# Patient Record
Sex: Female | Born: 1975 | Race: White | Hispanic: No | Marital: Single | Smoking: Current every day smoker
Health system: Southern US, Community
[De-identification: ages and names within clinical notes are randomized; demographics above are authoritative.]

## PROBLEM LIST (undated history)

## (undated) DIAGNOSIS — F32A Depression, unspecified: Secondary | ICD-10-CM

## (undated) DIAGNOSIS — F17210 Nicotine dependence, cigarettes, uncomplicated: Secondary | ICD-10-CM

## (undated) DIAGNOSIS — F329 Major depressive disorder, single episode, unspecified: Secondary | ICD-10-CM

## (undated) DIAGNOSIS — F419 Anxiety disorder, unspecified: Secondary | ICD-10-CM

## (undated) DIAGNOSIS — F319 Bipolar disorder, unspecified: Secondary | ICD-10-CM

## (undated) DIAGNOSIS — N390 Urinary tract infection, site not specified: Secondary | ICD-10-CM

## (undated) DIAGNOSIS — K529 Noninfective gastroenteritis and colitis, unspecified: Secondary | ICD-10-CM

## (undated) DIAGNOSIS — E785 Hyperlipidemia, unspecified: Secondary | ICD-10-CM

## (undated) DIAGNOSIS — B1920 Unspecified viral hepatitis C without hepatic coma: Secondary | ICD-10-CM

## (undated) DIAGNOSIS — J40 Bronchitis, not specified as acute or chronic: Secondary | ICD-10-CM

## (undated) DIAGNOSIS — J42 Unspecified chronic bronchitis: Secondary | ICD-10-CM

## (undated) DIAGNOSIS — T1491XA Suicide attempt, initial encounter: Secondary | ICD-10-CM

## (undated) DIAGNOSIS — R51 Headache: Secondary | ICD-10-CM

## (undated) DIAGNOSIS — N289 Disorder of kidney and ureter, unspecified: Secondary | ICD-10-CM

## (undated) DIAGNOSIS — N809 Endometriosis, unspecified: Secondary | ICD-10-CM

## (undated) DIAGNOSIS — K219 Gastro-esophageal reflux disease without esophagitis: Secondary | ICD-10-CM

---

## 1998-07-19 HISTORY — PX: CHOLECYSTECTOMY: SHX55

## 1998-10-29 ENCOUNTER — Inpatient Hospital Stay (HOSPITAL_COMMUNITY): Admission: AD | Admit: 1998-10-29 | Discharge: 1998-10-29 | Payer: Self-pay | Admitting: Obstetrics

## 1998-12-09 ENCOUNTER — Other Ambulatory Visit: Admission: RE | Admit: 1998-12-09 | Discharge: 1998-12-09 | Payer: Self-pay | Admitting: Obstetrics and Gynecology

## 1999-01-16 ENCOUNTER — Ambulatory Visit (HOSPITAL_COMMUNITY): Admission: RE | Admit: 1999-01-16 | Discharge: 1999-01-16 | Payer: Self-pay | Admitting: Obstetrics and Gynecology

## 1999-01-16 ENCOUNTER — Encounter: Payer: Self-pay | Admitting: Obstetrics and Gynecology

## 1999-05-31 ENCOUNTER — Inpatient Hospital Stay (HOSPITAL_COMMUNITY): Admission: AD | Admit: 1999-05-31 | Discharge: 1999-06-02 | Payer: Self-pay | Admitting: Obstetrics and Gynecology

## 1999-06-02 ENCOUNTER — Encounter: Admission: RE | Admit: 1999-06-02 | Discharge: 1999-08-31 | Payer: Self-pay | Admitting: Obstetrics and Gynecology

## 1999-06-29 ENCOUNTER — Emergency Department (HOSPITAL_COMMUNITY): Admission: EM | Admit: 1999-06-29 | Discharge: 1999-06-29 | Payer: Self-pay | Admitting: Emergency Medicine

## 1999-08-15 ENCOUNTER — Emergency Department (HOSPITAL_COMMUNITY): Admission: EM | Admit: 1999-08-15 | Discharge: 1999-08-15 | Payer: Self-pay | Admitting: Emergency Medicine

## 2002-01-14 ENCOUNTER — Emergency Department (HOSPITAL_COMMUNITY): Admission: EM | Admit: 2002-01-14 | Discharge: 2002-01-14 | Payer: Self-pay | Admitting: *Deleted

## 2004-04-06 ENCOUNTER — Other Ambulatory Visit: Admission: RE | Admit: 2004-04-06 | Discharge: 2004-04-06 | Payer: Self-pay | Admitting: Obstetrics and Gynecology

## 2004-09-24 ENCOUNTER — Inpatient Hospital Stay (HOSPITAL_COMMUNITY): Admission: AD | Admit: 2004-09-24 | Discharge: 2004-09-25 | Payer: Self-pay | Admitting: Obstetrics and Gynecology

## 2005-06-01 ENCOUNTER — Other Ambulatory Visit: Admission: RE | Admit: 2005-06-01 | Discharge: 2005-06-01 | Payer: Self-pay | Admitting: Obstetrics and Gynecology

## 2005-08-24 ENCOUNTER — Emergency Department (HOSPITAL_COMMUNITY): Admission: EM | Admit: 2005-08-24 | Discharge: 2005-08-25 | Payer: Self-pay | Admitting: Emergency Medicine

## 2005-08-30 ENCOUNTER — Ambulatory Visit (HOSPITAL_COMMUNITY): Admission: RE | Admit: 2005-08-30 | Discharge: 2005-08-31 | Payer: Self-pay | Admitting: Orthopaedic Surgery

## 2006-07-19 HISTORY — PX: WRIST SURGERY: SHX841

## 2006-08-09 ENCOUNTER — Inpatient Hospital Stay (HOSPITAL_COMMUNITY): Admission: EM | Admit: 2006-08-09 | Discharge: 2006-08-10 | Payer: Self-pay | Admitting: Emergency Medicine

## 2006-10-04 ENCOUNTER — Encounter: Payer: Self-pay | Admitting: Emergency Medicine

## 2006-10-05 ENCOUNTER — Inpatient Hospital Stay (HOSPITAL_COMMUNITY): Admission: EM | Admit: 2006-10-05 | Discharge: 2006-10-10 | Payer: Self-pay | Admitting: Psychiatry

## 2006-10-05 ENCOUNTER — Ambulatory Visit: Payer: Self-pay | Admitting: Psychiatry

## 2006-11-03 ENCOUNTER — Ambulatory Visit (HOSPITAL_COMMUNITY): Admission: RE | Admit: 2006-11-03 | Discharge: 2006-11-03 | Payer: Self-pay | Admitting: Internal Medicine

## 2006-11-23 ENCOUNTER — Inpatient Hospital Stay (HOSPITAL_COMMUNITY): Admission: RE | Admit: 2006-11-23 | Discharge: 2006-11-25 | Payer: Self-pay | Admitting: Surgery

## 2006-11-23 ENCOUNTER — Encounter (INDEPENDENT_AMBULATORY_CARE_PROVIDER_SITE_OTHER): Payer: Self-pay | Admitting: Specialist

## 2006-12-22 ENCOUNTER — Other Ambulatory Visit: Payer: Self-pay

## 2006-12-22 ENCOUNTER — Other Ambulatory Visit: Payer: Self-pay | Admitting: *Deleted

## 2006-12-23 ENCOUNTER — Inpatient Hospital Stay (HOSPITAL_COMMUNITY): Admission: AD | Admit: 2006-12-23 | Discharge: 2006-12-29 | Payer: Self-pay | Admitting: Psychiatry

## 2006-12-23 ENCOUNTER — Ambulatory Visit: Payer: Self-pay | Admitting: Psychiatry

## 2006-12-29 ENCOUNTER — Emergency Department (HOSPITAL_COMMUNITY): Admission: EM | Admit: 2006-12-29 | Discharge: 2006-12-30 | Payer: Self-pay | Admitting: Emergency Medicine

## 2007-03-24 ENCOUNTER — Emergency Department (HOSPITAL_COMMUNITY): Admission: EM | Admit: 2007-03-24 | Discharge: 2007-03-24 | Payer: Self-pay | Admitting: Family Medicine

## 2007-08-02 ENCOUNTER — Emergency Department (HOSPITAL_COMMUNITY): Admission: EM | Admit: 2007-08-02 | Discharge: 2007-08-02 | Payer: Self-pay | Admitting: Emergency Medicine

## 2007-08-29 ENCOUNTER — Encounter: Payer: Self-pay | Admitting: Emergency Medicine

## 2007-08-30 ENCOUNTER — Ambulatory Visit: Payer: Self-pay | Admitting: *Deleted

## 2007-08-30 ENCOUNTER — Inpatient Hospital Stay (HOSPITAL_COMMUNITY): Admission: AD | Admit: 2007-08-30 | Discharge: 2007-09-06 | Payer: Self-pay | Admitting: *Deleted

## 2008-01-21 ENCOUNTER — Emergency Department (HOSPITAL_COMMUNITY): Admission: EM | Admit: 2008-01-21 | Discharge: 2008-01-21 | Payer: Self-pay | Admitting: *Deleted

## 2008-02-23 ENCOUNTER — Emergency Department (HOSPITAL_COMMUNITY): Admission: EM | Admit: 2008-02-23 | Discharge: 2008-02-23 | Payer: Self-pay | Admitting: Emergency Medicine

## 2008-03-08 ENCOUNTER — Inpatient Hospital Stay (HOSPITAL_COMMUNITY): Admission: EM | Admit: 2008-03-08 | Discharge: 2008-03-10 | Payer: Self-pay | Admitting: Emergency Medicine

## 2008-03-09 ENCOUNTER — Ambulatory Visit: Payer: Self-pay | Admitting: Psychiatry

## 2008-03-10 ENCOUNTER — Inpatient Hospital Stay (HOSPITAL_COMMUNITY): Admission: EM | Admit: 2008-03-10 | Discharge: 2008-03-14 | Payer: Self-pay | Admitting: *Deleted

## 2008-03-10 ENCOUNTER — Ambulatory Visit: Payer: Self-pay | Admitting: *Deleted

## 2008-03-22 ENCOUNTER — Observation Stay (HOSPITAL_COMMUNITY): Admission: EM | Admit: 2008-03-22 | Discharge: 2008-03-24 | Payer: Self-pay | Admitting: Emergency Medicine

## 2008-08-01 ENCOUNTER — Inpatient Hospital Stay (HOSPITAL_COMMUNITY): Admission: AD | Admit: 2008-08-01 | Discharge: 2008-08-05 | Payer: Self-pay | Admitting: *Deleted

## 2008-08-01 ENCOUNTER — Ambulatory Visit: Payer: Self-pay | Admitting: *Deleted

## 2008-11-08 ENCOUNTER — Emergency Department (HOSPITAL_COMMUNITY): Admission: EM | Admit: 2008-11-08 | Discharge: 2008-11-08 | Payer: Self-pay | Admitting: Emergency Medicine

## 2008-11-09 ENCOUNTER — Emergency Department (HOSPITAL_COMMUNITY): Admission: EM | Admit: 2008-11-09 | Discharge: 2008-11-10 | Payer: Self-pay | Admitting: Emergency Medicine

## 2009-01-06 ENCOUNTER — Emergency Department (HOSPITAL_COMMUNITY): Admission: EM | Admit: 2009-01-06 | Discharge: 2009-01-06 | Payer: Self-pay | Admitting: Emergency Medicine

## 2009-05-15 ENCOUNTER — Emergency Department (HOSPITAL_COMMUNITY): Admission: EM | Admit: 2009-05-15 | Discharge: 2009-05-15 | Payer: Self-pay | Admitting: Emergency Medicine

## 2009-06-07 ENCOUNTER — Emergency Department (HOSPITAL_COMMUNITY): Admission: EM | Admit: 2009-06-07 | Discharge: 2009-06-07 | Payer: Self-pay | Admitting: Emergency Medicine

## 2009-07-10 ENCOUNTER — Emergency Department (HOSPITAL_COMMUNITY): Admission: EM | Admit: 2009-07-10 | Discharge: 2009-07-10 | Payer: Self-pay | Admitting: Emergency Medicine

## 2009-08-06 ENCOUNTER — Encounter: Payer: Self-pay | Admitting: Emergency Medicine

## 2009-08-07 ENCOUNTER — Ambulatory Visit: Payer: Self-pay | Admitting: Psychiatry

## 2009-08-07 ENCOUNTER — Inpatient Hospital Stay (HOSPITAL_COMMUNITY): Admission: RE | Admit: 2009-08-07 | Discharge: 2009-08-11 | Payer: Self-pay | Admitting: Psychiatry

## 2009-09-20 ENCOUNTER — Emergency Department (HOSPITAL_COMMUNITY): Admission: EM | Admit: 2009-09-20 | Discharge: 2009-09-20 | Payer: Self-pay | Admitting: Emergency Medicine

## 2009-09-27 ENCOUNTER — Emergency Department (HOSPITAL_COMMUNITY): Admission: EM | Admit: 2009-09-27 | Discharge: 2009-09-27 | Payer: Self-pay | Admitting: Family Medicine

## 2010-03-18 ENCOUNTER — Emergency Department (HOSPITAL_COMMUNITY): Admission: EM | Admit: 2010-03-18 | Discharge: 2010-03-18 | Payer: Self-pay | Admitting: Emergency Medicine

## 2010-05-22 ENCOUNTER — Emergency Department (HOSPITAL_COMMUNITY): Admission: EM | Admit: 2010-05-22 | Discharge: 2010-05-22 | Payer: Self-pay | Admitting: Emergency Medicine

## 2010-06-03 ENCOUNTER — Ambulatory Visit (HOSPITAL_COMMUNITY)
Admission: RE | Admit: 2010-06-03 | Discharge: 2010-06-03 | Payer: Self-pay | Source: Home / Self Care | Admitting: Psychiatry

## 2010-06-03 ENCOUNTER — Emergency Department (HOSPITAL_COMMUNITY): Admission: EM | Admit: 2010-06-03 | Discharge: 2010-06-03 | Payer: Self-pay | Admitting: Emergency Medicine

## 2010-10-02 LAB — DIFFERENTIAL
Lymphocytes Relative: 34 % (ref 12–46)
Lymphs Abs: 2.4 10*3/uL (ref 0.7–4.0)
Monocytes Relative: 7 % (ref 3–12)
Neutro Abs: 4.1 10*3/uL (ref 1.7–7.7)
Neutrophils Relative %: 56 % (ref 43–77)

## 2010-10-02 LAB — URINALYSIS, ROUTINE W REFLEX MICROSCOPIC
Bilirubin Urine: NEGATIVE
Nitrite: NEGATIVE
Specific Gravity, Urine: 1.006 (ref 1.005–1.030)
pH: 7 (ref 5.0–8.0)

## 2010-10-02 LAB — CBC
Hemoglobin: 13.4 g/dL (ref 12.0–15.0)
RBC: 4.43 MIL/uL (ref 3.87–5.11)
WBC: 7.2 10*3/uL (ref 4.0–10.5)

## 2010-10-04 LAB — PREGNANCY, URINE: Preg Test, Ur: NEGATIVE

## 2010-10-04 LAB — URINE DRUGS OF ABUSE SCREEN W ALC, ROUTINE (REF LAB)
Benzodiazepines.: POSITIVE — AB
Cocaine Metabolites: NEGATIVE
Ethyl Alcohol: <10 mg/dL (ref <10)
Phencyclidine (PCP): NEGATIVE
Propoxyphene: NEGATIVE

## 2010-10-04 LAB — URINALYSIS, ROUTINE W REFLEX MICROSCOPIC
Bilirubin Urine: NEGATIVE
Glucose, UA: NEGATIVE mg/dL
Ketones, ur: NEGATIVE mg/dL
Nitrite: NEGATIVE
Protein, ur: NEGATIVE mg/dL

## 2010-10-04 LAB — BENZODIAZEPINE, QUANTITATIVE, URINE
Alprazolam (GC/LC/MS), ur confirm: 3900 ng/mL
Flurazepam GC/MS Conf: NEGATIVE
Nordiazepam GC/MS Conf: NEGATIVE

## 2010-10-16 ENCOUNTER — Emergency Department (HOSPITAL_COMMUNITY): Payer: Self-pay

## 2010-10-16 ENCOUNTER — Emergency Department (HOSPITAL_COMMUNITY)
Admission: EM | Admit: 2010-10-16 | Discharge: 2010-10-17 | Disposition: A | Discharge status: Home / Self Care | Payer: Self-pay | Attending: Emergency Medicine | Admitting: Emergency Medicine

## 2010-10-16 DIAGNOSIS — R0602 Shortness of breath: Secondary | ICD-10-CM | POA: Insufficient documentation

## 2010-10-16 DIAGNOSIS — F341 Dysthymic disorder: Secondary | ICD-10-CM | POA: Insufficient documentation

## 2010-10-16 DIAGNOSIS — R45851 Suicidal ideations: Secondary | ICD-10-CM | POA: Insufficient documentation

## 2010-10-16 DIAGNOSIS — R079 Chest pain, unspecified: Secondary | ICD-10-CM | POA: Insufficient documentation

## 2010-10-16 DIAGNOSIS — I10 Essential (primary) hypertension: Secondary | ICD-10-CM | POA: Insufficient documentation

## 2010-10-16 DIAGNOSIS — F411 Generalized anxiety disorder: Secondary | ICD-10-CM | POA: Insufficient documentation

## 2010-10-16 DIAGNOSIS — R000 Tachycardia, unspecified: Secondary | ICD-10-CM | POA: Insufficient documentation

## 2010-10-16 DIAGNOSIS — R209 Unspecified disturbances of skin sensation: Secondary | ICD-10-CM | POA: Insufficient documentation

## 2010-10-16 DIAGNOSIS — R109 Unspecified abdominal pain: Secondary | ICD-10-CM | POA: Insufficient documentation

## 2010-10-16 LAB — RAPID URINE DRUG SCREEN, HOSP PERFORMED
Barbiturates: NOT DETECTED
Cocaine: NOT DETECTED
Opiates: NOT DETECTED
Tetrahydrocannabinol: NOT DETECTED

## 2010-10-16 LAB — URINALYSIS, ROUTINE W REFLEX MICROSCOPIC
Bilirubin Urine: NEGATIVE
Glucose, UA: NEGATIVE mg/dL
Ketones, ur: NEGATIVE mg/dL
Leukocytes, UA: NEGATIVE
Protein, ur: NEGATIVE mg/dL
pH: 6 (ref 5.0–8.0)

## 2010-10-16 LAB — COMPREHENSIVE METABOLIC PANEL
ALT: 32 U/L (ref 0–35)
AST: 28 U/L (ref 0–37)
Alkaline Phosphatase: 73 U/L (ref 39–117)
CO2: 25 mEq/L (ref 19–32)
Calcium: 9.5 mg/dL (ref 8.4–10.5)
GFR calc Af Amer: >60 mL/min (ref >60)
GFR calc non Af Amer: >60 mL/min (ref >60)
Glucose, Bld: 91 mg/dL (ref 70–99)
Potassium: 3.3 mEq/L — ABNORMAL LOW (ref 3.5–5.1)
Sodium: 139 mEq/L (ref 135–145)

## 2010-10-16 LAB — DIFFERENTIAL
Basophils Relative: 1 % (ref 0–1)
Lymphs Abs: 3.4 10*3/uL (ref 0.7–4.0)
Monocytes Absolute: 0.7 10*3/uL (ref 0.1–1.0)
Monocytes Relative: 7 % (ref 3–12)
Neutro Abs: 5.4 10*3/uL (ref 1.7–7.7)
Neutrophils Relative %: 55 % (ref 43–77)

## 2010-10-16 LAB — ETHANOL: Alcohol, Ethyl (B): <5 mg/dL (ref 0–10)

## 2010-10-16 LAB — CBC
Hemoglobin: 14.6 g/dL (ref 12.0–15.0)
MCH: 29.6 pg (ref 26.0–34.0)
MCHC: 35.4 g/dL (ref 30.0–36.0)
MCV: 83.4 fL (ref 78.0–100.0)
RBC: 4.94 MIL/uL (ref 3.87–5.11)

## 2010-10-16 LAB — URINE MICROSCOPIC-ADD ON

## 2010-10-17 ENCOUNTER — Emergency Department (HOSPITAL_COMMUNITY)
Admission: EM | Admit: 2010-10-17 | Discharge: 2010-10-18 | Disposition: A | Discharge status: Other Acute Inpatient Hospital | Payer: Self-pay | Attending: Emergency Medicine | Admitting: Emergency Medicine

## 2010-10-17 DIAGNOSIS — F329 Major depressive disorder, single episode, unspecified: Secondary | ICD-10-CM | POA: Insufficient documentation

## 2010-10-17 DIAGNOSIS — R000 Tachycardia, unspecified: Secondary | ICD-10-CM | POA: Insufficient documentation

## 2010-10-17 DIAGNOSIS — R45851 Suicidal ideations: Secondary | ICD-10-CM | POA: Insufficient documentation

## 2010-10-17 DIAGNOSIS — F3289 Other specified depressive episodes: Secondary | ICD-10-CM | POA: Insufficient documentation

## 2010-10-17 LAB — URINALYSIS, ROUTINE W REFLEX MICROSCOPIC
Bilirubin Urine: NEGATIVE
Ketones, ur: NEGATIVE mg/dL
Leukocytes, UA: NEGATIVE
Nitrite: NEGATIVE
Specific Gravity, Urine: 1.018 (ref 1.005–1.030)
Urobilinogen, UA: 0.2 mg/dL (ref 0.0–1.0)
pH: 5.5 (ref 5.0–8.0)

## 2010-10-17 LAB — CBC
HCT: 39.5 % (ref 36.0–46.0)
Hemoglobin: 13.3 g/dL (ref 12.0–15.0)
MCH: 29 pg (ref 26.0–34.0)
MCHC: 33.7 g/dL (ref 30.0–36.0)
MCV: 86.1 fL (ref 78.0–100.0)
RBC: 4.59 MIL/uL (ref 3.87–5.11)

## 2010-10-17 LAB — RAPID URINE DRUG SCREEN, HOSP PERFORMED
Amphetamines: NOT DETECTED
Cocaine: NOT DETECTED
Opiates: POSITIVE — AB
Tetrahydrocannabinol: NOT DETECTED

## 2010-10-17 LAB — COMPREHENSIVE METABOLIC PANEL
Albumin: 3.8 g/dL (ref 3.5–5.2)
Alkaline Phosphatase: 57 U/L (ref 39–117)
BUN: 5 mg/dL — ABNORMAL LOW (ref 6–23)
CO2: 26 mEq/L (ref 19–32)
Chloride: 104 mEq/L (ref 96–112)
Creatinine, Ser: 0.71 mg/dL (ref 0.4–1.2)
GFR calc non Af Amer: >60 mL/min (ref >60)
Glucose, Bld: 102 mg/dL — ABNORMAL HIGH (ref 70–99)
Potassium: 2.9 mEq/L — ABNORMAL LOW (ref 3.5–5.1)
Total Bilirubin: 0.5 mg/dL (ref 0.3–1.2)

## 2010-10-17 LAB — PHENYTOIN LEVEL, TOTAL: Phenytoin Lvl: <2.5 ug/mL — ABNORMAL LOW (ref 10.0–20.0)

## 2010-10-17 LAB — URINE MICROSCOPIC-ADD ON

## 2010-10-17 LAB — DIFFERENTIAL
Lymphs Abs: 2.9 10*3/uL (ref 0.7–4.0)
Monocytes Absolute: 0.6 10*3/uL (ref 0.1–1.0)
Monocytes Relative: 7 % (ref 3–12)
Neutro Abs: 5 10*3/uL (ref 1.7–7.7)
Neutrophils Relative %: 57 % (ref 43–77)

## 2010-10-17 LAB — ACETAMINOPHEN LEVEL: Acetaminophen (Tylenol), Serum: <10 ug/mL — ABNORMAL LOW (ref 10–30)

## 2010-10-18 ENCOUNTER — Inpatient Hospital Stay (HOSPITAL_COMMUNITY)
Admission: EM | Admit: 2010-10-18 | Discharge: 2010-10-21 | DRG: 885 | Disposition: A | Discharge status: Home / Self Care | Payer: PRIVATE HEALTH INSURANCE | Source: Intra-hospital | Attending: Psychiatry | Admitting: Psychiatry

## 2010-10-18 DIAGNOSIS — Z560 Unemployment, unspecified: Secondary | ICD-10-CM

## 2010-10-18 DIAGNOSIS — F1910 Other psychoactive substance abuse, uncomplicated: Secondary | ICD-10-CM

## 2010-10-18 DIAGNOSIS — Z91199 Patient's noncompliance with other medical treatment and regimen due to unspecified reason: Secondary | ICD-10-CM

## 2010-10-18 DIAGNOSIS — F39 Unspecified mood [affective] disorder: Principal | ICD-10-CM

## 2010-10-18 DIAGNOSIS — T50992A Poisoning by other drugs, medicaments and biological substances, intentional self-harm, initial encounter: Secondary | ICD-10-CM

## 2010-10-18 DIAGNOSIS — T420X1A Poisoning by hydantoin derivatives, accidental (unintentional), initial encounter: Secondary | ICD-10-CM

## 2010-10-18 DIAGNOSIS — Z9119 Patient's noncompliance with other medical treatment and regimen: Secondary | ICD-10-CM

## 2010-10-19 DIAGNOSIS — F39 Unspecified mood [affective] disorder: Secondary | ICD-10-CM

## 2010-10-19 LAB — BASIC METABOLIC PANEL
BUN: 5 mg/dL — ABNORMAL LOW (ref 6–23)
Calcium: 8.9 mg/dL (ref 8.4–10.5)
Chloride: 108 mEq/L (ref 96–112)
Creatinine, Ser: 0.64 mg/dL (ref 0.4–1.2)

## 2010-10-21 LAB — COMPREHENSIVE METABOLIC PANEL
ALT: 38 U/L — ABNORMAL HIGH (ref 0–35)
AST: 28 U/L (ref 0–37)
Alkaline Phosphatase: 62 U/L (ref 39–117)
CO2: 26 mEq/L (ref 19–32)
Chloride: 106 mEq/L (ref 96–112)
GFR calc Af Amer: >60 mL/min (ref >60)
GFR calc non Af Amer: >60 mL/min (ref >60)
Sodium: 138 mEq/L (ref 135–145)
Total Bilirubin: 0.4 mg/dL (ref 0.3–1.2)

## 2010-10-21 LAB — CBC
RBC: 4.59 MIL/uL (ref 3.87–5.11)
WBC: 6 10*3/uL (ref 4.0–10.5)

## 2010-10-21 LAB — DIFFERENTIAL
Basophils Absolute: 0.1 10*3/uL (ref 0.0–0.1)
Eosinophils Absolute: 0.3 10*3/uL (ref 0.0–0.7)
Eosinophils Relative: 5 % (ref 0–5)

## 2010-10-21 LAB — URINALYSIS, ROUTINE W REFLEX MICROSCOPIC
Glucose, UA: NEGATIVE mg/dL
Hgb urine dipstick: NEGATIVE
Ketones, ur: NEGATIVE mg/dL
Protein, ur: NEGATIVE mg/dL
Urobilinogen, UA: 0.2 mg/dL (ref 0.0–1.0)

## 2010-10-21 LAB — PREGNANCY, URINE: Preg Test, Ur: NEGATIVE

## 2010-10-21 LAB — RAPID URINE DRUG SCREEN, HOSP PERFORMED
Amphetamines: NOT DETECTED
Benzodiazepines: POSITIVE — AB

## 2010-10-22 LAB — DIFFERENTIAL
Basophils Absolute: 0 10*3/uL (ref 0.0–0.1)
Basophils Relative: 1 % (ref 0–1)
Eosinophils Absolute: 0.2 10*3/uL (ref 0.0–0.7)
Lymphs Abs: 2.7 10*3/uL (ref 0.7–4.0)
Neutrophils Relative %: 44 % (ref 43–77)

## 2010-10-22 LAB — BASIC METABOLIC PANEL
BUN: 6 mg/dL (ref 6–23)
Chloride: 111 mEq/L (ref 96–112)
Creatinine, Ser: 0.69 mg/dL (ref 0.4–1.2)

## 2010-10-22 LAB — URINALYSIS, ROUTINE W REFLEX MICROSCOPIC: Glucose, UA: NEGATIVE mg/dL

## 2010-10-22 LAB — URINE MICROSCOPIC-ADD ON

## 2010-10-22 LAB — CBC
MCV: 90.3 fL (ref 78.0–100.0)
Platelets: 304 10*3/uL (ref 150–400)
WBC: 6.3 10*3/uL (ref 4.0–10.5)

## 2010-10-22 LAB — POCT PREGNANCY, URINE: Preg Test, Ur: NEGATIVE

## 2010-10-26 NOTE — H&P (Signed)
  [name], [name] NO.:  [id number]  MEDICAL RECORD NO.:  [id number]           PATIENT TYPE:  I  LOCATION:  0303                          [facility]ACILITY[facility]  [facility]  PHYSICIAN:  Anselm Jungling, MD  DATE OF BIRTH:  08/27/1975  DATE OF ADMISSION:  10/18/2010 DATE OF DISCHARGE:                      PSYCHIATRIC ADMISSION ASSESSMENT   PATIENT IDENTIFICATION:  This is a 35 year old female voluntary admitted on October 17, 2000.  HISTORY OF PRESENT ILLNESS:  The patient is here after an overdose on Dilantin.  Did it with the intent to kill herself.  She has been feeling very depressed, has been off her medications for approximately 4 months. Was not unable to get the help that she felt she needed.  The patient had a fight with her boyfriend that triggered the overdose.  PAST PSYCHIATRIC HISTORY:  This is the patient's third admission.  She was here back in January 2011 for depression and anxiety.  The patient also had an admission back in January 2010.  The patient has been noncompliant with her medications for the past several months and her listed psychiatrist is Dr. Betti Cruz.  SOCIAL HISTORY:  The patient has been in a relationship for the past 2 years.  She has 2 children.  Completed twelfth grade.  She is currently unemployed.  FAMILY HISTORY:  None.  ALCOHOL AND DRUG HISTORY:  Urine drug screen is positive for benzodiazepines, positive for opiates.  PRIMARY CARE PROVIDER:  None.  MEDICATIONS:  The patient has been noncompliant for approximately 4 months.  PHYSICAL EXAMINATION:  Physical exam was reviewed.  No significant findings.  She was well-developed, well-nourished, well-hydrated in no distress.  Her initial potassium is 2.9.  The patient received 40 mEq of potassium chloride.  Urine drug screen positive for benzodiazepines, positive for THC.  Dilantin level was less than 2.5.  MENTAL STATUS EXAM:  The patient is fully alert, cooperative, up and active in  the milieu with good participation.  She was opened to having boyfriend involved and expressed interest in substance use program. Thought processes are coherent, goal directed.  DISCHARGE DIAGNOSES:  AXIS I:  Mood disorder NOS, polysubstance abuse. AXIS II:  Deferred. AXIS III:  No known medical conditions. AXIS IV:  Problems with her boyfriend. AXIS V:  Current is 40.  PLAN:  Our plan is to admit the patient to the adult milieu.  Will have Ambien available for sleep.  Address her substance use.  Have family involvement assess her motivation for rehab.  Tentative length of stay is 3-4 days.     Landry Corporal, N.P.   ______________________________ Anselm Jungling, MD    JO/MEDQ  D:  10/20/2010  T:  10/20/2010  Job:  [id number]  Electronically Signed by Limmie PatriciaP. on 10/22/2010 10:44:01 AM Electronically Signed by Geralyn Flash MD on 10/26/2010 11:29:50 AM

## 2010-10-28 LAB — BASIC METABOLIC PANEL
CO2: 28 mEq/L (ref 19–32)
Chloride: 102 mEq/L (ref 96–112)
GFR calc Af Amer: >60 mL/min (ref >60)
Glucose, Bld: 75 mg/dL (ref 70–99)
Potassium: 3 mEq/L — ABNORMAL LOW (ref 3.5–5.1)
Sodium: 137 mEq/L (ref 135–145)

## 2010-10-28 LAB — DIFFERENTIAL
Basophils Absolute: 0.1 10*3/uL (ref 0.0–0.1)
Basophils Relative: 2 % — ABNORMAL HIGH (ref 0–1)
Eosinophils Relative: 2 % (ref 0–5)
Monocytes Absolute: 0.5 10*3/uL (ref 0.1–1.0)
Monocytes Relative: 8 % (ref 3–12)
Neutro Abs: 2.3 10*3/uL (ref 1.7–7.7)

## 2010-10-28 LAB — RAPID URINE DRUG SCREEN, HOSP PERFORMED
Barbiturates: NOT DETECTED
Benzodiazepines: POSITIVE — AB
Cocaine: NOT DETECTED
Opiates: NOT DETECTED

## 2010-10-28 LAB — CBC
HCT: 37.2 % (ref 36.0–46.0)
Hemoglobin: 12.9 g/dL (ref 12.0–15.0)
MCHC: 34.6 g/dL (ref 30.0–36.0)
RBC: 4.2 MIL/uL (ref 3.87–5.11)
RDW: 14.5 % (ref 11.5–15.5)

## 2010-11-02 LAB — URINE DRUGS OF ABUSE SCREEN W ALC, ROUTINE (REF LAB)
Amphetamine Screen, Ur: NEGATIVE
Barbiturate Quant, Ur: NEGATIVE
Benzodiazepines.: POSITIVE — AB
Cocaine Metabolites: NEGATIVE
Creatinine,U: 117.8 mg/dL
Methadone: NEGATIVE
Opiate Screen, Urine: NEGATIVE

## 2010-11-02 LAB — COMPREHENSIVE METABOLIC PANEL
Albumin: 3.4 g/dL — ABNORMAL LOW (ref 3.5–5.2)
Alkaline Phosphatase: 51 U/L (ref 39–117)
BUN: 8 mg/dL (ref 6–23)
CO2: 24 mEq/L (ref 19–32)
Chloride: 107 mEq/L (ref 96–112)
Creatinine, Ser: 0.65 mg/dL (ref 0.4–1.2)
GFR calc non Af Amer: >60 mL/min (ref >60)
Glucose, Bld: 101 mg/dL — ABNORMAL HIGH (ref 70–99)
Potassium: 3.7 mEq/L (ref 3.5–5.1)
Total Bilirubin: 0.5 mg/dL (ref 0.3–1.2)

## 2010-11-02 LAB — URINE MICROSCOPIC-ADD ON

## 2010-11-02 LAB — URINALYSIS, ROUTINE W REFLEX MICROSCOPIC
Protein, ur: NEGATIVE mg/dL
Specific Gravity, Urine: 1.019 (ref 1.005–1.030)
Urobilinogen, UA: 0.2 mg/dL (ref 0.0–1.0)

## 2010-11-02 LAB — BENZODIAZEPINE, QUANTITATIVE, URINE
Nordiazepam GC/MS Conf: NEGATIVE
Oxazepam GC/MS Conf: NEGATIVE

## 2010-12-01 NOTE — Discharge Summary (Signed)
[name], [name]                ACCOUNT NO.:  [id number]   MEDICAL RECORD NO.:  [id number]          PATIENT TYPE:  INP   LOCATION:  1517                         FACILITY:  [facility]   PHYSICIAN:  Lonia Blood, M.D.       DATE OF BIRTH:  1976-05-28   DATE OF ADMISSION:  03/08/2008  DATE OF DISCHARGE:  03/10/2008                               DISCHARGE SUMMARY   DISCHARGE DIAGNOSES:  1. Seroquel overdose - resolved.  2. Suicide attempt.  3. Depression.  4. Gastroesophageal reflux disease.  5. Chronic headaches.   DISCHARGE MEDICATIONS:  1. Klonopin 1 mg by mouth every 8 hours.  2. Prozac 40 mg by mouth each morning.  3. Nicotine patch 21 mg daily.  4. Protonix 40 mg daily.  5. Ativan as needed for anxiety   CONDITION ON DISCHARGE:  [name] is transferred to [facility] on a voluntary basis for inpatient psychiatric admission.   CONSULTATIONS THIS ADMISSION:  The patient was seen in consultation by  Dr. Geralyn Flash.   PROCEDURES THIS ADMISSION:  No procedures done.   HISTORY AND PHYSICAL:  Refer to the dictated H&P done by Dr. Toniann Fail  on March 08, 2008.   HOSPITAL COURSE.:  1. Seroquel overdose.  [name] was admitted to step-down unit at      [facility] for altered mental status and sedation      following a Seroquel overdose.  The patient was kept on telemetry      and she had monitoring of her EKG without any arrhythmias or QT      prolongation seen.  The patient was taken off of Seroquel and she      was seen by the consultant psychiatrist on hospital day #1.  He      felt that the patient should undergo voluntary admission for      further counseling and exploring other options for mood      stabilization and to help her sleep.  [name] is getting      transferred today March 10, 2008 to [facility] for that      purpose.  2. Mild hypokalemia that got repleted in the hospital in the emergency      room and subsequent  magnesium and potassium levels are within      normal limits.     Lonia Blood, M.D.  Electronically Signed    SL/MEDQ  D:  03/10/2008  T:  03/10/2008  Job:  [id number]

## 2010-12-01 NOTE — H&P (Signed)
NAMEALIVIAH, SPAIN NO.:  [id number]   MEDICAL RECORD NO.:  [id number]          PATIENT TYPE:  IPS   LOCATION:  0505                          FACILITY:  [facility]   PHYSICIAN:  Geoffery Lyons, M.D.      DATE OF BIRTH:  1976/03/15   DATE OF ADMISSION:  12/23/2006  DATE OF DISCHARGE:                       PSYCHIATRIC ADMISSION ASSESSMENT   IDENTIFYING INFORMATION:  This is a 35 year old single white female  voluntarily admitted on December 23, 2006.   HISTORY OF PRESENT ILLNESS:  The patient presents here with complaints  of depression, anxiety and suicidal thoughts.  No specific plan.  Reports she has been doing some recent drinking.  States she only drinks  once a week.  Her last drink was yesterday, had two beers.  Denies any  drug use.  She also reports auditory hallucinations at times.  The  patient's stressors are she has court dates upcoming for shoplifting  charges and a DUI from January where she had her 24-year-old in the car  with her.   PAST PSYCHIATRIC HISTORY:  The patient is here in March of 2008 with  self-inflicted injuries where she cut her wrist with broken coffee cup.  She does have a Veterinary surgeon in [place].  Her psychiatric medications are  prescribed by her family medical doctor.   SOCIAL HISTORY:  This is a 35 year old separated white female, has two  children, ages 2 and 90, and children are with their father of those  children.  The patient lives with her mother and father for  approximately a year.  She is unemployed.   FAMILY HISTORY:  None.   ALCOHOL/DRUG HISTORY:  She smokes.  Alcohol and drug habits are  described above.  Again, denies any substance abuse.   PRIMARY CARE PHYSICIAN:  Primary care Bryceson Grape is Foye Deer at  [facility].   MEDICAL PROBLEMS:  The patient had her gallbladder removed in May 7.  Denies any other acute or chronic health problems.   MEDICATIONS:  Has been on Prozac 40 mg daily, Klonopin 1 mg  b.i.d.,  trazodone 200 mg at bedtime, Risperdal 2 mg at bedtime.  Reports  compliance with her medications.   ALLERGIES:  CODEINE (problems with itching).   PHYSICAL EXAMINATION:  The patient was fully assessed at [facility]  Emergency Department.  This is a young female complaining of anxiety.  Does not appear in any acute distress.  Temperature is 97.6, heart rate  85, respirations 16, blood pressure is 98/62.  Height is 62 inches with  a weight of 163 pounds.   LABORATORY DATA:  Her hemoglobin 11.8, hematocrit 36.1.  Alcohol level  was 87.  Liver enzymes are within normal limits.  Creatinine of 0.7, RDW  19.3.   REVIEW OF SYSTEMS:  No fever, no chills, no changes in appetite, no  insomnia.  No chest pain, no shortness of breath.  No nausea, no  vomiting.  No dysuria.  No falls.  No headaches.  No seizures.  Positive  for depression and positive for alcohol use.   MENTAL STATUS EXAM:  She is fully  alert.  She has good eye contact.  She  is casually dressed.  Her speech is clear, normal pace and tone.  The  patient's mood is anxious.  The patient is restricted.  Thought  processes endorsing auditory hallucinations.  No current suicidal or  homicidal thoughts.  No delusional thinking.  Cognitive function intact.  Memory is good.  Judgment is good.  Her insight is minimal.   DIAGNOSES:  AXIS I:  Depressive disorder not otherwise specified.  Alcohol abuse; rule out dependence.  AXIS II:  Deferred.  AXIS III:  No acute or chronic health issues.  AXIS IV:  Problems related to legal system and other psychosocial  problems.  AXIS V:  Current 35-40.   PLAN:  Contract for safety.  Stabilize mood and thinking.  Will resume  her Prozac and trazodone.  We will discontinue her Klonopin.  The  patient will be put on the Librium protocol which was discussed with the  patient.  The patient is concerned about her anxiety and the patient is  requesting Seroquel which has worked well for her in  the past.  Consider  family session with her parents for any concerns and return to prior  living arrangements.  The patient is to increase her coping skills.   TENTATIVE LENGTH OF STAY:  Four to six days.      Landry Corporal, N.P.      Geoffery Lyons, M.D.  Electronically Signed    JO/MEDQ  D:  12/23/2006  T:  12/23/2006  Job:  [id number]

## 2010-12-01 NOTE — H&P (Signed)
[name], [name]                ACCOUNT NO.:  [id number]   MEDICAL RECORD NO.:  [id number]        PATIENT TYPE:  BIPS   LOCATION:  301                           FACILITY:  [facility]   PHYSICIAN:  Jasmine Pang, M.D. DATE OF BIRTH:  11/27/1975   DATE OF ADMISSION:  03/10/2008  DATE OF DISCHARGE:                       PSYCHIATRIC ADMISSION ASSESSMENT   IDENTIFYING INFORMATION:  This is a 35 year old female who was  voluntarily admitted on 03/10/2008.   HISTORY OF PRESENT ILLNESS:  The patient presents with a history of  intentional overdose taking a bunch of pills.  On Friday, took 20  Seroquel 300 mg tablets, reporting having panic attacks.  She thought  her children were better off without her.  Her sister had found out and  taken the patient to the emergency department.  The patient denies any  specific stressors other than reporting a history of anxiety.  Her sleep  has been decreased.  Appetite has been satisfactory.  Denies any alcohol  or drug use.   PAST PSYCHIATRIC HISTORY:  The patient was here in March.  No history of  any prior suicide attempt and reports no current mental health  treatment.  Did have a follow-up with mental health but went to her  primary care Wilbur [place] for her psychotropic medications.   SOCIAL HISTORY:  A 35 year old separated female who has two children  ages 34 and three.  Children are currently with her parents and her  sister.  The patient lives in [place] and lives with her sister.   FAMILY HISTORY:  Problems with alcohol, anxiety, depression.  The  patient again denies any alcohol or drug use.  Primary care Kaydn Kumpf is  unclear.  Medical problems.  Denies any chronic medical issues.   MEDICATIONS:  1. Prozac 40 mg daily.  2. Seroquel 300 mg at bedtime.  3. Xanax 1 mg q.i.d.  4. Birth control pills daily.   DRUG ALLERGIES:  No known allergies.   PHYSICAL EXAMINATION:  GENERAL:  This is an overweight female who was  medically stable  at [facility] after her overdose.  Spent  approximately 3 days.  VITAL SIGNS:  Temperature 97.9, 75 heart rate, 60 respirations, blood  pressure is 119/82.  She is 5 feet 3 inches tall, 198 pounds.   LABORATORY DATA:  Shows a magnesium of 2.1, B-met within normal limits.  Urinalysis is negative.  Pregnancy test is negative.  Alcohol level less  than 5.  TSH is 2.732.  CBC within normal limits.  Acetaminophen level  less than 10 and urine drug screen is positive for benzodiazepines.   MENTAL STATUS EXAM:  This is a fully alert, cooperative female,  appropriately dressed, fair eye contact, somewhat vague in her answers.  Somewhat guarded.  Speech is clear, normal pace and tone.  The patient's  mood is nervous.  The patient affect is some mild anxiety.  Thought  process are coherent.  No delusional statements.  No evidence of any  psychotic symptoms.  Cognitive function intact.  Memory appears to be  good.  Judgment insight is good.   DIAGNOSES:  AXIS  I:  Bipolar disorder NOS.  AXIS II:  Deferred.  No known medical conditions.  AXIS IV:  Problems with primary support group with recent separation.  Other psychosocial problems.  AXIS V:  Current is 35.   PLAN:  Contract for safety.  Stabilize mood.  I think we will resume her  Prozac.  Have Ativan available on a p.r.n. basis.  We will do an EKG at  this time as the patient is complaining of feeling like her heart is  racing.  We will also add Abilify at bedtime to augment her Prozac.  Will also continue to identify her support.  The patient may benefit  from some individual therapy and case manager will obtain her follow-up.  Her tentative length of stay at this time is 3-5 days.      Landry Corporal, N.P.      Jasmine Pang, M.D.  Electronically Signed    JO/MEDQ  D:  03/13/2008  T:  03/13/2008  Job:  [id number]

## 2010-12-01 NOTE — H&P (Signed)
NAMELEANI, MYRON NO.:  [id number]   MEDICAL RECORD NO.:  [id number]          PATIENT TYPE:  IPS   LOCATION:  0305                          [facility]ACILITY:  [facility]   PHYSICIAN:  Jasmine Pang, M.D. DATE OF BIRTH:  [birth year]   DATE OF ADMISSION:  08/30/2007  DATE OF DISCHARGE:                       PSYCHIATRIC ADMISSION ASSESSMENT   REASON FOR ADMISSION:  This is a 35 year old female voluntarily admitted  August 30, 2007.   HISTORY OF PRESENT ILLNESS:  The patient presents with a history of  depression and suicidal thoughts.  States that she has had the worst  panic attack ever.  She was with her child.  She was also experiencing  some passive suicidal thoughts.  Feeling that not wanting to be here,  but had no active plan to harm herself.  She reports a history of panic  attacks, anxiety, crying at times.  Her sleep has been decreased,  sleeping only 2 hours nightly with report of a decreased appetite.  Stressors are her living situation.  She lives in a double-wide with her  parents and other family members.  The patient finds herself more or  less confined to one bedroom in the trailer.  She also states although  her husband has custody of the children,  the children live with her,  but he will come and take them whenever he feels like.   PAST PSYCHIATRIC HISTORY:  The patient was here in the past.  She  believes this may be her third admission here.  She was here in May  2008.  She was at [facility] in the past.  She has no current outpatient  treatment.  She was in therapy in the past.   SOCIAL HISTORY:  The patient is separated from her husband.  She has two  children ages 54 and 42.  She lives with her parents, her own sister with  her sister's boyfriend and states her sister is currently pregnant.  The  patient has a twelfth grade education.  She is unemployed.  She has  legal charges pending for assaulting an Technical sales engineer and larceny.   FAMILY HISTORY:  Aunts  and uncle with alcohol problems.   ALCOHOL/DRUG HISTORY:  Smokes 2 packs a day.  She has no alcohol or drug  use.   PRIMARY CARE Markis Langland:  Dr. Tomasa Blase at [facility].   PAST MEDICAL HISTORY:  1. Recently reports a history of a fractured elbow and has an      appointment with [facility].  2. Also headaches.  3. GERD symptoms.   MEDICATIONS:  1. Klonopin 1 mg t.i.d.  2. Prozac 40.  3. Ambien 10 for sleep.   DRUG ALLERGIES:  NO KNOWN DRUG ALLERGIES.   PHYSICAL EXAMINATION:  GENERAL:  This is a young female in no acute  distress.  She was sent to the emergency room for complaints of  headache.  She received Benadryl 25 mg, Compazine 10 and Toradol 30.  VITAL SIGNS:  Temperature is 96.9, 93 heart rate, 20 respirations, blood  pressure is 112/79, 5 feet 3 inches tall, 188 pounds.  GENERAL:  She has tattoos noted per nurse and skin assessment on her  ankles and lower back.   LABORATORY DATA:  Her urine drug screen is positive for benzodiazepines,  glucose of 117.  Alcohol level less than 5.  WBC count is 11.9.   DIAGNOSTICS:  CAT scan was normal.   MENTAL STATUS EXAM:  She is fully alert, cooperative, good eye contact.  Her speech is clear, normal pace and tone.  The patient's affect is  flat.  Thought processes are coherent.  No evidence of any thought  disorder.  Cognitive function intact.  Her memory is good.  Judgment and  insight fair.   AXIS I:  Depressive disorder with general anxiety disorder.  AXIS II:  Deferred.  AXIS III:  She has headaches, gastroesophageal reflux disease and  fractured right elbow.  AXIS IV:  Psychosocial problems, medical problems, also having problems  with transportation and problems with housing.  AXIS V:  Current is 40.   PLAN:  Contracts for safety.  Stabilize mood and thinking.  We will  continue with her Prozac at 40 as the patient states with an increased  dose, she had problems with tremor.  We will add  Midrin for her  complaint of headache.  The patient states that works well for her.  We  will consider Abilify.  The patient was requesting to have her anxiety  medicine changed to Ativan and that will be considered at this time.  We  will continue with the Klonopin.  The patient will need some individual  therapy.  The patient may have a repeat dose of her Ambien to help her  sleep.  Tentative length of stay is 3-5 days.      Landry Corporal, N.P.      Jasmine Pang, M.D.  Electronically Signed    JO/MEDQ  D:  08/30/2007  T:  08/31/2007  Job:  [id number]

## 2010-12-01 NOTE — Discharge Summary (Signed)
[name], HEATHCOCK NO.:  [id number]   MEDICAL RECORD NO.:  [id number]          PATIENT TYPE:  OBV   LOCATION:  1528                         [facility]AC[facility]  [facility]   PHYSICIAN:  Isidor Holts, M.D.  DATE OF BIRTH:  26-Jul-1975   DATE OF ADMISSION:  03/22/2008  DATE OF DISCHARGE:                               DISCHARGE SUMMARY   PRIMARY MEDICAL DOCTOR:  Dr. Foye Deer at [facility] in [place], [place].   PRIMARY PSYCHIATRIST:  Dr. Milford Cage.   DISCHARGE DIAGNOSES:  1. Allergic reaction/angioedema.  2. Mild dystonic reaction.  3. History of anxiety/depression.  4. Obesity.   DISCHARGE MEDICATIONS:  1. Ativan 1 mg p.o. q.i.d.  2. Oral contraceptive medication.  Continue in pre-admission regimen.  3. Ambien 10 mg p.o. p.r.n. nightly.  4. Pepcid 20 mg p.o. b.i.d. for 5 days only.  5. Claritin 10 mg p.o. daily for 5 days only.  6. Prednisone 20 mg p.o. daily for 5 days only.   PROCEDURES:  Chest x-ray dated 03/22/2008.  This showed no acute  cardiopulmonary process.   CONSULTATIONS:  None.  However, telephone discussion was held with Dr.  Milford Cage psychiatrist regarding the patient's psychotropic  medications on 03/23/2008.   ADMISSION HISTORY:  As in H&P notes of 03/22/2008, dictated by Dr.  Della Goo.  However in brief, this is a 35 year old female, with  known history of mood disorder, depression and panic attacks, previous  cholecystectomy, previous right knee surgery, status post  hospitalization in the [facility] 02/2008 for Seroquel  overdose, presenting with progressive tongue swelling as well as  involuntary arching of her back in the a.m. of 03/22/2008.  Reportedly,  the patient had been commenced on Celexa and Abilify at the time of  discharge from [facility] unit in late August 2009, and had  tolerated these medicines quite well.  The patient was admitted for  further evaluation,  investigation and management.   CLINICAL COURSE:  1. Allergic reaction/angioedema.  The patient presents with tongue      swelling, clinically consistent with angioedema, but however, no      respiratory distress, although she had mild dysarthria.  She was      commenced on intravenous antihistaminics.  She had no shortness of      breath.  Chest x-ray was unremarkable and she had no dysphagia.  By      03/22/2008 in a.m. angioedema had resolved, and the patient had no      further dysarthria.  She was therefore transitioned to oral      antihistaminics without any deleterious effects.  She was noted      however in a.m. of 03/24/2008 to have a nonpruritic erythematous      rash involving her neck and upper torso anteriorly and posteriorly.      The patient has been placed on a combination of antihistaminics,      Prednisone and H2RA for a further 5-day course.  She has been      recommended to discontinue Celexa, until reevaluated by her primary  M.D. and/or psychiatrist.   1. Dystonic reaction.  The patient appears to have experienced mild      dystonic reaction, as evidenced by involuntary arching of her back.      This responded to intravenous Benadryl and the patient had no      recurrences, thereafter.  Following review of her medications, this      was felt to be likely secondary to Abilify.  I had a detailed      discussion with Dr. Milford Cage, the patient's psychiatrist, via      telephone on 03/23/2008, and she has recommended holding Abilify      until the patient sees her, following discharge.  The patient is      recommended to call for an appointment as soon as possible.   1. Mood disorder depression.  The patient's mood remained stable,      during the course of this hospitalization.   DISPOSITION:  The patient was on 03/24/2008, considered clinically  stable for discharge.  She was therefore, discharged accordingly.   DIET:  No restrictions.   ACTIVITY:  No  restrictions.   FOLLOWUP INSTRUCTIONS:  The patient is recommended to follow up with her  primary M.D., Dr. Foye Deer, [facility], in  [place], [place] in the coming week.  She is to call for an  appointment.  In addition, she is been recommended to follow up with Dr.  Milford Cage her psychiatrist, as soon as possible, following  discharge.  She is also to call for an appointment.  All this has been  communicated to the patient, and she has verbalized understanding.   SPECIAL INSTRUCTIONS:  Celexa and Abilify are on hold, until reevaluated  by the patient's primary M.D. and/or psychiatrist.      Isidor Holts, M.D.  Electronically Signed     CO/MEDQ  D:  03/24/2008  T:  03/24/2008  Job:  [id number]   cc:   Dr. Foye Deer  [facility]  [place], [place]   Jasmine Pang, M.D.

## 2010-12-01 NOTE — Discharge Summary (Signed)
[name], [name] NO.:  [id number]   MEDICAL RECORD NO.:  [id number]          PATIENT TYPE:  IPS   LOCATION:  0301                          [facility]ACILITY[facility]   PHYSICIAN:  Jasmine Pang, M.D. DATE OF BIRTH:  11-07-1975   DATE OF ADMISSION:  03/10/2008  DATE OF DISCHARGE:  03/14/2008                               DISCHARGE SUMMARY   IDENTIFICATION:  This is a 35 year old separated white female who was  admitted on a voluntary basis on March 10, 2008.   HISTORY OF PRESENT ILLNESS:  The patient presents with a history of an  intentional overdose.  She states she took a bunch of pills.  She took  20 Seroquel 300 mg tablets on the day before admission.  She states she  had been having panic attacks.  She thought her children were better off  without her.  Her sister had found out about the overdose and taken the  patient to the emergency department.  The patient's denies any specific  stressors other than reporting a history of anxiety.  Her sleep has been  decreased.  Appetite has been satisfactory.  She denies any alcohol or  drug use.   PAST PSYCHIATRIC HISTORY:  The patient was here in March.  There is no  history of any prior suicide attempt and she reports no current mental  health treatment.  She did have followup scheduled with her local mental  health, but decided to go to her primary care [name] for her  psychotropic medications.   FAMILY HISTORY:  There is history of alcohol abuse, anxiety, and  depression.   ALCOHOL AND DRUG USE:  The patient denies any alcohol or drug use  herself.   MEDICAL PROBLEMS:  She denies any chronic medical illnesses.   MEDICATIONS:  1. Prozac 40 mg daily.  2. Seroquel 300 mg at bedtime.  3. Xanax 1 mg q.i.d.  4. Birth control pills daily.   DRUG ALLERGIES:  No known drug allergies.   PHYSICAL FINDINGS:  There were no acute physical problems noted on exam.  She was medically cleared at the [facility]  ED.   LABORATORY DATA:  Shows a magnesium of 2.1.  BMET was within normal  limits.  Urinalysis was negative.  Pregnancy test was negative.  Alcohol  level less than 5.  TSH is 2.732.  CBC was within normal limits.  Acetaminophen level less than 10.  Urine drug screen is positive for  benzodiazepines.   HOSPITAL COURSE.:  Upon admission, the patient was started on Ambien 5  mg p.o. q.h.s. p.r.n. insomnia.  She was also started on Zofran ODT  p.r.n. due to some nausea.  She was given Percocet 5/325 mg p.o. q.6  hours p.r.n. headache since she was experiencing headache upon  admission.  She was also started on Ativan 1 mg p.o. q.6 h. p.r.n.  anxiety.  She was also started on Prozac 40 mg p.o. daily and Abilify 10  mg p.o. q.h.s.  On March 12, 2008, she was given a one-time dose of  Ativan due to a  panic attack.  She was also placed on Phenergan instead  of Zofran for 25 mg p.o. q.6 h. p.r.n. nausea and vomiting.  On March 12, 2008, the Percocet was discontinued.  She was placed on Midrin 2  capsules p.o. if headache then p.o. every 2 hours up to 5 in a 24-hour  period p.r.n. Phenergan was discontinued.  She was placed back on Zofran  4 mg ODT q.8 hours p.r.n. nausea and vomiting.  On March 13, 2008,  Prozac was discontinued.  She started on Celexa 20 mg p.o. q.a.m. and  Ativan 0.5 mg p.o. now.  In individual sessions with me, the patient was  reserved, but cooperative.  She was initially resistant participating in  unit therapeutic groups and activities.  She stated she felt her  children would be better off without her because of her severe anxiety  and panic attacks.  She denied any use of drugs or alcohol, but did take  the overdose of Seroquel.  She continued to feel anxious with panic  attacks that were treated with Ativan as indicated above.  Sleep was  poor.  Her appetite was poor.  On Dec 12, 2007, she was upset because of  family session did not go well.  She felt discouraged.   Her mother had  indicated that she felt [name] was being selfish, thinking only of  herself instead of her children.  The mother, however, was supported as  well and is main support system.  Mother confronted the patient about  coming in to the hospital, but never following up with which she was  supposed to do.  Mother did all for support.  The patient discussed  healthy coping skills.  She stated she pretended to get out of her room  and house more often.  On March 14, 2008, mental status had improved  markedly from admission status.  Mood was less depressed, less anxious.  Affect was consistent with mood.  There was no suicidal or homicidal  ideation.  No thoughts of self-injurious behavior.  No auditory or  visual hallucinations.  No paranoia or delusions.  Thoughts were logical  and goal-directed.  Thought content, no predominant theme.  Cognitive  was grossly intact.  Insight was fair.  Judgment was fair.  Impulsivity  was low.  She was having no side effects to her medications.   DISCHARGE DIAGNOSES:  Axis I:  Bipolar disorder, not otherwise  specified.  Axis II:  Features of borderline personality disorder.  Axis III: No known medical conditions.  Axis IV:  Problems with primary support group with recent separation and  other psychosocial problems.  Axis V:  GAF was 50 upon discharge.  GAF was 35 upon admission.  GAF  highest past year was 60-65.   DISCHARGE PLANS:  There was no specific activity level or dietary  restrictions.   POSTHOSPITAL CARE PLANS:  The patient will go to the [facility] on  March 22, 2008, at 2:30 p.m.  She will also go to [facility] for  counseling on March 18, 2008, at 9 o'clock a.m.   DISCHARGE MEDICATIONS:  1. Abilify 5 mg p.o. q.h.s.  2. Celexa 20 mg one daily.  3. Ativan 0.5 mg every 8 hours as needed for anxiety.  4. She is to resume her birth control pills as directed by her primary      care physician.      Jasmine Pang, M.D.  Electronically Signed  BHS/MEDQ  D:  03/17/2008  T:  03/18/2008  Job:  [id number]
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
## 2010-12-01 NOTE — H&P (Signed)
NAMETYMIA, STREB NO.:  [id number]   MEDICAL RECORD NO.:  [id number]          PATIENT TYPE:  INP   LOCATION:  0105                         FACILITY:  [facility]   PHYSICIAN:  Eduard Clos, MDDATE OF BIRTH:  03-07-1976   DATE OF ADMISSION:  03/08/2008  DATE OF DISCHARGE:                              HISTORY & PHYSICAL   PRIMARY CARE PHYSICIAN:  Unassigned.  History is obtained from ER  physician, the patient and medical reports.   CHIEF COMPLAINT:  The patient overdosed with Seroquel with the intention  of suicide.   HISTORY OF PRESENT ILLNESS:  A 35 year old female with history of mood  disorder, panic attacks, depression.  Called per his Center today,  saying that he was having too much panic and intention of suicide  apparently with plan to take Seroquel.  The Center immediately sent 911,  but by then the patient had already taken Seroquel 300 mg tablets; the  patient states around 20 of them.  He denies taking any other tablets  other than the regular dosage.   The patient presently is drowsy, but able to be aroused and gives  answers to certain questions.  Denies any chest pain, shortness of  breath, weakness of limbs.  Denies any abdominal pain, nausea or  vomiting, fevers or chills, diarrhea or discharge.   PAST MEDICAL HISTORY:  1. Mood disorder.  2. Depression.  3. Panic attacks.   PAST SURGICAL HISTORY:  1. Cholecystectomy.  2. Surgery for right wrist.   MEDICATIONS PER ADMISSION:  1. Prozac 40 mg daily.  2. Seroquel 300 mg p.o. daily.  3. Proventil.  4. Xanax 1 mg p.o. four times a day as needed.   ALLERGIES:  CODEINE.   FAMILY HISTORY:  Noncontributory.   SOCIAL HISTORY:  The patient smokes cigarettes; advised to quit smoking.  Denies any alcohol or drug abuse.   REVIEW OF SYSTEMS:  As per history of present illness, nothing else  significant.   PHYSICAL EXAMINATION:  The patient was examined at bedside.  Appears  drowsy,  but arousable.  VITAL SIGNS:  Blood pressure 92/33, pulse 100 per minute, temperature  97.4, respirations 18 per minute, O2 saturation 92% on room air.  HEENT:  Anicteric.  PERRLA.  CHEST:  Bilateral air entry; no rhonchi. Clear to auscultation.  HEART:  S1 and S2 heard.  ABDOMEN:  Soft and nontender; bowel sounds heard.  No guarding or  rigidity.  CNS:  The patient is drowsy, arousable.  Answers to questions  appropriately.  Moves upper and lower extremities.  EXTREMITIES:  Peripheral pulses felt; no edema.   LABS:  EKG normal sinus rhythm, nonspecific changes.  Complete Metabolic  Panel:  Sodium 140, potassium 3.4, chloride 108, carbon dioxide 23,  glucose 105, BUN 4, creatinine 0.5, alkaline phosphatase 62, AST 15, ALT  11.  Total protein 6.8, albumin 3.4, calcium 9.4.  ANC screen is  negative.  Acetaminophen level less than 10.  Salicylate level 4.  Drug  screen is positive for benzodiazepine.  Urine is negative for nitrites,  leukocytes, blood, protein or ketones.  Alcohol level less than 5.   ASSESSMENT:  1. Intentional drug overdose with Seroquel, with suicidal ideation.  2. History of mood disorder.  3. Depression.  4. History of panic attacks.   PLAN:  Admit patient to step-down.  Will observe the patient.  Will  place the patient on aspiration precautions, seizure precautions and  suicide precautions with sitter 1S21.  Get psychiatric consult in the  a.m.  Hold off on patient's Seroquel, Xanax and Prozac for now, until we  get recommendation from the psychiatrist.  Will place the patient on  p.r.n. Ativan 1 mg IV q. hourly for any signs for agitation.  Will start  diet only once the patient is more alert and awake.  Will place the  patient on IV fluids.  Further recommendations as the patient  hospitalization evolves.      Eduard Clos, MD  Electronically Signed     ANK/MEDQ  D:  03/08/2008  T:  03/08/2008  Job:  [id number]

## 2010-12-01 NOTE — H&P (Signed)
[name], [name] NO.:  [id number]   MEDICAL RECORD NO.:  [id number]          PATIENT TYPE:  OBV   LOCATION:  1528                         FACILITY:  [facility]   PHYSICIAN:  Della Goo, M.D. DATE OF BIRTH:  1975-08-06   DATE OF ADMISSION:  03/22/2008  DATE OF DISCHARGE:                              HISTORY & PHYSICAL   PRIMARY CARE PHYSICIAN:  Unassigned.   CHIEF COMPLAINT:  Swollen tongue.   HISTORY OF PRESENT ILLNESS:  This is a 35 year old female who presents  to the emergency department emergently secondary to complaints of  worsening tongue swelling and increased arching of her back, which she  reports started in the a.m.  The patient denies taking any new  medications, and she reports taking her regular medications the previous  p.m. before bedtime.  The patient reports having associated symptoms of  diaphoresis and shortness of breath.  She denies having chest pain.  This discomfort was relieved in the emergency department after  administration of IV Ativan x1.  The patient denies having any fevers or  chills.   The patient was last admitted on March 08, 2008, and discharged on  March 10, 2008, secondary to an overdose on Seroquel therapy.  The  Seroquel was discontinued and the patient was placed on Celexa therapy.  Her other medications included Klonopin 1 mg p.o. b.i.d., Celexa 40 mg 1  p.o. daily and oral contraceptives.   ALLERGIES:  CODEINE WHICH CAUSES NAUSEA AND VOMITING.   SOCIAL HISTORY:  The patient is a smoker.  She is a nondrinker.   FAMILY HISTORY:  Noncontributory.   REVIEW OF SYSTEMS:  Pertinents are mentioned above.   PHYSICAL EXAMINATION:  GENERAL:  This is a 35 year old morbidly obese  female in no discomfort or acute distress.  VITAL SIGNS:  Temperature 98.8, blood pressure 149/96, heart rate 97-  115, respirations 18-24.  O2 saturations 98% on room air.  HEENT:  Normocephalic, atraumatic.  There is no scleral icterus.   Pupils  are equally round and reactive to light.  Extraocular movements are  intact.  Funduscopic benign.  Oropharynx is clear.  NECK:  Supple, full range of motion.  No thyromegaly, adenopathy or  jugulovenous distention.  CARDIOVASCULAR:  Regular rate and rhythm.  No  murmurs, gallops or rubs.  LUNGS:  Clear to auscultation bilaterally.  ABDOMEN:  Positive bowel sounds.  Soft, nontender, nondistended.  EXTREMITIES:  Without cyanosis, clubbing or edema.  NEUROLOGIC:  Nonfocal.   LABORATORY STUDIES:  White blood cell count 12.6, hemoglobin 15.6,  hematocrit 46.0, platelets 495.  MCV 88.8, sodium 142, potassium 4.1,  chloride 108, bicarb 20, BUN 3, creatinine 0.70, glucose 121.  Urine HCG  negative.  Albumin 4.3, AST 21, ALT 10, alkaline phosphatase 67, total  bilirubin 0.5.  Cardiac enzymes with a myoglobin of 203, a CK-MB of 1.8  and troponin less than 0.05.  Urine drug screen positive for opiates.  Acetaminophen level less than 10.0.  Salicylates less than 4.0.   ASSESSMENT:  A 35 year old female being admitted with;  1. Allergic reaction.  2. Mild dystonic  reaction.  3. Anxiety/depression.  4. Morbid obesity.   PLAN:  The patient will be admitted for 23-hour observation.  Supplemental oxygen has been ordered as needed.  The patient will also  be monitored continuously for her oxygen levels.  IV Benadryl and Ativan  have also been ordered along with DVT and GI prophylaxis.      Della Goo, M.D.  Electronically Signed     HJ/MEDQ  D:  03/23/2008  T:  03/23/2008  Job:  [id number]

## 2010-12-04 NOTE — Op Note (Signed)
[name], [name]                ACCOUNT NO.:  [id number]   MEDICAL RECORD NO.:  [id number]          PATIENT TYPE:  OIB   LOCATION:  2550                         FACILITY:  [facility]   PHYSICIAN:  Wilmon Arms. Corliss Skains, M.D. DATE OF BIRTH:  [birth year]   DATE OF PROCEDURE:  11/23/2006  DATE OF DISCHARGE:                               OPERATIVE REPORT   PREOPERATIVE DIAGNOSIS:  Chronic calculous cholecystitis.   POSTOPERATIVE DIAGNOSES:  1. Chronic calculous cholecystitis.  2. Choledocholithiasis.   PROCEDURE PERFORMED:  Laparoscopic cholecystectomy with intraoperative  cholangiogram.   SURGEON:  Wilmon Arms. Corliss Skains, M.D.   ASSISTANT:  Ollen Gross. Vernell Morgans, M.D.   ANESTHESIA:  General endotracheal.   INDICATIONS:  The patient is a 35 year old female who presents with  several months of intermittent upper abdominal pain.  Workup including  ultrasound showing a gallbladder with thickened wall and some small  stones.  She was referred for elective cholecystectomy.  Preoperative  liver function tests were normal.   DESCRIPTION OF PROCEDURE:  The patient brought to the operating room and  placed in the supine position on the operating table.  After an adequate  level of general anesthesia was obtained, the patient's abdomen was  prepped with Betadine and draped in a sterile fashion.  A time-out was  taken to assure the proper patient and proper procedure.  Her umbilicus  infiltrated with 0.25% Marcaine.  A transverse incision was made just  below her umbilicus.  The fascia was opened vertically and the  peritoneum was bluntly entered.  A stay suture of 0 Vicryl as placed  around the fascial opening.  The Hasson cannula was inserted and secured  with the stay suture.  Pneumoperitoneum was obtained by insufflating  CO2, maintaining a maximal pressure of 15 mmHg.  The laparoscope was  inserted.  The liver and stomach appeared normal.  A 10 mm port was  placed in the subxiphoid position.  Two 5 mm  ports were placed in the  right upper quadrant.  The fundus the gallbladder was grasped with a  clamp and elevated over the edge of the liver.  There were some omental  adhesions to the hilum of the gallbladder.  These were taken down with  scissors.  The peritoneum was opened and the cystic duct was  circumferentially dissected, ligated and clipped distally.  A Cook  cholangiogram catheter was inserted through a stab incision and threaded  in a small hole in the side of the cystic duct.  A cholangiogram was  then obtained.  This showed good flow proximally and distally with easy  flow into the duodenum.  However, there were 2 persistent filling  defects above the cystic duct-common duct junction.  This persisted on  several fluoroscopy runs.  The catheter was then removed and the cyst  duct was ligated with clips and divided.  The cystic artery was also  ligated with clips and divided.  Cautery was then used to remove the  gallbladder from the liver bed.  The gallbladder was placed in an  EndoCatch sac.  We inspected the right upper  quadrant for bleeding.  The  irrigation was suctioned out.  The gallbladder was then removed through  the umbilical port site.  The pursestring suture was tied down to close  the fascial opening.  Pneumoperitoneum was then released as the ports  were removed.  Monocryl 4-0 was used to close all of the skin  incisions.  Steri-Strips and clean dressings were applied.  The patient  was then extubated and brought to the recovery room in stable condition.  All sponge, instrument, and needle counts were correct.   We will admit the patient overnight and consult GI for a possible ERCP.      Wilmon Arms. Tsuei, M.D.  Electronically Signed     MKT/MEDQ  D:  11/23/2006  T:  11/23/2006  Job:  [id number]   cc:   Foye Deer, M.D.
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
## 2010-12-04 NOTE — Op Note (Signed)
NAMEMAECI, KALBFLEISCH                ACCOUNT NO.:  [id number]   MEDICAL RECORD NO.:  [id number]          PATIENT TYPE:  OIB   LOCATION:  5018                         FACILITY:  [facility]   PHYSICIAN:  Mark C. Ophelia Charter, M.D.    DATE OF BIRTH:  [birth year]   DATE OF PROCEDURE:  08/30/2005  DATE OF DISCHARGE:                                 OPERATIVE REPORT   PREOPERATIVE DIAGNOSIS:  1.  Right displaced angulated distal radius fracture.   POSTOPERATIVE DIAGNOSIS:   OPERATION PERFORMED:  Open reduction internal fixation distal radius.   SURGEON:  Mark C. Ophelia Charter, M.D.   ANESTHESIA:  General orotracheal anesthesia plus 6 mL Marcaine local.   TOURNIQUET TIME:  31 minutes.   DESCRIPTION OF PROCEDURE:  After induction of general anesthesia,  preoperative Ancef prophylaxis, standard prepping with DuraPrep.  Usual  proximal tourniquet, stockinette, rolled towel, towel clip and extremity  sheets and drapes using a hand table, hand was wrapped with an Esmarch,  tourniquet inflated.  Incision was made over the flexor carpi radialis and  extended obliquely at the wrist crease over the radial aspect.  FCR was  exposed and then cutting through into posterior sheath, pronator was peeled  from radial aspect for exposure of the fracture, subperiosteal dissection.  The DVR plate [employer] right standard plate was selected, placed.  Slotted screw was originally selected.  Appropriate screw was filled which  was 12 a little bit short on lateral x-ray and the plate was a little bit  distal.  The plate was adjusted.  Screw was tightened down and the  additional two purple cortical screws were filled proximally.  The wrist was  reduced, held in good position with restoration of the volar angulation  distally.  Distally a combination of some threaded screws and slotted pegs  were placed.  All but one hole was filled since there was not severe  comminution.  All  peg screws did not need to be filled.   Articular surface  was anatomic.  After irrigation with saline solution, tourniquet was  deflated, hemostasis was obtained.  Radial artery was normal.  Pronator was  pulled back in its normal position.  It had some damage to the pronator  muscle belly and it could not be completely sewn along the radial aspect.  Subcutaneous tissue was reapproximated with 3-0 Vicryl and a nylon 4-0  running subcuticular suture was used for skin closure.  Tincture of benzoin  and Steri-Strips, Xeroform and Marcaine infiltration 6 mL total and then  dorsal splint was applied with tweeners between the fingers.  The patient  tolerated the procedure well.      Mark C. Ophelia Charter, M.D.  Electronically Signed     MCY/MEDQ  D:  08/30/2005  T:  08/30/2005  Job:  [id number]

## 2010-12-04 NOTE — Discharge Summary (Signed)
NAMEZUMA, HUST NO.:  [id number]   MEDICAL RECORD NO.:  [id number]          PATIENT TYPE:  IPS   LOCATION:  0300                          [facility]   PHYSICIAN:  Jasmine Pang, M.D. DATE OF BIRTH:  10-17-1975   DATE OF ADMISSION:  08/30/2007  DATE OF DISCHARGE:  09/06/2007                               DISCHARGE SUMMARY   IDENTIFICATION:  This is a 35 year old separated female who was admitted  on a voluntary basis on August 30, 2007.   HISTORY OF PRESENT ILLNESS:  The patient presents with a history of  depression and suicidal thoughts.  She states that she had the worst  panic attack she has ever had.  She was with her child.  She was also  experiencing some passive suicidal thoughts.  She was feeling like not  wanting to be here, but had no active plan to harm herself.  She  reports history of panic attacks, anxiety, and crying at times.  Her  sleep has been decreased, sleeping only 2 hours nightly with the report  of a decreased appetite.  Stressors are her living situation.  She lives  in a double-wide trailer with her parents and other family members.  The  patient finds herself more or less confined to one bedroom in the  trailer.  She also states that although her husband has custody of the  children, the children live with her.  He comes to take them whenever he  wants.   PAST PSYCHIATRIC HISTORY:  The patient was here in the past.  She  believes this may be her third admission here.  She was here in May  2008.  She was at [facility] in the past.  She has no current outpatient  treatment.  She was in therapy in the past.   FAMILY HISTORY:  Aunts and uncles with alcohol problems.   ALCOHOL AND DRUG HISTORY:  She has no alcohol or drug abuse issues.  She  does smoke 2 packs of cigarettes per day.   PAST MEDICAL HISTORY:  Recently reports a history of a fractured elbow  and has an appointment with [facility].  She also has  headaches and GERD symptoms.   MEDICATIONS:  1. Klonopin 1 mg p.o. t.i.d.  2. Prozac 40 mg daily.  3. Ambien 10 mg p.o. q.h.s.   DRUG ALLERGIES:  No known drug allergies.   PHYSICAL FINDINGS:  There were no acute physical or medical problems  noted on physical exam.   LABORATORY DATA:  Her urine drug screen was positive for  benzodiazepines.  Glucose was 117.  The alcohol level was less than 5.  WBC count was 11.9.  CT scan was within normal limits.   HOSPITAL COURSE:  Upon admission, the patient was continued on Klonopin  1 mg p.o. t.i.d., Prozac 40 mg daily, and Ambien 10 mg p.o. q.h.s.  She  was also placed on Zantac 150 mg p.o. b.i.d. and Midrin for headaches.  On August 31, 2007, the patient was started on Abilify 5 mg q.a.m.  In  individual sessions  with me, the patient was somewhat reserved, but  cooperative with exam.  She also participated appropriately in unit  therapeutic groups and activities.  She states she had felt out of  control and did not want to be around anymore.  She stated she has  been in our hospital 3 times and to [facility] once (July  2008).  Stressors include conflict with her ex-husband who has custody  of her children.  She also lives in a double-wide mobile home with  multiple family members and essentially has only one bedroom to stay in  with her children.  She has a best friend who she says deserted her for  an unknown reason.  She has no outpatient treatment now, but has been in  therapy in the past.  She felt she was not being supported by her mother  who was upset that she had come to the hospital.  She discussed her  history of no-shows at the [facility] where she was supposed to have  received outpatient treatment.  On September 01, 2007, she was given  Phenergan for some nausea.  She was anxious about a visit from her  mother and sister.  The following day, she felt they did not understand  her psychiatric illness.  She  states they think she is selfish.  She is  enjoying visits from her daughter.  The patient was started on Seroquel  200 mg p.o. q.h.s. to help with sleep, which had been a problem.  As  hospitalization progressed, she remained anxious, but became less  depressed.  Her family visit went well.  She enjoyed seeing her kids.  She stated the interaction with her mother was good.  She had no  suicidal or homicidal ideation.  She was feeling panicky with anxiety,  shortness of breath, and shakiness at one point.  On September 04, 2007,  the patient began to talk about wanting to go to the intensive  outpatient program.  She was still having difficulty sleeping.  She did  not like the Ambien and wanted this discontinued, instead, her Seroquel  was increased to 300 mg p.o. q.h.s.  Abilify was increased to 10 mg p.o.  daily.  On September 05, 2007, she states that she wanted the Ambien  restarted because she felt it helped her more than she realized, it was  Ambien 10 mg p.o. q.h.s., may repeat x1 was ordered.  On September 06, 2007, mental status had improved markedly from admission status.  The  patient felt ready to go home.  Her sleep was good.  Appetite was good.  Mood was less depressed and anxious.  Affect consistent with mood.  There was no suicidal or homicidal ideation.  No thoughts of self-  injurious behavior.  No auditory or visual hallucinations.  No paranoia  or delusions.  Thoughts were logical and goal directed.  Thought  content, no predominant theme.  Cognitive was grossly back to baseline.  The patient planned for her father to transport her home.  She is going  to go to the [facility] for followup treatment after leaving the  hospital.   DISCHARGE DIAGNOSES:  Axis I:  Mood disorder, not otherwise specified,  generalized anxiety disorder.  Axis II:  None.  Axis III:  Headaches, gastroesophageal reflux disease, fractured right  elbow.  Axis IV:  Severe (psychosocial  problems, medical problems, problems with  transportation, problems with housing, burden of psychiatric illness,  burden of medical problems).  Axis V:  Global assessment of functioning upon discharge was 50.  GAF  upon admission was 40.  GAF highest past year was 65.   DISCHARGE PLANS:  There were no specific activity level or dietary  restrictions.  The patient was given the number for community support to  help to assist her with being able to live on her own.  She is also  going to go to Dr. Lang Snow for a  reentry appointment at the [facility] on September 14, 2007 at 2 o'clock p.m. and the [facility] on  September 07, 2007 at 11 o'clock a.m.   DISCHARGE MEDICATIONS:  1. Klonopin 1 mg p.o. t.i.d.  2. Fluoxetine 40 mg daily.  3. Zantac as directed.  4. Seroquel 300 mg at bedtime.  5. Abilify 10 mg daily.  6. Ambien 10 mg at bedtime.      Jasmine Pang, M.D.  Electronically Signed     BHS/MEDQ  D:  09/23/2007  T:  09/23/2007  Job:  [id number]

## 2010-12-04 NOTE — Consult Note (Signed)
[name], KEEPERS                ACCOUNT NO.:  [id number]   MEDICAL RECORD NO.:  [id number]          PATIENT TYPE:  OIB   LOCATION:  5743                         FACILITY:  [facility]   PHYSICIAN:  James L. Malon Kindle., M.D.DATE OF BIRTH:  [birth year]   DATE OF CONSULTATION:  11/23/2006  DATE OF DISCHARGE:                                 CONSULTATION   GASTROENTEROLOGY CONSULTATION:   REASON FOR CONSULTATION:  Common duct stone.   HISTORY:  A nice 35 year old female who has been found recently to have  severe abdominal pain, came to the emergency room.  Dr. Tomasa Blase, her  family doctor, ordered an ultrasound that showed common duct stones with  a contracted gallbladder.  Today, she underwent a laparoscopic  cholecystectomy and was found to have chronic calculus cholecystitis and  was found to have choledocholithiasis.  Review of the film shows a  probable stone floating in the proximal duct.  The patient feels much  better postoperative and is tolerating clear liquids.   CURRENT MEDICATIONS:  Prozac and trazodone.   ALLERGIES:  She has no drug allergies.   PAST MEDICAL HISTORY:  1. Had surgery on her wrist.  2. Had a history of depression, hospitalization for that and at      childbirth.  3. Has a history of migraine headaches.   FAMILY HISTORY:  Noncontributory.   SOCIAL HISTORY:  The patient is a smoker.  She is living at home with  her mother and caring for her mother at this time and is currently  separated.   PHYSICAL EXAMINATION:  VITAL SIGNS:  Unremarkable.  The patient is  afebrile.  GENERAL:  A pleasant, alert white female who is completely awake and  appears to completely recover from anesthesia.  EYES:  Sclerae nonicteric.  LUNGS:  Clear.  HEART:  Regular rate and rhythm without murmurs or gallops.  ABDOMEN:  Reveals a few bowel sounds, is nondistended with a normal  amount of postoperative tenderness.   ASSESSMENT:  Common bile duct stone.   PLAN:  Could  recommend an ERCP at this time.  I have discussed  this in  detail with the patient including the reasons for doing this.  The risks  and benefits of the procedure and the possibility of pancreatitis,  bleeding, etc.  An ERCP is scheduled for noon tomorrow.           ______________________________  Llana Aliment Malon Kindle., M.D.     Waldron Session  D:  11/23/2006  T:  11/23/2006  Job:  [id number]   cc:   Wilmon Arms. Tsuei, M.D.  Foye Deer, M.D.

## 2010-12-04 NOTE — Discharge Summary (Signed)
[name], [name]                ACCOUNT NO.:  [id number]   MEDICAL RECORD NO.:  [id number]          PATIENT TYPE:  INP   LOCATION:  5743                         FACILITY:  [facility]   PHYSICIAN:  Wilmon Arms. Corliss Skains, M.D. DATE OF BIRTH:  01-Nov-1975   DATE OF ADMISSION:  11/23/2006  DATE OF DISCHARGE:  11/25/2006                               DISCHARGE SUMMARY   ADMISSION DIAGNOSIS:  Symptomatic cholelithiasis.   DISCHARGE DIAGNOSES:  1. Symptomatic cholelithiasis.  2. Choledocholithiasis.   PROCEDURES:  Laparoscopic cholecystectomy with intraoperative  cholangiogram and endoscopic retrograde cholangiopancreatogram with  sphincterotomy.   HISTORY:  The patient is a 35 year old female who has had several months  of upper abdominal pain.  Workup showed gallstones.   HOSPITAL COURSE:  She underwent a laparoscopic cholecystectomy on Nov 23, 2006.  The cholangiogram showed some filling defects in the proximal  common bile duct.  Her preoperative liver functions were normal.  The  patient was admitted to hospital, and GI was consulted.  Dr. Evette Cristal  performed an ERCP on Nov 24, 2006.  He was able to retrieve several tiny  stones.  The patient tolerated both procedures well.  She is tolerating  regular diet.  She will be discharged home today with Percocet p.r.n.  for pain.  Follow up in 2-3 weeks.      Wilmon Arms. Tsuei, M.D.  Electronically Signed     MKT/MEDQ  D:  11/25/2006  T:  11/25/2006  Job:  [id number]

## 2010-12-04 NOTE — Discharge Summary (Signed)
[name], [name]                ACCOUNT NO.:  [id number]   MEDICAL RECORD NO.:  [id number]          PATIENT TYPE:  INP   LOCATION:  9131                          FACILITY:  [facility]   PHYSICIAN:  Malachi Pro. Ambrose Mantle, M.D. DATE OF BIRTH:  08-Nov-1975   DATE OF ADMISSION:  09/24/2004  DATE OF DISCHARGE:                                 DISCHARGE SUMMARY   HISTORY OF PRESENT ILLNESS:  This is a 35 year old white married female para  1-0-0-1 gravida 2; [facility] October 04, 2004 by ultrasound; admitted at 8 cm  dilatation with a positive group B strep. The patient's blood group and type  A positive, negative antibody, nonreactive serology, rubella equivocal,  hepatitis B surface antigen negative, HIV negative, GC and chlamydia  negative, triple screen negative, 1-hour Glucola 93, group B strep positive.  Vaginal ultrasound March 09, 2004:  Crown-rump length 3.25 cm, 10 weeks 1  days; [facility] October 04, 2004. The patient requested tubal ligation. Repeat  ultrasound on May 10, 2004:  Average gestational age [redacted] weeks 2 days;  [facility] September 25, 2004. Poor sonographic image was obtained. Final ultrasound  May 26, 2004 showed normal anatomy. The patient took Valtrex in late  pregnancy. She came to maternity admission unit and was 8 cm dilated with  contractions every 3 minutes. Past medical history:  Allergies: CODEINE  caused vomiting. Illnesses:  Herpes, migraines, GERD. Operations:  None.  Family history:  Parents alcohol and tobacco abuse, maternal grandmother  lung cancer, maternal grandfather prostate cancer, first cousin with a cleft  lip and palate, father-of-the-baby's first cousin mentally slow. Alcohol,  tobacco, and drugs:  One-half pack of cigarettes a day. Obstetric history:  In November 2000, delivered a 7-pound 9-ounce female vaginally. Physical  exam on admission revealed normal vital signs, heart and lungs were normal.  Abdomen soft, not tender. Fetal heart tones normal. After admission,  the  cervix was 10 cm, bulging bag of waters. I chose not to rupture the  membranes until the patient had had an hour of penicillin. She requested and  received an epidural. She received her penicillin. Then, artificial rupture  of membranes produced slightly meconium-stained fluid. She pushed well at  full diltation and delivered spontaneously OA over a second degree midline  laceration by Dr. Ambrose Mantle a living female infant, 8 pounds 2 ounces, Apgars of  9 at one and 9 at five minutes. Placenta was intact, uterus normal, rectal  was negative. The patient complained of dyspareunia since her first birth.  No abnormalities of the perineal structures was seen. Second degree midline  laceration repaired with 3-0 Vicryl with blood loss about 400 mL. Postpartum  tubal was discussed with the patient. I advised her to give me an answer in  a few hours so I could schedule it. The nose and pharynx of the baby were  suctioned with the DeLee and the bulb with the head on the perineum and no  meconium was obtained. The patient decided against the tubal. On postpartum  day #1 she was ready for discharge, ambulating well, had no complaints.  Hemoglobin on  admission 11.1; hematocrit 33.5; white count 20,900; platelet  count 513,000. Follow-up hemoglobin 9.8, hematocrit 30. RPR was nonreactive.   FINAL DIAGNOSES:  1.  Intrauterine pregnancy 38+ weeks, delivered occiput anterior.  2.  Positive group B streptococcus.   OPERATION:  Spontaneous delivery OA, repair of second degree midline  laceration.   FINAL CONDITION:  Improved.   Instructions include our regular discharge instruction booklet. The patient  is advised to return in 6 weeks for follow-up examination.      TFH/MEDQ  D:  09/25/2004  T:  09/25/2004  Job:  [id number]
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
## 2010-12-04 NOTE — Op Note (Signed)
[name], [name] NO.:  [id number]   MEDICAL RECORD NO.:  [id number]          PATIENT TYPE:  INP   LOCATION:  5743                         FACILITY:  [facility]   PHYSICIAN:  Graylin Shiver, M.D.   DATE OF BIRTH:  05-13-1976   DATE OF PROCEDURE:  11/24/2006  DATE OF DISCHARGE:                               OPERATIVE REPORT   PROCEDURE:  Endoscopic retrograde cholangiogram with sphincterotomy.   INDICATIONS FOR PROCEDURE:  The patient is a 35 year old female status  post laparoscopic cholecystectomy.  Her intraoperative cholangiogram  raised the suspicion of a stone in the biliary tree.  Dr. Corliss Skains  requested a ERCP.   Informed consent was obtained after explanation of the risks of  bleeding, infection and perforation.   PREMEDICATION:  Fentanyl 125 mcg IV, Versed 11 mg IV, Benadryl 25 mg IV,  glucagon 1 mg IV given during the procedure.   PROCEDURE:  With the patient lying on her abdomen on the fluoroscopy  table, the Pentax duodenoscope was inserted into the oropharynx and  passed into the esophagus.  It was advanced down the esophagus, then  into the stomach and into the duodenum.  The papilla of Vater was  located, selective cannulation was achieved of the common bile duct  using a guidewire and papillotome, contrast was injected into the  biliary tree.  No obvious stones were seen, but because of the finding  on cholangiogram, a sphincterotomy was done.  This went smoothly without  problems.  The sphincterotome was then removed and the guidewire was  left in place into the biliary tree.  The balloon was then advanced over  the guidewire and up into the biliary tree.  The balloon was inflated to  9 mm and the duct was swept a couple of times.  A couple pieces of  blackish colored gravel were seen but no obvious larger stones.  Occlusion cholangiogram looked unremarkable.  She tolerated the  procedure well without obvious complications.   IMPRESSION:   Choledocholithiasis status post sphincterotomy           ______________________________  Graylin Shiver, M.D.     SFG/MEDQ  D:  11/24/2006  T:  11/24/2006  Job:  [id number]
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
## 2010-12-04 NOTE — Op Note (Signed)
NAMEGRAVIELA, NODAL                ACCOUNT NO.:  [id number]   MEDICAL RECORD NO.:  [id number]          PATIENT TYPE:  OIB   LOCATION:  2550                         FACILITY:  [facility]   PHYSICIAN:  Wilmon Arms. Corliss Skains, M.D. DATE OF BIRTH:  September 17, 1975   DATE OF PROCEDURE:  11/23/2006  DATE OF DISCHARGE:                               OPERATIVE REPORT   PREOPERATIVE DIAGNOSIS:  Chronic calculous cholecystitis.   POSTOPERATIVE DIAGNOSES:  1. Chronic calculous cholecystitis.  2. Choledocholithiasis.   SURGEON:  Wilmon Arms. Corliss Skains, M.D.   ASSESSMENT:  Ollen Gross. Vernell Morgans, M.D.   ANESTHESIA:  General endotracheal.   INDICATIONS:  The patient is a 35 year old female who presents with 4  months of episodic abdominal pain.  Workup showed a gallbladder with  thickened, wall some intraluminal debris and small stones.  She was  referred for elective cholecystectomy.   DESCRIPTION OF PROCEDURE:  The patient was  brought to the operating  room and placed in supine position on the operating table.  After an  adequate level of general anesthesia was obtained, the patient's abdomen  was prepped with Betadine and draped in sterile fashion.  A time-out was  taken to assure the proper patient and proper procedure.  The umbilicus  was infiltrated 0.25% Marcaine.  A transverse incision was made below  the umbilicus.  Dissection was carried down to the fascia, which was  opened vertically.  The peritoneum was bluntly entered.  A stay suture  of 0 Vicryl as placed around the fascial opening.  The Hasson cannula  was inserted and secured with a stay suture.  Pneumoperitoneum is  obtained by insufflating CO2, maintaining a maximal pressure of 15 mmHg.  The laparoscope was inserted as the patient was rotated in reverse  Trendelenburg position and tilted to the left.  The liver, stomach and  gallbladder appeared normal.  A 10 mm port was placed in the subxiphoid  position.  Two 5 mm ports were placed in the right  upper quadrant.  The  fundus of the gallbladder was grasped with a clamp and elevated over the  edge of the liver.  There were some adhesions to the hilum of the  gallbladder.  These were taken down with cautery scissors.  The  peritoneum around the hilum of the gallbladder was opened.  The cystic  duct was circumferentially dissected and ligated with a clip proximally.  A small opening was created on the cystic duct.  A Cook cholangiogram  catheter was inserted through a stab incision and threaded into the  cystic duct.  This was  secured with a clip.  A cholangiogram was obtained, which showed good  flow proximally and distally in the biliary tree.   Dictation ended at this point.      Wilmon Arms. Tsuei, M.D.  Electronically Signed     MKT/MEDQ  D:  11/23/2006  T:  11/23/2006  Job:  [id number]

## 2011-01-13 ENCOUNTER — Emergency Department (HOSPITAL_COMMUNITY): Payer: Medicaid Other

## 2011-01-13 ENCOUNTER — Emergency Department (HOSPITAL_COMMUNITY)
Admission: EM | Admit: 2011-01-13 | Discharge: 2011-01-13 | Disposition: A | Discharge status: Home / Self Care | Payer: Medicaid Other | Attending: Emergency Medicine | Admitting: Emergency Medicine

## 2011-01-13 DIAGNOSIS — S92919A Unspecified fracture of unspecified toe(s), initial encounter for closed fracture: Secondary | ICD-10-CM | POA: Insufficient documentation

## 2011-01-13 DIAGNOSIS — W010XXA Fall on same level from slipping, tripping and stumbling without subsequent striking against object, initial encounter: Secondary | ICD-10-CM | POA: Insufficient documentation

## 2011-01-13 DIAGNOSIS — S139XXA Sprain of joints and ligaments of unspecified parts of neck, initial encounter: Secondary | ICD-10-CM | POA: Insufficient documentation

## 2011-01-13 DIAGNOSIS — S335XXA Sprain of ligaments of lumbar spine, initial encounter: Secondary | ICD-10-CM | POA: Insufficient documentation

## 2011-01-21 ENCOUNTER — Emergency Department (HOSPITAL_COMMUNITY)
Admission: EM | Admit: 2011-01-21 | Discharge: 2011-01-22 | Disposition: A | Discharge status: Home / Self Care | Payer: Medicaid Other | Attending: Emergency Medicine | Admitting: Emergency Medicine

## 2011-01-21 DIAGNOSIS — F17200 Nicotine dependence, unspecified, uncomplicated: Secondary | ICD-10-CM | POA: Insufficient documentation

## 2011-01-21 DIAGNOSIS — R109 Unspecified abdominal pain: Secondary | ICD-10-CM | POA: Insufficient documentation

## 2011-01-21 DIAGNOSIS — M545 Low back pain, unspecified: Secondary | ICD-10-CM | POA: Insufficient documentation

## 2011-01-21 DIAGNOSIS — R197 Diarrhea, unspecified: Secondary | ICD-10-CM | POA: Insufficient documentation

## 2011-01-22 LAB — DIFFERENTIAL
Basophils Absolute: 0 10*3/uL (ref 0.0–0.1)
Basophils Relative: 0 % (ref 0–1)
Monocytes Absolute: 1 10*3/uL (ref 0.1–1.0)
Neutro Abs: 5.8 10*3/uL (ref 1.7–7.7)
Neutrophils Relative %: 53 % (ref 43–77)

## 2011-01-22 LAB — URINALYSIS, ROUTINE W REFLEX MICROSCOPIC
Glucose, UA: NEGATIVE mg/dL
Leukocytes, UA: NEGATIVE
Protein, ur: NEGATIVE mg/dL
Specific Gravity, Urine: 1.027 (ref 1.005–1.030)
Urobilinogen, UA: 0.2 mg/dL (ref 0.0–1.0)

## 2011-01-22 LAB — COMPREHENSIVE METABOLIC PANEL
AST: 15 U/L (ref 0–37)
Albumin: 3.9 g/dL (ref 3.5–5.2)
Calcium: 8.8 mg/dL (ref 8.4–10.5)
Chloride: 107 mEq/L (ref 96–112)
Creatinine, Ser: 0.63 mg/dL (ref 0.50–1.10)

## 2011-01-22 LAB — POCT PREGNANCY, URINE: Preg Test, Ur: NEGATIVE

## 2011-01-22 LAB — CBC
Hemoglobin: 13.5 g/dL (ref 12.0–15.0)
MCHC: 33.3 g/dL (ref 30.0–36.0)
WBC: 10.9 10*3/uL — ABNORMAL HIGH (ref 4.0–10.5)

## 2011-03-05 NOTE — Patient Instructions (Addendum)
[name]  Your procedure is scheduled on: Monday 03/15/11 Enter through the Main Entrance of [facility] at:6AM Pick up the phone a the desk and dial [phone] Please call this number if you have any problems the morning of surgery: [phone]  Remember: Do not eat food after midnight  Do not drink clear liquids after:midnight Take these medicines the morning of surgery with a SIP OF WATER:use acid reducer prior to coming in Do not wear jewelry, make-up, or nail polish Do not wear lotions, powders, or perfumes. Do not shave 48 hours prior to surgery. Do not bring valuables to the hospital. Leave suitcase in the car. After Surgery it may be brought to your room. For patients being admitted to the hospital, checkout time is 11:00am the day of discharge.  Patients discharged on the day of surgery will not be allowed to drive home.   Name and phone number of your driver:mother [phone]  Please read over the Hibiclens Guide to General Skin Cleansing at Home. Remember that hibiclens is not to be used on the head, face, or vaginal area. Please shower with 1/2 bottle the evening before surgery and other 1/2 bottle morning of surgery prior to arrival at hospital.  Do not use any deodorants, lotions, or powders after hibiclens shower.

## 2011-03-08 ENCOUNTER — Encounter (HOSPITAL_COMMUNITY): Payer: Self-pay

## 2011-03-08 ENCOUNTER — Encounter (HOSPITAL_COMMUNITY)
Admission: RE | Admit: 2011-03-08 | Discharge: 2011-03-08 | Disposition: A | Discharge status: Home / Self Care | Payer: Medicaid Other | Source: Ambulatory Visit | Attending: Obstetrics and Gynecology | Admitting: Obstetrics and Gynecology

## 2011-03-08 HISTORY — DX: Anxiety disorder, unspecified: F41.9

## 2011-03-08 HISTORY — DX: Bronchitis, not specified as acute or chronic: J40

## 2011-03-08 HISTORY — DX: Major depressive disorder, single episode, unspecified: F32.9

## 2011-03-08 HISTORY — DX: Headache: R51

## 2011-03-08 HISTORY — DX: Gastro-esophageal reflux disease without esophagitis: K21.9

## 2011-03-08 HISTORY — DX: Depression, unspecified: F32.A

## 2011-03-08 LAB — CBC
HCT: 38.6 % (ref 36.0–46.0)
Hemoglobin: 13.7 g/dL (ref 12.0–15.0)
MCH: 30.7 pg (ref 26.0–34.0)
MCHC: 35.5 g/dL (ref 30.0–36.0)
MCV: 86.5 fL (ref 78.0–100.0)

## 2011-03-08 LAB — SURGICAL PCR SCREEN: Staphylococcus aureus: POSITIVE — AB

## 2011-03-08 NOTE — Anesthesia Preprocedure Evaluation (Addendum)
Anesthesia Evaluation  Name, MR# and DOB Patient awake  General Assessment Comment  Reviewed: Allergy & Precautions, H&P , NPO status , Patient's Chart, lab work & pertinent test results  Airway Mallampati: II TM Distance: >3 FB Neck ROM: Full    Dental  (+) Edentulous Upper and Edentulous Lower   Pulmonary    pulmonary exam normalPulmonary Exam Normal     Cardiovascular     Neuro/Psych Negative Neurological ROS    GI/Hepatic/Renal negative GI ROS  negative Liver ROS  negative Renal ROS   GERD Medicated     Endo/Other  Negative Endocrine ROS (+)      Abdominal Normal abdominal exam  (+)   Musculoskeletal negative musculoskeletal ROS (+)   Hematology negative hematology ROS (+)   Peds  Reproductive/Obstetrics negative OB ROS    Anesthesia Other Findings             Anesthesia Physical Anesthesia Plan  ASA: II  Anesthesia Plan: General   Post-op Pain Management:    Induction:   Airway Management Planned: Oral ETT  Additional Equipment:   Intra-op Plan:   Post-operative Plan: Extubation in OR  Informed Consent:   Plan Discussed with: CRNA  Anesthesia Plan Comments:        Anesthesia Quick Evaluation

## 2011-03-15 ENCOUNTER — Ambulatory Visit (HOSPITAL_COMMUNITY): Payer: Medicaid Other | Admitting: Anesthesiology

## 2011-03-15 ENCOUNTER — Encounter (HOSPITAL_COMMUNITY): Payer: Self-pay | Admitting: Anesthesiology

## 2011-03-15 ENCOUNTER — Ambulatory Visit (HOSPITAL_COMMUNITY)
Admission: RE | Admit: 2011-03-15 | Discharge: 2011-03-15 | Disposition: A | Discharge status: Home / Self Care | Payer: Medicaid Other | Source: Ambulatory Visit | Attending: Obstetrics and Gynecology | Admitting: Obstetrics and Gynecology

## 2011-03-15 ENCOUNTER — Encounter (HOSPITAL_COMMUNITY)
Admission: RE | Disposition: A | Discharge status: Home / Self Care | Payer: Self-pay | Source: Ambulatory Visit | Attending: Obstetrics and Gynecology

## 2011-03-15 DIAGNOSIS — N949 Unspecified condition associated with female genital organs and menstrual cycle: Secondary | ICD-10-CM | POA: Insufficient documentation

## 2011-03-15 DIAGNOSIS — N803 Endometriosis of pelvic peritoneum, unspecified: Secondary | ICD-10-CM | POA: Insufficient documentation

## 2011-03-15 DIAGNOSIS — Z01812 Encounter for preprocedural laboratory examination: Secondary | ICD-10-CM | POA: Insufficient documentation

## 2011-03-15 DIAGNOSIS — N80109 Endometriosis of ovary, unspecified side, unspecified depth: Secondary | ICD-10-CM | POA: Insufficient documentation

## 2011-03-15 DIAGNOSIS — N801 Endometriosis of ovary: Secondary | ICD-10-CM | POA: Insufficient documentation

## 2011-03-15 DIAGNOSIS — Z01818 Encounter for other preprocedural examination: Secondary | ICD-10-CM | POA: Insufficient documentation

## 2011-03-15 DIAGNOSIS — R102 Pelvic and perineal pain: Secondary | ICD-10-CM

## 2011-03-15 DIAGNOSIS — G8929 Other chronic pain: Secondary | ICD-10-CM

## 2011-03-15 HISTORY — PX: LAPAROSCOPY: SHX197

## 2011-03-15 SURGERY — LAPAROSCOPY, DIAGNOSTIC
Anesthesia: General | Site: Abdomen | Wound class: Clean

## 2011-03-15 MED ORDER — SODIUM CHLORIDE 0.9 % IJ SOLN
INTRAMUSCULAR | Status: DC | PRN
  Administered 2011-03-15: 7 mL

## 2011-03-15 MED ORDER — FENTANYL CITRATE 0.05 MG/ML IJ SOLN
INTRAMUSCULAR | Status: AC
  Filled 2011-03-15: qty 5

## 2011-03-15 MED ORDER — ONDANSETRON HCL 4 MG/2ML IJ SOLN
INTRAMUSCULAR | Status: AC
  Filled 2011-03-15: qty 2

## 2011-03-15 MED ORDER — KETOROLAC TROMETHAMINE 30 MG/ML IJ SOLN: 15.0000 mg | Freq: Once | INTRAMUSCULAR | Status: DC | PRN

## 2011-03-15 MED ORDER — NEOSTIGMINE METHYLSULFATE 1 MG/ML IJ SOLN
INTRAMUSCULAR | Status: AC
  Filled 2011-03-15: qty 10

## 2011-03-15 MED ORDER — FENTANYL CITRATE 0.05 MG/ML IJ SOLN
INTRAMUSCULAR | Status: DC | PRN
  Administered 2011-03-15: 100 ug via INTRAVENOUS
  Administered 2011-03-15: 50 ug via INTRAVENOUS
  Administered 2011-03-15 (×2): 100 ug via INTRAVENOUS

## 2011-03-15 MED ORDER — ROCURONIUM BROMIDE 100 MG/10ML IV SOLN
INTRAVENOUS | Status: DC | PRN
  Administered 2011-03-15: 30 mg via INTRAVENOUS

## 2011-03-15 MED ORDER — MIDAZOLAM HCL 5 MG/5ML IJ SOLN
INTRAMUSCULAR | Status: DC | PRN
  Administered 2011-03-15: 2 mg via INTRAVENOUS

## 2011-03-15 MED ORDER — BUPIVACAINE HCL (PF) 0.25 % IJ SOLN
INTRAMUSCULAR | Status: DC | PRN
  Administered 2011-03-15: 10 mL

## 2011-03-15 MED ORDER — LIDOCAINE HCL (CARDIAC) 20 MG/ML IV SOLN
INTRAVENOUS | Status: DC | PRN
  Administered 2011-03-15: 100 mg via INTRAVENOUS

## 2011-03-15 MED ORDER — DEXAMETHASONE SODIUM PHOSPHATE 4 MG/ML IJ SOLN
INTRAMUSCULAR | Status: DC | PRN
  Administered 2011-03-15: 10 mg via INTRAVENOUS

## 2011-03-15 MED ORDER — LACTATED RINGERS IV SOLN: INTRAVENOUS | Status: DC

## 2011-03-15 MED ORDER — OXYCODONE-ACETAMINOPHEN 5-325 MG PO TABS: 2.0000 | ORAL_TABLET | Freq: Four times a day (QID) | ORAL | Status: AC | PRN

## 2011-03-15 MED ORDER — ROCURONIUM BROMIDE 50 MG/5ML IV SOLN
INTRAVENOUS | Status: AC
  Filled 2011-03-15: qty 1

## 2011-03-15 MED ORDER — LACTATED RINGERS IV SOLN
INTRAVENOUS | Status: DC
  Administered 2011-03-15 (×2): via INTRAVENOUS

## 2011-03-15 MED ORDER — DEXAMETHASONE SODIUM PHOSPHATE 10 MG/ML IJ SOLN
INTRAMUSCULAR | Status: AC
  Filled 2011-03-15: qty 1

## 2011-03-15 MED ORDER — LIDOCAINE HCL (CARDIAC) 20 MG/ML IV SOLN
INTRAVENOUS | Status: AC
  Filled 2011-03-15: qty 5

## 2011-03-15 MED ORDER — NEOSTIGMINE METHYLSULFATE 1 MG/ML IJ SOLN
INTRAMUSCULAR | Status: DC | PRN
  Administered 2011-03-15: 1 mg via INTRAMUSCULAR

## 2011-03-15 MED ORDER — FENTANYL CITRATE 0.05 MG/ML IJ SOLN: 25.0000 ug | INTRAMUSCULAR | Status: DC | PRN

## 2011-03-15 MED ORDER — MEPERIDINE HCL 25 MG/ML IJ SOLN: 6.2500 mg | INTRAMUSCULAR | Status: DC | PRN

## 2011-03-15 MED ORDER — ONDANSETRON HCL 4 MG/2ML IJ SOLN
INTRAMUSCULAR | Status: DC | PRN
  Administered 2011-03-15: 4 mg via INTRAVENOUS

## 2011-03-15 MED ORDER — OXYCODONE-ACETAMINOPHEN 5-325 MG PO TABS
ORAL_TABLET | ORAL | Status: AC
  Filled 2011-03-15: qty 1

## 2011-03-15 MED ORDER — PROPOFOL 10 MG/ML IV EMUL
INTRAVENOUS | Status: DC | PRN
  Administered 2011-03-15: 200 mg via INTRAVENOUS

## 2011-03-15 MED ORDER — FENTANYL CITRATE 0.05 MG/ML IJ SOLN
INTRAMUSCULAR | Status: AC
  Filled 2011-03-15: qty 2

## 2011-03-15 MED ORDER — GLYCOPYRROLATE 0.2 MG/ML IJ SOLN
INTRAMUSCULAR | Status: DC | PRN
  Administered 2011-03-15: .2 mg via INTRAVENOUS
  Administered 2011-03-15: 0.2 mg via INTRAVENOUS

## 2011-03-15 MED ORDER — PROPOFOL 10 MG/ML IV EMUL
INTRAVENOUS | Status: AC
  Filled 2011-03-15: qty 20

## 2011-03-15 MED ORDER — GLYCOPYRROLATE 0.2 MG/ML IJ SOLN
INTRAMUSCULAR | Status: AC
  Filled 2011-03-15: qty 1

## 2011-03-15 MED ORDER — ONDANSETRON HCL 4 MG/2ML IJ SOLN: 4.0000 mg | Freq: Once | INTRAMUSCULAR | Status: DC | PRN

## 2011-03-15 MED ORDER — MIDAZOLAM HCL 2 MG/2ML IJ SOLN
INTRAMUSCULAR | Status: AC
  Filled 2011-03-15: qty 2

## 2011-03-15 SURGICAL SUPPLY — 28 items
CABLE HIGH FREQUENCY MONO STRZ (ELECTRODE) IMPLANT
CATH ROBINSON RED A/P 16FR (CATHETERS) IMPLANT
CATH SILICONE 16FRX5CC (CATHETERS) ×1 IMPLANT
CHLORAPREP W/TINT 26ML (MISCELLANEOUS) ×2 IMPLANT
CLOTH BEACON ORANGE TIMEOUT ST (SAFETY) ×2 IMPLANT
DECANTER SPIKE VIAL GLASS SM (MISCELLANEOUS) ×2 IMPLANT
DERMABOND ADVANCED (GAUZE/BANDAGES/DRESSINGS) ×3 IMPLANT
GLOVE BIO SURGEON STRL SZ8 (GLOVE) ×2 IMPLANT
GLOVE ORTHO TXT STRL SZ7.5 (GLOVE) ×2 IMPLANT
GOWN PREVENTION PLUS LG XLONG (DISPOSABLE) ×6 IMPLANT
GOWN STRL REIN XL XLG (GOWN DISPOSABLE) ×2 IMPLANT
NDL EPID 17G 5 ECHO TUOHY (NEEDLE) IMPLANT
NDL INSUFFLATION 14GA 120MM (NEEDLE) ×1 IMPLANT
NEEDLE EPID 17G 5 ECHO TUOHY (NEEDLE) IMPLANT
NEEDLE INSUFFLATION 14GA 120MM (NEEDLE) ×2 IMPLANT
NS IRRIG 1000ML POUR BTL (IV SOLUTION) ×2 IMPLANT
PACK LAPAROSCOPY BASIN (CUSTOM PROCEDURE TRAY) ×2 IMPLANT
SCALPEL HARMONIC ACE (MISCELLANEOUS) IMPLANT
SET IRRIG TUBING LAPAROSCOPIC (IRRIGATION / IRRIGATOR) IMPLANT
SLEEVE Z-THREAD 5X100MM (TROCAR) ×1 IMPLANT
SOLUTION ELECTROLUBE (MISCELLANEOUS) IMPLANT
SUT VICRYL 0 UR6 27IN ABS (SUTURE) IMPLANT
SUT VICRYL 4-0 PS2 18IN ABS (SUTURE) ×2 IMPLANT
TOWEL OR 17X24 6PK STRL BLUE (TOWEL DISPOSABLE) ×4 IMPLANT
TROCAR Z-THREAD FIOS 11X100 BL (TROCAR) IMPLANT
TROCAR Z-THREAD FIOS 5X100MM (TROCAR) ×2 IMPLANT
WARMER LAPAROSCOPE (MISCELLANEOUS) ×2 IMPLANT
WATER STERILE IRR 1000ML POUR (IV SOLUTION) ×2 IMPLANT

## 2011-03-15 NOTE — Progress Notes (Signed)
Patient reports no change in status since H&P.  Will proceed with laparoscopy.

## 2011-03-15 NOTE — Preoperative (Addendum)
Beta Blockers   Reason not to administer Beta Blockers:Not Applicable 
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
## 2011-03-15 NOTE — Anesthesia Postprocedure Evaluation (Signed)
Anesthesia Post Note  Patient: [name]  Procedure(s) Performed:  LAPAROSCOPY DIAGNOSTIC - Operative Laparoscopy With Fulgueration Of Endometriosis  Patient is awake and responsive. Pain and nausea are reasonably well controlled. Vital signs are stable and clinically acceptable. Oxygen saturation is clinically acceptable. There are no apparent anesthetic complications at this time. Patient is ready for discharge.

## 2011-03-15 NOTE — Op Note (Signed)
Preoperative diagnosis: Chronic pelvic pain Postoperative diagnosis: Chronic pelvic pain, possible endometriosis Procedure: Diagnostic laparoscopy and fulguration of possible endometriosis Surgeon: Lavina Hamman M.D. Anesthesia: Gen. endotracheal tube Findings: She had a normal appearing uterus, tubes and ovaries, normal-appearing upper abdomen and appendix. She had 2 possible implants of endometriosis, one on the left uterosacral ligament and one in the left ovarian fossa Specimens: None Estimated blood loss: Minimal Complications: None  Procedure in detail: The patient was taken to the operating room and placed in the dorsosupine position. General anesthesia with was induced and she was placed in mobile stirrups. Abdomen was prepped and draped in usual sterile fashion, vagina prepped with Betadine and a Hulka tenaculum applied to the cervix for uterine manipulation, bladder drained of approximately 100 cc of urine. Infraumbilical skin was then infiltrated with quarter percent Marcaine and a 1 cm vertical incision was made. A Veress needle was then inserted into the peritoneal cavity and placement confirmed by the water drop test an opening pressure of 4 mm of mercury. CO2 was insufflated to a pressure of 12 mm mercury and the Veress needle was removed. A 5 mm trocar was then introduced with direct visualization with the laparoscope. A 5 mm port was then also placed on the left side under direct visualization to aid in manipulating organs. Inspection revealed the above-mentioned findings. No significant source for her pain was identified other than 2 possible small implants of endometriosis. These implants in the left ovarian fossa and left uterosacral ligament were fulgurated with bipolar cautery. Then no further abnormalities were identified. The 5 mm trocar in the left was removed under direct visualization. All gas was allowed to deflate from the abdomen and the umbilical trocar was removed. Skin  incisions were closed with interrupted subcuticular sutures of 4-0 Vicryl followed by Dermabond. The Hulka tenaculum was removed. The patient was awakened in the operating room and taken to the recovery room in stable condition. Counts were correct and she had PAS hose on throughout the procedure.

## 2011-03-15 NOTE — Transfer of Care (Signed)
  Anesthesia Post-op Note  Patient: [name]  Procedure(s) Performed:  LAPAROSCOPY DIAGNOSTIC - Operative Laparoscopy With Fulgueration Of Endometriosis  Patient Location: PACU  Anesthesia Type: General  Level of Consciousness: awake, alert , oriented and patient cooperative  Airway and Oxygen Therapy: Patient Spontanous Breathing and Patient connected to nasal cannula oxygen  Post-op Pain: mild  Post-op Assessment: Post-op Vital signs reviewed, Patient's Cardiovascular Status Stable, Respiratory Function Stable, Patent Airway and No signs of Nausea or vomiting  Post-op Vital Signs: Reviewed and stable  Complications: No apparent anesthesia complications

## 2011-03-16 NOTE — Addendum Note (Signed)
Addendum  created 03/16/11 1842 by Amy L. Cassidy   Modules edited:Charting, Inpatient Notes

## 2011-03-25 SURGERY — Surgical Case
Anesthesia: *Unknown

## 2011-03-30 ENCOUNTER — Encounter (HOSPITAL_COMMUNITY): Payer: Self-pay | Admitting: Obstetrics and Gynecology

## 2011-04-09 LAB — DIFFERENTIAL
Basophils Absolute: 0.1
Basophils Relative: 0
Eosinophils Absolute: 0.3
Eosinophils Relative: 2
Neutrophils Relative %: 66

## 2011-04-09 LAB — ETHANOL: Alcohol, Ethyl (B): <5

## 2011-04-09 LAB — CBC
MCHC: 34.4
MCV: 82.4
Platelets: 504 — ABNORMAL HIGH
RDW: 16.2 — ABNORMAL HIGH
WBC: 11.9 — ABNORMAL HIGH

## 2011-04-09 LAB — BASIC METABOLIC PANEL
BUN: 7
CO2: 26
Chloride: 103
Creatinine, Ser: 0.66
Glucose, Bld: 117 — ABNORMAL HIGH

## 2011-04-09 LAB — RAPID URINE DRUG SCREEN, HOSP PERFORMED
Benzodiazepines: POSITIVE — AB
Cocaine: NOT DETECTED
Opiates: NOT DETECTED

## 2011-04-16 LAB — POCT I-STAT, CHEM 8
Chloride: 109
Creatinine, Ser: 0.6
Glucose, Bld: 99
HCT: 42
Hemoglobin: 14.3
Potassium: 3.8
Sodium: 140

## 2011-04-16 LAB — CBC
Platelets: 369
RBC: 4.57
WBC: 9.5

## 2011-04-16 LAB — URINE MICROSCOPIC-ADD ON

## 2011-04-16 LAB — URINALYSIS, ROUTINE W REFLEX MICROSCOPIC
Bilirubin Urine: NEGATIVE
Nitrite: NEGATIVE
Specific Gravity, Urine: 1.028
Urobilinogen, UA: 0.2
pH: 5.5

## 2011-04-16 LAB — DIFFERENTIAL
Lymphs Abs: 1.1
Monocytes Relative: 5
Neutro Abs: 7.8 — ABNORMAL HIGH
Neutrophils Relative %: 83 — ABNORMAL HIGH

## 2011-04-21 LAB — DIFFERENTIAL
Basophils Absolute: 0.1
Basophils Relative: 1
Eosinophils Absolute: 0
Eosinophils Relative: 0
Lymphocytes Relative: 17
Lymphs Abs: 2.2
Monocytes Absolute: 0.5
Monocytes Relative: 4
Neutro Abs: 9.7 — ABNORMAL HIGH
Neutrophils Relative %: 77

## 2011-04-21 LAB — BASIC METABOLIC PANEL
BUN: 3 — ABNORMAL LOW
CO2: 20
Calcium: 9.8
Chloride: 108
Creatinine, Ser: 0.7
GFR calc Af Amer: >60
GFR calc non Af Amer: >60
Glucose, Bld: 121 — ABNORMAL HIGH
Potassium: 4.1
Sodium: 142

## 2011-04-21 LAB — HEPATIC FUNCTION PANEL
Alkaline Phosphatase: 67
Bilirubin, Direct: 0.1
Indirect Bilirubin: 0.4
Total Bilirubin: 0.5

## 2011-04-21 LAB — CBC
MCV: 88.8
Platelets: 495 — ABNORMAL HIGH
RBC: 5.18 — ABNORMAL HIGH
WBC: 12.6 — ABNORMAL HIGH

## 2011-04-21 LAB — RAPID URINE DRUG SCREEN, HOSP PERFORMED
Amphetamines: NOT DETECTED
Barbiturates: NOT DETECTED
Benzodiazepines: NOT DETECTED
Cocaine: NOT DETECTED
Opiates: POSITIVE — AB
Tetrahydrocannabinol: NOT DETECTED

## 2011-04-21 LAB — ACETAMINOPHEN LEVEL: Acetaminophen (Tylenol), Serum: <10 — ABNORMAL LOW

## 2011-04-21 LAB — POCT PREGNANCY, URINE: Preg Test, Ur: NEGATIVE

## 2011-04-21 LAB — POCT CARDIAC MARKERS: Myoglobin, poc: 203

## 2011-04-21 LAB — SALICYLATE LEVEL: Salicylate Lvl: <4

## 2011-04-29 ENCOUNTER — Emergency Department (HOSPITAL_COMMUNITY)
Admission: EM | Admit: 2011-04-29 | Discharge: 2011-04-29 | Disposition: A | Discharge status: Home / Self Care | Payer: Medicaid Other | Attending: Emergency Medicine | Admitting: Emergency Medicine

## 2011-04-29 ENCOUNTER — Emergency Department (HOSPITAL_COMMUNITY): Payer: Medicaid Other

## 2011-04-29 DIAGNOSIS — S0003XA Contusion of scalp, initial encounter: Secondary | ICD-10-CM | POA: Insufficient documentation

## 2011-04-29 DIAGNOSIS — S1093XA Contusion of unspecified part of neck, initial encounter: Secondary | ICD-10-CM | POA: Insufficient documentation

## 2011-04-29 DIAGNOSIS — M542 Cervicalgia: Secondary | ICD-10-CM | POA: Insufficient documentation

## 2011-04-29 DIAGNOSIS — M25539 Pain in unspecified wrist: Secondary | ICD-10-CM | POA: Insufficient documentation

## 2011-04-29 DIAGNOSIS — R51 Headache: Secondary | ICD-10-CM | POA: Insufficient documentation

## 2011-04-29 DIAGNOSIS — H9209 Otalgia, unspecified ear: Secondary | ICD-10-CM | POA: Insufficient documentation

## 2011-04-29 DIAGNOSIS — M545 Low back pain, unspecified: Secondary | ICD-10-CM | POA: Insufficient documentation

## 2011-04-29 DIAGNOSIS — F341 Dysthymic disorder: Secondary | ICD-10-CM | POA: Insufficient documentation

## 2011-04-29 DIAGNOSIS — H5789 Other specified disorders of eye and adnexa: Secondary | ICD-10-CM | POA: Insufficient documentation

## 2011-04-29 DIAGNOSIS — Z79899 Other long term (current) drug therapy: Secondary | ICD-10-CM | POA: Insufficient documentation

## 2011-04-29 DIAGNOSIS — F17200 Nicotine dependence, unspecified, uncomplicated: Secondary | ICD-10-CM | POA: Insufficient documentation

## 2011-05-02 ENCOUNTER — Emergency Department (HOSPITAL_COMMUNITY): Payer: Medicaid Other

## 2011-05-02 ENCOUNTER — Emergency Department (HOSPITAL_COMMUNITY)
Admission: EM | Admit: 2011-05-02 | Discharge: 2011-05-02 | Disposition: A | Discharge status: Home / Self Care | Payer: Medicaid Other | Attending: Emergency Medicine | Admitting: Emergency Medicine

## 2011-05-02 DIAGNOSIS — S0510XA Contusion of eyeball and orbital tissues, unspecified eye, initial encounter: Secondary | ICD-10-CM | POA: Insufficient documentation

## 2011-05-02 DIAGNOSIS — M25539 Pain in unspecified wrist: Secondary | ICD-10-CM | POA: Insufficient documentation

## 2011-05-02 DIAGNOSIS — F341 Dysthymic disorder: Secondary | ICD-10-CM | POA: Insufficient documentation

## 2011-05-02 DIAGNOSIS — M79609 Pain in unspecified limb: Secondary | ICD-10-CM | POA: Insufficient documentation

## 2011-05-02 DIAGNOSIS — Z79899 Other long term (current) drug therapy: Secondary | ICD-10-CM | POA: Insufficient documentation

## 2011-05-02 DIAGNOSIS — Y92009 Unspecified place in unspecified non-institutional (private) residence as the place of occurrence of the external cause: Secondary | ICD-10-CM | POA: Insufficient documentation

## 2011-05-02 DIAGNOSIS — F17200 Nicotine dependence, unspecified, uncomplicated: Secondary | ICD-10-CM | POA: Insufficient documentation

## 2011-05-02 DIAGNOSIS — R51 Headache: Secondary | ICD-10-CM | POA: Insufficient documentation

## 2011-05-06 LAB — I-STAT 8, (EC8 V) (CONVERTED LAB)
BUN: 3 — ABNORMAL LOW
BUN: 7
Bicarbonate: 23.5
Chloride: 106
Glucose, Bld: 82
HCT: 40
Hemoglobin: 13.3
Hemoglobin: 13.6
Operator id: 234111
Sodium: 139
Sodium: 142
pH, Ven: 7.478 — ABNORMAL HIGH

## 2011-05-06 LAB — BENZODIAZEPINE, QUANTITATIVE, URINE
Alprazolam (GC/LC/MS), ur confirm: NEGATIVE
Flurazepam GC/MS Conf: NEGATIVE

## 2011-05-06 LAB — HEPATIC FUNCTION PANEL
Bilirubin, Direct: 0.1
Indirect Bilirubin: 0.5
Total Bilirubin: 0.6

## 2011-05-06 LAB — URINALYSIS, ROUTINE W REFLEX MICROSCOPIC
Glucose, UA: NEGATIVE
Hgb urine dipstick: NEGATIVE
Ketones, ur: NEGATIVE
Protein, ur: NEGATIVE

## 2011-05-06 LAB — DIFFERENTIAL
Eosinophils Absolute: 0.2
Lymphs Abs: 2.6
Monocytes Absolute: 0.5
Monocytes Relative: 10
Neutrophils Relative %: 31 — ABNORMAL LOW

## 2011-05-06 LAB — DRUGS OF ABUSE SCREEN W/O ALC, ROUTINE URINE
Benzodiazepines.: POSITIVE — AB
Cocaine Metabolites: NEGATIVE
Methadone: NEGATIVE
Opiate Screen, Urine: NEGATIVE

## 2011-05-06 LAB — POCT I-STAT CREATININE
Creatinine, Ser: 0.6
Creatinine, Ser: 0.7
Operator id: 234111

## 2011-05-06 LAB — RAPID URINE DRUG SCREEN, HOSP PERFORMED: Cocaine: NOT DETECTED

## 2011-05-06 LAB — CBC
HCT: 36.1
Hemoglobin: 11.8 — ABNORMAL LOW
MCV: 79.8
RBC: 4.53
WBC: 4.9

## 2011-05-06 LAB — ETHANOL: Alcohol, Ethyl (B): <5

## 2011-06-19 ENCOUNTER — Emergency Department (INDEPENDENT_AMBULATORY_CARE_PROVIDER_SITE_OTHER): Payer: Medicaid Other

## 2011-06-19 ENCOUNTER — Encounter (HOSPITAL_COMMUNITY): Payer: Self-pay | Admitting: Emergency Medicine

## 2011-06-19 ENCOUNTER — Emergency Department (HOSPITAL_COMMUNITY)
Admission: EM | Admit: 2011-06-19 | Discharge: 2011-06-19 | Disposition: A | Discharge status: Home / Self Care | Payer: Medicaid Other | Source: Home / Self Care | Attending: Emergency Medicine | Admitting: Emergency Medicine

## 2011-06-19 DIAGNOSIS — S62009A Unspecified fracture of navicular [scaphoid] bone of unspecified wrist, initial encounter for closed fracture: Secondary | ICD-10-CM

## 2011-06-19 MED ORDER — HYDROCODONE-ACETAMINOPHEN 5-325 MG PO TABS: 2.0000 | ORAL_TABLET | ORAL | Status: AC | PRN

## 2011-06-19 MED ORDER — HYDROCODONE-ACETAMINOPHEN 5-325 MG PO TABS
ORAL_TABLET | ORAL | Status: AC
  Filled 2011-06-19: qty 2

## 2011-06-19 MED ORDER — HYDROCODONE-ACETAMINOPHEN 5-325 MG PO TABS
2.0000 | ORAL_TABLET | Freq: Once | ORAL | Status: AC
  Administered 2011-06-19: 2 via ORAL

## 2011-06-19 MED ORDER — IBUPROFEN 600 MG PO TABS: 600.0000 mg | ORAL_TABLET | Freq: Four times a day (QID) | ORAL | Status: AC | PRN
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
## 2011-06-19 NOTE — ED Notes (Signed)
Back hurst, migraine. Right Wrist hurts, had surgery 2006 and had metal rod put in. Re broke about 2 months ago. Fell yesterday and caught herself on it.

## 2011-06-22 ENCOUNTER — Telehealth (HOSPITAL_COMMUNITY): Payer: Self-pay | Admitting: *Deleted

## 2011-06-22 NOTE — ED Notes (Signed)
Pt. called on voicemail 3 times yesterday and twice today requesting a refill of her pain medication. Discussed with Esperanza Sheets PA and she said to inform pt. That we don't do refills of pain medications and she needs to f/u with [facility]. Vassie Moselle 06/22/2011

## 2012-08-23 ENCOUNTER — Emergency Department (HOSPITAL_COMMUNITY): Payer: Medicaid Other

## 2012-08-23 ENCOUNTER — Emergency Department (HOSPITAL_COMMUNITY)
Admission: EM | Admit: 2012-08-23 | Discharge: 2012-08-23 | Disposition: A | Discharge status: Home / Self Care | Payer: Medicaid Other | Attending: Emergency Medicine | Admitting: Emergency Medicine

## 2012-08-23 ENCOUNTER — Encounter (HOSPITAL_COMMUNITY): Payer: Self-pay | Admitting: *Deleted

## 2012-08-23 DIAGNOSIS — Z8709 Personal history of other diseases of the respiratory system: Secondary | ICD-10-CM | POA: Insufficient documentation

## 2012-08-23 DIAGNOSIS — R509 Fever, unspecified: Secondary | ICD-10-CM

## 2012-08-23 DIAGNOSIS — R6510 Systemic inflammatory response syndrome (SIRS) of non-infectious origin without acute organ dysfunction: Secondary | ICD-10-CM

## 2012-08-23 DIAGNOSIS — Z3202 Encounter for pregnancy test, result negative: Secondary | ICD-10-CM | POA: Insufficient documentation

## 2012-08-23 DIAGNOSIS — K219 Gastro-esophageal reflux disease without esophagitis: Secondary | ICD-10-CM | POA: Insufficient documentation

## 2012-08-23 DIAGNOSIS — F17200 Nicotine dependence, unspecified, uncomplicated: Secondary | ICD-10-CM | POA: Insufficient documentation

## 2012-08-23 DIAGNOSIS — Z8742 Personal history of other diseases of the female genital tract: Secondary | ICD-10-CM | POA: Insufficient documentation

## 2012-08-23 DIAGNOSIS — E876 Hypokalemia: Secondary | ICD-10-CM

## 2012-08-23 DIAGNOSIS — F411 Generalized anxiety disorder: Secondary | ICD-10-CM | POA: Insufficient documentation

## 2012-08-23 DIAGNOSIS — F329 Major depressive disorder, single episode, unspecified: Secondary | ICD-10-CM | POA: Insufficient documentation

## 2012-08-23 DIAGNOSIS — Z79899 Other long term (current) drug therapy: Secondary | ICD-10-CM | POA: Insufficient documentation

## 2012-08-23 DIAGNOSIS — F3289 Other specified depressive episodes: Secondary | ICD-10-CM | POA: Insufficient documentation

## 2012-08-23 DIAGNOSIS — R109 Unspecified abdominal pain: Secondary | ICD-10-CM

## 2012-08-23 DIAGNOSIS — N12 Tubulo-interstitial nephritis, not specified as acute or chronic: Secondary | ICD-10-CM

## 2012-08-23 HISTORY — DX: Endometriosis, unspecified: N80.9

## 2012-08-23 LAB — CBC WITH DIFFERENTIAL/PLATELET
Basophils Relative: 0 % (ref 0–1)
Eosinophils Absolute: 0.2 10*3/uL (ref 0.0–0.7)
Eosinophils Relative: 1 % (ref 0–5)
Lymphs Abs: 1.7 10*3/uL (ref 0.7–4.0)
MCH: 29.1 pg (ref 26.0–34.0)
MCHC: 35.3 g/dL (ref 30.0–36.0)
MCV: 82.4 fL (ref 78.0–100.0)
Monocytes Absolute: 1.8 10*3/uL — ABNORMAL HIGH (ref 0.1–1.0)
Neutrophils Relative %: 76 % (ref 43–77)
Platelets: 382 10*3/uL (ref 150–400)

## 2012-08-23 LAB — URINE MICROSCOPIC-ADD ON

## 2012-08-23 LAB — COMPREHENSIVE METABOLIC PANEL
ALT: 10 U/L (ref 0–35)
CO2: 19 mEq/L (ref 19–32)
Calcium: 8.7 mg/dL (ref 8.4–10.5)
Creatinine, Ser: 0.85 mg/dL (ref 0.50–1.10)
GFR calc Af Amer: >90 mL/min (ref >90)
GFR calc non Af Amer: 88 mL/min — ABNORMAL LOW (ref >90)
Glucose, Bld: 119 mg/dL — ABNORMAL HIGH (ref 70–99)
Sodium: 133 mEq/L — ABNORMAL LOW (ref 135–145)
Total Protein: 7.5 g/dL (ref 6.0–8.3)

## 2012-08-23 LAB — LIPASE, BLOOD: Lipase: 13 U/L (ref 11–59)

## 2012-08-23 LAB — URINALYSIS, ROUTINE W REFLEX MICROSCOPIC
Nitrite: POSITIVE — AB
Protein, ur: 30 mg/dL — AB
Specific Gravity, Urine: 1.008 (ref 1.005–1.030)
Urobilinogen, UA: 0.2 mg/dL (ref 0.0–1.0)

## 2012-08-23 LAB — WET PREP, GENITAL
Trich, Wet Prep: NONE SEEN
Yeast Wet Prep HPF POC: NONE SEEN

## 2012-08-23 MED ORDER — POTASSIUM CHLORIDE 10 MEQ/100ML IV SOLN
10.0000 meq | INTRAVENOUS | Status: DC
  Administered 2012-08-23 (×2): 10 meq via INTRAVENOUS
  Filled 2012-08-23 (×2): qty 100

## 2012-08-23 MED ORDER — ONDANSETRON HCL 4 MG PO TABS: 4.0000 mg | ORAL_TABLET | Freq: Three times a day (TID) | ORAL | Status: DC | PRN

## 2012-08-23 MED ORDER — DEXTROSE 5 % IV SOLN
1.0000 g | Freq: Once | INTRAVENOUS | Status: AC
  Administered 2012-08-23: 1 g via INTRAVENOUS
  Filled 2012-08-23: qty 10

## 2012-08-23 MED ORDER — POTASSIUM CHLORIDE CRYS ER 20 MEQ PO TBCR
40.0000 meq | EXTENDED_RELEASE_TABLET | Freq: Once | ORAL | Status: AC
  Administered 2012-08-23: 40 meq via ORAL
  Filled 2012-08-23: qty 2

## 2012-08-23 MED ORDER — ASPIRIN-ACETAMINOPHEN-CAFFEINE 250-250-65 MG PO TABS
1.0000 | ORAL_TABLET | Freq: Once | ORAL | Status: AC
  Administered 2012-08-23: 1 via ORAL
  Filled 2012-08-23: qty 1

## 2012-08-23 MED ORDER — LEVOFLOXACIN 750 MG PO TABS: 750.0000 mg | ORAL_TABLET | Freq: Every day | ORAL | Status: DC

## 2012-08-23 MED ORDER — ACETAMINOPHEN 325 MG PO TABS
325.0000 mg | ORAL_TABLET | Freq: Once | ORAL | Status: AC
  Administered 2012-08-23: 325 mg via ORAL
  Filled 2012-08-23: qty 1

## 2012-08-23 MED ORDER — MORPHINE SULFATE 4 MG/ML IJ SOLN
1.0000 mg | Freq: Once | INTRAMUSCULAR | Status: AC
  Administered 2012-08-23: 1 mg via INTRAVENOUS
  Filled 2012-08-23: qty 1

## 2012-08-23 MED ORDER — ONDANSETRON HCL 4 MG/2ML IJ SOLN
4.0000 mg | Freq: Once | INTRAMUSCULAR | Status: AC
  Administered 2012-08-23: 4 mg via INTRAVENOUS
  Filled 2012-08-23: qty 2

## 2012-08-23 MED ORDER — METRONIDAZOLE 500 MG PO TABS
2000.0000 mg | ORAL_TABLET | Freq: Once | ORAL | Status: AC
  Administered 2012-08-23: 2000 mg via ORAL
  Filled 2012-08-23: qty 4

## 2012-08-23 MED ORDER — HYDROCODONE-ACETAMINOPHEN 5-325 MG PO TABS: 1.0000 | ORAL_TABLET | Freq: Four times a day (QID) | ORAL | Status: DC | PRN

## 2012-08-23 NOTE — ED Notes (Signed)
Pt is here with headache, nausea, chills, right flank pain, right abdominal pain and hurts to take a deep breath and symptoms for one week
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
## 2012-08-23 NOTE — ED Provider Notes (Signed)
I saw and evaluated the patient, reviewed the resident's note and I agree with the findings and plan.  Flank pain x 1 week with urinary symptoms. Typical migraine headache. Febrile and tachycardic on arrival but not distressed.  Bilateral CVAT.  Abdomen soft. SIRS with +UA.  No vomiting in ED.  Improvement in HR and temp. Small nonobstructive stones on CT, d/w urology who will see in follow up.  No hydronephrosis or ureter stones.  Glynn Octave, MD 08/23/12 [phone]

## 2012-08-23 NOTE — ED Notes (Addendum)
Pt states it hurts her stomach to take a deep breath. NAD noted at this time. Pt states pain is sharp and hurts into back. Pt states she had some dysuria, but denies any at this time. Pt states she was "spotting" one day, not presently. Pt states she also has a right sided headache, "but that's where my migraines usually are." Pt states "I can't drink enough, I'm thirsty all the time." Tender to palpation on Bilateral flanks, and lower abdomen

## 2012-08-24 LAB — GC/CHLAMYDIA PROBE AMP: CT Probe RNA: NEGATIVE

## 2012-08-24 NOTE — ED Provider Notes (Signed)
I saw and evaluated the patient, reviewed the resident's note and I agree with the findings and plan.  See my additional note  Glynn Octave, MD 08/24/12 [phone]

## 2012-08-25 LAB — URINE CULTURE

## 2012-08-26 NOTE — ED Notes (Signed)
+  Urine. Patient treated with Levaquin. Sensitive to same. Per protocol MD. °

## 2013-01-19 ENCOUNTER — Emergency Department (HOSPITAL_COMMUNITY): Payer: Medicaid Other

## 2013-01-19 ENCOUNTER — Encounter (HOSPITAL_COMMUNITY): Payer: Self-pay | Admitting: *Deleted

## 2013-01-19 ENCOUNTER — Emergency Department (HOSPITAL_COMMUNITY)
Admission: EM | Admit: 2013-01-19 | Discharge: 2013-01-19 | Disposition: A | Discharge status: Home / Self Care | Payer: Medicaid Other | Attending: Emergency Medicine | Admitting: Emergency Medicine

## 2013-01-19 DIAGNOSIS — Y9289 Other specified places as the place of occurrence of the external cause: Secondary | ICD-10-CM | POA: Insufficient documentation

## 2013-01-19 DIAGNOSIS — IMO0002 Reserved for concepts with insufficient information to code with codable children: Secondary | ICD-10-CM | POA: Insufficient documentation

## 2013-01-19 DIAGNOSIS — S0993XA Unspecified injury of face, initial encounter: Secondary | ICD-10-CM | POA: Insufficient documentation

## 2013-01-19 DIAGNOSIS — Y93E5 Activity, floor mopping and cleaning: Secondary | ICD-10-CM | POA: Insufficient documentation

## 2013-01-19 DIAGNOSIS — W19XXXA Unspecified fall, initial encounter: Secondary | ICD-10-CM

## 2013-01-19 DIAGNOSIS — H538 Other visual disturbances: Secondary | ICD-10-CM | POA: Insufficient documentation

## 2013-01-19 DIAGNOSIS — S060X9A Concussion with loss of consciousness of unspecified duration, initial encounter: Secondary | ICD-10-CM | POA: Insufficient documentation

## 2013-01-19 DIAGNOSIS — Z8742 Personal history of other diseases of the female genital tract: Secondary | ICD-10-CM | POA: Insufficient documentation

## 2013-01-19 DIAGNOSIS — F3289 Other specified depressive episodes: Secondary | ICD-10-CM | POA: Insufficient documentation

## 2013-01-19 DIAGNOSIS — W010XXA Fall on same level from slipping, tripping and stumbling without subsequent striking against object, initial encounter: Secondary | ICD-10-CM | POA: Insufficient documentation

## 2013-01-19 DIAGNOSIS — Z8709 Personal history of other diseases of the respiratory system: Secondary | ICD-10-CM | POA: Insufficient documentation

## 2013-01-19 DIAGNOSIS — F329 Major depressive disorder, single episode, unspecified: Secondary | ICD-10-CM | POA: Insufficient documentation

## 2013-01-19 DIAGNOSIS — F411 Generalized anxiety disorder: Secondary | ICD-10-CM | POA: Insufficient documentation

## 2013-01-19 DIAGNOSIS — R000 Tachycardia, unspecified: Secondary | ICD-10-CM | POA: Insufficient documentation

## 2013-01-19 DIAGNOSIS — K219 Gastro-esophageal reflux disease without esophagitis: Secondary | ICD-10-CM | POA: Insufficient documentation

## 2013-01-19 DIAGNOSIS — F17200 Nicotine dependence, unspecified, uncomplicated: Secondary | ICD-10-CM | POA: Insufficient documentation

## 2013-01-19 DIAGNOSIS — Z79899 Other long term (current) drug therapy: Secondary | ICD-10-CM | POA: Insufficient documentation

## 2013-01-19 MED ORDER — IBUPROFEN 800 MG PO TABS: 800.0000 mg | ORAL_TABLET | Freq: Three times a day (TID) | ORAL | Status: DC

## 2013-01-19 NOTE — ED Notes (Signed)
Pt states she was cleaning and tripped over something, she is unsure of how she fell but she states she hit her head on the floor and may have passed out. Pt also states she was having some blurry vision, back and neck pain. Pt ambulated to room with no problem.

## 2013-01-19 NOTE — ED Notes (Signed)
Pt returned from xray
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
## 2013-01-19 NOTE — ED Notes (Signed)
Discharge instructions reviewed. Pt verbalized understanding.  
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
## 2013-03-07 ENCOUNTER — Emergency Department (HOSPITAL_COMMUNITY)
Admission: EM | Admit: 2013-03-07 | Discharge: 2013-03-08 | Disposition: A | Discharge status: Home / Self Care | Payer: Medicaid Other | Attending: Emergency Medicine | Admitting: Emergency Medicine

## 2013-03-07 ENCOUNTER — Emergency Department (HOSPITAL_COMMUNITY): Payer: Medicaid Other

## 2013-03-07 ENCOUNTER — Encounter (HOSPITAL_COMMUNITY): Payer: Self-pay | Admitting: Emergency Medicine

## 2013-03-07 DIAGNOSIS — Y92009 Unspecified place in unspecified non-institutional (private) residence as the place of occurrence of the external cause: Secondary | ICD-10-CM | POA: Insufficient documentation

## 2013-03-07 DIAGNOSIS — S0990XA Unspecified injury of head, initial encounter: Secondary | ICD-10-CM | POA: Insufficient documentation

## 2013-03-07 DIAGNOSIS — R42 Dizziness and giddiness: Secondary | ICD-10-CM | POA: Insufficient documentation

## 2013-03-07 DIAGNOSIS — Z8742 Personal history of other diseases of the female genital tract: Secondary | ICD-10-CM | POA: Insufficient documentation

## 2013-03-07 DIAGNOSIS — N390 Urinary tract infection, site not specified: Secondary | ICD-10-CM

## 2013-03-07 DIAGNOSIS — Y9389 Activity, other specified: Secondary | ICD-10-CM | POA: Insufficient documentation

## 2013-03-07 DIAGNOSIS — Z79899 Other long term (current) drug therapy: Secondary | ICD-10-CM | POA: Insufficient documentation

## 2013-03-07 DIAGNOSIS — F17200 Nicotine dependence, unspecified, uncomplicated: Secondary | ICD-10-CM | POA: Insufficient documentation

## 2013-03-07 DIAGNOSIS — E860 Dehydration: Secondary | ICD-10-CM

## 2013-03-07 DIAGNOSIS — Z8709 Personal history of other diseases of the respiratory system: Secondary | ICD-10-CM | POA: Insufficient documentation

## 2013-03-07 DIAGNOSIS — G8929 Other chronic pain: Secondary | ICD-10-CM | POA: Insufficient documentation

## 2013-03-07 DIAGNOSIS — K219 Gastro-esophageal reflux disease without esophagitis: Secondary | ICD-10-CM | POA: Insufficient documentation

## 2013-03-07 DIAGNOSIS — Z3202 Encounter for pregnancy test, result negative: Secondary | ICD-10-CM | POA: Insufficient documentation

## 2013-03-07 DIAGNOSIS — W1809XA Striking against other object with subsequent fall, initial encounter: Secondary | ICD-10-CM | POA: Insufficient documentation

## 2013-03-07 DIAGNOSIS — W108XXA Fall (on) (from) other stairs and steps, initial encounter: Secondary | ICD-10-CM | POA: Insufficient documentation

## 2013-03-07 DIAGNOSIS — Z8659 Personal history of other mental and behavioral disorders: Secondary | ICD-10-CM | POA: Insufficient documentation

## 2013-03-07 LAB — CBC WITH DIFFERENTIAL/PLATELET
Basophils Absolute: 0.1 10*3/uL (ref 0.0–0.1)
HCT: 41.7 % (ref 36.0–46.0)
Lymphocytes Relative: 37 % (ref 12–46)
Neutro Abs: 3.6 10*3/uL (ref 1.7–7.7)
Platelets: 371 10*3/uL (ref 150–400)
RBC: 4.84 MIL/uL (ref 3.87–5.11)
RDW: 13.2 % (ref 11.5–15.5)
WBC: 7.6 10*3/uL (ref 4.0–10.5)

## 2013-03-07 LAB — BASIC METABOLIC PANEL
CO2: 23 mEq/L (ref 19–32)
Chloride: 99 mEq/L (ref 96–112)
Sodium: 134 mEq/L — ABNORMAL LOW (ref 135–145)

## 2013-03-07 LAB — URINE MICROSCOPIC-ADD ON

## 2013-03-07 LAB — URINALYSIS, ROUTINE W REFLEX MICROSCOPIC
Glucose, UA: NEGATIVE mg/dL
Specific Gravity, Urine: 1.014 (ref 1.005–1.030)
pH: 5.5 (ref 5.0–8.0)

## 2013-03-07 LAB — POCT PREGNANCY, URINE: Preg Test, Ur: NEGATIVE

## 2013-03-07 MED ORDER — SODIUM CHLORIDE 0.9 % IV BOLUS (SEPSIS)
1000.0000 mL | Freq: Once | INTRAVENOUS | Status: AC
  Administered 2013-03-07: 1000 mL via INTRAVENOUS

## 2013-03-07 MED ORDER — NAPROXEN 500 MG PO TABS: 500.0000 mg | ORAL_TABLET | Freq: Two times a day (BID) | ORAL | Status: DC

## 2013-03-07 MED ORDER — NAPROXEN 250 MG PO TABS
500.0000 mg | ORAL_TABLET | Freq: Once | ORAL | Status: AC
  Administered 2013-03-08: 500 mg via ORAL
  Filled 2013-03-07: qty 2

## 2013-03-07 NOTE — ED Notes (Signed)
No changes, pt alert, NAD, calm, interactive, family at [facility], VSS, IV site U, pending CT, CT contacted, CT aware, "will be shortly", c-collar remains in place, pt tolerating supine with HOB 20 degrees.
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
## 2013-03-07 NOTE — ED Notes (Signed)
Pt to CT

## 2013-03-07 NOTE — ED Notes (Signed)
PT. TRIPPED AND FELL TODAY AT HOME , NO LOC/ AMBULATORY , REPORTS NECK PAIN , BILATERAL SHOULDER PAIN AND LOW BACK PAIN , PT. ALSO REPORTS INSOMNIA FOR 3 DAYS WITH POOR APPETITE , DENIES SUICIDAL IDEATION / NO HALLUCINATIONS .

## 2013-03-07 NOTE — ED Notes (Signed)
Returned from CT, no changes, moved to room D32, family at [facility], c-collar remains in place, rates continued pain 9/10. Alert, NAD, calm, interactive, resps e/u, speaking in clear complete sentences.

## 2013-03-08 MED ORDER — NITROFURANTOIN MONOHYD MACRO 100 MG PO CAPS
100.0000 mg | ORAL_CAPSULE | Freq: Once | ORAL | Status: AC
  Administered 2013-03-08: 100 mg via ORAL
  Filled 2013-03-08: qty 1

## 2013-03-08 MED ORDER — NITROFURANTOIN MONOHYD MACRO 100 MG PO CAPS: 100.0000 mg | ORAL_CAPSULE | Freq: Two times a day (BID) | ORAL | Status: DC

## 2013-03-09 LAB — URINE CULTURE: Culture: NO GROWTH

## 2013-05-11 ENCOUNTER — Emergency Department (HOSPITAL_COMMUNITY)
Admission: EM | Admit: 2013-05-11 | Discharge: 2013-05-11 | Disposition: A | Discharge status: Home / Self Care | Payer: Medicaid Other | Attending: Emergency Medicine | Admitting: Emergency Medicine

## 2013-05-11 ENCOUNTER — Emergency Department (HOSPITAL_COMMUNITY): Payer: Medicaid Other

## 2013-05-11 ENCOUNTER — Encounter (HOSPITAL_COMMUNITY): Payer: Self-pay | Admitting: Emergency Medicine

## 2013-05-11 DIAGNOSIS — N12 Tubulo-interstitial nephritis, not specified as acute or chronic: Secondary | ICD-10-CM

## 2013-05-11 DIAGNOSIS — K219 Gastro-esophageal reflux disease without esophagitis: Secondary | ICD-10-CM | POA: Insufficient documentation

## 2013-05-11 DIAGNOSIS — R1013 Epigastric pain: Secondary | ICD-10-CM | POA: Insufficient documentation

## 2013-05-11 DIAGNOSIS — Z8659 Personal history of other mental and behavioral disorders: Secondary | ICD-10-CM | POA: Insufficient documentation

## 2013-05-11 DIAGNOSIS — F17200 Nicotine dependence, unspecified, uncomplicated: Secondary | ICD-10-CM | POA: Insufficient documentation

## 2013-05-11 DIAGNOSIS — Z3202 Encounter for pregnancy test, result negative: Secondary | ICD-10-CM | POA: Insufficient documentation

## 2013-05-11 DIAGNOSIS — Z8709 Personal history of other diseases of the respiratory system: Secondary | ICD-10-CM | POA: Insufficient documentation

## 2013-05-11 DIAGNOSIS — Z87442 Personal history of urinary calculi: Secondary | ICD-10-CM | POA: Insufficient documentation

## 2013-05-11 DIAGNOSIS — Z8742 Personal history of other diseases of the female genital tract: Secondary | ICD-10-CM | POA: Insufficient documentation

## 2013-05-11 DIAGNOSIS — Z79899 Other long term (current) drug therapy: Secondary | ICD-10-CM | POA: Insufficient documentation

## 2013-05-11 DIAGNOSIS — Z87440 Personal history of urinary (tract) infections: Secondary | ICD-10-CM | POA: Insufficient documentation

## 2013-05-11 HISTORY — DX: Disorder of kidney and ureter, unspecified: N28.9

## 2013-05-11 LAB — COMPREHENSIVE METABOLIC PANEL
ALT: 100 U/L — ABNORMAL HIGH (ref 0–35)
AST: 76 U/L — ABNORMAL HIGH (ref 0–37)
Alkaline Phosphatase: 63 U/L (ref 39–117)
CO2: 23 mEq/L (ref 19–32)
Chloride: 106 mEq/L (ref 96–112)
Creatinine, Ser: 0.62 mg/dL (ref 0.50–1.10)
GFR calc non Af Amer: >90 mL/min (ref >90)
Sodium: 139 mEq/L (ref 135–145)
Total Bilirubin: 0.3 mg/dL (ref 0.3–1.2)

## 2013-05-11 LAB — CBC WITH DIFFERENTIAL/PLATELET
Basophils Absolute: 0 10*3/uL (ref 0.0–0.1)
HCT: 41 % (ref 36.0–46.0)
Hemoglobin: 13.9 g/dL (ref 12.0–15.0)
Lymphocytes Relative: 34 % (ref 12–46)
Monocytes Absolute: 0.3 10*3/uL (ref 0.1–1.0)
Neutro Abs: 2.7 10*3/uL (ref 1.7–7.7)
RDW: 13 % (ref 11.5–15.5)
WBC: 5 10*3/uL (ref 4.0–10.5)

## 2013-05-11 LAB — URINE MICROSCOPIC-ADD ON

## 2013-05-11 LAB — URINALYSIS, ROUTINE W REFLEX MICROSCOPIC
Bilirubin Urine: NEGATIVE
Glucose, UA: NEGATIVE mg/dL
Nitrite: POSITIVE — AB
Protein, ur: NEGATIVE mg/dL
Specific Gravity, Urine: 1.01 (ref 1.005–1.030)
pH: 6 (ref 5.0–8.0)

## 2013-05-11 LAB — LIPASE, BLOOD: Lipase: 27 U/L (ref 11–59)

## 2013-05-11 LAB — POCT PREGNANCY, URINE: Preg Test, Ur: NEGATIVE

## 2013-05-11 MED ORDER — DEXTROSE 5 % IV SOLN
1.0000 g | Freq: Once | INTRAVENOUS | Status: AC
  Administered 2013-05-11: 1 g via INTRAVENOUS
  Filled 2013-05-11: qty 10

## 2013-05-11 MED ORDER — ONDANSETRON HCL 4 MG/2ML IJ SOLN
4.0000 mg | Freq: Once | INTRAMUSCULAR | Status: AC
  Administered 2013-05-11: 4 mg via INTRAVENOUS
  Filled 2013-05-11: qty 2

## 2013-05-11 MED ORDER — CEPHALEXIN 500 MG PO CAPS: 500.0000 mg | ORAL_CAPSULE | Freq: Four times a day (QID) | ORAL | Status: DC

## 2013-05-11 MED ORDER — HYDROMORPHONE HCL PF 1 MG/ML IJ SOLN
1.0000 mg | Freq: Once | INTRAMUSCULAR | Status: AC
  Administered 2013-05-11: 1 mg via INTRAVENOUS
  Filled 2013-05-11: qty 1

## 2013-05-11 MED ORDER — LORAZEPAM 2 MG/ML IJ SOLN: 2.0000 mg | Freq: Once | INTRAMUSCULAR | Status: DC

## 2013-05-11 MED ORDER — HYDROCODONE-ACETAMINOPHEN 5-325 MG PO TABS: 1.0000 | ORAL_TABLET | Freq: Four times a day (QID) | ORAL | Status: DC | PRN
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
## 2013-05-11 NOTE — ED Provider Notes (Signed)
Medical screening examination/treatment/procedure(s) were performed by non-physician practitioner and as supervising physician I was immediately available for consultation/collaboration.  EKG Interpretation   None        Shon Baton, MD 05/11/13 [phone]

## 2013-05-11 NOTE — ED Notes (Signed)
Pt with R flank pain and emesis x 1 week.  States hx of renal calculi and s/s feel the same.  Also c/o burning sensation when urinating and states urine is dark.

## 2013-05-11 NOTE — ED Notes (Signed)
Pt provided a coke  

## 2013-05-11 NOTE — ED Notes (Signed)
Patient transported to CT 

## 2013-05-13 LAB — URINE CULTURE: Colony Count: >=100000

## 2013-07-07 ENCOUNTER — Encounter (HOSPITAL_COMMUNITY): Payer: Self-pay | Admitting: Emergency Medicine

## 2013-07-07 ENCOUNTER — Emergency Department (HOSPITAL_COMMUNITY)
Admission: EM | Admit: 2013-07-07 | Discharge: 2013-07-07 | Disposition: A | Discharge status: Home / Self Care | Payer: Medicaid Other | Attending: Emergency Medicine | Admitting: Emergency Medicine

## 2013-07-07 DIAGNOSIS — F17200 Nicotine dependence, unspecified, uncomplicated: Secondary | ICD-10-CM | POA: Insufficient documentation

## 2013-07-07 DIAGNOSIS — F3289 Other specified depressive episodes: Secondary | ICD-10-CM | POA: Insufficient documentation

## 2013-07-07 DIAGNOSIS — F329 Major depressive disorder, single episode, unspecified: Secondary | ICD-10-CM | POA: Insufficient documentation

## 2013-07-07 DIAGNOSIS — Z79899 Other long term (current) drug therapy: Secondary | ICD-10-CM | POA: Insufficient documentation

## 2013-07-07 DIAGNOSIS — Z87442 Personal history of urinary calculi: Secondary | ICD-10-CM | POA: Insufficient documentation

## 2013-07-07 DIAGNOSIS — R300 Dysuria: Secondary | ICD-10-CM | POA: Insufficient documentation

## 2013-07-07 DIAGNOSIS — R000 Tachycardia, unspecified: Secondary | ICD-10-CM | POA: Insufficient documentation

## 2013-07-07 DIAGNOSIS — Z3202 Encounter for pregnancy test, result negative: Secondary | ICD-10-CM | POA: Insufficient documentation

## 2013-07-07 DIAGNOSIS — N390 Urinary tract infection, site not specified: Secondary | ICD-10-CM | POA: Insufficient documentation

## 2013-07-07 DIAGNOSIS — K219 Gastro-esophageal reflux disease without esophagitis: Secondary | ICD-10-CM | POA: Insufficient documentation

## 2013-07-07 DIAGNOSIS — F411 Generalized anxiety disorder: Secondary | ICD-10-CM | POA: Insufficient documentation

## 2013-07-07 LAB — URINE MICROSCOPIC-ADD ON

## 2013-07-07 LAB — URINALYSIS, ROUTINE W REFLEX MICROSCOPIC
Glucose, UA: NEGATIVE mg/dL
Ketones, ur: NEGATIVE mg/dL
Protein, ur: NEGATIVE mg/dL
Specific Gravity, Urine: 1.009 (ref 1.005–1.030)
Urobilinogen, UA: 0.2 mg/dL (ref 0.0–1.0)

## 2013-07-07 LAB — RAPID URINE DRUG SCREEN, HOSP PERFORMED
Amphetamines: NOT DETECTED
Barbiturates: NOT DETECTED
Benzodiazepines: NOT DETECTED

## 2013-07-07 LAB — POCT PREGNANCY, URINE: Preg Test, Ur: NEGATIVE

## 2013-07-07 MED ORDER — DEXTROSE 5 % IV SOLN
1.0000 g | INTRAVENOUS | Status: DC
  Administered 2013-07-07: 18:00:00 via INTRAVENOUS
  Filled 2013-07-07: qty 10

## 2013-07-07 MED ORDER — SODIUM CHLORIDE 0.9 % IV SOLN
INTRAVENOUS | Status: DC
  Administered 2013-07-07: 20:00:00 via INTRAVENOUS

## 2013-07-07 MED ORDER — MORPHINE SULFATE 4 MG/ML IJ SOLN
6.0000 mg | Freq: Once | INTRAMUSCULAR | Status: AC
  Administered 2013-07-07: 6 mg via INTRAVENOUS
  Filled 2013-07-07: qty 2

## 2013-07-07 MED ORDER — CEPHALEXIN 500 MG PO CAPS: 500.0000 mg | ORAL_CAPSULE | Freq: Four times a day (QID) | ORAL | Status: DC

## 2013-07-07 MED ORDER — OXYCODONE-ACETAMINOPHEN 5-325 MG PO TABS
2.0000 | ORAL_TABLET | Freq: Once | ORAL | Status: AC
  Administered 2013-07-07: 2 via ORAL
  Filled 2013-07-07: qty 2

## 2013-07-07 MED ORDER — OXYCODONE-ACETAMINOPHEN 5-325 MG PO TABS: 2.0000 | ORAL_TABLET | ORAL | Status: DC | PRN

## 2013-07-07 MED ORDER — FLUCONAZOLE 150 MG PO TABS
150.0000 mg | ORAL_TABLET | Freq: Once | ORAL | Status: AC
  Administered 2013-07-07: 150 mg via ORAL
  Filled 2013-07-07: qty 1

## 2013-07-07 MED ORDER — SODIUM CHLORIDE 0.9 % IV BOLUS (SEPSIS)
1000.0000 mL | Freq: Once | INTRAVENOUS | Status: AC
  Administered 2013-07-07: 1000 mL via INTRAVENOUS
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
## 2013-07-07 NOTE — ED Notes (Signed)
Bed: WA06 Expected date:  Expected time:  Means of arrival:  Comments: 

## 2013-07-07 NOTE — ED Notes (Signed)
Pt reports green-colored vaginal discharge that started 3 weeks ago. Also reports  a foul-smelling odor associated with discharge.small amount of Light green discharge is noted on sanitary pad that pt currently has on.

## 2013-07-07 NOTE — ED Notes (Signed)
Pt presents with c/o right sided flank pain and painful urination. Pt said her symptoms started 3-4 days ago. Pt unsure whether she has blood in her urine.

## 2013-07-10 LAB — URINE CULTURE

## 2013-07-11 NOTE — Progress Notes (Signed)
ED Antimicrobial Stewardship Positive Culture Follow Up   [name] is an 37 y.o. female who presented to [facility] on 07/07/2013 with a chief complaint of  Chief Complaint  Patient presents with  . Flank Pain  . Dysuria    Recent Results (from the past 720 hour(s))  URINE CULTURE     Status: None   Collection Time    07/07/13  5:05 PM      Result Value Range Status   Specimen Description URINE, CLEAN CATCH   Final   Special Requests NONE   Final   Culture  Setup Time     Final   Value: 07/07/2013 21:15     Performed at [facility] Count     Final   Value: >=100,000 COLONIES/ML     Performed at [facility]   Culture     Final   Value: STAPHYLOCOCCUS SPECIES (COAGULASE NEGATIVE)     Note: RIFAMPIN AND GENTAMICIN SHOULD NOT BE USED AS SINGLE DRUGS FOR TREATMENT OF STAPH INFECTIONS.     Performed at [facility]   Report Status 07/10/2013 FINAL   Final   Organism ID, Bacteria STAPHYLOCOCCUS SPECIES (COAGULASE NEGATIVE)   Final    [x]  Treated with Keflex, organism resistant to prescribed antimicrobial []  Patient discharged originally without antimicrobial agent and treatment is now indicated  New antibiotic prescription: Macrobid 100mg  PO BID x 7 days  ED Provider: Francee Piccolo, PA-C   Cleon Dew 07/11/2013, 10:24 AM Infectious Diseases Pharmacist Phone# [phone]

## 2013-07-11 NOTE — ED Notes (Signed)
Post ED Visit - Positive Culture Follow-up: Successful Patient Follow-Up  Culture assessed and recommendations reviewed by: [x]  Wes Dulaney, Pharm.D., BCPS []  Celedonio Miyamoto, Pharm.D., BCPS []  Georgina Pillion, Pharm.D., BCPS []  [place], [place].D., BCPS, AAHIVP []  Estella Husk, Pharm.D., BCPS, AAHIVP  Positive urine culture  [X]  Treated with Keflex, organism resistant to prescribed antimicrobial  [ ]  Patient discharged originally without antimicrobial agent and treatment is now indicated  New antibiotic prescription: Macrobid 100mg  PO BID x 7 days  ED Provider: Francee Piccolo, PA-C     Larena Sox 07/11/2013, 1:07 PM

## 2013-07-13 ENCOUNTER — Telehealth (HOSPITAL_COMMUNITY): Payer: Self-pay | Admitting: *Deleted

## 2013-07-13 NOTE — ED Notes (Signed)
Patient wants rx called to  [facility] [phone] by Carollee Herter PFM

## 2013-09-16 ENCOUNTER — Encounter (HOSPITAL_COMMUNITY): Payer: Self-pay | Admitting: Emergency Medicine

## 2013-09-16 ENCOUNTER — Emergency Department (HOSPITAL_COMMUNITY)
Admission: EM | Admit: 2013-09-16 | Discharge: 2013-09-17 | Disposition: A | Discharge status: Home / Self Care | Payer: Medicaid Other | Attending: Emergency Medicine | Admitting: Emergency Medicine

## 2013-09-16 DIAGNOSIS — Z87440 Personal history of urinary (tract) infections: Secondary | ICD-10-CM | POA: Insufficient documentation

## 2013-09-16 DIAGNOSIS — Z8742 Personal history of other diseases of the female genital tract: Secondary | ICD-10-CM | POA: Insufficient documentation

## 2013-09-16 DIAGNOSIS — Z79899 Other long term (current) drug therapy: Secondary | ICD-10-CM | POA: Insufficient documentation

## 2013-09-16 DIAGNOSIS — K219 Gastro-esophageal reflux disease without esophagitis: Secondary | ICD-10-CM | POA: Insufficient documentation

## 2013-09-16 DIAGNOSIS — Z3202 Encounter for pregnancy test, result negative: Secondary | ICD-10-CM | POA: Insufficient documentation

## 2013-09-16 DIAGNOSIS — F17200 Nicotine dependence, unspecified, uncomplicated: Secondary | ICD-10-CM | POA: Insufficient documentation

## 2013-09-16 DIAGNOSIS — N12 Tubulo-interstitial nephritis, not specified as acute or chronic: Secondary | ICD-10-CM | POA: Insufficient documentation

## 2013-09-16 DIAGNOSIS — Z8659 Personal history of other mental and behavioral disorders: Secondary | ICD-10-CM | POA: Insufficient documentation

## 2013-09-16 DIAGNOSIS — Z87442 Personal history of urinary calculi: Secondary | ICD-10-CM | POA: Insufficient documentation

## 2013-09-16 DIAGNOSIS — Z8709 Personal history of other diseases of the respiratory system: Secondary | ICD-10-CM | POA: Insufficient documentation

## 2013-09-16 LAB — URINALYSIS, ROUTINE W REFLEX MICROSCOPIC
Glucose, UA: NEGATIVE mg/dL
KETONES UR: 15 mg/dL — AB
NITRITE: NEGATIVE
PH: 5.5 (ref 5.0–8.0)
PROTEIN: NEGATIVE mg/dL
Specific Gravity, Urine: 1.024 (ref 1.005–1.030)
UROBILINOGEN UA: 1 mg/dL (ref 0.0–1.0)

## 2013-09-16 LAB — I-STAT CHEM 8, ED
BUN: 10 mg/dL (ref 6–23)
CALCIUM ION: 1.15 mmol/L (ref 1.12–1.23)
CHLORIDE: 106 meq/L (ref 96–112)
Creatinine, Ser: 0.7 mg/dL (ref 0.50–1.10)
Glucose, Bld: 110 mg/dL — ABNORMAL HIGH (ref 70–99)
HEMATOCRIT: 47 % — AB (ref 36.0–46.0)
Hemoglobin: 16 g/dL — ABNORMAL HIGH (ref 12.0–15.0)
POTASSIUM: 3.7 meq/L (ref 3.7–5.3)
SODIUM: 142 meq/L (ref 137–147)
TCO2: 26 mmol/L (ref 0–100)

## 2013-09-16 LAB — CBC WITH DIFFERENTIAL/PLATELET
BASOS ABS: 0 10*3/uL (ref 0.0–0.1)
BASOS PCT: 0 % (ref 0–1)
Eosinophils Absolute: 0.2 10*3/uL (ref 0.0–0.7)
Eosinophils Relative: 1 % (ref 0–5)
HCT: 44.8 % (ref 36.0–46.0)
Hemoglobin: 15.6 g/dL — ABNORMAL HIGH (ref 12.0–15.0)
LYMPHS PCT: 27 % (ref 12–46)
Lymphs Abs: 3.1 10*3/uL (ref 0.7–4.0)
MCH: 30 pg (ref 26.0–34.0)
MCHC: 34.8 g/dL (ref 30.0–36.0)
MCV: 86.2 fL (ref 78.0–100.0)
Monocytes Absolute: 0.6 10*3/uL (ref 0.1–1.0)
Monocytes Relative: 5 % (ref 3–12)
NEUTROS ABS: 7.4 10*3/uL (ref 1.7–7.7)
NEUTROS PCT: 66 % (ref 43–77)
Platelets: 381 10*3/uL (ref 150–400)
RBC: 5.2 MIL/uL — ABNORMAL HIGH (ref 3.87–5.11)
RDW: 12.6 % (ref 11.5–15.5)
WBC: 11.3 10*3/uL — AB (ref 4.0–10.5)

## 2013-09-16 LAB — URINE MICROSCOPIC-ADD ON

## 2013-09-16 LAB — POC URINE PREG, ED: Preg Test, Ur: NEGATIVE

## 2013-09-16 NOTE — ED Notes (Signed)
Patient with abdominal pain and nausea and vomiting.  Patient states that the pain is sharp in nature, radiating into her back.  This started yesterday.

## 2013-09-17 ENCOUNTER — Emergency Department (HOSPITAL_COMMUNITY): Payer: Medicaid Other

## 2013-09-17 LAB — COMPREHENSIVE METABOLIC PANEL
ALK PHOS: 71 U/L (ref 39–117)
ALT: 43 U/L — ABNORMAL HIGH (ref 0–35)
AST: 37 U/L (ref 0–37)
Albumin: 4.1 g/dL (ref 3.5–5.2)
BILIRUBIN TOTAL: 0.6 mg/dL (ref 0.3–1.2)
BUN: 10 mg/dL (ref 6–23)
CHLORIDE: 98 meq/L (ref 96–112)
CO2: 22 meq/L (ref 19–32)
Calcium: 9.6 mg/dL (ref 8.4–10.5)
Creatinine, Ser: 0.64 mg/dL (ref 0.50–1.10)
GLUCOSE: 91 mg/dL (ref 70–99)
POTASSIUM: 3.4 meq/L — AB (ref 3.7–5.3)
Sodium: 137 mEq/L (ref 137–147)
Total Protein: 8.6 g/dL — ABNORMAL HIGH (ref 6.0–8.3)

## 2013-09-17 MED ORDER — ONDANSETRON 4 MG PO TBDP: ORAL_TABLET | ORAL | Status: DC

## 2013-09-17 MED ORDER — IOHEXOL 300 MG/ML  SOLN: 20.0000 mL | Freq: Once | INTRAMUSCULAR | Status: DC | PRN

## 2013-09-17 MED ORDER — IOHEXOL 300 MG/ML  SOLN
20.0000 mL | INTRAMUSCULAR | Status: DC
  Administered 2013-09-17: 20 mL via ORAL

## 2013-09-17 MED ORDER — IOHEXOL 300 MG/ML  SOLN
100.0000 mL | Freq: Once | INTRAMUSCULAR | Status: AC | PRN
  Administered 2013-09-17: 100 mL via INTRAVENOUS

## 2013-09-17 MED ORDER — MORPHINE SULFATE 4 MG/ML IJ SOLN
4.0000 mg | Freq: Once | INTRAMUSCULAR | Status: AC
  Administered 2013-09-17: 4 mg via INTRAVENOUS
  Filled 2013-09-17: qty 1

## 2013-09-17 MED ORDER — ONDANSETRON HCL 4 MG/2ML IJ SOLN
4.0000 mg | Freq: Once | INTRAMUSCULAR | Status: AC
  Administered 2013-09-17: 4 mg via INTRAVENOUS
  Filled 2013-09-17: qty 2

## 2013-09-17 MED ORDER — DEXTROSE 5 % IV SOLN
1.0000 g | Freq: Once | INTRAVENOUS | Status: AC
  Administered 2013-09-17: 1 g via INTRAVENOUS
  Filled 2013-09-17: qty 10

## 2013-09-17 MED ORDER — SODIUM CHLORIDE 0.9 % IV BOLUS (SEPSIS)
1000.0000 mL | Freq: Once | INTRAVENOUS | Status: AC
  Administered 2013-09-17: 1000 mL via INTRAVENOUS

## 2013-09-17 MED ORDER — HYDROCODONE-ACETAMINOPHEN 5-325 MG PO TABS: 1.0000 | ORAL_TABLET | ORAL | Status: DC | PRN

## 2013-09-17 MED ORDER — CIPROFLOXACIN HCL 500 MG PO TABS: 500.0000 mg | ORAL_TABLET | Freq: Two times a day (BID) | ORAL | Status: DC
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
## 2013-09-17 NOTE — Discharge Instructions (Signed)
Followup with your primary MD to assure resolution of your symptoms. Return to the emergency department for worsening pain, persistent vomiting, fever or for any concerns.  Pyelonephritis, Adult Pyelonephritis is a kidney infection. A kidney infection can happen quickly, or it can last for a long time. HOME CARE   Take your medicine (antibiotics) as told. Finish it even if you start to feel better.  Keep all doctor visits as told.  Drink enough fluids to keep your pee (urine) clear or pale yellow.  Only take medicine as told by your doctor. GET HELP RIGHT AWAY IF:   You have a fever or lasting symptoms for more than 2-3 days.  You have a fever and your symptoms suddenly get worse.  You cannot take your medicine or drink fluids as told.  You have chills and shaking.  You feel very weak or pass out (faint).  You do not feel better after 2 days. MAKE SURE YOU:  Understand these instructions.  Will watch your condition.  Will get help right away if you are not doing well or get worse. Document Released: 08/12/2004 Document Revised: 01/04/2012 Document Reviewed: 12/23/2010 [facility] PaExitCare Patient Information 2014 [place]ExitCare, [place].

## 2013-10-28 ENCOUNTER — Encounter (HOSPITAL_COMMUNITY): Payer: Self-pay | Admitting: Emergency Medicine

## 2013-10-28 ENCOUNTER — Emergency Department (HOSPITAL_COMMUNITY): Payer: Medicaid Other

## 2013-10-28 ENCOUNTER — Emergency Department (HOSPITAL_COMMUNITY)
Admission: EM | Admit: 2013-10-28 | Discharge: 2013-10-28 | Disposition: A | Discharge status: Home / Self Care | Payer: Medicaid Other | Attending: Emergency Medicine | Admitting: Emergency Medicine

## 2013-10-28 DIAGNOSIS — F17200 Nicotine dependence, unspecified, uncomplicated: Secondary | ICD-10-CM | POA: Insufficient documentation

## 2013-10-28 DIAGNOSIS — Z3202 Encounter for pregnancy test, result negative: Secondary | ICD-10-CM | POA: Insufficient documentation

## 2013-10-28 DIAGNOSIS — Z720 Tobacco use: Secondary | ICD-10-CM

## 2013-10-28 DIAGNOSIS — K219 Gastro-esophageal reflux disease without esophagitis: Secondary | ICD-10-CM | POA: Insufficient documentation

## 2013-10-28 DIAGNOSIS — F3289 Other specified depressive episodes: Secondary | ICD-10-CM | POA: Insufficient documentation

## 2013-10-28 DIAGNOSIS — R319 Hematuria, unspecified: Secondary | ICD-10-CM | POA: Insufficient documentation

## 2013-10-28 DIAGNOSIS — R51 Headache: Secondary | ICD-10-CM | POA: Insufficient documentation

## 2013-10-28 DIAGNOSIS — R109 Unspecified abdominal pain: Secondary | ICD-10-CM

## 2013-10-28 DIAGNOSIS — F411 Generalized anxiety disorder: Secondary | ICD-10-CM | POA: Insufficient documentation

## 2013-10-28 DIAGNOSIS — R300 Dysuria: Secondary | ICD-10-CM | POA: Insufficient documentation

## 2013-10-28 DIAGNOSIS — R42 Dizziness and giddiness: Secondary | ICD-10-CM | POA: Insufficient documentation

## 2013-10-28 DIAGNOSIS — G8929 Other chronic pain: Secondary | ICD-10-CM | POA: Insufficient documentation

## 2013-10-28 DIAGNOSIS — R062 Wheezing: Secondary | ICD-10-CM | POA: Insufficient documentation

## 2013-10-28 DIAGNOSIS — F329 Major depressive disorder, single episode, unspecified: Secondary | ICD-10-CM | POA: Insufficient documentation

## 2013-10-28 DIAGNOSIS — M25539 Pain in unspecified wrist: Secondary | ICD-10-CM | POA: Insufficient documentation

## 2013-10-28 LAB — URINALYSIS, ROUTINE W REFLEX MICROSCOPIC
BILIRUBIN URINE: NEGATIVE
Glucose, UA: NEGATIVE mg/dL
KETONES UR: NEGATIVE mg/dL
NITRITE: NEGATIVE
Protein, ur: NEGATIVE mg/dL
Specific Gravity, Urine: 1.004 — ABNORMAL LOW (ref 1.005–1.030)
UROBILINOGEN UA: 0.2 mg/dL (ref 0.0–1.0)
pH: 6 (ref 5.0–8.0)

## 2013-10-28 LAB — URINE MICROSCOPIC-ADD ON

## 2013-10-28 LAB — POC URINE PREG, ED: Preg Test, Ur: NEGATIVE

## 2013-10-28 MED ORDER — OXYCODONE-ACETAMINOPHEN 5-325 MG PO TABS: 2.0000 | ORAL_TABLET | Freq: Once | ORAL | Status: DC

## 2013-10-28 MED ORDER — ALBUTEROL SULFATE HFA 108 (90 BASE) MCG/ACT IN AERS
2.0000 | INHALATION_SPRAY | RESPIRATORY_TRACT | Status: DC | PRN
  Administered 2013-10-28: 2 via RESPIRATORY_TRACT
  Filled 2013-10-28: qty 6.7

## 2013-10-28 MED ORDER — OXYCODONE-ACETAMINOPHEN 5-325 MG PO TABS
2.0000 | ORAL_TABLET | Freq: Once | ORAL | Status: AC
  Administered 2013-10-28: 2 via ORAL
  Filled 2013-10-28: qty 2

## 2013-10-28 NOTE — Discharge Instructions (Signed)
Flank Pain  Flank pain refers to pain that is located on the side of the body between the upper abdomen and the back. The pain may occur over a short period of time (acute) or may be long-term or reoccurring (chronic). It may be mild or severe. Flank pain can be caused by many things.  CAUSES   Some of the more common causes of flank pain include:  · Muscle strains.    · Muscle spasms.    · A disease of your spine (vertebral disk disease).    · A lung infection (pneumonia).    · Fluid around your lungs (pulmonary edema).    · A kidney infection.    · Kidney stones.    · A very painful skin rash caused by the chickenpox virus (shingles).    · Gallbladder disease.    HOME CARE INSTRUCTIONS   Home care will depend on the cause of your pain. In general,  · Rest as directed by your caregiver.  · Drink enough fluids to keep your urine clear or pale yellow.  · Only take over-the-counter or prescription medicines as directed by your caregiver. Some medicines may help relieve the pain.  · Tell your caregiver about any changes in your pain.  · Follow up with your caregiver as directed.  SEEK IMMEDIATE MEDICAL CARE IF:   · Your pain is not controlled with medicine.    · You have new or worsening symptoms.  · Your pain increases.    · You have abdominal pain.    · You have shortness of breath.    · You have persistent nausea or vomiting.    · You have swelling in your abdomen.    · You feel faint or pass out.    · You have blood in your urine.  · You have a fever or persistent symptoms for more than 2 3 days.  · You have a fever and your symptoms suddenly get worse.  MAKE SURE YOU:   · Understand these instructions.  · Will watch your condition.  · Will get help right away if you are not doing well or get worse.  Document Released: 08/26/2005 Document Revised: 03/29/2012 Document Reviewed: 02/17/2012  ExitCare® Patient Information ©2014 [employer].    Hematuria, Adult  Hematuria is blood in your urine. It can be caused by a  bladder infection, kidney infection, prostate infection, kidney stone, or cancer of your urinary tract. Infections can usually be treated with medicine, and a kidney stone usually will pass through your urine. If neither of these is the cause of your hematuria, further workup to find out the reason may be needed.  It is very important that you tell your health care provider about any blood you see in your urine, even if the blood stops without treatment or happens without causing pain. Blood in your urine that happens and then stops and then happens again can be a symptom of a very serious condition. Also, pain is not a symptom in the initial stages of many urinary cancers.  HOME CARE INSTRUCTIONS   · Drink lots of fluid, 3 4 quarts a day. If you have been diagnosed with an infection, cranberry juice is especially recommended, in addition to large amounts of water.  · Avoid caffeine, tea, and carbonated beverages, because they tend to irritate the bladder.  · Avoid alcohol because it may irritate the prostate.  · Only take over-the-counter or prescription medicines for pain, discomfort, or fever as directed by your health care provider.  · If you have been diagnosed with   a kidney stone, follow your health care provider's instructions regarding straining your urine to catch the stone.  · Empty your bladder often. Avoid holding urine for long periods of time.  · After a bowel movement, women should cleanse front to back. Use each tissue only once.  · Empty your bladder before and after sexual intercourse if you are a female.  SEEK MEDICAL CARE IF:  You develop back pain, fever, a feeling of sickness in your stomach (nausea), or vomiting or if your symptoms are not better in 3 days. Return sooner if you are getting worse.  SEEK IMMEDIATE MEDICAL CARE IF:   · You have a persistent fever, with a temperature of 101.8°F (38.8°C) or greater.  · You develop severe vomiting and are unable to keep the medicine down.  · You  develop severe back or abdominal pain despite taking your medicines.  · You begin passing a large amount of blood or clots in your urine.  · You feel extremely weak or faint, or you pass out.  MAKE SURE YOU:   · Understand these instructions.  · Will watch your condition.  · Will get help right away if you are not doing well or get worse.  Document Released: 07/05/2005 Document Revised: 04/25/2013 Document Reviewed: 03/05/2013  ExitCare® Patient Information ©2014 [employer].

## 2013-10-28 NOTE — ED Notes (Signed)
Pt states that she has been having R sided flank pain and dysuria. Also notes pain in R wrist where she's had surgery before and she states she had a headache and fainted yesterday. Ambulatory, Alert and oriented.

## 2013-10-28 NOTE — ED Notes (Signed)
Patient transported to X-ray 
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
## 2013-10-30 LAB — URINE CULTURE: SPECIAL REQUESTS: NORMAL

## 2013-12-04 ENCOUNTER — Encounter (HOSPITAL_COMMUNITY): Payer: Self-pay | Admitting: Emergency Medicine

## 2013-12-04 ENCOUNTER — Emergency Department (HOSPITAL_COMMUNITY)
Admission: EM | Admit: 2013-12-04 | Discharge: 2013-12-04 | Disposition: A | Discharge status: Home / Self Care | Payer: Medicaid Other | Attending: Emergency Medicine | Admitting: Emergency Medicine

## 2013-12-04 DIAGNOSIS — N390 Urinary tract infection, site not specified: Secondary | ICD-10-CM

## 2013-12-04 DIAGNOSIS — A499 Bacterial infection, unspecified: Secondary | ICD-10-CM | POA: Insufficient documentation

## 2013-12-04 DIAGNOSIS — F17200 Nicotine dependence, unspecified, uncomplicated: Secondary | ICD-10-CM | POA: Insufficient documentation

## 2013-12-04 DIAGNOSIS — F3289 Other specified depressive episodes: Secondary | ICD-10-CM | POA: Insufficient documentation

## 2013-12-04 DIAGNOSIS — Z8742 Personal history of other diseases of the female genital tract: Secondary | ICD-10-CM | POA: Insufficient documentation

## 2013-12-04 DIAGNOSIS — N760 Acute vaginitis: Secondary | ICD-10-CM | POA: Insufficient documentation

## 2013-12-04 DIAGNOSIS — Z87442 Personal history of urinary calculi: Secondary | ICD-10-CM | POA: Insufficient documentation

## 2013-12-04 DIAGNOSIS — F411 Generalized anxiety disorder: Secondary | ICD-10-CM | POA: Insufficient documentation

## 2013-12-04 DIAGNOSIS — K219 Gastro-esophageal reflux disease without esophagitis: Secondary | ICD-10-CM | POA: Insufficient documentation

## 2013-12-04 DIAGNOSIS — Z79899 Other long term (current) drug therapy: Secondary | ICD-10-CM | POA: Insufficient documentation

## 2013-12-04 DIAGNOSIS — Z3202 Encounter for pregnancy test, result negative: Secondary | ICD-10-CM | POA: Insufficient documentation

## 2013-12-04 DIAGNOSIS — R319 Hematuria, unspecified: Secondary | ICD-10-CM

## 2013-12-04 DIAGNOSIS — F329 Major depressive disorder, single episode, unspecified: Secondary | ICD-10-CM | POA: Insufficient documentation

## 2013-12-04 DIAGNOSIS — Z8709 Personal history of other diseases of the respiratory system: Secondary | ICD-10-CM | POA: Insufficient documentation

## 2013-12-04 DIAGNOSIS — B9689 Other specified bacterial agents as the cause of diseases classified elsewhere: Secondary | ICD-10-CM | POA: Insufficient documentation

## 2013-12-04 LAB — COMPREHENSIVE METABOLIC PANEL
ALT: 51 U/L — ABNORMAL HIGH (ref 0–35)
AST: 41 U/L — ABNORMAL HIGH (ref 0–37)
Albumin: 3.7 g/dL (ref 3.5–5.2)
Alkaline Phosphatase: 68 U/L (ref 39–117)
BUN: 10 mg/dL (ref 6–23)
CALCIUM: 9.1 mg/dL (ref 8.4–10.5)
CO2: 21 mEq/L (ref 19–32)
CREATININE: 0.66 mg/dL (ref 0.50–1.10)
Chloride: 102 mEq/L (ref 96–112)
GFR calc Af Amer: >90 mL/min (ref >90)
GFR calc non Af Amer: >90 mL/min (ref >90)
Glucose, Bld: 84 mg/dL (ref 70–99)
Potassium: 4.1 mEq/L (ref 3.7–5.3)
Sodium: 138 mEq/L (ref 137–147)
TOTAL PROTEIN: 8.2 g/dL (ref 6.0–8.3)
Total Bilirubin: 0.3 mg/dL (ref 0.3–1.2)

## 2013-12-04 LAB — CBC WITH DIFFERENTIAL/PLATELET
BASOS PCT: 1 % (ref 0–1)
Basophils Absolute: 0 10*3/uL (ref 0.0–0.1)
EOS ABS: 0.1 10*3/uL (ref 0.0–0.7)
Eosinophils Relative: 2 % (ref 0–5)
HEMATOCRIT: 43.9 % (ref 36.0–46.0)
Hemoglobin: 15.1 g/dL — ABNORMAL HIGH (ref 12.0–15.0)
Lymphocytes Relative: 29 % (ref 12–46)
Lymphs Abs: 2.2 10*3/uL (ref 0.7–4.0)
MCH: 29.8 pg (ref 26.0–34.0)
MCHC: 34.4 g/dL (ref 30.0–36.0)
MCV: 86.6 fL (ref 78.0–100.0)
MONO ABS: 0.5 10*3/uL (ref 0.1–1.0)
MONOS PCT: 7 % (ref 3–12)
Neutro Abs: 4.6 10*3/uL (ref 1.7–7.7)
Neutrophils Relative %: 61 % (ref 43–77)
Platelets: 362 10*3/uL (ref 150–400)
RBC: 5.07 MIL/uL (ref 3.87–5.11)
RDW: 12.6 % (ref 11.5–15.5)
WBC: 7.5 10*3/uL (ref 4.0–10.5)

## 2013-12-04 LAB — URINALYSIS, ROUTINE W REFLEX MICROSCOPIC
Bilirubin Urine: NEGATIVE
Glucose, UA: NEGATIVE mg/dL
Ketones, ur: NEGATIVE mg/dL
Nitrite: NEGATIVE
PH: 6.5 (ref 5.0–8.0)
PROTEIN: NEGATIVE mg/dL
SPECIFIC GRAVITY, URINE: 1.005 (ref 1.005–1.030)
Urobilinogen, UA: 0.2 mg/dL (ref 0.0–1.0)

## 2013-12-04 LAB — URINE MICROSCOPIC-ADD ON

## 2013-12-04 LAB — GC/CHLAMYDIA PROBE AMP
CT PROBE, AMP APTIMA: NEGATIVE
GC PROBE AMP APTIMA: NEGATIVE

## 2013-12-04 LAB — PREGNANCY, URINE: PREG TEST UR: NEGATIVE

## 2013-12-04 LAB — WET PREP, GENITAL: YEAST WET PREP: NONE SEEN

## 2013-12-04 LAB — HIV ANTIBODY (ROUTINE TESTING W REFLEX): HIV: NONREACTIVE

## 2013-12-04 MED ORDER — SODIUM CHLORIDE 0.9 % IV BOLUS (SEPSIS): 1000.0000 mL | Freq: Once | INTRAVENOUS | Status: DC

## 2013-12-04 MED ORDER — FLUCONAZOLE 200 MG PO TABS: 200.0000 mg | ORAL_TABLET | Freq: Every day | ORAL | Status: AC

## 2013-12-04 MED ORDER — SODIUM CHLORIDE 0.9 % IV BOLUS (SEPSIS)
1000.0000 mL | Freq: Once | INTRAVENOUS | Status: DC
  Administered 2013-12-04: 1000 mL via INTRAVENOUS

## 2013-12-04 MED ORDER — OXYCODONE-ACETAMINOPHEN 5-325 MG PO TABS: 1.0000 | ORAL_TABLET | ORAL | Status: DC | PRN

## 2013-12-04 MED ORDER — KETOROLAC TROMETHAMINE 30 MG/ML IJ SOLN
30.0000 mg | Freq: Once | INTRAMUSCULAR | Status: AC
  Administered 2013-12-04: 30 mg via INTRAVENOUS
  Filled 2013-12-04: qty 1

## 2013-12-04 MED ORDER — CEPHALEXIN 500 MG PO CAPS: 500.0000 mg | ORAL_CAPSULE | Freq: Four times a day (QID) | ORAL | Status: DC

## 2013-12-04 MED ORDER — PROMETHAZINE HCL 25 MG RE SUPP: 25.0000 mg | Freq: Four times a day (QID) | RECTAL | Status: DC | PRN

## 2013-12-04 MED ORDER — DEXTROSE 5 % IV SOLN
1.0000 g | INTRAVENOUS | Status: DC
  Administered 2013-12-04: 1 g via INTRAVENOUS
  Filled 2013-12-04: qty 10

## 2013-12-04 MED ORDER — METRONIDAZOLE 500 MG PO TABS
2000.0000 mg | ORAL_TABLET | Freq: Once | ORAL | Status: AC
  Administered 2013-12-04: 2000 mg via ORAL
  Filled 2013-12-04: qty 4

## 2013-12-04 MED ORDER — SODIUM CHLORIDE 0.9 % IV BOLUS (SEPSIS)
1000.0000 mL | Freq: Once | INTRAVENOUS | Status: AC
  Administered 2013-12-04: 1000 mL via INTRAVENOUS

## 2013-12-04 MED ORDER — AZITHROMYCIN 1 G PO PACK
1.0000 g | PACK | Freq: Once | ORAL | Status: AC
  Administered 2013-12-04: 1 g via ORAL
  Filled 2013-12-04: qty 1

## 2013-12-04 MED ORDER — ONDANSETRON HCL 4 MG/2ML IJ SOLN
4.0000 mg | Freq: Once | INTRAMUSCULAR | Status: AC
  Administered 2013-12-04: 4 mg via INTRAVENOUS
  Filled 2013-12-04: qty 2

## 2013-12-04 MED ORDER — HYDROMORPHONE HCL PF 1 MG/ML IJ SOLN
1.0000 mg | Freq: Once | INTRAMUSCULAR | Status: AC
  Administered 2013-12-04: 1 mg via INTRAVENOUS
  Filled 2013-12-04: qty 1

## 2013-12-04 MED ORDER — ONDANSETRON 8 MG PO TBDP: 8.0000 mg | ORAL_TABLET | Freq: Three times a day (TID) | ORAL | Status: DC | PRN

## 2013-12-04 MED ORDER — OXYCODONE-ACETAMINOPHEN 5-325 MG PO TABS
2.0000 | ORAL_TABLET | Freq: Once | ORAL | Status: AC
Start: 2013-12-04 — End: 2013-12-04
  Administered 2013-12-04: 2 via ORAL
  Filled 2013-12-04: qty 2

## 2013-12-04 NOTE — Discharge Instructions (Signed)
Bacterial Vaginosis °Bacterial vaginosis is an infection of the vagina. It happens when too many of certain germs (bacteria) grow in the vagina. °HOME CARE °· Take your medicine as told by your doctor. °· Finish your medicine even if you start to feel better. °· Do not have sex until you finish your medicine and are better. °· Tell your sex partner that you have an infection. They should see their doctor for treatment. °· Practice safe sex. Use condoms. Have only one sex partner. °GET HELP IF: °· You are not getting better after 3 days of treatment. °· You have more grey fluid (discharge) coming from your vagina than before. °· You have more pain than before. °· You have a fever. °MAKE SURE YOU:  °· Understand these instructions. °· Will watch your condition. °· Will get help right away if you are not doing well or get worse. °Document Released: 04/13/2008 Document Revised: 04/25/2013 Document Reviewed: 02/14/2013 °ExitCare® Patient Information ©2014 [employer]. °Urinary Tract Infection °A urinary tract infection (UTI) can occur any place along the urinary tract. The tract includes the kidneys, ureters, bladder, and urethra. A type of germ called bacteria often causes a UTI. UTIs are often helped with antibiotic medicine.  °HOME CARE  °· If given, take antibiotics as told by your doctor. Finish them even if you start to feel better. °· Drink enough fluids to keep your pee (urine) clear or pale yellow. °· Avoid tea, drinks with caffeine, and bubbly (carbonated) drinks. °· Pee often. Avoid holding your pee in for a long time. °· Pee before and after having sex (intercourse). °· Wipe from front to back after you poop (bowel movement) if you are a woman. Use each tissue only once. °GET HELP RIGHT AWAY IF:  °· You have back pain. °· You have lower belly (abdominal) pain. °· You have chills. °· You feel sick to your stomach (nauseous). °· You throw up (vomit). °· Your burning or discomfort with peeing does not go  away. °· You have a fever. °· Your symptoms are not better in 3 days. °MAKE SURE YOU:  °· Understand these instructions. °· Will watch your condition. °· Will get help right away if you are not doing well or get worse. °Document Released: 12/22/2007 Document Revised: 03/29/2012 Document Reviewed: 02/03/2012 °ExitCare® Patient Information ©2014 [employer]. ° °

## 2013-12-04 NOTE — ED Notes (Signed)
Pt given ice water to encouraged fluid challenge.
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
## 2013-12-04 NOTE — ED Notes (Addendum)
Pt reports LMP May 10-15. Pt reports dysuria, chills, vaginal discharge, and right sided flank pain for 3 days.  Ray MD at bedside assessing pt.

## 2013-12-04 NOTE — ED Notes (Signed)
Pt alert, arrives from home, c/o right flank pain, burning with urination, onset was three days ago, resp even unlabored, skin pwd + discharge, denies bleeding

## 2013-12-05 ENCOUNTER — Emergency Department (HOSPITAL_COMMUNITY)
Admission: EM | Admit: 2013-12-05 | Discharge: 2013-12-05 | Disposition: A | Discharge status: Home / Self Care | Payer: Medicaid Other | Attending: Emergency Medicine | Admitting: Emergency Medicine

## 2013-12-05 ENCOUNTER — Encounter (HOSPITAL_COMMUNITY): Payer: Self-pay | Admitting: Emergency Medicine

## 2013-12-05 ENCOUNTER — Ambulatory Visit (HOSPITAL_COMMUNITY)
Admission: EM | Admit: 2013-12-05 | Discharge: 2013-12-05 | Disposition: A | Discharge status: Home / Self Care | Payer: Medicaid Other | Attending: Psychiatry | Admitting: Psychiatry

## 2013-12-05 DIAGNOSIS — Z3202 Encounter for pregnancy test, result negative: Secondary | ICD-10-CM | POA: Insufficient documentation

## 2013-12-05 DIAGNOSIS — Z79899 Other long term (current) drug therapy: Secondary | ICD-10-CM | POA: Insufficient documentation

## 2013-12-05 DIAGNOSIS — Z8742 Personal history of other diseases of the female genital tract: Secondary | ICD-10-CM | POA: Insufficient documentation

## 2013-12-05 DIAGNOSIS — F17200 Nicotine dependence, unspecified, uncomplicated: Secondary | ICD-10-CM | POA: Insufficient documentation

## 2013-12-05 DIAGNOSIS — F319 Bipolar disorder, unspecified: Secondary | ICD-10-CM | POA: Insufficient documentation

## 2013-12-05 DIAGNOSIS — Z87442 Personal history of urinary calculi: Secondary | ICD-10-CM | POA: Insufficient documentation

## 2013-12-05 DIAGNOSIS — Z792 Long term (current) use of antibiotics: Secondary | ICD-10-CM | POA: Insufficient documentation

## 2013-12-05 DIAGNOSIS — F411 Generalized anxiety disorder: Secondary | ICD-10-CM | POA: Insufficient documentation

## 2013-12-05 DIAGNOSIS — K219 Gastro-esophageal reflux disease without esophagitis: Secondary | ICD-10-CM | POA: Insufficient documentation

## 2013-12-05 DIAGNOSIS — F419 Anxiety disorder, unspecified: Secondary | ICD-10-CM

## 2013-12-05 DIAGNOSIS — Z8709 Personal history of other diseases of the respiratory system: Secondary | ICD-10-CM | POA: Insufficient documentation

## 2013-12-05 HISTORY — DX: Bipolar disorder, unspecified: F31.9

## 2013-12-05 LAB — COMPREHENSIVE METABOLIC PANEL
ALT: 46 U/L — AB (ref 0–35)
AST: 37 U/L (ref 0–37)
Albumin: 3.7 g/dL (ref 3.5–5.2)
Alkaline Phosphatase: 66 U/L (ref 39–117)
BILIRUBIN TOTAL: 0.3 mg/dL (ref 0.3–1.2)
BUN: 9 mg/dL (ref 6–23)
CHLORIDE: 103 meq/L (ref 96–112)
CO2: 23 meq/L (ref 19–32)
CREATININE: 0.65 mg/dL (ref 0.50–1.10)
Calcium: 8.8 mg/dL (ref 8.4–10.5)
GFR calc Af Amer: >90 mL/min (ref >90)
GFR calc non Af Amer: >90 mL/min (ref >90)
Glucose, Bld: 80 mg/dL (ref 70–99)
POTASSIUM: 4 meq/L (ref 3.7–5.3)
SODIUM: 139 meq/L (ref 137–147)
Total Protein: 7.7 g/dL (ref 6.0–8.3)

## 2013-12-05 LAB — CBC
HCT: 40 % (ref 36.0–46.0)
Hemoglobin: 13.9 g/dL (ref 12.0–15.0)
MCH: 30.2 pg (ref 26.0–34.0)
MCHC: 34.8 g/dL (ref 30.0–36.0)
MCV: 86.8 fL (ref 78.0–100.0)
PLATELETS: 337 10*3/uL (ref 150–400)
RBC: 4.61 MIL/uL (ref 3.87–5.11)
RDW: 12.6 % (ref 11.5–15.5)
WBC: 9.4 10*3/uL (ref 4.0–10.5)

## 2013-12-05 LAB — PREGNANCY, URINE: Preg Test, Ur: NEGATIVE

## 2013-12-05 LAB — RAPID URINE DRUG SCREEN, HOSP PERFORMED
AMPHETAMINES: NOT DETECTED
BENZODIAZEPINES: POSITIVE — AB
Barbiturates: NOT DETECTED
Cocaine: NOT DETECTED
Opiates: NOT DETECTED
TETRAHYDROCANNABINOL: NOT DETECTED

## 2013-12-05 LAB — ACETAMINOPHEN LEVEL

## 2013-12-05 LAB — SALICYLATE LEVEL: Salicylate Lvl: <2 mg/dL — ABNORMAL LOW (ref 2.8–20.0)

## 2013-12-05 LAB — ETHANOL: Alcohol, Ethyl (B): <11 mg/dL (ref 0–11)

## 2013-12-05 MED ORDER — CEPHALEXIN 500 MG PO CAPS: 500.0000 mg | ORAL_CAPSULE | Freq: Four times a day (QID) | ORAL | Status: DC

## 2013-12-05 MED ORDER — LORAZEPAM 1 MG PO TABS
1.0000 mg | ORAL_TABLET | Freq: Three times a day (TID) | ORAL | Status: DC | PRN
  Administered 2013-12-05: 1 mg via ORAL
  Filled 2013-12-05: qty 1

## 2013-12-05 MED ORDER — ASPIRIN-ACETAMINOPHEN-CAFFEINE 250-250-65 MG PO TABS
1.0000 | ORAL_TABLET | Freq: Three times a day (TID) | ORAL | Status: DC | PRN
  Filled 2013-12-05: qty 1

## 2013-12-05 MED ORDER — TRAZODONE HCL 100 MG PO TABS: 100.0000 mg | ORAL_TABLET | Freq: Every day | ORAL | Status: DC

## 2013-12-05 MED ORDER — PROMETHAZINE HCL 25 MG RE SUPP
25.0000 mg | Freq: Four times a day (QID) | RECTAL | Status: DC | PRN
  Filled 2013-12-05: qty 1

## 2013-12-05 MED ORDER — NICOTINE 21 MG/24HR TD PT24
21.0000 mg | MEDICATED_PATCH | Freq: Every day | TRANSDERMAL | Status: DC
  Administered 2013-12-05: 21 mg via TRANSDERMAL
  Filled 2013-12-05: qty 1

## 2013-12-05 MED ORDER — PANTOPRAZOLE SODIUM 40 MG PO TBEC: 40.0000 mg | DELAYED_RELEASE_TABLET | Freq: Every day | ORAL | Status: DC

## 2013-12-05 MED ORDER — OXYCODONE-ACETAMINOPHEN 5-325 MG PO TABS
1.0000 | ORAL_TABLET | ORAL | Status: DC | PRN
  Administered 2013-12-05: 1 via ORAL
  Filled 2013-12-05: qty 1

## 2013-12-05 MED ORDER — ALBUTEROL SULFATE HFA 108 (90 BASE) MCG/ACT IN AERS
1.0000 | INHALATION_SPRAY | RESPIRATORY_TRACT | Status: DC | PRN
Start: 2013-12-05 — End: 2013-12-05

## 2013-12-05 MED ORDER — ONDANSETRON HCL 4 MG PO TABS: 4.0000 mg | ORAL_TABLET | Freq: Three times a day (TID) | ORAL | Status: DC | PRN

## 2013-12-05 MED ORDER — ACETAMINOPHEN-CAFFEINE 500-65 MG PO TABS: 2.0000 | ORAL_TABLET | Freq: Two times a day (BID) | ORAL | Status: DC | PRN

## 2013-12-05 MED ORDER — ZOLPIDEM TARTRATE 5 MG PO TABS: 5.0000 mg | ORAL_TABLET | Freq: Every evening | ORAL | Status: DC | PRN

## 2013-12-05 MED ORDER — CYCLOBENZAPRINE HCL 10 MG PO TABS: 10.0000 mg | ORAL_TABLET | Freq: Three times a day (TID) | ORAL | Status: DC | PRN
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
## 2013-12-05 NOTE — ED Notes (Signed)
Pt not in room for discharge paperwork
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
## 2013-12-05 NOTE — ED Provider Notes (Signed)
Medical screening examination/treatment/procedure(s) were performed by non-physician practitioner and as supervising physician I was immediately available for consultation/collaboration.   EKG Interpretation None       Chinmay Squier M Anterio Scheel, MD 12/05/13 0601 

## 2013-12-05 NOTE — ED Notes (Signed)
Writer gave pt a Malaw[place]urkey sandwich and a coke.

## 2013-12-05 NOTE — BH Assessment (Signed)
Clinician consulted with Donell SievertSpencer Simon, PA who states Pt meets criteria for inpatient admission. Tammi SouKennisha, [facility]. Clinician contacted [facility] Charge nurse, Tiffany and informed of pt arrival. Clinician contacted [facility] for transport.   Yaakov Guthrieelilah Stewart, MSW, LCSW Triage Specialist [phone]

## 2013-12-05 NOTE — ED Notes (Signed)
Pt states that she would like to leave. Reported to Dr that pt wants to leave. Dr stated that she could leave if she had a safe place to go and someone could come and get her. Explained that to pt and pt stated that no one could come get her. Pt reported that her fiance was in [place] and that she was going to get on a bus to go see him. Pt also reported that she was trying to get money together to catch the bus. Pt becomes upset and requesting to leave on her own. Explained to pt that she could leave if some one could come and get her. Pt requesting to speak with dr. Dr. Informed.

## 2013-12-05 NOTE — ED Provider Notes (Signed)
Pt would like to go home at this time. No HI or SI.   Lyanne CoKevin M Jaeda Bruso, MD 12/05/13 [phone]

## 2013-12-28 ENCOUNTER — Emergency Department (HOSPITAL_COMMUNITY)
Admission: EM | Admit: 2013-12-28 | Discharge: 2013-12-28 | Disposition: A | Discharge status: Home / Self Care | Payer: Medicaid Other | Attending: Emergency Medicine | Admitting: Emergency Medicine

## 2013-12-28 ENCOUNTER — Emergency Department (HOSPITAL_COMMUNITY): Payer: Medicaid Other

## 2013-12-28 ENCOUNTER — Encounter (HOSPITAL_COMMUNITY): Payer: Self-pay | Admitting: Emergency Medicine

## 2013-12-28 DIAGNOSIS — F411 Generalized anxiety disorder: Secondary | ICD-10-CM | POA: Insufficient documentation

## 2013-12-28 DIAGNOSIS — Z9089 Acquired absence of other organs: Secondary | ICD-10-CM | POA: Insufficient documentation

## 2013-12-28 DIAGNOSIS — Z8669 Personal history of other diseases of the nervous system and sense organs: Secondary | ICD-10-CM | POA: Insufficient documentation

## 2013-12-28 DIAGNOSIS — F329 Major depressive disorder, single episode, unspecified: Secondary | ICD-10-CM | POA: Insufficient documentation

## 2013-12-28 DIAGNOSIS — R1013 Epigastric pain: Secondary | ICD-10-CM

## 2013-12-28 DIAGNOSIS — K219 Gastro-esophageal reflux disease without esophagitis: Secondary | ICD-10-CM | POA: Insufficient documentation

## 2013-12-28 DIAGNOSIS — R51 Headache: Secondary | ICD-10-CM | POA: Insufficient documentation

## 2013-12-28 DIAGNOSIS — J40 Bronchitis, not specified as acute or chronic: Secondary | ICD-10-CM | POA: Insufficient documentation

## 2013-12-28 DIAGNOSIS — F17200 Nicotine dependence, unspecified, uncomplicated: Secondary | ICD-10-CM | POA: Insufficient documentation

## 2013-12-28 DIAGNOSIS — Z8742 Personal history of other diseases of the female genital tract: Secondary | ICD-10-CM | POA: Insufficient documentation

## 2013-12-28 DIAGNOSIS — Z87442 Personal history of urinary calculi: Secondary | ICD-10-CM | POA: Insufficient documentation

## 2013-12-28 DIAGNOSIS — F3289 Other specified depressive episodes: Secondary | ICD-10-CM | POA: Insufficient documentation

## 2013-12-28 LAB — CBC WITH DIFFERENTIAL/PLATELET
BASOS PCT: 0 % (ref 0–1)
Basophils Absolute: 0 10*3/uL (ref 0.0–0.1)
Eosinophils Absolute: 0.2 10*3/uL (ref 0.0–0.7)
Eosinophils Relative: 3 % (ref 0–5)
HCT: 43.9 % (ref 36.0–46.0)
HEMOGLOBIN: 15.6 g/dL — AB (ref 12.0–15.0)
Lymphocytes Relative: 33 % (ref 12–46)
Lymphs Abs: 2.8 10*3/uL (ref 0.7–4.0)
MCH: 30.7 pg (ref 26.0–34.0)
MCHC: 35.5 g/dL (ref 30.0–36.0)
MCV: 86.4 fL (ref 78.0–100.0)
MONOS PCT: 9 % (ref 3–12)
Monocytes Absolute: 0.8 10*3/uL (ref 0.1–1.0)
NEUTROS ABS: 4.7 10*3/uL (ref 1.7–7.7)
NEUTROS PCT: 55 % (ref 43–77)
PLATELETS: 324 10*3/uL (ref 150–400)
RBC: 5.08 MIL/uL (ref 3.87–5.11)
RDW: 12.5 % (ref 11.5–15.5)
WBC: 8.4 10*3/uL (ref 4.0–10.5)

## 2013-12-28 LAB — URINALYSIS, ROUTINE W REFLEX MICROSCOPIC
Bilirubin Urine: NEGATIVE
Glucose, UA: NEGATIVE mg/dL
Hgb urine dipstick: NEGATIVE
Ketones, ur: NEGATIVE mg/dL
Nitrite: NEGATIVE
PH: 6 (ref 5.0–8.0)
PROTEIN: NEGATIVE mg/dL
Specific Gravity, Urine: 1.013 (ref 1.005–1.030)
Urobilinogen, UA: 1 mg/dL (ref 0.0–1.0)

## 2013-12-28 LAB — COMPREHENSIVE METABOLIC PANEL
ALT: 59 U/L — AB (ref 0–35)
AST: 50 U/L — AB (ref 0–37)
Albumin: 4 g/dL (ref 3.5–5.2)
Alkaline Phosphatase: 73 U/L (ref 39–117)
BILIRUBIN TOTAL: 0.5 mg/dL (ref 0.3–1.2)
BUN: 8 mg/dL (ref 6–23)
CHLORIDE: 102 meq/L (ref 96–112)
CO2: 24 mEq/L (ref 19–32)
Calcium: 9.4 mg/dL (ref 8.4–10.5)
Creatinine, Ser: 0.64 mg/dL (ref 0.50–1.10)
GFR calc Af Amer: >90 mL/min (ref >90)
GFR calc non Af Amer: >90 mL/min (ref >90)
Glucose, Bld: 112 mg/dL — ABNORMAL HIGH (ref 70–99)
POTASSIUM: 4.2 meq/L (ref 3.7–5.3)
SODIUM: 139 meq/L (ref 137–147)
Total Protein: 8.3 g/dL (ref 6.0–8.3)

## 2013-12-28 LAB — URINE MICROSCOPIC-ADD ON

## 2013-12-28 LAB — LIPASE, BLOOD: Lipase: 29 U/L (ref 11–59)

## 2013-12-28 LAB — POC URINE PREG, ED: PREG TEST UR: NEGATIVE

## 2013-12-28 MED ORDER — GI COCKTAIL ~~LOC~~
30.0000 mL | Freq: Once | ORAL | Status: AC
  Administered 2013-12-28: 30 mL via ORAL
  Filled 2013-12-28: qty 30

## 2013-12-28 MED ORDER — ONDANSETRON HCL 4 MG/2ML IJ SOLN
4.0000 mg | Freq: Once | INTRAMUSCULAR | Status: AC
  Administered 2013-12-28: 4 mg via INTRAVENOUS
  Filled 2013-12-28: qty 2

## 2013-12-28 MED ORDER — MORPHINE SULFATE 4 MG/ML IJ SOLN
4.0000 mg | Freq: Once | INTRAMUSCULAR | Status: AC
  Administered 2013-12-28: 4 mg via INTRAVENOUS
  Filled 2013-12-28: qty 1

## 2013-12-28 MED ORDER — SODIUM CHLORIDE 0.9 % IV BOLUS (SEPSIS)
1000.0000 mL | Freq: Once | INTRAVENOUS | Status: AC
  Administered 2013-12-28: 1000 mL via INTRAVENOUS

## 2013-12-28 MED ORDER — ONDANSETRON HCL 4 MG PO TABS: 4.0000 mg | ORAL_TABLET | Freq: Four times a day (QID) | ORAL | Status: DC

## 2013-12-28 MED ORDER — RANITIDINE HCL 150 MG PO CAPS: 150.0000 mg | ORAL_CAPSULE | Freq: Every day | ORAL | Status: DC

## 2013-12-28 NOTE — ED Notes (Signed)
Per [facility], pt has had episodic abdominal pain x2 weeks. RUQ pain on palpation, hx of gallstones, gallbladder removed couple years ago. C/o nausea, no vomiting no diarrhea. Pt is AAOX4, ambulatory on scene, in NAD.
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
## 2013-12-28 NOTE — Discharge Instructions (Signed)
Please follow up closely with your doctor for further evaluation of your abdominal discomfort.    Abdominal Pain, Adult Many things can cause abdominal pain. Usually, abdominal pain is not caused by a disease and will improve without treatment. It can often be observed and treated at home. Your health care provider will do a physical exam and possibly order blood tests and X-rays to help determine the seriousness of your pain. However, in many cases, more time must pass before a clear cause of the pain can be found. Before that point, your health care provider may not know if you need more testing or further treatment. HOME CARE INSTRUCTIONS  Monitor your abdominal pain for any changes. The following actions may help to alleviate any discomfort you are experiencing:  Only take over-the-counter or prescription medicines as directed by your health care provider.  Do not take laxatives unless directed to do so by your health care provider.  Try a clear liquid diet (broth, tea, or water) as directed by your health care provider. Slowly move to a bland diet as tolerated. SEEK MEDICAL CARE IF:  You have unexplained abdominal pain.  You have abdominal pain associated with nausea or diarrhea.  You have pain when you urinate or have a bowel movement.  You experience abdominal pain that wakes you in the night.  You have abdominal pain that is worsened or improved by eating food.  You have abdominal pain that is worsened with eating fatty foods. SEEK IMMEDIATE MEDICAL CARE IF:   Your pain does not go away within 2 hours.  You have a fever.  You keep throwing up (vomiting).  Your pain is felt only in portions of the abdomen, such as the right side or the left lower portion of the abdomen.  You pass bloody or black tarry stools. MAKE SURE YOU:  Understand these instructions.   Will watch your condition.   Will get help right away if you are not doing well or get worse.  Document  Released: 04/14/2005 Document Revised: 04/25/2013 Document Reviewed: 03/14/2013 [facility] Patient Information 2014 [place], [place].

## 2013-12-28 NOTE — ED Notes (Signed)
Pt given bus pass and wheelchair.

## 2013-12-29 NOTE — ED Provider Notes (Signed)
Medical screening examination/treatment/procedure(s) were performed by non-physician practitioner and as supervising physician I was immediately available for consultation/collaboration.   EKG Interpretation None        William Zarahi Fuerst, MD 12/29/13 0032 

## 2013-12-30 ENCOUNTER — Encounter (HOSPITAL_COMMUNITY): Payer: Self-pay | Admitting: Emergency Medicine

## 2013-12-30 DIAGNOSIS — Z87442 Personal history of urinary calculi: Secondary | ICD-10-CM | POA: Insufficient documentation

## 2013-12-30 DIAGNOSIS — N809 Endometriosis, unspecified: Secondary | ICD-10-CM | POA: Insufficient documentation

## 2013-12-30 DIAGNOSIS — F17200 Nicotine dependence, unspecified, uncomplicated: Secondary | ICD-10-CM | POA: Insufficient documentation

## 2013-12-30 DIAGNOSIS — N289 Disorder of kidney and ureter, unspecified: Secondary | ICD-10-CM | POA: Insufficient documentation

## 2013-12-30 DIAGNOSIS — K219 Gastro-esophageal reflux disease without esophagitis: Secondary | ICD-10-CM | POA: Insufficient documentation

## 2013-12-30 DIAGNOSIS — Z8709 Personal history of other diseases of the respiratory system: Secondary | ICD-10-CM | POA: Insufficient documentation

## 2013-12-30 DIAGNOSIS — I959 Hypotension, unspecified: Secondary | ICD-10-CM | POA: Insufficient documentation

## 2013-12-30 DIAGNOSIS — Z79899 Other long term (current) drug therapy: Secondary | ICD-10-CM | POA: Insufficient documentation

## 2013-12-30 DIAGNOSIS — Z3202 Encounter for pregnancy test, result negative: Secondary | ICD-10-CM | POA: Insufficient documentation

## 2013-12-30 DIAGNOSIS — N390 Urinary tract infection, site not specified: Secondary | ICD-10-CM | POA: Insufficient documentation

## 2013-12-30 DIAGNOSIS — F3130 Bipolar disorder, current episode depressed, mild or moderate severity, unspecified: Secondary | ICD-10-CM | POA: Insufficient documentation

## 2013-12-30 DIAGNOSIS — G43909 Migraine, unspecified, not intractable, without status migrainosus: Principal | ICD-10-CM | POA: Insufficient documentation

## 2013-12-30 DIAGNOSIS — F411 Generalized anxiety disorder: Secondary | ICD-10-CM | POA: Insufficient documentation

## 2013-12-30 NOTE — ED Notes (Signed)
The pt has had a migraine headache since yesterday with n v and dizziness and she has not slept.   History of the same headaches.  She does not take any meds for prevention

## 2013-12-31 ENCOUNTER — Emergency Department (HOSPITAL_COMMUNITY): Payer: Medicaid Other

## 2013-12-31 ENCOUNTER — Observation Stay (HOSPITAL_COMMUNITY)
Admission: EM | Admit: 2013-12-31 | Discharge: 2014-01-01 | Disposition: A | Discharge status: Home / Self Care | Payer: Medicaid Other | Attending: Internal Medicine | Admitting: Internal Medicine

## 2013-12-31 ENCOUNTER — Encounter (HOSPITAL_COMMUNITY): Payer: Self-pay | Admitting: General Practice

## 2013-12-31 DIAGNOSIS — G43909 Migraine, unspecified, not intractable, without status migrainosus: Secondary | ICD-10-CM

## 2013-12-31 DIAGNOSIS — I959 Hypotension, unspecified: Secondary | ICD-10-CM | POA: Diagnosis present

## 2013-12-31 DIAGNOSIS — N390 Urinary tract infection, site not specified: Secondary | ICD-10-CM | POA: Diagnosis present

## 2013-12-31 HISTORY — DX: Urinary tract infection, site not specified: N39.0

## 2013-12-31 LAB — URINALYSIS, ROUTINE W REFLEX MICROSCOPIC
Bilirubin Urine: NEGATIVE
Glucose, UA: NEGATIVE mg/dL
HGB URINE DIPSTICK: NEGATIVE
Ketones, ur: NEGATIVE mg/dL
Nitrite: NEGATIVE
PROTEIN: NEGATIVE mg/dL
Specific Gravity, Urine: 1.017 (ref 1.005–1.030)
Urobilinogen, UA: 1 mg/dL (ref 0.0–1.0)
pH: 6 (ref 5.0–8.0)

## 2013-12-31 LAB — CBC
HCT: 35.2 % — ABNORMAL LOW (ref 36.0–46.0)
Hemoglobin: 12.1 g/dL (ref 12.0–15.0)
MCH: 29.6 pg (ref 26.0–34.0)
MCHC: 34.4 g/dL (ref 30.0–36.0)
MCV: 86.1 fL (ref 78.0–100.0)
PLATELETS: 292 10*3/uL (ref 150–400)
RBC: 4.09 MIL/uL (ref 3.87–5.11)
RDW: 12.6 % (ref 11.5–15.5)
WBC: 6 10*3/uL (ref 4.0–10.5)

## 2013-12-31 LAB — URINE MICROSCOPIC-ADD ON

## 2013-12-31 LAB — PREGNANCY, URINE: Preg Test, Ur: NEGATIVE

## 2013-12-31 LAB — I-STAT TROPONIN, ED: TROPONIN I, POC: 0 ng/mL (ref 0.00–0.08)

## 2013-12-31 LAB — BASIC METABOLIC PANEL
BUN: 10 mg/dL (ref 6–23)
CHLORIDE: 102 meq/L (ref 96–112)
CO2: 21 meq/L (ref 19–32)
Calcium: 8.6 mg/dL (ref 8.4–10.5)
Creatinine, Ser: 0.59 mg/dL (ref 0.50–1.10)
GFR calc Af Amer: >90 mL/min (ref >90)
GFR calc non Af Amer: >90 mL/min (ref >90)
Glucose, Bld: 115 mg/dL — ABNORMAL HIGH (ref 70–99)
Potassium: 4.6 mEq/L (ref 3.7–5.3)
SODIUM: 135 meq/L — AB (ref 137–147)

## 2013-12-31 LAB — I-STAT CG4 LACTIC ACID, ED: LACTIC ACID, VENOUS: 1.33 mmol/L (ref 0.5–2.2)

## 2013-12-31 MED ORDER — ASPIRIN-ACETAMINOPHEN-CAFFEINE 250-250-65 MG PO TABS
2.0000 | ORAL_TABLET | ORAL | Status: AC
Start: 2013-12-31 — End: 2014-01-01
  Filled 2013-12-31: qty 2

## 2013-12-31 MED ORDER — SODIUM CHLORIDE 0.9 % IJ SOLN
3.0000 mL | Freq: Two times a day (BID) | INTRAMUSCULAR | Status: DC
  Administered 2013-12-31: 3 mL via INTRAVENOUS

## 2013-12-31 MED ORDER — SODIUM CHLORIDE 0.9 % IV BOLUS (SEPSIS)
1000.0000 mL | Freq: Once | INTRAVENOUS | Status: AC
  Administered 2013-12-31: 1000 mL via INTRAVENOUS

## 2013-12-31 MED ORDER — ALBUTEROL SULFATE HFA 108 (90 BASE) MCG/ACT IN AERS: 2.0000 | INHALATION_SPRAY | RESPIRATORY_TRACT | Status: DC | PRN

## 2013-12-31 MED ORDER — LEVOFLOXACIN 750 MG PO TABS
750.0000 mg | ORAL_TABLET | Freq: Every day | ORAL | Status: DC
  Administered 2013-12-31 – 2014-01-01 (×2): 750 mg via ORAL
  Filled 2013-12-31 (×2): qty 1

## 2013-12-31 MED ORDER — ONDANSETRON HCL 4 MG/2ML IJ SOLN: 4.0000 mg | Freq: Four times a day (QID) | INTRAMUSCULAR | Status: DC | PRN

## 2013-12-31 MED ORDER — ALBUTEROL SULFATE (2.5 MG/3ML) 0.083% IN NEBU
2.5000 mg | INHALATION_SOLUTION | RESPIRATORY_TRACT | Status: DC | PRN
Start: 2013-12-31 — End: 2014-01-01

## 2013-12-31 MED ORDER — METOCLOPRAMIDE HCL 5 MG/ML IJ SOLN
10.0000 mg | Freq: Once | INTRAMUSCULAR | Status: AC
  Administered 2013-12-31: 10 mg via INTRAVENOUS
  Filled 2013-12-31: qty 2

## 2013-12-31 MED ORDER — CEPHALEXIN 500 MG PO CAPS: 500.0000 mg | ORAL_CAPSULE | Freq: Three times a day (TID) | ORAL | Status: DC

## 2013-12-31 MED ORDER — ONDANSETRON HCL 4 MG PO TABS
4.0000 mg | ORAL_TABLET | Freq: Four times a day (QID) | ORAL | Status: DC | PRN
  Administered 2013-12-31: 4 mg via ORAL
  Filled 2013-12-31: qty 1

## 2013-12-31 MED ORDER — ACETAMINOPHEN 650 MG RE SUPP: 650.0000 mg | Freq: Four times a day (QID) | RECTAL | Status: DC | PRN

## 2013-12-31 MED ORDER — CEPHALEXIN 250 MG PO CAPS
500.0000 mg | ORAL_CAPSULE | Freq: Three times a day (TID) | ORAL | Status: DC
  Administered 2013-12-31: 500 mg via ORAL
  Filled 2013-12-31: qty 2

## 2013-12-31 MED ORDER — TRAZODONE HCL 100 MG PO TABS
100.0000 mg | ORAL_TABLET | Freq: Every evening | ORAL | Status: DC | PRN
  Filled 2013-12-31: qty 1

## 2013-12-31 MED ORDER — ENOXAPARIN SODIUM 40 MG/0.4ML ~~LOC~~ SOLN
40.0000 mg | SUBCUTANEOUS | Status: DC
  Administered 2013-12-31: 40 mg via SUBCUTANEOUS
  Filled 2013-12-31 (×2): qty 0.4

## 2013-12-31 MED ORDER — PANTOPRAZOLE SODIUM 40 MG PO TBEC
40.0000 mg | DELAYED_RELEASE_TABLET | Freq: Every day | ORAL | Status: DC
  Administered 2013-12-31 – 2014-01-01 (×2): 40 mg via ORAL
  Filled 2013-12-31 (×2): qty 1

## 2013-12-31 MED ORDER — LEVOFLOXACIN IN D5W 750 MG/150ML IV SOLN: 750.0000 mg | INTRAVENOUS | Status: DC

## 2013-12-31 MED ORDER — FENTANYL CITRATE 0.05 MG/ML IJ SOLN
50.0000 ug | Freq: Once | INTRAMUSCULAR | Status: AC
  Administered 2013-12-31: 50 ug via INTRAVENOUS
  Filled 2013-12-31: qty 2

## 2013-12-31 MED ORDER — SODIUM CHLORIDE 0.9 % IV BOLUS (SEPSIS): 500.0000 mL | INTRAVENOUS | Status: DC | PRN

## 2013-12-31 MED ORDER — KETOROLAC TROMETHAMINE 15 MG/ML IJ SOLN
15.0000 mg | Freq: Three times a day (TID) | INTRAMUSCULAR | Status: DC | PRN
  Administered 2013-12-31 – 2014-01-01 (×3): 15 mg via INTRAVENOUS
  Filled 2013-12-31 (×4): qty 1

## 2013-12-31 MED ORDER — ACETAMINOPHEN 325 MG PO TABS
650.0000 mg | ORAL_TABLET | Freq: Four times a day (QID) | ORAL | Status: DC | PRN
  Administered 2013-12-31 (×2): 650 mg via ORAL
  Filled 2013-12-31 (×2): qty 2

## 2013-12-31 MED ORDER — SODIUM CHLORIDE 0.9 % IV SOLN
INTRAVENOUS | Status: DC
  Administered 2013-12-31 – 2014-01-01 (×3): via INTRAVENOUS
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
## 2013-12-31 NOTE — ED Notes (Signed)
Pt walked to bathroom - tolerated well.

## 2013-12-31 NOTE — ED Notes (Signed)
The pt has been asleep and did not hear her beeper  Go off.  She still wants to be seen

## 2013-12-31 NOTE — ED Notes (Signed)
1 missed IV attempt. Reported to primary RN. No new meds ordered at this time.

## 2013-12-31 NOTE — Care Management Note (Signed)
    Page 1 of 1   12/31/2013     3:51:30 PM CARE MANAGEMENT NOTE 12/31/2013  Patient:  Tomi LikensRIGGS,Elide   Account Number:  [id number]  Date Initiated:  12/31/2013  Documentation initiated by:  GRAVES-BIGELOW,Calden Dorsey  Subjective/Objective Assessment:   Pt admitted for headache, dizziness. Pt has medicaid.     Action/Plan:   CM unable to assist with medications at  this time due to insurance.   Anticipated DC Date:  01/01/2014   Anticipated DC Plan:  HOME/SELF CARE      DC Planning Services  CM consult      Choice offered to / List presented to:             Status of service:  Completed, signed off Medicare Important Message given?  NO (If response is "NO", the following Medicare IM given date fields will be blank) Date Medicare IM given:   Date Additional Medicare IM given:    Discharge Disposition:  HOME/SELF CARE  Per UR Regulation:  Reviewed for med. necessity/level of care/duration of stay  If discussed at Long Length of Stay Meetings, dates discussed:    Comments:

## 2014-01-01 MED ORDER — LEVOFLOXACIN 750 MG PO TABS: 750.0000 mg | ORAL_TABLET | Freq: Every day | ORAL | Status: DC

## 2014-01-01 MED ORDER — LEVOFLOXACIN 250 MG PO TABS: 250.0000 mg | ORAL_TABLET | Freq: Every day | ORAL | Status: DC

## 2014-01-01 NOTE — Progress Notes (Signed)
UR Completed.  Brown, Sarah Jane [phone] 01/01/2014  

## 2014-01-01 NOTE — Discharge Summary (Signed)
Physician Discharge Summary  Audrey LikensBrandi Stein ZOX:[id number] DOB: 1976/06/01 DOA: 12/31/2013  PCP: Aida PufferLITTLE,JAMES, MD  Admit date: 12/31/2013 Discharge date: 01/01/2014  Time spent: 35 minutes  Recommendations for Outpatient Follow-up:  1. Follow up with PCP in 1-2 weeks  Discharge Diagnoses:  Principal Problem:   Hypotension Active Problems:   UTI (urinary tract infection)   Discharge Condition: Improved  Diet recommendation: Regular  Filed Weights   01/01/14 0433  Weight: 196 lb 8 oz (89.132 kg)    History of present illness:  Audrey LikensBrandi Stein is a 38 y.o. female with PMH of migraines, anxiety, depression, bipolar disorder presented with episode of migraine, associated with dizziness, found to have hypotension, and UTI; due to persistent low BP hospitalist called for admission/observation;  -Pt denies chest [pain, no SOB, has chronin nonproductive cough related to tobacco use, no fever, chills, no nausea, vomiting or diarrhea, no abdominal pain, no back pain; no focal neurological symptoms, has chronic migraines which responds to [facility]   Hospital Course:  The patient was admitted to the floor and continued on IVF hydration. Blood pressures improved overnight and the patient remained medically stable. Of note, the patient was found to have a UA strongly suggestive of a UTI. She was continued on levaquin and will complete this regimen on discharge.  Consultations:  none  Discharge Exam: Filed Vitals:   12/31/13 1715 12/31/13 1805 12/31/13 2032 01/01/14 0433  BP: 78/51 95/49 90/43  109/49  Pulse: 64  65 91  Temp: 98.2 F (36.8 C)  98.7 F (37.1 C) 98.4 F (36.9 C)  TempSrc: Oral  Oral Oral  Resp: 20  18 18   Height:    5\' 4"  (1.626 m)  Weight:    196 lb 8 oz (89.132 kg)  SpO2: 95%  100% 98%   General: Awake, in nad Cardiovascular: regular, s1, s2 Respiratory: normal resp effort, no wheezing  Discharge Instructions     Medication List         albuterol 108 (90 BASE)  MCG/ACT inhaler  Commonly known as:  PROVENTIL HFA;VENTOLIN HFA  Inhale 2 puffs into the lungs every 4 (four) hours as needed for wheezing or shortness of breath. Ventolin 2 puff every 12 hours and proair 2 puffs every 4 hours     ALLERGY/SINUS HEADACHE PO  Take 2 tablets by mouth 3 (three) times daily as needed (for headache relief).     aspirin-acetaminophen-caffeine 250-250-65 MG per tablet  Commonly known as:  EXCEDRIN MIGRAINE  Take 2 tablets by mouth daily as needed for headache.     cyclobenzaprine 5 MG tablet  Commonly known as:  FLEXERIL  Take 10 mg by mouth 3 (three) times daily as needed for muscle spasms. For muscle spasms     levofloxacin 250 MG tablet  Commonly known as:  LEVAQUIN  Take 1 tablet (250 mg total) by mouth daily.     omeprazole 40 MG capsule  Commonly known as:  PRILOSEC  Take 40 mg by mouth 2 (two) times daily.     TENSION HEADACHE 500-65 MG Tabs  Generic drug:  Acetaminophen-Caffeine  Take 2 tablets by mouth 2 (two) times daily as needed (FOR HEADACHE).     traZODone 100 MG tablet  Commonly known as:  DESYREL  Take 100 mg by mouth at bedtime as needed for sleep.       Allergies  Allergen Reactions  . Risperidone And Related Anaphylaxis  . Tramadol Anaphylaxis  . Aripiprazole Other (See Comments)    Causes seizures  Follow-up Information   Follow up with LITTLE,JAMES, MD. Schedule an appointment as soon as possible for a visit in 1 day.   Specialty:  Family Medicine   Contact information:   [address] [place] [id number] [phone]        The results of significant diagnostics from this hospitalization (including imaging, microbiology, ancillary and laboratory) are listed below for reference.    Significant Diagnostic Studies: Dg Chest 2 View  12/28/2013   CLINICAL DATA:  Right upper quadrant pain. Cough. Nausea and vomiting.  EXAM: CHEST  2 VIEW  COMPARISON:  Two-view chest 10/16/2010  FINDINGS: The heart size and mediastinal  contours are within normal limits. Both lungs are clear. The visualized skeletal structures are unremarkable. Surgical clips are present at the gallbladder fossa.  IMPRESSION: No active cardiopulmonary disease.   Electronically Signed   By: Gennette Pachris  Mattern M.D.   On: 12/28/2013 16:16   Dg Chest Portable 1 View  12/31/2013   CLINICAL DATA:  Migraine, headache, nausea, history of bronchitis.  EXAM: PORTABLE CHEST - 1 VIEW  COMPARISON:  Chest radiograph December 28, 2013  FINDINGS: Cardiomediastinal silhouette is unremarkable. The lungs are clear without pleural effusions or focal consolidations. Trachea projects midline and there is no pneumothorax. Soft tissue planes and included osseous structures are non-suspicious. Surgical clips in the right abdomen most consistent with cholecystectomy. Multiple EKG lines overlie the patient and may obscure subtle underlying pathology.  IMPRESSION: No acute cardiopulmonary process.   Electronically Signed   By: Awilda Metroourtnay  Bloomer   On: 12/31/2013 03:16    Microbiology: No results found for this or any previous visit (from the past 240 hour(s)).   Labs: Basic Metabolic Panel:  Recent Labs Lab 12/28/13 1443 12/31/13 0355  NA 139 135*  K 4.2 4.6  CL 102 102  CO2 24 21  GLUCOSE 112* 115*  BUN 8 10  CREATININE 0.64 0.59  CALCIUM 9.4 8.6   Liver Function Tests:  Recent Labs Lab 12/28/13 1443  AST 50*  ALT 59*  ALKPHOS 73  BILITOT 0.5  PROT 8.3  ALBUMIN 4.0    Recent Labs Lab 12/28/13 1443  LIPASE 29   No results found for this basename: AMMONIA,  in the last 168 hours CBC:  Recent Labs Lab 12/28/13 1443 12/31/13 0355  WBC 8.4 6.0  NEUTROABS 4.7  --   HGB 15.6* 12.1  HCT 43.9 35.2*  MCV 86.4 86.1  PLT 324 292   Cardiac Enzymes: No results found for this basename: CKTOTAL, CKMB, CKMBINDEX, TROPONINI,  in the last 168 hours BNP: BNP (last 3 results) No results found for this basename: PROBNP,  in the last 8760 hours CBG: No results  found for this basename: GLUCAP,  in the last 168 hours  Signed:  CHIU, STEPHEN K  [facility] 01/01/2014, 11:19 AM

## 2014-01-01 NOTE — Discharge Instructions (Addendum)
YOUR BLOOD PRESSURE WAS LOW WITH A SYSTOLIC BP IN THE 80S WHEN YOU WERE FIRST EVALUATED. HOWEVER, YOUR BLOOD PRESSURE CAME UP TO A NORMAL LEVEL AFTER YOU RECEIVED 1 LITER OF IV FLUIDS.   PLEASE SEE YOUR DOCTOR TOMORROW TO HAVE YOUR BLOOD PRESSURE RECHECKED. RETURN TO THE ED FOR FEVER, WORSENING SYMPTOMS OR ANY OTHER URGENT HEALTH CONCERNS.   Cardiac Diet This diet can help prevent heart disease and stroke. Many factors influence your heart health, including eating and exercise habits. Coronary risk rises a lot with abnormal blood fat (lipid) levels. Cardiac meal planning includes limiting unhealthy fats, increasing healthy fats, and making other small dietary changes. General guidelines are as follows:  Adjust calorie intake to reach and maintain desirable body weight.  Limit total fat intake to less than 30% of total calories. Saturated fat should be less than 7% of calories.  Saturated fats are found in animal products and in some vegetable products. Saturated vegetable fats are found in coconut oil, cocoa butter, palm oil, and palm kernel oil. Read labels carefully to avoid these products as much as possible. Use butter in moderation. Choose tub margarines and oils that have 2 grams of fat or less. Good cooking oils are canola and olive oils.  Practice low-fat cooking techniques. Do not fry food. Instead, broil, bake, boil, steam, grill, roast on a rack, stir-fry, or microwave it. Other fat reducing suggestions include:  Remove the skin from poultry.  Remove all visible fat from meats.  Skim the fat off stews, soups, and gravies before serving them.  Steam vegetables in water or broth instead of sauting them in fat.  Avoid foods with trans fat (or hydrogenated oils), such as commercially fried foods and commercially baked goods. Commercial shortening and deep-frying fats will contain trans fat.  Increase intake of fruits, vegetables, whole grains, and legumes to replace foods high in  fat.  Increase consumption of nuts, legumes, and seeds to at least 4 servings weekly. One serving of a legume equals  cup, and 1 serving of nuts or seeds equals  cup.  Choose whole grains more often. Have 3 servings per day (a serving is 1 ounce [oz]).  Eat 4 to 5 servings of vegetables per day. A serving of vegetables is 1 cup of raw leafy vegetables;  cup of raw or cooked cut-up vegetables;  cup of vegetable juice.  Eat 4 to 5 servings of fruit per day. A serving of fruit is 1 medium whole fruit;  cup of dried fruit;  cup of fresh, frozen, or canned fruit;  cup of 100% fruit juice.  Increase your intake of dietary fiber to 20 to 30 grams per day. Insoluble fiber may help lower your risk of heart disease and may help curb your appetite.  Soluble fiber binds cholesterol to be removed from the blood. Foods high in soluble fiber are dried beans, citrus fruits, oats, apples, bananas, broccoli, Brussels sprouts, and eggplant.  Try to include foods fortified with plant sterols or stanols, such as yogurt, breads, juices, or margarines. Choose several fortified foods to achieve a daily intake of 2 to 3 grams of plant sterols or stanols.  Foods with omega-3 fats can help reduce your risk of heart disease. Aim to have a 3.5 oz portion of fatty fish twice per week, such as salmon, mackerel, albacore tuna, sardines, lake trout, or herring. If you wish to take a fish oil supplement, choose one that contains 1 gram of both DHA and EPA.  Limit processed meats to 2 servings (3 oz portion) weekly.  Limit the sodium in your diet to 1500 milligrams (mg) per day. If you have high blood pressure, talk to a registered dietitian about a DASH (Dietary Approaches to Stop Hypertension) eating plan.  Limit sweets and beverages with added sugar, such as soda, to no more than 5 servings per week. One serving is:   1 tablespoon sugar.  1 tablespoon jelly or jam.   cup sorbet.  1 cup lemonade.   cup  regular soda. CHOOSING FOODS Starches  Allowed: Breads: All kinds (wheat, rye, raisin, white, oatmeal, [place], [place], and English muffin bread). Low-fat rolls: English muffins, frankfurter and hamburger buns, bagels, pita bread, tortillas (not fried). Pancakes, waffles, biscuits, and muffins made with recommended oil.  Avoid: Products made with saturated or trans fats, oils, or whole milk products. Butter rolls, cheese breads, croissants. Commercial doughnuts, muffins, sweet rolls, biscuits, waffles, pancakes, store-bought mixes. Crackers  Allowed: Low-fat crackers and snacks: Animal, graham, rye, saltine (with recommended oil, no lard), oyster, and matzo crackers. Bread sticks, melba toast, rusks, flatbread, pretzels, and light popcorn.  Avoid: High-fat crackers: cheese crackers, butter crackers, and those made with coconut, palm oil, or trans fat (hydrogenated oils). Buttered popcorn. Cereals  Allowed: Hot or cold whole-grain cereals.  Avoid: Cereals containing coconut, hydrogenated vegetable fat, or animal fat. Potatoes / Pasta / Rice  Allowed: All kinds of potatoes, rice, and pasta (such as macaroni, spaghetti, and noodles).  Avoid: Pasta or rice prepared with cream sauce or high-fat cheese. Chow mein noodles, [place] fries. Vegetables  Allowed: All vegetables and vegetable juices.  Avoid: Fried vegetables. Vegetables in cream, butter, or high-fat cheese sauces. Limit coconut. Fruit in cream or custard. Protein  Allowed: Limit your intake of meat, seafood, and poultry to no more than 6 oz (cooked weight) per day. All lean, well-trimmed beef, veal, pork, and lamb. All chicken and [place] without skin. All fish and shellfish. Wild game: wild duck, rabbit, pheasant, and venison. Egg whites or low-cholesterol egg substitutes may be used as desired. Meatless dishes: recipes with dried beans, peas, lentils, and tofu (soybean curd). Seeds and nuts: all seeds and most nuts.  Avoid: Prime  grade and other heavily marbled and fatty meats, such as short ribs, spare ribs, rib eye roast or steak, frankfurters, sausage, bacon, and high-fat luncheon meats, mutton. Caviar. Commercially fried fish. Domestic duck, goose, venison sausage. Organ meats: liver, gizzard, heart, chitterlings, brains, kidney, sweetbreads. Dairy  Allowed: Low-fat cheeses: nonfat or low-fat cottage cheese (1% or 2% fat), cheeses made with part skim milk, such as mozzarella, farmers, string, or ricotta. (Cheeses should be labeled no more than 2 to 6 grams fat per oz.). Skim (or 1%) milk: liquid, powdered, or evaporated. Buttermilk made with low-fat milk. Drinks made with skim or low-fat milk or cocoa. Chocolate milk or cocoa made with skim or low-fat (1%) milk. Nonfat or low-fat yogurt.  Avoid: Whole milk cheeses, including colby, cheddar, [place], [address] Jack, [place], [place]rie, [place]ember[place], [address], Swiss, and blue. Creamed cottage cheese, cream cheese. Whole milk and whole milk products, including buttermilk or yogurt made from whole milk, drinks made from whole milk. Condensed milk, evaporated whole milk, and 2% milk. Soups and Combination Foods  Allowed: Low-fat low-sodium soups: broth, dehydrated soups, homemade broth, soups with the fat removed, homemade cream soups made with skim or low-fat milk. Low-fat spaghetti, lasagna, chili, and Spanish rice if low-fat ingredients and low-fat cooking techniques are used.  Avoid: Cream soups  made with whole milk, cream, or high-fat cheese. All other soups. Desserts and Sweets  Allowed: Sherbet, fruit ices, gelatins, meringues, and angel food cake. Homemade desserts with recommended fats, oils, and milk products. Jam, jelly, honey, marmalade, sugars, and syrups. Pure sugar candy, such as gum drops, hard candy, jelly beans, marshmallows, mints, and small amounts of dark chocolate.  Avoid: Commercially prepared cakes, pies, cookies, frosting, pudding, or mixes for these products.  Desserts containing whole milk products, chocolate, coconut, lard, palm oil, or palm kernel oil. Ice cream or ice cream drinks. Candy that contains chocolate, coconut, butter, hydrogenated fat, or unknown ingredients. Buttered syrups. Fats and Oils  Allowed: Vegetable oils: safflower, sunflower, corn, soybean, cottonseed, sesame, canola, olive, or peanut. Non-hydrogenated margarines. Salad dressing or mayonnaise: homemade or commercial, made with a recommended oil. Low or nonfat salad dressing or mayonnaise.  Limit added fats and oils to 6 to 8 tsp per day (includes fats used in cooking, baking, salads, and spreads on bread). Remember to count the "hidden fats" in foods.  Avoid: Solid fats and shortenings: butter, lard, salt pork, bacon drippings. Gravy containing meat fat, shortening, or suet. Cocoa butter, coconut. Coconut oil, palm oil, palm kernel oil, or hydrogenated oils: these ingredients are often used in bakery products, nondairy creamers, whipped toppings, candy, and commercially fried foods. Read labels carefully. Salad dressings made of unknown oils, sour cream, or cheese, such as blue cheese and Roquefort. Cream, all kinds: half-and-half, light, heavy, or whipping. Sour cream or cream cheese (even if "light" or low-fat). Nondairy cream substitutes: coffee creamers and sour cream substitutes made with palm, palm kernel, hydrogenated oils, or coconut oil. Beverages  Allowed: Coffee (regular or decaffeinated), tea. Diet carbonated beverages, mineral water. Alcohol: Check with your caregiver. Moderation is recommended.  Avoid: Whole milk, regular sodas, and juice drinks with added sugar. Condiments  Allowed: All seasonings and condiments. Cocoa powder. "Cream" sauces made with recommended ingredients.  Avoid: Carob powder made with hydrogenated fats. SAMPLE MENU Breakfast   cup orange juice   cup oatmeal  1 slice toast  1 tsp margarine  1 cup skim milk Lunch  [place]  sandwich with 2 oz [place], 2 slices bread  Lettuce and tomato slices  Fresh fruit  Carrot sticks  Coffee or tea Snack  Fresh fruit or low-fat crackers Dinner  3 oz lean ground beef  1 baked potato  1 tsp margarine   cup asparagus  Lettuce salad  1 tbs non-creamy dressing   cup peach slices  1 cup skim milk Document Released: 04/13/2008 Document Revised: 01/04/2012 Document Reviewed: 09/28/2011 Exit[employer] Patient Information 2014 [place], [place].

## 2014-01-03 ENCOUNTER — Emergency Department (HOSPITAL_COMMUNITY): Payer: Medicaid Other

## 2014-01-03 ENCOUNTER — Encounter (HOSPITAL_COMMUNITY): Payer: Self-pay | Admitting: Emergency Medicine

## 2014-01-03 ENCOUNTER — Observation Stay (HOSPITAL_COMMUNITY)
Admission: EM | Admit: 2014-01-03 | Discharge: 2014-01-04 | Disposition: A | Discharge status: Home / Self Care | Payer: Medicaid Other | Attending: Internal Medicine | Admitting: Internal Medicine

## 2014-01-03 DIAGNOSIS — N390 Urinary tract infection, site not specified: Principal | ICD-10-CM | POA: Insufficient documentation

## 2014-01-03 DIAGNOSIS — F411 Generalized anxiety disorder: Secondary | ICD-10-CM | POA: Insufficient documentation

## 2014-01-03 DIAGNOSIS — B182 Chronic viral hepatitis C: Secondary | ICD-10-CM

## 2014-01-03 DIAGNOSIS — M545 Low back pain, unspecified: Secondary | ICD-10-CM | POA: Insufficient documentation

## 2014-01-03 DIAGNOSIS — G43909 Migraine, unspecified, not intractable, without status migrainosus: Secondary | ICD-10-CM | POA: Insufficient documentation

## 2014-01-03 DIAGNOSIS — F319 Bipolar disorder, unspecified: Secondary | ICD-10-CM | POA: Insufficient documentation

## 2014-01-03 DIAGNOSIS — N12 Tubulo-interstitial nephritis, not specified as acute or chronic: Secondary | ICD-10-CM | POA: Diagnosis present

## 2014-01-03 DIAGNOSIS — F17200 Nicotine dependence, unspecified, uncomplicated: Secondary | ICD-10-CM | POA: Insufficient documentation

## 2014-01-03 DIAGNOSIS — R911 Solitary pulmonary nodule: Secondary | ICD-10-CM | POA: Insufficient documentation

## 2014-01-03 DIAGNOSIS — G8929 Other chronic pain: Secondary | ICD-10-CM

## 2014-01-03 DIAGNOSIS — R609 Edema, unspecified: Secondary | ICD-10-CM | POA: Insufficient documentation

## 2014-01-03 DIAGNOSIS — K219 Gastro-esophageal reflux disease without esophagitis: Secondary | ICD-10-CM | POA: Insufficient documentation

## 2014-01-03 DIAGNOSIS — N200 Calculus of kidney: Secondary | ICD-10-CM | POA: Insufficient documentation

## 2014-01-03 DIAGNOSIS — E860 Dehydration: Secondary | ICD-10-CM | POA: Insufficient documentation

## 2014-01-03 DIAGNOSIS — I959 Hypotension, unspecified: Secondary | ICD-10-CM | POA: Insufficient documentation

## 2014-01-03 DIAGNOSIS — R102 Pelvic and perineal pain: Secondary | ICD-10-CM

## 2014-01-03 DIAGNOSIS — N949 Unspecified condition associated with female genital organs and menstrual cycle: Secondary | ICD-10-CM

## 2014-01-03 DIAGNOSIS — B1920 Unspecified viral hepatitis C without hepatic coma: Secondary | ICD-10-CM | POA: Insufficient documentation

## 2014-01-03 DIAGNOSIS — R42 Dizziness and giddiness: Secondary | ICD-10-CM | POA: Insufficient documentation

## 2014-01-03 HISTORY — DX: Nicotine dependence, cigarettes, uncomplicated: F17.210

## 2014-01-03 HISTORY — DX: Unspecified chronic bronchitis: J42

## 2014-01-03 LAB — URINE MICROSCOPIC-ADD ON

## 2014-01-03 LAB — URINALYSIS, ROUTINE W REFLEX MICROSCOPIC
Bilirubin Urine: NEGATIVE
Glucose, UA: NEGATIVE mg/dL
Ketones, ur: NEGATIVE mg/dL
LEUKOCYTES UA: NEGATIVE
Nitrite: NEGATIVE
PROTEIN: NEGATIVE mg/dL
Specific Gravity, Urine: 1.031 — ABNORMAL HIGH (ref 1.005–1.030)
UROBILINOGEN UA: 0.2 mg/dL (ref 0.0–1.0)
pH: 5.5 (ref 5.0–8.0)

## 2014-01-03 LAB — BASIC METABOLIC PANEL
BUN: 5 mg/dL — ABNORMAL LOW (ref 6–23)
CALCIUM: 8.4 mg/dL (ref 8.4–10.5)
CO2: 21 meq/L (ref 19–32)
Chloride: 102 mEq/L (ref 96–112)
Creatinine, Ser: 0.69 mg/dL (ref 0.50–1.10)
GFR calc Af Amer: >90 mL/min (ref >90)
Glucose, Bld: 129 mg/dL — ABNORMAL HIGH (ref 70–99)
Potassium: 3.5 mEq/L — ABNORMAL LOW (ref 3.7–5.3)
SODIUM: 136 meq/L — AB (ref 137–147)

## 2014-01-03 LAB — CBC WITH DIFFERENTIAL/PLATELET
BASOS ABS: 0 10*3/uL (ref 0.0–0.1)
BASOS PCT: 1 % (ref 0–1)
EOS PCT: 5 % (ref 0–5)
Eosinophils Absolute: 0.3 10*3/uL (ref 0.0–0.7)
HCT: 33.5 % — ABNORMAL LOW (ref 36.0–46.0)
Hemoglobin: 11.3 g/dL — ABNORMAL LOW (ref 12.0–15.0)
LYMPHS PCT: 46 % (ref 12–46)
Lymphs Abs: 3.1 10*3/uL (ref 0.7–4.0)
MCH: 28.9 pg (ref 26.0–34.0)
MCHC: 33.7 g/dL (ref 30.0–36.0)
MCV: 85.7 fL (ref 78.0–100.0)
Monocytes Absolute: 0.5 10*3/uL (ref 0.1–1.0)
Monocytes Relative: 8 % (ref 3–12)
Neutro Abs: 2.6 10*3/uL (ref 1.7–7.7)
Neutrophils Relative %: 40 % — ABNORMAL LOW (ref 43–77)
Platelets: 281 10*3/uL (ref 150–400)
RBC: 3.91 MIL/uL (ref 3.87–5.11)
RDW: 12.5 % (ref 11.5–15.5)
WBC: 6.5 10*3/uL (ref 4.0–10.5)

## 2014-01-03 LAB — HEMOGLOBIN A1C
Hgb A1c MFr Bld: 5.3 % (ref <5.7)
Mean Plasma Glucose: 105 mg/dL (ref <117)

## 2014-01-03 LAB — RAPID URINE DRUG SCREEN, HOSP PERFORMED
Amphetamines: NOT DETECTED
Barbiturates: NOT DETECTED
Benzodiazepines: NOT DETECTED
COCAINE: NOT DETECTED
Opiates: POSITIVE — AB
Tetrahydrocannabinol: NOT DETECTED

## 2014-01-03 LAB — HIV ANTIBODY (ROUTINE TESTING W REFLEX): HIV 1&2 Ab, 4th Generation: NONREACTIVE

## 2014-01-03 LAB — HEPATITIS C ANTIBODY: HCV Ab: REACTIVE — AB

## 2014-01-03 LAB — I-STAT CG4 LACTIC ACID, ED: LACTIC ACID, VENOUS: 1.15 mmol/L (ref 0.5–2.2)

## 2014-01-03 LAB — TSH: TSH: 1.31 u[IU]/mL (ref 0.350–4.500)

## 2014-01-03 MED ORDER — SODIUM CHLORIDE 0.9 % IV SOLN
Freq: Once | INTRAVENOUS | Status: AC
  Administered 2014-01-03: 07:00:00 via INTRAVENOUS

## 2014-01-03 MED ORDER — SODIUM CHLORIDE 0.9 % IV BOLUS (SEPSIS)
1000.0000 mL | Freq: Once | INTRAVENOUS | Status: AC
  Administered 2014-01-03: 1000 mL via INTRAVENOUS

## 2014-01-03 MED ORDER — DEXTROSE 5 % IV SOLN
1.0000 g | INTRAVENOUS | Status: DC
  Administered 2014-01-04: 1 g via INTRAVENOUS
  Filled 2014-01-03 (×2): qty 10

## 2014-01-03 MED ORDER — ENOXAPARIN SODIUM 40 MG/0.4ML ~~LOC~~ SOLN
40.0000 mg | Freq: Every day | SUBCUTANEOUS | Status: DC
  Administered 2014-01-03 – 2014-01-04 (×2): 40 mg via SUBCUTANEOUS
  Filled 2014-01-03 (×2): qty 0.4

## 2014-01-03 MED ORDER — DEXTROSE 5 % IV SOLN
1.0000 g | Freq: Once | INTRAVENOUS | Status: AC
  Administered 2014-01-03: 1 g via INTRAVENOUS
  Filled 2014-01-03: qty 10

## 2014-01-03 MED ORDER — CYCLOBENZAPRINE HCL 10 MG PO TABS: 10.0000 mg | ORAL_TABLET | Freq: Three times a day (TID) | ORAL | Status: DC | PRN

## 2014-01-03 MED ORDER — KETOROLAC TROMETHAMINE 30 MG/ML IJ SOLN: 30.0000 mg | Freq: Four times a day (QID) | INTRAMUSCULAR | Status: DC | PRN

## 2014-01-03 MED ORDER — NICOTINE 14 MG/24HR TD PT24
14.0000 mg | MEDICATED_PATCH | Freq: Every day | TRANSDERMAL | Status: DC
  Administered 2014-01-03 – 2014-01-04 (×2): 14 mg via TRANSDERMAL
  Filled 2014-01-03 (×2): qty 1

## 2014-01-03 MED ORDER — ALBUTEROL SULFATE (2.5 MG/3ML) 0.083% IN NEBU: 2.5000 mg | INHALATION_SOLUTION | RESPIRATORY_TRACT | Status: DC | PRN

## 2014-01-03 MED ORDER — HYDROCODONE-ACETAMINOPHEN 5-325 MG PO TABS
1.0000 | ORAL_TABLET | ORAL | Status: DC | PRN
  Administered 2014-01-03 – 2014-01-04 (×4): 1 via ORAL
  Filled 2014-01-03 (×4): qty 1

## 2014-01-03 MED ORDER — POTASSIUM CHLORIDE CRYS ER 20 MEQ PO TBCR
40.0000 meq | EXTENDED_RELEASE_TABLET | Freq: Once | ORAL | Status: AC
  Administered 2014-01-03: 40 meq via ORAL
  Filled 2014-01-03: qty 2

## 2014-01-03 MED ORDER — ASPIRIN-ACETAMINOPHEN-CAFFEINE 250-250-65 MG PO TABS
2.0000 | ORAL_TABLET | Freq: Every day | ORAL | Status: DC | PRN
  Filled 2014-01-03: qty 2

## 2014-01-03 MED ORDER — PANTOPRAZOLE SODIUM 40 MG PO TBEC
80.0000 mg | DELAYED_RELEASE_TABLET | Freq: Every day | ORAL | Status: DC
  Administered 2014-01-03 – 2014-01-04 (×2): 80 mg via ORAL
  Filled 2014-01-03 (×2): qty 2

## 2014-01-03 MED ORDER — POLYETHYLENE GLYCOL 3350 17 G PO PACK
17.0000 g | PACK | Freq: Every day | ORAL | Status: DC | PRN
  Filled 2014-01-03: qty 1

## 2014-01-03 MED ORDER — ONDANSETRON HCL 4 MG/2ML IJ SOLN: 4.0000 mg | Freq: Four times a day (QID) | INTRAMUSCULAR | Status: DC | PRN

## 2014-01-03 MED ORDER — ONDANSETRON HCL 4 MG PO TABS: 4.0000 mg | ORAL_TABLET | Freq: Four times a day (QID) | ORAL | Status: DC | PRN

## 2014-01-03 MED ORDER — TRAZODONE HCL 100 MG PO TABS
100.0000 mg | ORAL_TABLET | Freq: Every evening | ORAL | Status: DC | PRN
  Filled 2014-01-03: qty 1

## 2014-01-03 MED ORDER — SODIUM CHLORIDE 0.9 % IJ SOLN
3.0000 mL | Freq: Two times a day (BID) | INTRAMUSCULAR | Status: DC
  Administered 2014-01-03 – 2014-01-04 (×3): 3 mL via INTRAVENOUS

## 2014-01-03 NOTE — Evaluation (Addendum)
Physical Therapy Evaluation and Discharge Patient Details Name: Audrey OchsBrandi Marie Stein MRN: [id number] DOB: 01-10-1976 Today's Date: 01/03/2014   History of Present Illness  Pt is a 38 y/o female admitted for possible refractory UTI, recurrent hypotension and dehydration. Pt recently discharged for migraine and UTI.   Clinical Impression  Patient evaluated by Physical Therapy with no further acute PT needs identified. All education has been completed and the patient has no further questions. See below for any follow-up Physial Therapy or equipment needs. PT is signing off. If needs change, please reconsult.      Follow Up Recommendations No PT follow up    Equipment Recommendations  None recommended by PT    Recommendations for Other Services       Precautions / Restrictions Precautions Precautions: Fall Restrictions Weight Bearing Restrictions: No      Mobility  Bed Mobility Overal bed mobility: Independent                Transfers Overall transfer level: Independent Equipment used: None             General transfer comment: No unsteadiness noted. Pt able to perform x5 sit<>stands without use of UE's in 10 seconds.   Ambulation/Gait Ambulation/Gait assistance: Modified independent (Device/Increase time) Ambulation Distance (Feet): 200 Feet Assistive device: None Gait Pattern/deviations: WFL(Within Functional Limits) Gait velocity: Decreased Gait velocity interpretation: Below normal speed for age/gender General Gait Details: Appears somewhat unsteady occasionally, however does not ever lose balance or require assist.   Stairs Stairs: Yes Stairs assistance: Modified independent (Device/Increase time) Stair Management: One rail Left Number of Stairs: 3 General stair comments: Pt negotiated stairs without difficulty.   Wheelchair Mobility    Modified Rankin (Stroke Patients Only)       Balance Overall balance assessment: No apparent balance deficits  (not formally assessed)                                           Pertinent Vitals/Pain Vitals stable throughout session on RA. O2 sats at 92%.    Home Living Family/patient expects to be discharged to:: Private residence Living Arrangements: Spouse/significant other Available Help at Discharge: Family;Available PRN/intermittently Type of Home: House Home Access: Stairs to enter Entrance Stairs-Rails: Left Entrance Stairs-Number of Steps: 3 Home Layout: One level Home Equipment: None      Prior Function Level of Independence: Independent               Hand Dominance   Dominant Hand: Right    Extremity/Trunk Assessment   Upper Extremity Assessment: Overall WFL for tasks assessed. Pt's MMT score did not match functional ability demonstrated throughout session.           Lower Extremity Assessment: Overall WFL for tasks assessed. Pt's MMT score did not match functional ability demonstrated. General score of 3-/5 when tested, however was able to perform x5 sit<>stand with no UE support in 10 seconds.       Cervical / Trunk Assessment: Normal  Communication   Communication: No difficulties  Cognition Arousal/Alertness: Awake/alert Behavior During Therapy: WFL for tasks assessed/performed Overall Cognitive Status: Within Functional Limits for tasks assessed                      General Comments      Exercises        Assessment/Plan    PT  Assessment Patent does not need any further PT services  PT Diagnosis     PT Problem List    PT Treatment Interventions     PT Goals (Current goals can be found in the Care Plan section) Acute Rehab PT Goals PT Goal Formulation: No goals set, d/c therapy    Frequency     Barriers to discharge        Co-evaluation               End of Session Equipment Utilized During Treatment: Gait belt Activity Tolerance: Patient tolerated treatment well Patient left: in bed;with call  bell/phone within reach;with family/visitor present Nurse Communication: Mobility status    Functional Assessment Tool Used: Clinical judgement Functional Limitation: Mobility: Walking and moving around Mobility: Walking and Moving Around Current Status ([id number]): At least 1 percent but less than 20 percent impaired, limited or restricted Mobility: Walking and Moving Around Goal Status [phone]): At least 1 percent but less than 20 percent impaired, limited or restricted Mobility: Walking and Moving Around Discharge Status [phone]([id number]): At least 1 percent but less than 20 percent impaired, limited or restricted    Time: [id number] PT Time Calculation (min): 11 min   Charges:   PT Evaluation $Initial PT Evaluation Tier I: 1 Procedure     PT G Codes:   Functional Assessment Tool Used: Clinical judgement Functional Limitation: Mobility: Walking and moving around    [place]Hamilton, Laura 01/03/2014, 4:46 PM  Ruthann CancerLaura Hamilton, PT, DPT Acute Rehabilitation Services Pager: [phone]

## 2014-01-03 NOTE — Progress Notes (Signed)
  Echocardiogram 2D Echocardiogram has been performed.  Cathie BeamsGREGORY, ANGELA 01/03/2014, 2:53 PM

## 2014-01-03 NOTE — ED Notes (Signed)
Admitting MD at bedside.

## 2014-01-03 NOTE — ED Notes (Signed)
Patient states she has been having chest pain since this afternoon.  Patient does have cough and is having pain when she coughs.
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
## 2014-01-03 NOTE — ED Notes (Signed)
Pt. reports persistent low back pain and dysuria for several days with lightheadedness , seen here 3 days ago diagnosed with UTI prescribed with antibiotic. No fever or chills.

## 2014-01-03 NOTE — ED Notes (Signed)
Patient continues with dizziness and urinary problems after being on antibiotics for two days.

## 2014-01-03 NOTE — ED Notes (Signed)
Attempted to obtain IV, unsuccessful x2

## 2014-01-03 NOTE — ED Notes (Signed)
Patient to CT.

## 2014-01-03 NOTE — ED Notes (Signed)
Patient offered ibuprofen or tylenol for pain.  Patient states that they don't work for her.  Patient explained that narcotic pain meds are not an option due to low BP.  Patient understood.

## 2014-01-03 NOTE — ED Notes (Signed)
Patient returned from CT

## 2014-01-04 DIAGNOSIS — K219 Gastro-esophageal reflux disease without esophagitis: Secondary | ICD-10-CM

## 2014-01-04 DIAGNOSIS — B182 Chronic viral hepatitis C: Secondary | ICD-10-CM

## 2014-01-04 LAB — BASIC METABOLIC PANEL
BUN: 3 mg/dL — ABNORMAL LOW (ref 6–23)
CO2: 22 mEq/L (ref 19–32)
Calcium: 8.6 mg/dL (ref 8.4–10.5)
Chloride: 104 mEq/L (ref 96–112)
Creatinine, Ser: 0.57 mg/dL (ref 0.50–1.10)
GFR calc non Af Amer: >90 mL/min (ref >90)
GLUCOSE: 146 mg/dL — AB (ref 70–99)
POTASSIUM: 3.7 meq/L (ref 3.7–5.3)
SODIUM: 138 meq/L (ref 137–147)

## 2014-01-04 LAB — CBC
HCT: 36 % (ref 36.0–46.0)
HEMOGLOBIN: 12.5 g/dL (ref 12.0–15.0)
MCH: 29.3 pg (ref 26.0–34.0)
MCHC: 34.7 g/dL (ref 30.0–36.0)
MCV: 84.3 fL (ref 78.0–100.0)
Platelets: 262 10*3/uL (ref 150–400)
RBC: 4.27 MIL/uL (ref 3.87–5.11)
RDW: 12.2 % (ref 11.5–15.5)
WBC: 6.1 10*3/uL (ref 4.0–10.5)

## 2014-01-04 LAB — URINE CULTURE
Colony Count: NO GROWTH
Culture: NO GROWTH

## 2014-01-04 MED ORDER — HYDROCODONE-ACETAMINOPHEN 5-325 MG PO TABS: 1.0000 | ORAL_TABLET | Freq: Four times a day (QID) | ORAL | Status: DC | PRN

## 2014-01-04 MED ORDER — CYCLOBENZAPRINE HCL 5 MG PO TABS: 10.0000 mg | ORAL_TABLET | Freq: Every evening | ORAL | Status: DC | PRN

## 2014-01-04 NOTE — Progress Notes (Signed)
UR Completed Taylour Lietzke Graves-Bigelow, RN,BSN [phone]  

## 2014-01-04 NOTE — Discharge Summary (Signed)
Physician Discharge Summary  [name] ZOX:[id number] DOB: [birth year] DOA: 01/03/2014  PCP: Aida PufferLITTLE,JAMES, MD  Admit date: 01/03/2014 Discharge date: 01/04/2014  Time spent: >30 minutes  Recommendations for Outpatient Follow-up:  Follow pending results at discharge (HCV viral load; GC/Chlamydia; cortisol) Please repeat CT chest in 6-12 months Assist patient in quitting smoking Patient will benefit of outpatient referral to urology if lower back pain continues; as seen on CT there is some small kidney stones. If that failed will recommend pain clinic referral for chronic pain.   Discharge Diagnoses:  Hypotension Partially treated UTI Hepatitis C Migraines GERD Bipolar disorder  Lung nodule Tobacco abuse   Discharge Condition: stable and improved.  Diet recommendation: regular diet. Advised to keep herself well hydrated   Filed Weights   01/03/14 0135 01/03/14 0845 01/04/14 0603  Weight: 88.905 kg (196 lb) 90.7 kg (199 lb 15.3 oz) 89.948 kg (198 lb 4.8 oz)    History of present illness:  38 y.o. year-old female with history of migraines, anxiety, depression, bipolar disorder who presents with flank pain. The patient was last at their baseline health about a week ago. She was admitted to [facility] from 6/15 through 6/16 with migraine, UTI, and hypotension. Her urinalysis at that time had smal LE, 7-10 WBC, many bacteria and she was started on levofloxacin. Her blood pressure improved with IVF and she was discharged with levofloxacin. She returns after she continued to have dysuria and worsening flank pain. She describes a sharp lower right flank pain 8/10 that lasts for approximately 2 minutes and then resolved completely. It comes and goes. She also states she has had some lower extremity edema and some lightheadedness when she is sitting or standing up. She states that she has been eating and drinking normally but eating more often than usual.    Hospital Course:   1-unspecified hypotension: appears to be secondary to dehydration and use of pain medications/muscle relaxant -patient mildly orthostatic on admission -resolved after IVF's given -no signs of systemic infection -normal 2-D echo -patient ask to minimize narcotics and flexeril -follow up with PCP in 5 days  2-Partially treated UTI: continue therapy with levaquin as previously indicated -no growth on urine culture -chlamydia and GC ordered and pending at discharge  3-Tobacco abuse: cessation counseling provided  4-migraine: stable. Continue PRN outpatient therapy  5-bipolar disorder: continue trazodone   6-hepatitis C -viral quantification pending at discharge -patient asymptomatic and just positive antibody  -could be just chronic infection in the past and no active disease; PCP to follow results and dictate further treatment as indicated  7-GERD: continue PPI  8-lung nodule:patient with hx of tobacco abuse 3. 5 mm subpleural left lower lobe nodule. If the patient is at high  risk for bronchogenic carcinoma, follow-up chest CT at 6-12 months  is recommended.   Procedures:  See below for x-ray reports   2-D echo  - Left ventricle: The cavity size was normal. Systolic function was normal. The estimated ejection fraction was in the range of 55% to 60%. Wall motion was normal; there were no regional wall motion abnormalities. - Mitral valve: There was mild regurgitation.   Consultations:  None   Discharge Exam: Filed Vitals:   01/04/14 0932  BP: 108/57  Pulse: 72  Temp:   Resp:     General: afebrile, NAD, denies N/V or abd pain. Patient reports still having some intermittent lower back pain, but improved in comparison to degree of pain on admission. Cardiovascular: S1 and S2,  no rubs, gallops or murmurs  Respiratory: CTA bilaterally Abd: soft, NT, ND, positive BS Extremities: no edema, no cyanosis   Discharge Instructions You were cared for by a  hospitalist during your hospital stay. If you have any questions about your discharge medications or the care you received while you were in the hospital after you are discharged, you can call the unit and asked to speak with the hospitalist on call if the hospitalist that took care of you is not available. Once you are discharged, your primary care physician will handle any further medical issues. Please note that NO REFILLS for any discharge medications will be authorized once you are discharged, as it is imperative that you return to your primary care physician (or establish a relationship with a primary care physician if you do not have one) for your aftercare needs so that they can reassess your need for medications and monitor your lab values.  Discharge Instructions   Discharge instructions    Complete by:  As directed   Arrange follow up with PCP in 5 days Take medications as prescribed Maintain yourself well hydrated Limit the amount of pain medications and flexeril that you have been taking (can affect/contribute to hypotension)            Medication List    STOP taking these medications       ALLERGY/SINUS HEADACHE PO     TENSION HEADACHE 500-65 MG Tabs  Generic drug:  Acetaminophen-Caffeine      TAKE these medications       albuterol 108 (90 BASE) MCG/ACT inhaler  Commonly known as:  PROVENTIL HFA;VENTOLIN HFA  Inhale 2 puffs into the lungs every 4 (four) hours as needed for wheezing or shortness of breath. Ventolin 2 puff every 12 hours and proair 2 puffs every 4 hours     aspirin-acetaminophen-caffeine 250-250-65 MG per tablet  Commonly known as:  EXCEDRIN MIGRAINE  Take 2 tablets by mouth daily as needed for headache.     cyclobenzaprine 5 MG tablet  Commonly known as:  FLEXERIL  Take 2 tablets (10 mg total) by mouth at bedtime as needed for muscle spasms. For muscle spasms     HYDROcodone-acetaminophen 5-325 MG per tablet  Commonly known as:  NORCO/VICODIN  Take  1 tablet by mouth every 6 (six) hours as needed (breakthrough pain).     levofloxacin 250 MG tablet  Commonly known as:  LEVAQUIN  Take 1 tablet (250 mg total) by mouth daily.     omeprazole 40 MG capsule  Commonly known as:  PRILOSEC  Take 40 mg by mouth 2 (two) times daily.     traZODone 100 MG tablet  Commonly known as:  DESYREL  Take 100 mg by mouth at bedtime as needed for sleep.       Allergies  Allergen Reactions  . Risperidone And Related Anaphylaxis  . Tramadol Anaphylaxis  . Aripiprazole Other (See Comments)    Causes seizures       Follow-up Information   Follow up with LITTLE,JAMES, MD In 3 days.   Specialty:  Family Medicine   Contact information:   [address] [place] [address] [phone]       The results of significant diagnostics from this hospitalization (including imaging, microbiology, ancillary and laboratory) are listed below for reference.    Significant Diagnostic Studies: Ct Abdomen Pelvis Wo Contrast  01/03/2014   CLINICAL DATA:  Left flank pain.  Evaluate for renal stones.  EXAM:  CT ABDOMEN AND PELVIS WITHOUT CONTRAST  TECHNIQUE: Multidetector CT imaging of the abdomen and pelvis was performed following the standard protocol without IV contrast.  COMPARISON:  Prior CT from 09/17/2013  FINDINGS: Subsegmental atelectasis seen dependently within the visualized lung bases. Subcentimeter nodule measuring 5 mm present within the peripheral left lower lobe (series 3, image 9).  Previously described hypodensity within the inferior right hepatic lobe is grossly stable. The liver is otherwise unremarkable and demonstrates a normal unenhanced appearance. Gallbladder is surgically absent. No biliary ductal dilatation. The spleen, adrenal glands, and pancreas demonstrate a normal unenhanced appearance.  Tiny punctate stones measuring up to 3 mm are seen within the kidneys bilaterally. No obstructive stone seen along the course of either renal collecting  system or within the bladder. No hydronephrosis or hydroureter.  Stomach is within normal limits. No evidence of bowel obstruction. No abnormal wall thickening or inflammatory changes seen about the bowels. Appendix well visualized in the right lower quadrant and is of normal caliber and appearance without associated inflammatory changes to suggest acute appendicitis.  Bladder is within normal limits. Uterus and ovaries are unremarkable.  No free air or fluid. No enlarged intra-abdominal pelvic lymph nodes.  No acute osseous abnormality. No worrisome lytic or blastic osseous lesions identified.  IMPRESSION: 1. Bilateral nonobstructive nephrolithiasis measuring up to 3 mm. No CT evidence of obstructive uropathy. 2. No other acute intra-abdominal pelvic process. 3. 5 mm subpleural left lower lobe nodule. If the patient is at high risk for bronchogenic carcinoma, follow-up chest CT at 6-12 months is recommended. If the patient is at low risk for bronchogenic carcinoma, follow-up chest CT at 12 months is recommended. This recommendation follows the consensus statement: Guidelines for Management of Small Pulmonary Nodules Detected on CT Scans: A Statement from the Fleischner Society as published in Radiology 2005;237:395-400.   Electronically Signed   By: Rise MuBenjamin  McClintock M.D.   On: 01/03/2014 07:03   Dg Chest 2 View  12/28/2013   CLINICAL DATA:  Right upper quadrant pain. Cough. Nausea and vomiting.  EXAM: CHEST  2 VIEW  COMPARISON:  Two-view chest 10/16/2010  FINDINGS: The heart size and mediastinal contours are within normal limits. Both lungs are clear. The visualized skeletal structures are unremarkable. Surgical clips are present at the gallbladder fossa.  IMPRESSION: No active cardiopulmonary disease.   Electronically Signed   By: Gennette Pachris  Mattern M.D.   On: 12/28/2013 16:16   Dg Chest Port 1 View  01/03/2014   CLINICAL DATA:  DYSURIA BACK PAIN  EXAM: PORTABLE CHEST - 1 VIEW  COMPARISON:  Portable chest  radiograph 12/31/2013  FINDINGS: Cardiac silhouette mildly prominent. There is prominence of interstitial markings and peribronchial cuffing. These findings are mild. No focal regions of consolidation or focal infiltrates. No acute osseous abnormalities.  IMPRESSION: Mild pulmonary edema. No focal infiltrates or focal regions of consolidation.   Electronically Signed   By: Salome HolmesHector  Cooper M.D.   On: 01/03/2014 07:42   Dg Chest Portable 1 View  12/31/2013   CLINICAL DATA:  Migraine, headache, nausea, history of bronchitis.  EXAM: PORTABLE CHEST - 1 VIEW  COMPARISON:  Chest radiograph December 28, 2013  FINDINGS: Cardiomediastinal silhouette is unremarkable. The lungs are clear without pleural effusions or focal consolidations. Trachea projects midline and there is no pneumothorax. Soft tissue planes and included osseous structures are non-suspicious. Surgical clips in the right abdomen most consistent with cholecystectomy. Multiple EKG lines overlie the patient and may obscure subtle underlying pathology.  IMPRESSION:  No acute cardiopulmonary process.   Electronically Signed   By: Awilda Metro   On: 12/31/2013 03:16    Microbiology: Recent Results (from the past 240 hour(s))  URINE CULTURE     Status: None   Collection Time    01/03/14  3:50 AM      Result Value Ref Range Status   Specimen Description URINE, CLEAN CATCH   Final   Special Requests NONE   Final   Culture  Setup Time     Final   Value: 01/03/2014 04:19     Performed at [facility] Count     Final   Value: NO GROWTH     Performed at [facility]   Culture     Final   Value: NO GROWTH     Performed at [facility]   Report Status 01/04/2014 FINAL   Final     Labs: Basic Metabolic Panel:  Recent Labs Lab 12/28/13 1443 12/31/13 0355 01/03/14 0452 01/04/14 0850  NA 139 135* 136* 138  K 4.2 4.6 3.5* 3.7  CL 102 102 102 104  CO2 24 21 21 22   GLUCOSE 112* 115* 129* 146*  BUN 8 10 5* 3*   CREATININE 0.64 0.59 0.69 0.57  CALCIUM 9.4 8.6 8.4 8.6   Liver Function Tests:  Recent Labs Lab 12/28/13 1443  AST 50*  ALT 59*  ALKPHOS 73  BILITOT 0.5  PROT 8.3  ALBUMIN 4.0    Recent Labs Lab 12/28/13 1443  LIPASE 29   CBC:  Recent Labs Lab 12/28/13 1443 12/31/13 0355 01/03/14 0452 01/04/14 0850  WBC 8.4 6.0 6.5 6.1  NEUTROABS 4.7  --  2.6  --   HGB 15.6* 12.1 11.3* 12.5  HCT 43.9 35.2* 33.5* 36.0  MCV 86.4 86.1 85.7 84.3  PLT 324 292 281 262    Signed:  Vassie Loll  [facility] 01/04/2014, 12:19 PM

## 2014-01-04 NOTE — Progress Notes (Signed)
The patient did not have any acute changes overnight.  She received Norco x 1 for right flank pain.  Her VS remained stable and she slept for most of the night.  Her husband is at the bedside.

## 2014-01-05 LAB — CORTISOL-AM, BLOOD: Cortisol - AM: 13.8 ug/dL (ref 4.3–22.4)

## 2014-01-05 LAB — GC/CHLAMYDIA PROBE AMP
CT Probe RNA: NEGATIVE
GC Probe RNA: NEGATIVE

## 2014-01-06 ENCOUNTER — Emergency Department (HOSPITAL_COMMUNITY): Payer: Medicaid Other

## 2014-01-06 ENCOUNTER — Emergency Department (HOSPITAL_COMMUNITY)
Admission: EM | Admit: 2014-01-06 | Discharge: 2014-01-07 | Disposition: A | Discharge status: Home / Self Care | Payer: Medicaid Other | Attending: Emergency Medicine | Admitting: Emergency Medicine

## 2014-01-06 ENCOUNTER — Encounter (HOSPITAL_COMMUNITY): Payer: Self-pay | Admitting: Emergency Medicine

## 2014-01-06 DIAGNOSIS — Z862 Personal history of diseases of the blood and blood-forming organs and certain disorders involving the immune mechanism: Secondary | ICD-10-CM | POA: Insufficient documentation

## 2014-01-06 DIAGNOSIS — R059 Cough, unspecified: Secondary | ICD-10-CM

## 2014-01-06 DIAGNOSIS — R0600 Dyspnea, unspecified: Secondary | ICD-10-CM

## 2014-01-06 DIAGNOSIS — M7989 Other specified soft tissue disorders: Secondary | ICD-10-CM | POA: Insufficient documentation

## 2014-01-06 DIAGNOSIS — R0989 Other specified symptoms and signs involving the circulatory and respiratory systems: Secondary | ICD-10-CM | POA: Insufficient documentation

## 2014-01-06 DIAGNOSIS — R05 Cough: Secondary | ICD-10-CM

## 2014-01-06 DIAGNOSIS — Z87442 Personal history of urinary calculi: Secondary | ICD-10-CM | POA: Insufficient documentation

## 2014-01-06 DIAGNOSIS — F17200 Nicotine dependence, unspecified, uncomplicated: Secondary | ICD-10-CM | POA: Insufficient documentation

## 2014-01-06 DIAGNOSIS — Z8659 Personal history of other mental and behavioral disorders: Secondary | ICD-10-CM | POA: Insufficient documentation

## 2014-01-06 DIAGNOSIS — Z8639 Personal history of other endocrine, nutritional and metabolic disease: Secondary | ICD-10-CM | POA: Insufficient documentation

## 2014-01-06 DIAGNOSIS — R0602 Shortness of breath: Secondary | ICD-10-CM | POA: Insufficient documentation

## 2014-01-06 DIAGNOSIS — R079 Chest pain, unspecified: Secondary | ICD-10-CM

## 2014-01-06 DIAGNOSIS — Z87440 Personal history of urinary (tract) infections: Secondary | ICD-10-CM | POA: Insufficient documentation

## 2014-01-06 DIAGNOSIS — Z8742 Personal history of other diseases of the female genital tract: Secondary | ICD-10-CM | POA: Insufficient documentation

## 2014-01-06 DIAGNOSIS — Z792 Long term (current) use of antibiotics: Secondary | ICD-10-CM | POA: Insufficient documentation

## 2014-01-06 DIAGNOSIS — R5381 Other malaise: Secondary | ICD-10-CM | POA: Insufficient documentation

## 2014-01-06 DIAGNOSIS — R0789 Other chest pain: Secondary | ICD-10-CM | POA: Insufficient documentation

## 2014-01-06 DIAGNOSIS — Z79899 Other long term (current) drug therapy: Secondary | ICD-10-CM | POA: Insufficient documentation

## 2014-01-06 DIAGNOSIS — R0609 Other forms of dyspnea: Secondary | ICD-10-CM | POA: Insufficient documentation

## 2014-01-06 DIAGNOSIS — K219 Gastro-esophageal reflux disease without esophagitis: Secondary | ICD-10-CM | POA: Insufficient documentation

## 2014-01-06 DIAGNOSIS — Z8709 Personal history of other diseases of the respiratory system: Secondary | ICD-10-CM | POA: Insufficient documentation

## 2014-01-06 DIAGNOSIS — R5383 Other fatigue: Secondary | ICD-10-CM

## 2014-01-06 HISTORY — DX: Hyperlipidemia, unspecified: E78.5

## 2014-01-06 LAB — CBC
HEMATOCRIT: 40.1 % (ref 36.0–46.0)
HEMOGLOBIN: 14.3 g/dL (ref 12.0–15.0)
MCH: 30.4 pg (ref 26.0–34.0)
MCHC: 35.7 g/dL (ref 30.0–36.0)
MCV: 85.3 fL (ref 78.0–100.0)
Platelets: 254 10*3/uL (ref 150–400)
RBC: 4.7 MIL/uL (ref 3.87–5.11)
RDW: 12.5 % (ref 11.5–15.5)
WBC: 6.8 10*3/uL (ref 4.0–10.5)

## 2014-01-06 LAB — BASIC METABOLIC PANEL
BUN: 5 mg/dL — AB (ref 6–23)
CHLORIDE: 103 meq/L (ref 96–112)
CO2: 20 meq/L (ref 19–32)
Calcium: 9.2 mg/dL (ref 8.4–10.5)
Creatinine, Ser: 0.53 mg/dL (ref 0.50–1.10)
GFR calc Af Amer: >90 mL/min (ref >90)
GFR calc non Af Amer: >90 mL/min (ref >90)
Glucose, Bld: 85 mg/dL (ref 70–99)
Potassium: 3.9 mEq/L (ref 3.7–5.3)
Sodium: 139 mEq/L (ref 137–147)

## 2014-01-06 LAB — PRO B NATRIURETIC PEPTIDE: Pro B Natriuretic peptide (BNP): 141.3 pg/mL — ABNORMAL HIGH (ref 0–125)

## 2014-01-06 LAB — I-STAT TROPONIN, ED: Troponin i, poc: 0.02 ng/mL (ref 0.00–0.08)

## 2014-01-06 NOTE — ED Notes (Signed)
Patient here from home with complaint of chest pain starting this morning after waking up. States that she also noticed both of her legs are very swollen. Explains that she has also been feeling short of breath, nauseated, and fatigued.

## 2014-01-06 NOTE — ED Notes (Signed)
Patient transported to X-ray 

## 2014-01-07 LAB — TROPONIN I

## 2014-01-07 LAB — D-DIMER, QUANTITATIVE (NOT AT ARMC): D-Dimer, Quant: 0.37 ug/mL-FEU (ref 0.00–0.48)

## 2014-01-07 MED ORDER — KETOROLAC TROMETHAMINE 30 MG/ML IJ SOLN
30.0000 mg | Freq: Once | INTRAMUSCULAR | Status: AC
  Administered 2014-01-07: 30 mg via INTRAVENOUS
  Filled 2014-01-07: qty 1
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
## 2014-01-07 NOTE — Discharge Instructions (Signed)
If you were given medicines take as directed.  If you are on coumadin or contraceptives realize their levels and effectiveness is altered by many different medicines.  If you have any reaction (rash, tongues swelling, other) to the medicines stop taking and see a physician.   Please follow up as directed and return to the ER or see a physician for new or worsening symptoms.  Thank you. Filed Vitals:   01/07/14 0045 01/07/14 0100 01/07/14 0115 01/07/14 0130  BP: 126/79 126/70 128/76 127/74  Pulse: 91 92 90 95  Temp:      TempSrc:      Resp: 22 19 20 19   Height:      Weight:      SpO2: 94% 96% 98% 96%    Chest Pain (Nonspecific) It is often hard to give a specific diagnosis for the cause of chest pain. There is always a chance that your pain could be related to something serious, such as a heart attack or a blood clot in the lungs. You need to follow up with your health care provider for further evaluation. CAUSES   Heartburn.  Pneumonia or bronchitis.  Anxiety or stress.  Inflammation around your heart (pericarditis) or lung (pleuritis or pleurisy).  A blood clot in the lung.  A collapsed lung (pneumothorax). It can develop suddenly on its own (spontaneous pneumothorax) or from trauma to the chest.  Shingles infection (herpes zoster virus). The chest wall is composed of bones, muscles, and cartilage. Any of these can be the source of the pain.  The bones can be bruised by injury.  The muscles or cartilage can be strained by coughing or overwork.  The cartilage can be affected by inflammation and become sore (costochondritis). DIAGNOSIS  Lab tests or other studies may be needed to find the cause of your pain. Your health care provider may have you take a test called an ambulatory electrocardiogram (ECG). An ECG records your heartbeat patterns over a 24-hour period. You may also have other tests, such as:  Transthoracic echocardiogram (TTE). During echocardiography, sound waves  are used to evaluate how blood flows through your heart.  Transesophageal echocardiogram (TEE).  Cardiac monitoring. This allows your health care provider to monitor your heart rate and rhythm in real time.  Holter monitor. This is a portable device that records your heartbeat and can help diagnose heart arrhythmias. It allows your health care provider to track your heart activity for several days, if needed.  Stress tests by exercise or by giving medicine that makes the heart beat faster. TREATMENT   Treatment depends on what may be causing your chest pain. Treatment may include:  Acid blockers for heartburn.  Anti-inflammatory medicine.  Pain medicine for inflammatory conditions.  Antibiotics if an infection is present.  You may be advised to change lifestyle habits. This includes stopping smoking and avoiding alcohol, caffeine, and chocolate.  You may be advised to keep your head raised (elevated) when sleeping. This reduces the chance of acid going backward from your stomach into your esophagus. Most of the time, nonspecific chest pain will improve within 2-3 days with rest and mild pain medicine.  HOME CARE INSTRUCTIONS   If antibiotics were prescribed, take them as directed. Finish them even if you start to feel better.  For the next few days, avoid physical activities that bring on chest pain. Continue physical activities as directed.  Do not use any tobacco products, including cigarettes, chewing tobacco, or electronic cigarettes.  Avoid drinking alcohol.  Only take medicine as directed by your health care provider.  Follow your health care provider's suggestions for further testing if your chest pain does not go away.  Keep any follow-up appointments you made. If you do not go to an appointment, you could develop lasting (chronic) problems with pain. If there is any problem keeping an appointment, call to reschedule. SEEK MEDICAL CARE IF:   Your chest pain does not  go away, even after treatment.  You have a rash with blisters on your chest.  You have a fever. SEEK IMMEDIATE MEDICAL CARE IF:   You have increased chest pain or pain that spreads to your arm, neck, jaw, back, or abdomen.  You have shortness of breath.  You have an increasing cough, or you cough up blood.  You have severe back or abdominal pain.  You feel nauseous or vomit.  You have severe weakness.  You faint.  You have chills. This is an emergency. Do not wait to see if the pain will go away. Get medical help at once. Call your local emergency services (911 in [place] Do not drive yourself to the hospital. MAKE SURE YOU:   Understand these instructions.  Will watch your condition.  Will get help right away if you are not doing well or get worse. Document Released: 04/14/2005 Document Revised: 07/10/2013 Document Reviewed: 02/08/2008 [facility] Patient Information 2015 [place], [place]. This information is not intended to replace advice given to you by your health care provider. Make sure you discuss any questions you have with your health care provider.

## 2014-01-07 NOTE — ED Notes (Addendum)
Patient now states her head is still hurting, denies chest pain

## 2014-01-08 LAB — HEPATITIS C VRS RNA DETECT BY PCR-QUAL: Hepatitis C Vrs RNA by PCR-Qual: POSITIVE — AB

## 2014-01-09 ENCOUNTER — Encounter (HOSPITAL_COMMUNITY): Payer: Self-pay | Admitting: Emergency Medicine

## 2014-01-09 ENCOUNTER — Emergency Department (HOSPITAL_COMMUNITY)
Admission: EM | Admit: 2014-01-09 | Discharge: 2014-01-10 | Disposition: A | Discharge status: Other Acute Inpatient Hospital | Payer: Medicaid Other | Attending: Emergency Medicine | Admitting: Emergency Medicine

## 2014-01-09 DIAGNOSIS — Z8742 Personal history of other diseases of the female genital tract: Secondary | ICD-10-CM | POA: Insufficient documentation

## 2014-01-09 DIAGNOSIS — K219 Gastro-esophageal reflux disease without esophagitis: Secondary | ICD-10-CM | POA: Insufficient documentation

## 2014-01-09 DIAGNOSIS — F32A Depression, unspecified: Secondary | ICD-10-CM

## 2014-01-09 DIAGNOSIS — F329 Major depressive disorder, single episode, unspecified: Secondary | ICD-10-CM

## 2014-01-09 DIAGNOSIS — R45851 Suicidal ideations: Secondary | ICD-10-CM | POA: Insufficient documentation

## 2014-01-09 DIAGNOSIS — Z9114 Patient's other noncompliance with medication regimen: Secondary | ICD-10-CM

## 2014-01-09 DIAGNOSIS — F319 Bipolar disorder, unspecified: Secondary | ICD-10-CM | POA: Insufficient documentation

## 2014-01-09 DIAGNOSIS — Z3202 Encounter for pregnancy test, result negative: Secondary | ICD-10-CM | POA: Insufficient documentation

## 2014-01-09 DIAGNOSIS — F17200 Nicotine dependence, unspecified, uncomplicated: Secondary | ICD-10-CM | POA: Insufficient documentation

## 2014-01-09 DIAGNOSIS — Z8709 Personal history of other diseases of the respiratory system: Secondary | ICD-10-CM | POA: Insufficient documentation

## 2014-01-09 DIAGNOSIS — Z862 Personal history of diseases of the blood and blood-forming organs and certain disorders involving the immune mechanism: Secondary | ICD-10-CM | POA: Insufficient documentation

## 2014-01-09 DIAGNOSIS — Z79899 Other long term (current) drug therapy: Secondary | ICD-10-CM | POA: Insufficient documentation

## 2014-01-09 DIAGNOSIS — Z8639 Personal history of other endocrine, nutritional and metabolic disease: Secondary | ICD-10-CM | POA: Insufficient documentation

## 2014-01-09 DIAGNOSIS — Z9119 Patient's noncompliance with other medical treatment and regimen: Secondary | ICD-10-CM | POA: Insufficient documentation

## 2014-01-09 DIAGNOSIS — G4700 Insomnia, unspecified: Secondary | ICD-10-CM

## 2014-01-09 DIAGNOSIS — Z792 Long term (current) use of antibiotics: Secondary | ICD-10-CM | POA: Insufficient documentation

## 2014-01-09 DIAGNOSIS — Z87442 Personal history of urinary calculi: Secondary | ICD-10-CM | POA: Insufficient documentation

## 2014-01-09 DIAGNOSIS — Z87440 Personal history of urinary (tract) infections: Secondary | ICD-10-CM | POA: Insufficient documentation

## 2014-01-09 DIAGNOSIS — Z91199 Patient's noncompliance with other medical treatment and regimen due to unspecified reason: Secondary | ICD-10-CM | POA: Insufficient documentation

## 2014-01-09 LAB — MISCELLANEOUS TEST

## 2014-01-09 LAB — URINALYSIS, ROUTINE W REFLEX MICROSCOPIC
Glucose, UA: NEGATIVE mg/dL
HGB URINE DIPSTICK: NEGATIVE
KETONES UR: 15 mg/dL — AB
Leukocytes, UA: NEGATIVE
Nitrite: NEGATIVE
PROTEIN: NEGATIVE mg/dL
Specific Gravity, Urine: 1.021 (ref 1.005–1.030)
UROBILINOGEN UA: 0.2 mg/dL (ref 0.0–1.0)
pH: 6 (ref 5.0–8.0)

## 2014-01-09 LAB — RAPID URINE DRUG SCREEN, HOSP PERFORMED
Amphetamines: NOT DETECTED
Barbiturates: NOT DETECTED
Benzodiazepines: NOT DETECTED
Cocaine: NOT DETECTED
Opiates: NOT DETECTED
TETRAHYDROCANNABINOL: NOT DETECTED

## 2014-01-09 LAB — BASIC METABOLIC PANEL
BUN: 8 mg/dL (ref 6–23)
CALCIUM: 9.3 mg/dL (ref 8.4–10.5)
CO2: 25 meq/L (ref 19–32)
CREATININE: 0.6 mg/dL (ref 0.50–1.10)
Chloride: 103 mEq/L (ref 96–112)
GFR calc Af Amer: >90 mL/min (ref >90)
Glucose, Bld: 99 mg/dL (ref 70–99)
Potassium: 3.2 mEq/L — ABNORMAL LOW (ref 3.7–5.3)
Sodium: 143 mEq/L (ref 137–147)

## 2014-01-09 LAB — CBC
HCT: 41.7 % (ref 36.0–46.0)
Hemoglobin: 14.4 g/dL (ref 12.0–15.0)
MCH: 29.8 pg (ref 26.0–34.0)
MCHC: 34.5 g/dL (ref 30.0–36.0)
MCV: 86.2 fL (ref 78.0–100.0)
PLATELETS: 392 10*3/uL (ref 150–400)
RBC: 4.84 MIL/uL (ref 3.87–5.11)
RDW: 12.6 % (ref 11.5–15.5)
WBC: 7.2 10*3/uL (ref 4.0–10.5)

## 2014-01-09 LAB — ETHANOL

## 2014-01-09 LAB — POC URINE PREG, ED: PREG TEST UR: NEGATIVE

## 2014-01-09 MED ORDER — IBUPROFEN 200 MG PO TABS: 600.0000 mg | ORAL_TABLET | Freq: Three times a day (TID) | ORAL | Status: DC | PRN

## 2014-01-09 MED ORDER — PROCHLORPERAZINE MALEATE 10 MG PO TABS
10.0000 mg | ORAL_TABLET | Freq: Four times a day (QID) | ORAL | Status: DC | PRN
  Filled 2014-01-09: qty 1

## 2014-01-09 MED ORDER — LORAZEPAM 1 MG PO TABS
1.0000 mg | ORAL_TABLET | Freq: Three times a day (TID) | ORAL | Status: DC | PRN
  Administered 2014-01-10: 1 mg via ORAL
  Filled 2014-01-09: qty 1

## 2014-01-09 MED ORDER — ZOLPIDEM TARTRATE 5 MG PO TABS: 5.0000 mg | ORAL_TABLET | Freq: Every evening | ORAL | Status: DC | PRN

## 2014-01-09 MED ORDER — NICOTINE 21 MG/24HR TD PT24
21.0000 mg | MEDICATED_PATCH | Freq: Every day | TRANSDERMAL | Status: DC
  Administered 2014-01-09 – 2014-01-10 (×2): 21 mg via TRANSDERMAL
  Filled 2014-01-09 (×2): qty 1

## 2014-01-09 MED ORDER — DIPHENHYDRAMINE HCL 25 MG PO CAPS
25.0000 mg | ORAL_CAPSULE | Freq: Once | ORAL | Status: AC
  Administered 2014-01-09: 25 mg via ORAL
  Filled 2014-01-09: qty 1

## 2014-01-09 MED ORDER — KETOROLAC TROMETHAMINE 60 MG/2ML IM SOLN
60.0000 mg | Freq: Once | INTRAMUSCULAR | Status: AC
  Administered 2014-01-09: 60 mg via INTRAMUSCULAR
  Filled 2014-01-09: qty 2

## 2014-01-09 MED ORDER — ACETAMINOPHEN 325 MG PO TABS: 650.0000 mg | ORAL_TABLET | ORAL | Status: DC | PRN

## 2014-01-09 MED ORDER — ONDANSETRON HCL 4 MG PO TABS: 4.0000 mg | ORAL_TABLET | Freq: Three times a day (TID) | ORAL | Status: DC | PRN
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
## 2014-01-09 NOTE — ED Notes (Addendum)
Pt c/o insomnia, jitteriness, panic attacks, and suicidal thoughts w/o plan x 3 weeks and headache and nausea x 2 days.  Reports second anniversary of mother's death in soon and she hasn't been taking her medications.  Sts "I have thoughts like the world would be better w/o me."  Denies HI and hallucinations.
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
## 2014-01-10 ENCOUNTER — Inpatient Hospital Stay (HOSPITAL_COMMUNITY)
Admission: AD | Admit: 2014-01-10 | Discharge: 2014-01-14 | DRG: 885 | Disposition: A | Discharge status: Home / Self Care | Payer: Medicaid Other | Source: Intra-hospital | Attending: Psychiatry | Admitting: Psychiatry

## 2014-01-10 ENCOUNTER — Encounter (HOSPITAL_COMMUNITY): Payer: Self-pay | Admitting: General Practice

## 2014-01-10 DIAGNOSIS — K219 Gastro-esophageal reflux disease without esophagitis: Secondary | ICD-10-CM | POA: Diagnosis present

## 2014-01-10 DIAGNOSIS — I10 Essential (primary) hypertension: Secondary | ICD-10-CM | POA: Diagnosis present

## 2014-01-10 DIAGNOSIS — Z8249 Family history of ischemic heart disease and other diseases of the circulatory system: Secondary | ICD-10-CM

## 2014-01-10 DIAGNOSIS — G4700 Insomnia, unspecified: Secondary | ICD-10-CM | POA: Diagnosis present

## 2014-01-10 DIAGNOSIS — F332 Major depressive disorder, recurrent severe without psychotic features: Secondary | ICD-10-CM | POA: Diagnosis present

## 2014-01-10 DIAGNOSIS — F17200 Nicotine dependence, unspecified, uncomplicated: Secondary | ICD-10-CM | POA: Diagnosis present

## 2014-01-10 DIAGNOSIS — J45909 Unspecified asthma, uncomplicated: Secondary | ICD-10-CM | POA: Diagnosis present

## 2014-01-10 DIAGNOSIS — E785 Hyperlipidemia, unspecified: Secondary | ICD-10-CM | POA: Diagnosis present

## 2014-01-10 DIAGNOSIS — F410 Panic disorder [episodic paroxysmal anxiety] without agoraphobia: Secondary | ICD-10-CM | POA: Diagnosis present

## 2014-01-10 DIAGNOSIS — R45851 Suicidal ideations: Secondary | ICD-10-CM | POA: Diagnosis not present

## 2014-01-10 DIAGNOSIS — Z8041 Family history of malignant neoplasm of ovary: Secondary | ICD-10-CM

## 2014-01-10 DIAGNOSIS — F411 Generalized anxiety disorder: Secondary | ICD-10-CM | POA: Diagnosis present

## 2014-01-10 MED ORDER — POTASSIUM CHLORIDE CRYS ER 10 MEQ PO TBCR
20.0000 meq | EXTENDED_RELEASE_TABLET | Freq: Two times a day (BID) | ORAL | Status: AC
  Administered 2014-01-10 – 2014-01-13 (×6): 20 meq via ORAL
  Filled 2014-01-10: qty 1
  Filled 2014-01-10 (×2): qty 2
  Filled 2014-01-10: qty 1
  Filled 2014-01-10 (×5): qty 2

## 2014-01-10 MED ORDER — ACETAMINOPHEN 325 MG PO TABS: 650.0000 mg | ORAL_TABLET | Freq: Four times a day (QID) | ORAL | Status: DC | PRN

## 2014-01-10 MED ORDER — SERTRALINE HCL 50 MG PO TABS
50.0000 mg | ORAL_TABLET | Freq: Every day | ORAL | Status: DC
  Administered 2014-01-10 – 2014-01-12 (×3): 50 mg via ORAL
  Filled 2014-01-10 (×6): qty 1

## 2014-01-10 MED ORDER — ASPIRIN-ACETAMINOPHEN-CAFFEINE 250-250-65 MG PO TABS
2.0000 | ORAL_TABLET | Freq: Every day | ORAL | Status: DC | PRN
  Administered 2014-01-11 – 2014-01-12 (×2): 2 via ORAL
  Filled 2014-01-10 (×2): qty 2

## 2014-01-10 MED ORDER — ONDANSETRON 4 MG PO TBDP
4.0000 mg | ORAL_TABLET | Freq: Four times a day (QID) | ORAL | Status: DC | PRN
  Administered 2014-01-11: 4 mg via ORAL
  Filled 2014-01-10: qty 1

## 2014-01-10 MED ORDER — HYDROXYZINE HCL 25 MG PO TABS
25.0000 mg | ORAL_TABLET | Freq: Four times a day (QID) | ORAL | Status: DC | PRN
  Administered 2014-01-10 – 2014-01-12 (×5): 25 mg via ORAL
  Filled 2014-01-10 (×3): qty 1
  Filled 2014-01-10: qty 20
  Filled 2014-01-10 (×2): qty 1

## 2014-01-10 MED ORDER — PANTOPRAZOLE SODIUM 40 MG PO TBEC
80.0000 mg | DELAYED_RELEASE_TABLET | Freq: Every day | ORAL | Status: DC
  Administered 2014-01-10 – 2014-01-14 (×5): 80 mg via ORAL
  Filled 2014-01-10 (×9): qty 2

## 2014-01-10 MED ORDER — ALUM & MAG HYDROXIDE-SIMETH 200-200-20 MG/5ML PO SUSP: 30.0000 mL | ORAL | Status: DC | PRN

## 2014-01-10 MED ORDER — NICOTINE 21 MG/24HR TD PT24
21.0000 mg | MEDICATED_PATCH | Freq: Every day | TRANSDERMAL | Status: DC
  Administered 2014-01-11 – 2014-01-14 (×4): 21 mg via TRANSDERMAL
  Filled 2014-01-10 (×7): qty 1

## 2014-01-10 MED ORDER — MAGNESIUM HYDROXIDE 400 MG/5ML PO SUSP: 30.0000 mL | Freq: Every day | ORAL | Status: DC | PRN

## 2014-01-10 MED ORDER — TRAZODONE HCL 100 MG PO TABS
100.0000 mg | ORAL_TABLET | Freq: Every day | ORAL | Status: DC
  Administered 2014-01-10: 100 mg via ORAL
  Filled 2014-01-10 (×2): qty 2
  Filled 2014-01-10: qty 1

## 2014-01-10 MED ORDER — PROCHLORPERAZINE MALEATE 10 MG PO TABS
10.0000 mg | ORAL_TABLET | Freq: Four times a day (QID) | ORAL | Status: DC | PRN
  Administered 2014-01-10: 10 mg via ORAL
  Filled 2014-01-10 (×3): qty 1

## 2014-01-10 MED ORDER — ALBUTEROL SULFATE HFA 108 (90 BASE) MCG/ACT IN AERS: 2.0000 | INHALATION_SPRAY | RESPIRATORY_TRACT | Status: DC | PRN

## 2014-01-10 NOTE — Progress Notes (Signed)
Patient ID: [name], female   DOB: Dec 05, 1975, 38 y.o.   MRN: [id number]   [name] is a 38 year [facility] and and depression. Patient had a plan to OD. Patient denies SI/HI and A/V hallucinations currently. Patient reports high anxiety. Patient states that her mother passed away two years ago this month and that is one of the causes of depression. Patient was not bathing or taking her prescribed medications prior to admission. Patient has a PMH of GERD, Endometriosis, Kidney stones, and Bipolar disorder. Patient verbalized understanding of the admission process. Patient was oriented to the unit and Q15 minute safety checks were initiated and maintained. Patient is safe at this time.

## 2014-01-10 NOTE — Tx Team (Signed)
Initial Interdisciplinary Treatment Plan  PATIENT STRENGTHS: (choose at least two) Capable of independent living General fund of knowledge  PATIENT STRESSORS: Medication change or noncompliance   PROBLEM LIST: Problem List/Patient Goals Date to be addressed Date deferred Reason deferred Estimated date of resolution  Risk for Suicide 01/10/2014           Bipolar Disorder 01/10/2014                                          DISCHARGE CRITERIA:  Ability to meet basic life and health needs Safe-care adequate arrangements made  PRELIMINARY DISCHARGE PLAN: Outpatient therapy Return to previous living arrangement  PATIENT/FAMIILY INVOLVEMENT: This treatment plan has been presented to and reviewed with the patient, [name].  The patient and family have been given the opportunity to ask questions and make suggestions.  Marzetta BoardDopson, Christa E 01/10/2014, 5:32 PM

## 2014-01-10 NOTE — BHH Counselor (Signed)
[facility] pt [facility] to bed 503[facility]1. [facility]. [employer] placed in pt's chart.  Evette Cristalaroline Paige McLean, [place] Assessment Counselor

## 2014-01-10 NOTE — Consult Note (Signed)
[facility] Face-to-Face Psychiatry Consult   Reason for Consult:  Suicidal threats Referring Physician:  ER MD  [name] is an 38 y.o. female. Total Time spent with patient: 45 minutes  Assessment: AXIS I:  Bipolar, Depressed AXIS II:  Deferred AXIS III:   Past Medical History  Diagnosis Date  . Bronchitis     history of  . Headache(784.0)   . Depression   . Anxiety   . GERD (gastroesophageal reflux disease)     uses otc acid reducer  . Endometriosis   . Renal disorder     kidney stones  . Bipolar 1 disorder   . UTI (lower urinary tract infection)   . Cigarette smoker   . Chronic bronchitis   . Hyperlipidemia    AXIS IV:  housing problems, other psychosocial or environmental problems and problems with primary support group AXIS V:  41-50 serious symptoms  Plan:  Recommend psychiatric Inpatient admission when medically cleared.  Subjective:   [name] is a 38 y.o. female patient admitted with suicidal threats.  HPI:  [name] says she has been depressed for years.  She has not taken medications for 6 months because she was no longer able to see her PCP in [place].  She now lives in [place].  This is the anniversary of her mother's death 3 years ago and that has added to her depression.  She has no friends and no life and is feeling "hopeless".  She also has panic attacks that have gotten worse she says.She has 2 children who are being taken care of by someone else.  She last attempted suicide about 10 to 15 years ago.  She would overdose this time. HPI Elements:   Location:  depression. Quality:  suicidal thoughts. Severity:  has a plan. Timing:  not taking her medications and it is the anniversary of her mother's death. Duration:  many years. Context:  as above.  Past Psychiatric History: Past Medical History  Diagnosis Date  . Bronchitis     history of  . Headache(784.0)   . Depression   . Anxiety   . GERD (gastroesophageal reflux disease)    uses otc acid reducer  . Endometriosis   . Renal disorder     kidney stones  . Bipolar 1 disorder   . UTI (lower urinary tract infection)   . Cigarette smoker   . Chronic bronchitis   . Hyperlipidemia     reports that she has been smoking Cigarettes.  She has a 5 pack-year smoking history. She has never used smokeless tobacco. She reports that she does not drink alcohol or use illicit drugs. Family History  Problem Relation Age of Onset  . Heart attack Mother   . Heart attack Father   . Ovarian cancer Maternal Grandmother   . Kidney Stones Mother    Family History Substance Abuse: No Family Supports: No Living Arrangements: Alone Can pt return to current living arrangement?: Yes   Allergies:   Allergies  Allergen Reactions  . Risperidone And Related Anaphylaxis  . Tramadol Anaphylaxis  . Aripiprazole Other (See Comments)    Causes seizures    ACT Assessment Complete:  Yes:    Educational Status    Risk to Self: Risk to self Suicidal Ideation: Yes-Currently Present Suicidal Intent: Yes-Currently Present Is patient at risk for suicide?: Yes Suicidal Plan?: Yes-Currently Present Specify Current Suicidal Plan: overdose Access to Means: Yes Specify Access to Suicidal Means: personal medication What has been your use  of drugs/alcohol within the last 12 months?: none Previous Attempts/Gestures: Yes How many times?: 1 Other Self Harm Risks: hx of cutting behaviors Triggers for Past Attempts: Anniversary Intentional Self Injurious Behavior: None Recent stressful life event(s): Loss (Comment) Persecutory voices/beliefs?: No Depression: Yes Depression Symptoms: Isolating;Fatigue;Loss of interest in usual pleasures;Feeling angry/irritable;Feeling worthless/self pity Substance abuse history and/or treatment for substance abuse?: No  Risk to Others: Risk to Others Homicidal Ideation: No-Not Currently/Within Last 6 Months Thoughts of Harm to Others: No-Not Currently  Present/Within Last 6 Months Current Homicidal Plan: No-Not Currently/Within Last 6 Months Access to Homicidal Means: No History of harm to others?: No Assessment of Violence: None Noted Does patient have access to weapons?: No Criminal Charges Pending?: No Does patient have a court date: No  Abuse:    Prior Inpatient Therapy: Prior Inpatient Therapy Prior Inpatient Therapy: Yes Prior Therapy Facilty/Provider(s): [facility] Reason for Treatment: panic attack  Prior Outpatient Therapy: Prior Outpatient Therapy Prior Outpatient Therapy: Yes Prior Therapy Facilty/Provider(s): [facility], [facility],   Additional Information: Additional Information 1:1 In Past 12 Months?: No CIRT Risk: No Elopement Risk: No Does patient have medical clearance?: Yes                  Objective: Blood pressure 130/83, pulse 98, temperature 98.8 F (37.1 C), temperature source Oral, resp. rate 18, last menstrual period 01/09/2014, SpO2 100.00%.There is no weight on file to calculate BMI. Results for orders placed during the hospital encounter of 01/09/14 (from the past 72 hour(s))  URINALYSIS, ROUTINE W REFLEX MICROSCOPIC     Status: Abnormal   Collection Time    01/09/14  6:57 PM      Result Value Ref Range   Color, Urine AMBER (*) YELLOW   Comment: BIOCHEMICALS MAY BE AFFECTED BY COLOR   APPearance CLEAR  CLEAR   Specific Gravity, Urine 1.021  1.005 - 1.030   pH 6.0  5.0 - 8.0   Glucose, UA NEGATIVE  NEGATIVE mg/dL   Hgb urine dipstick NEGATIVE  NEGATIVE   Bilirubin Urine SMALL (*) NEGATIVE   Ketones, ur 15 (*) NEGATIVE mg/dL   Protein, ur NEGATIVE  NEGATIVE mg/dL   Urobilinogen, UA 0.2  0.0 - 1.0 mg/dL   Nitrite NEGATIVE  NEGATIVE   Leukocytes, UA NEGATIVE  NEGATIVE   Comment: MICROSCOPIC NOT DONE ON URINES WITH NEGATIVE PROTEIN, BLOOD, LEUKOCYTES, NITRITE, OR GLUCOSE <1000 mg/dL.  URINE RAPID DRUG SCREEN (HOSP PERFORMED)     Status: None   Collection Time     01/09/14  6:57 PM      Result Value Ref Range   Opiates NONE DETECTED  NONE DETECTED   Cocaine NONE DETECTED  NONE DETECTED   Benzodiazepines NONE DETECTED  NONE DETECTED   Amphetamines NONE DETECTED  NONE DETECTED   Tetrahydrocannabinol NONE DETECTED  NONE DETECTED   Barbiturates NONE DETECTED  NONE DETECTED   Comment:            DRUG SCREEN FOR MEDICAL PURPOSES     ONLY.  IF CONFIRMATION IS NEEDED     FOR ANY PURPOSE, NOTIFY LAB     WITHIN 5 DAYS.                LOWEST DETECTABLE LIMITS     FOR URINE DRUG SCREEN     Drug Class       Cutoff (ng/mL)     Amphetamine      1000     Barbiturate  200     Benzodiazepine   355     Tricyclics       732     Opiates          300     Cocaine          300     THC              50  POC URINE PREG, ED     Status: None   Collection Time    01/09/14  7:12 PM      Result Value Ref Range   Preg Test, Ur NEGATIVE  NEGATIVE   Comment:            THE SENSITIVITY OF THIS     METHODOLOGY IS >24 mIU/mL  CBC     Status: None   Collection Time    01/09/14  7:16 PM      Result Value Ref Range   WBC 7.2  4.0 - 10.5 K/uL   RBC 4.84  3.87 - 5.11 MIL/uL   Hemoglobin 14.4  12.0 - 15.0 g/dL   HCT 41.7  36.0 - 46.0 %   MCV 86.2  78.0 - 100.0 fL   MCH 29.8  26.0 - 34.0 pg   MCHC 34.5  30.0 - 36.0 g/dL   RDW 12.6  11.5 - 15.5 %   Platelets 392  150 - 400 K/uL  BASIC METABOLIC PANEL     Status: Abnormal   Collection Time    01/09/14  7:16 PM      Result Value Ref Range   Sodium 143  137 - 147 mEq/L   Potassium 3.2 (*) 3.7 - 5.3 mEq/L   Chloride 103  96 - 112 mEq/L   CO2 25  19 - 32 mEq/L   Glucose, Bld 99  70 - 99 mg/dL   BUN 8  6 - 23 mg/dL   Creatinine, Ser 0.60  0.50 - 1.10 mg/dL   Calcium 9.3  8.4 - 10.5 mg/dL   GFR calc non Af Amer >90  >90 mL/min   GFR calc Af Amer >90  >90 mL/min   Comment: (NOTE)     The eGFR has been calculated using the CKD EPI equation.     This calculation has not been validated in all clinical situations.      eGFR's persistently <90 mL/min signify possible Chronic Kidney     Disease.  ETHANOL     Status: None   Collection Time    01/09/14  7:16 PM      Result Value Ref Range   Alcohol, Ethyl (B) <11  0 - 11 mg/dL   Comment:            LOWEST DETECTABLE LIMIT FOR     SERUM ALCOHOL IS 11 mg/dL     FOR MEDICAL PURPOSES ONLY   Labs are reviewed and are pertinent for low potassium.  Current Facility-Administered Medications  Medication Dose Route Frequency Provider Last Rate Last Dose  . acetaminophen (TYLENOL) tablet 650 mg  650 mg Oral Q4H PRN Margarita Mail, PA-C      . ibuprofen (ADVIL,MOTRIN) tablet 600 mg  600 mg Oral Q8H PRN Margarita Mail, PA-C      . LORazepam (ATIVAN) tablet 1 mg  1 mg Oral Q8H PRN Margarita Mail, PA-C   1 mg at 01/10/14 1122  . nicotine (NICODERM CQ - dosed in mg/24 hours) patch 21 mg  21 mg Transdermal Daily Abigail  Harris, PA-C   21 mg at 01/10/14 1119  . ondansetron (ZOFRAN) tablet 4 mg  4 mg Oral Q8H PRN Margarita Mail, PA-C      . prochlorperazine (COMPAZINE) tablet 10 mg  10 mg Oral Q6H PRN Margarita Mail, PA-C      . zolpidem (AMBIEN) tablet 5 mg  5 mg Oral QHS PRN Margarita Mail, PA-C       Current Outpatient Prescriptions  Medication Sig Dispense Refill  . albuterol (PROVENTIL HFA;VENTOLIN HFA) 108 (90 BASE) MCG/ACT inhaler Inhale 2 puffs into the lungs every 4 (four) hours as needed for wheezing or shortness of breath. Ventolin 2 puff every 12 hours and proair 2 puffs every 4 hours      . aspirin-acetaminophen-caffeine (EXCEDRIN MIGRAINE) 250-250-65 MG per tablet Take 2 tablets by mouth daily as needed for headache.      . cyclobenzaprine (FLEXERIL) 5 MG tablet Take 2 tablets (10 mg total) by mouth at bedtime as needed for muscle spasms. For muscle spasms      . HYDROcodone-acetaminophen (NORCO/VICODIN) 5-325 MG per tablet Take 1 tablet by mouth every 6 (six) hours as needed (breakthrough pain).  20 tablet  0  . omeprazole (PRILOSEC) 40 MG capsule Take 40 mg  by mouth 2 (two) times daily.       . traZODone (DESYREL) 100 MG tablet Take 100-200 mg by mouth at bedtime.      [name] levofloxacin (LEVAQUIN) 250 MG tablet Take 1 tablet (250 mg total) by mouth daily.  5 tablet  0    Psychiatric Specialty Exam:     Blood pressure 130/83, pulse 98, temperature 98.8 F (37.1 C), temperature source Oral, resp. rate 18, last menstrual period 01/09/2014, SpO2 100.00%.There is no weight on file to calculate BMI.  General Appearance: Fairly Groomed  Engineer, water::  Good  Speech:  Clear and Coherent  Volume:  Normal  Mood:  Depressed  Affect:  Depressed  Thought Process:  Coherent  Orientation:  Full (Time, Place, and Person)  Thought Content:  Negative  Suicidal Thoughts:  Yes.  with intent/plan  Homicidal Thoughts:  No  Memory:  Immediate;   Good Recent;   Good Remote;   Good  Judgement:  Good  Insight:  Fair  Psychomotor Activity:  Normal  Concentration:  Good  Recall:  Good  Fund of Knowledge:Good  Language: Good  Akathisia:  Negative  Handed:  Right  AIMS (if indicated):     Assets:  Communication Skills Desire for Improvement Housing Physical Health  Sleep:      Musculoskeletal: Strength & Muscle Tone: within normal limits Gait & Station: normal Patient leans: N/A  Treatment Plan Summary: transfer to [facility] inpatient bed 507-2 for treatment of her depression and suicidal ideation  TAYLOR,GERALD D 01/10/2014 11:46 AM

## 2014-01-10 NOTE — ED Provider Notes (Signed)
Medical screening examination/treatment/procedure(s) were performed by non-physician practitioner and as supervising physician I was immediately available for consultation/collaboration.   EKG Interpretation None        Whitney Plunkett, MD 01/10/14 2351 

## 2014-01-10 NOTE — Progress Notes (Signed)
Patient did attend the evening karaoke group. Pt was engaged and supportive but did not participate by singing a song.    

## 2014-01-10 NOTE — Progress Notes (Signed)
D:[facility] and not for [facility]. Pt is requesting to process on the 500 hall. Pt c/o anxiety and nausea. Potassium level  3.2.  A:Reported to NP potassium level and requested medication for anxiety as pt was given prn compazine that helped with nausea and not anxiety. Gave medications as ordered. R:Pt denies si and hi. Safety maintained on the unit.

## 2014-01-10 NOTE — Plan of Care (Signed)
[facility] Crisis [facility]  Reason for Crisis Plan:  Chronic Mental Illness/Medical Illness, Crisis Stabilization and Medication Management   Plan of Care:  Referral for IOP Patient is not following up with outpatient referrals recommendations.  A   Family Support:    Patient reports that she does not have any supports at this time. In previous assessment she mentioned a boyfriend that relocated back to [place].    Current Living Environment:  Living Arrangements: Alone  Insurance:   Hospital Account   Name Acct ID Class Status Primary Coverage   Laureen OchsRiggs, Lasharn Marie [id number] Emergency Discharged/Not Billed MEDICAID [place] - MEDICAID [place] ACCESS        Guarantor Account (for Hospital Account [id number])   Name Relation to Pt Service Area Active? Acct Type   Laureen Ochsiggs, Ayan Marie Self CHSA Yes Personal/Family   Address Phone       [address] [place], [place] [address] [phone])          Coverage Information (for Hospital Account [id number])   F/O Payor/Plan Precert #   MEDICAID [place]/MEDICAID [place] ACCESS    Subscriber Subscriber #   Laureen OchsRiggs, Stefan Marie [id number]   Address Phone   [id number] [place], [place] [address] [phone]      Legal Guardian:    Self  Primary Care Provider:  Aida PufferLITTLE,JAMES, MD  Current Outpatient Providers:  None Patient is noncompliant with following up with referrals   Psychiatrist:  Name of Psychiatrist: none  Counselor/Therapist:    None at this time.    Compliant with Medications:  No  Additional Information: Patient returns to the Emergency Department for medication and refuses to follow up with aftercare recommendations.  Her symptoms depression/suicidality appears to be malingering. On 01/09/2014 patient was recommended once completed inpatient treatment to be referred for a community enhanced service such as [facility] Support [facility].      Olga CoasterREED, KRISTEN A 6/25/20152:17 PM

## 2014-01-11 ENCOUNTER — Encounter (HOSPITAL_COMMUNITY): Payer: Self-pay | Admitting: Psychiatry

## 2014-01-11 DIAGNOSIS — F410 Panic disorder [episodic paroxysmal anxiety] without agoraphobia: Secondary | ICD-10-CM | POA: Diagnosis present

## 2014-01-11 DIAGNOSIS — F4001 Agoraphobia with panic disorder: Secondary | ICD-10-CM | POA: Diagnosis present

## 2014-01-11 DIAGNOSIS — F411 Generalized anxiety disorder: Secondary | ICD-10-CM | POA: Diagnosis present

## 2014-01-11 MED ORDER — LAMOTRIGINE 25 MG PO TABS
25.0000 mg | ORAL_TABLET | Freq: Every day | ORAL | Status: DC
  Administered 2014-01-11 – 2014-01-14 (×4): 25 mg via ORAL
  Filled 2014-01-11 (×3): qty 1
  Filled 2014-01-11: qty 14
  Filled 2014-01-11 (×3): qty 1

## 2014-01-11 MED ORDER — ZOLPIDEM TARTRATE 10 MG PO TABS
10.0000 mg | ORAL_TABLET | Freq: Every evening | ORAL | Status: DC | PRN
  Administered 2014-01-11 – 2014-01-13 (×3): 10 mg via ORAL
  Filled 2014-01-11 (×3): qty 1

## 2014-01-11 NOTE — Progress Notes (Signed)
Assumed care of patient at 2300. Pt has rested in bed without difficulty. No acute distress, no complaints voiced. Level III obs in place for safety and pt is safe. Lawrence MarseillesFriedman, Marian Eakes

## 2014-01-11 NOTE — Tx Team (Signed)
Interdisciplinary Treatment Plan Update (Adult)  Date: 01/11/2014   Time Reviewed: 11:18 AM  Progress in Treatment:  Attending groups: No.  Participating in groups:  No.  Taking medication as prescribed: Yes  Tolerating medication: Yes  Family/Significant othe contact made: Not yet. SPE required for this pt.   Patient understands diagnosis: Yes, AEB seeking treatment for SI, mood stabilization, and med management.  Discussing patient identified problems/goals with staff: Yes  Medical problems stabilized or resolved: Yes  Denies suicidal/homicidal ideation: Yes during group/self report.  Patient has not harmed self or Others: Yes  New problem(s) identified:  Discharge Plan or Barriers: Pt plans to return home and follow up with [facility]. She stated that she needs help with Medicaid transport. Additional comments: The patient is a 38 y.o. year-old female with history of migraines, anxiety, depression, bipolar disorder who presents with flank pain. The patient was last at their baseline health about a week ago. She [facility]15 through 6/16 with migraine, UTI, and hypotension. Her urinalysis at that time had smal LE, 7-10 WBC, many bacteria and she was started on levofloxacin. Her blood pressure improved with IVF and she was discharged with levofloxacin. She returns after she continued to have dysuria and worsening flank pain. She describes a sharp lower right flank pain 8/10 that lasts for approximately 2 minutes and then resolved completely. It comes and goes. She also states she has had some lower extremity edema and some lightheadedness when she is sitting or standing up. She states that she has been eating and drinking normally but eating more often than usual.  Reason for Continuation of Hospitalization: Mood stabilization Med management  Estimated length of stay: 3-5 days  For review of initial/current patient goals, please see plan of care.  Attendees:  Patient:     Family:    Physician: Geoffery LyonsIrving Lugo MD 01/11/2014 11:18 AM   Nursing: Cicero DuckErika RN 01/11/2014 11:18 AM   Clinical Social Worker [employer] Sherwin-WilliamsHeather Smart, LCSWA  01/11/2014 11:18 AM   Other: Meryl DareJennifer P. RN 01/11/2014 11:18 AM   Other:    Other: Massie Kluverelores Sutton, Community Care Coordinator  01/11/2014 11:18 AM   Other:    Scribe for Treatment Team:  The Sherwin-WilliamsHeather Smart LCSWA 01/11/2014 11:18 AM

## 2014-01-11 NOTE — [facility] Group Notes (Signed)
[facility] LCSW Group Therapy  01/11/2014 3:07 PM  Type of Therapy:  Group Therapy  Participation Level:  Active  Participation Quality:  Attentive  Affect:  Flat  Cognitive:  Oriented  Insight:  Improving  Engagement in Therapy:  Improving  Modes of Intervention:  Confrontation, Discussion, Education, Exploration, Problem-solving, Rapport Building, Socialization and Support  Summary of Progress/Problems: Feelings around Relapse. Group members discussed the meaning of relapse and shared personal stories of relapse, how it affected them and others, and how they perceived themselves during this time. Group members were encouraged to identify triggers, warning signs and coping skills used when facing the possibility of relapse. Social supports were discussed and explored in detail. Post Acute Withdrawal Syndrome (handout provided) was introduced and examined. Pt's were encouraged to ask questions, talk about key points associated with PAWS, and process this information in terms of relapse prevention. [name] was attentive and engaged throughout today's therapy group and was able to contribute to group discussion about PAWS symptoms. [name] shows progress in the group setting and improving insight AEB her ability to process how relapse can mean more than substance abuse relapse. She actively listened as CSW and other group members discussed various types of relapse including mental health relapse, signs, symptoms, and prevention tips.    Smart, Terez Freimark LCSWA  01/11/2014, 3:07 PM

## 2014-01-11 NOTE — [facility] Suicide Risk Assessment (Signed)
[facility] INPATIENT:  Family/Significant Other Suicide Prevention Education  Suicide Prevention Education:  Patient Refusal for Family/Significant Other Suicide Prevention Education: The patient [name] has refused to provide written consent for family/significant other to be provided Family/Significant Other Suicide Prevention Education during admission and/or prior to discharge.  Physician notified.  SPE completed with pt. SPI pamphlet provided to pt and she was encouraged to share information with support network, ask questions, and talk about any concerns relating to SPE.  Smart, Heather LCSWA  01/11/2014, 2:41 PM

## 2014-01-11 NOTE — [facility] Counselor (Signed)
Adult Comprehensive Assessment  Patient ID: Audrey OchsBrandi Marie Folkert, female   DOB: [birth year], 38 y.o.   MRN: [id number]  Information Source: Information source: Patient  Current Stressors:  Employment / Job issues: unemployed, applied for Web designerdisability Financial / Lack of resources (include bankruptcy): no income, dependent on ex husband Bereavement / Loss: anniversary this month of mom passing away 2 years ago  Living/Environment/Situation:  Living Arrangements: Alone Living conditions (as described by patient or guardian): Pt lives alone in [place].  Pt reports this is a good environment.  How long has patient lived in current situation?: 6 months What is atmosphere in current home: Comfortable  Family History:  Marital status: Separated Separated, when?: 7 years What types of issues is patient dealing with in the relationship?: pt reports that they didn't get along Additional relationship information: N/A Does patient have children?: Yes How many children?: 2 How is patient's relationship with their children?: Pt reports having joint custody of 679 and 37 yr old children.  Pt reports having a good relationship with them.    Childhood History:  By whom was/is the patient raised?: Mother Additional childhood history information: Pt reports having a good childhood.  Description of patient's relationship with caregiver when they were a child: Pt reports getting along well with mom growing up.  Patient's description of current relationship with people who raised him/her: Mother is deceased.   Does patient have siblings?: Yes Number of Siblings: 1 Description of patient's current relationship with siblings: Estranged relationship with sister Did patient suffer any verbal/emotional/physical/sexual abuse as a child?: No Did patient suffer from severe childhood neglect?: No Has patient ever been sexually abused/assaulted/raped as an adolescent or adult?: No Was the patient ever a victim of a  crime or a disaster?: No Witnessed domestic violence?: No Has patient been effected by domestic violence as an adult?: No  Education:  Highest grade of school patient has completed: graduated high school Currently a Consulting civil engineerstudent?: No Learning disability?: No  Employment/Work Situation:   Employment situation: Unemployed (applied for disability) Patient's job has been impacted by current illness: No What is the longest time patient has a held a job?: 2 years  Where was the patient employed at that time?: Production designer, 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 Has patient ever been in the Eli Lilly and Companymilitary?: No Has patient ever served in Buyer, retailcombat?: No  Financial Resources:   Surveyor, quantityinancial resources: Sales executiveood stamps;Income from spouse Does patient have a representative payee or guardian?: No  Alcohol/Substance Abuse:   What has been your use of drugs/alcohol within the last 12 months?: Pt denies alcohol and drug abuse If attempted suicide, did drugs/alcohol play a role in this?: No Alcohol/Substance Abuse Treatment Hx: Past Tx, Inpatient If yes, describe treatment: 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, 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 Has alcohol/substance abuse ever caused legal problems?: Yes (DUI 6 years ago)  Social Support System:   Patient's Community Support System: Fair Museum/gallery exhibitions officerDescribe Community Support System: Pt reports children are her only support Type of faith/religion: [employer] How does patient's faith help to cope with current illness?: prayer, church attendance  Leisure/Recreation:   Leisure and Hobbies: reading and crafts  Strengths/Needs:   What things does the patient do well?: listening to people In what areas does patient struggle / problems for patient: Depression, SI  Discharge Plan:   Does patient have access to transportation?: Yes Will patient be returning to same living situation after discharge?: Yes Currently receiving community mental health services: Yes (From  Whom) ([facility]) If no, would patient like referral for services when discharged?: Yes (What county?) [facility] Does patient have financial barriers related to discharge medications?: No  Summary/Recommendations:     Patient is a 38 year old Caucasian Female with a diagnosis of Bipolar, Depressed.  Patient lives in [place] alone.  Pt reports being off of her meds and feeling depressed and suicidal.  Pt states that her only stressor right now is this month is the anniversary of the death of her mother 2 years ago.  Patient will benefit from crisis stabilization, medication evaluation, group therapy and psycho education in addition to case management for discharge planning.    Horton, Salome Arnthelsea Nicole. 01/11/2014

## 2014-01-11 NOTE — Progress Notes (Signed)
D:Pt is interacting appropriately with staff and peers. Pt continues to c/o anxiety and requests medication for sleep. A:Medication given as ordered. Offered support and 15 minute checks.  R:Pt denies si and hi. Safety maintained on the unit.
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
## 2014-01-11 NOTE — [facility] Group Notes (Signed)
[facility]   01/11/2014 11:17 AM  Participation Quality:  DID NOT ATTEND-pt sleeping   Smart, Caliope Ruppert LCSWA

## 2014-01-11 NOTE — [facility] Suicide Risk Assessment (Signed)
Suicide Risk Assessment  Admission Assessment     Nursing information obtained from:  Patient Demographic factors:  Caucasian;Living alone Current Mental Status:  NA Loss Factors:  Loss of significant relationship Historical Factors:  Family history of mental illness or substance abuse Risk Reduction Factors:  Religious beliefs about death Total Time spent with patient: 45 minutes  CLINICAL FACTORS:   Depression:   Impulsivity Insomnia  PsCOGNITIVE FEATURES THAT CONTRIBUTE TO RISK:  Closed-mindedness Polarized thinking Thought constriction (tunnel vision)    SUICIDE RISK:   Moderate:  Frequent suicidal ideation with limited intensity, and duration, some specificity in terms of plans, no associated intent, good self-control, limited dysphoria/symptomatology, some risk factors present, and identifiable protective factors, including available and accessible social support.  PLAN OF CARE: Supportive approach/coping skills/relapse prevention                               Optimize response to psychotropics  I certify that inpatient services furnished can reasonably be expected to improve the patient's condition.  LUGO,IRVING A 01/11/2014, 4:01 PM

## 2014-01-11 NOTE — Progress Notes (Signed)
D: Patient denies SI/HI or AVH.  She is flat and depressed and complained of a headache this morning which she reports as normal for her during her menses.  Pt. Spent some of the morning in bed but has been up in the afternoon attending and participating her groups.    A: Patient given emotional support from RN. Patient encouraged to come to staff with concerns and/or questions. Patient's medication routine continued. Patient's orders and plan of care reviewed.   R: Patient remains appropriate and cooperative. Will continue to monitor patient q15 minutes for safety.

## 2014-01-12 MED ORDER — SERTRALINE HCL 100 MG PO TABS
100.0000 mg | ORAL_TABLET | Freq: Every day | ORAL | Status: DC
  Administered 2014-01-13: 100 mg via ORAL
  Filled 2014-01-12: qty 1
  Filled 2014-01-12: qty 14
  Filled 2014-01-12: qty 1

## 2014-01-12 MED ORDER — PROPRANOLOL HCL 10 MG PO TABS
10.0000 mg | ORAL_TABLET | Freq: Two times a day (BID) | ORAL | Status: DC
  Administered 2014-01-12 – 2014-01-13 (×2): 10 mg via ORAL
  Filled 2014-01-12 (×4): qty 1

## 2014-01-12 NOTE — Progress Notes (Signed)
Spoke with pt 1:1 this evening. Pt forwards little information. Complaining of anxiety but not to the point of panic. Requested vistaril and Remus Lofflerambien which were given along with support and encouragement. Pt did not attend group tonight as she states AA does not address the reason she is here. Pt is pleasant and cooperative though remains flat, anxious and depressed. Denies SI/HI and remains safe at this time. Lawrence MarseillesFriedman, Marian Eakes

## 2014-01-12 NOTE — Progress Notes (Signed)
Patient did not attend the evening speaker AA meeting. Pt was notified that group was beginning but remained in her bed reading.

## 2014-01-12 NOTE — Progress Notes (Signed)
[facility]  01/12/2014 4:38 PM [name]  MRN:  [id number] Subjective:  [name] states she is very concerned about her panic attacks. States that she had them for years and that eventually they went away, but now they are back. States she wakes up in the morning with the anxiey, the flushed face, the fear. She has couple of them during the day. She was taking Zoloft before and it help the depression, but not the panic. She has had numerous medications trial but none effective. Concerned about having to go trough the misery she went trough before Diagnosis:   DSM5: Schizophrenia Disorders:  none Obsessive-Compulsive Disorders:  none Trauma-Stressor Disorders:  none Substance/Addictive Disorders:  History Depressive Disorders:  Major Depressive Disorder - Moderate (296.22) Total Time spent with patient: 30 minutes  Axis I: Panic Disorder  ADL's:  Intact  Sleep: Fair  Appetite:  Fair  Suicidal Ideation:  Plan:  denies Intent:  denies Means:  denies Homicidal Ideation:  Plan:  denies Intent:  denies Means:  denies AEB (as evidenced by):  Psychiatric Specialty Exam: Physical Exam  Review of Systems  Constitutional: Positive for malaise/fatigue.  HENT: Negative.   Eyes: Negative.   Respiratory: Negative.   Cardiovascular: Negative.   Gastrointestinal: Negative.   Genitourinary: Negative.   Musculoskeletal: Negative.   Skin: Negative.   Neurological: Positive for weakness.  Endo/Heme/Allergies: Negative.   Psychiatric/Behavioral: Positive for depression. The patient is nervous/anxious.     Blood pressure 147/91, pulse 96, temperature 99 F (37.2 C), temperature source Oral, resp. rate 18, height 5\' 3"  (1.6 m), weight 87.091 kg (192 lb), last menstrual period 01/09/2014.Body mass index is 34.02 kg/(m^2).  General Appearance: Fairly Groomed  Patent attorneyye Contact::  Fair  Speech:  Clear and Coherent and not spontaneous  Volume:  Decreased  Mood:  Anxious, Depressed  and worried  Affect:  anxious, worried  Thought Process:  Coherent and Goal Directed  Orientation:  Full (Time, Place, and Person)  Thought Content:  symtpoms, worries, concerns  Suicidal Thoughts:  No  Homicidal Thoughts:  No  Memory:  Immediate;   Fair Recent;   Fair Remote;   Fair  Judgement:  Fair  Insight:  Present  Psychomotor Activity:  Restlessness  Concentration:  Fair  Recall:  [employer]  Fund of Knowledge:NA  Language: Fair  Akathisia:  No  Handed:    AIMS (if indicated):     Assets:  Financial Resources/Insurance  Sleep:  Number of Hours: 6   Musculoskeletal: Strength & Muscle Tone: within normal limits Gait & Station: normal Patient leans: N/A  Current Medications: Current Facility-Administered Medications  Medication Dose Route Frequency [name] Last Rate Last Dose  . acetaminophen (TYLENOL) tablet 650 mg  650 mg Oral Q6H PRN Benjaman PottGerald D Taylor, MD      . albuterol (PROVENTIL HFA;VENTOLIN HFA) 108 (90 BASE) MCG/ACT inhaler 2 puff  2 puff Inhalation Q4H PRN Sanjuana KavaAgnes I Nwoko, NP      . alum & mag hydroxide-simeth (MAALOX/MYLANTA) 200-200-20 MG/5ML suspension 30 mL  30 mL Oral Q4H PRN Benjaman PottGerald D Taylor, MD      . aspirin-acetaminophen-caffeine [facility](EXCEDRIN MIGRAINE) per tablet 2 tablet  2 tablet Oral Daily PRN Sanjuana KavaAgnes I Nwoko, NP   2 tablet at 01/12/14 1305  . hydrOXYzine (ATARAX/VISTARIL) tablet 25 mg  25 mg Oral Q6H PRN Kristeen MansFran E Hobson, NP   25 mg at 01/12/14 1115  . lamoTRIgine (LAMICTAL) tablet 25 mg  25 mg Oral Daily Rachael FeeIrving A Lugo, MD  25 mg at 01/12/14 0834  . magnesium hydroxide (MILK OF MAGNESIA) suspension 30 mL  30 mL Oral Daily PRN Benjaman PottGerald D Taylor, MD      . nicotine (NICODERM CQ - dosed in mg/24 hours) patch 21 mg  21 mg Transdermal Daily Rachael FeeIrving A Lugo, MD   21 mg at 01/12/14 0835  . ondansetron (ZOFRAN-ODT) disintegrating tablet 4 mg  4 mg Oral Q6H PRN Nehemiah MassedFernando Cobos, MD   4 mg at 01/11/14 1315  . pantoprazole (PROTONIX) EC tablet 80 mg  80 mg Oral Daily Sanjuana KavaAgnes I Nwoko,  NP   80 mg at 01/12/14 0834  . potassium chloride (K-DUR,KLOR-CON) CR tablet 20 mEq  20 mEq Oral BID Sanjuana KavaAgnes I Nwoko, NP   20 mEq at 01/12/14 0834  . prochlorperazine (COMPAZINE) tablet 10 mg  10 mg Oral Q6H PRN Benjaman PottGerald D Taylor, MD   10 mg at 01/10/14 1917  . propranolol (INDERAL) tablet 10 mg  10 mg Oral BID Rachael FeeIrving A Lugo, MD      . Melene Muller[START ON 01/13/2014] sertraline (ZOLOFT) tablet 100 mg  100 mg Oral QHS Rachael FeeIrving A Lugo, MD      . zolpidem [facility](AMBIEN) tablet 10 mg  10 mg Oral QHS PRN Rachael FeeIrving A Lugo, MD   10 mg at 01/11/14 2202    Lab Results: No results found for this or any previous visit (from the past 48 hour(s)).  Physical Findings: AIMS: Facial and Oral Movements Muscles of Facial Expression: None, normal Lips and Perioral Area: None, normal Jaw: None, normal Tongue: None, normal,Extremity Movements Upper (arms, wrists, hands, fingers): None, normal Lower (legs, knees, ankles, toes): None, normal, Trunk Movements Neck, shoulders, hips: None, normal, Overall Severity Severity of abnormal movements (highest score from questions above): None, normal Incapacitation due to abnormal movements: None, normal Patient's awareness of abnormal movements (rate only patient's report): No Awareness, Dental Status Current problems with teeth and/or dentures?: No Does patient usually wear dentures?: No  CIWA:    COWS:     Treatment Plan Summary: Daily contact with patient to assess and evaluate symptoms and progress in treatment Medication management  Plan: Supportive approach/coping skills/relapse prevention           Increase the Zoloft to previous dose of 100 mg at HS           Trial with Inderal 10 mg BID  Medical Decision Making Problem Points:  Established problem, worsening (2) and Review of psycho-social stressors (1) Data Points:  Review of medication regiment & side effects (2) Review of new medications or change in dosage (2)  I certify that inpatient services furnished can reasonably  be expected to improve the patient's condition.   LUGO,IRVING A 01/12/2014, 4:38 PM

## 2014-01-12 NOTE — [facility] Group Notes (Signed)
[facility] Group Notes:  (Nursing/MHT/Case Management/Adjunct)  Date:  01/12/2014  Time:  3:39 PM  Type of Therapy:  Psychoeducational Skills  Participation Level:  Did Not Attend [name] 01/12/2014, 3:39 PM 

## 2014-01-12 NOTE — [facility] Group Notes (Signed)
[facility] Group Notes:  (Clinical Social Work)  01/12/2014     10-11AM  Summary of Progress/Problems:   The main focus of today's process group was for the patient to identify ways in which they have in the past sabotaged their own recovery. Motivational Interviewing and a worksheet were utilized to help patients explore in depth the perceived benefits and costs of their substance use, as well as the potential benefits and costs of stopping.  The Stages of Change were explained using a handout, with an emphasis on making plans to deal with sabotaging behaviors proactively.  The patient expressed that her self-sabotaging behavior is extremely negative self-talk, saying she has always had problems with her self-esteem.  She was insightful, saying that if she talks negatively about herself, then it lowers expectations, and she then will not disappoint herself or others.  It also does not hurt as bad when they come at her with negative statements, because she has already said them to herself.  She was called out to see the doctor.  Type of Therapy:  Group Therapy - Process   Participation Level:  Minimal  She was reading a book in between being asked questions  Participation Quality:  Sharing  Affect:  Blunted and Irritable  Cognitive:  Oriented  Insight:  Developing/Improving  Engagement in Therapy:  Improving  Modes of Intervention:  Education, Support and Processing, Motivational Interviewing  Ambrose MantleMareida Grossman-Orr, LCSW 01/12/2014, 12:46 PM

## 2014-01-12 NOTE — Progress Notes (Signed)
D:Pt rates depression and hopelessness as a 7 on 1-10 scale with 10 being the most. Pt reports that she has anxiety attacks daily since she stopped taking her medication. She rated anxiety earlier today as a 7 on 1-10 scale. Pt is sitting in the dayroom interacting with her peers.  A:Pt has been given medication for headache and anxiety today. Offered support and 15 minute checks. R:Pt denies si and hi. Safety maintained on the unit.

## 2014-01-13 MED ORDER — PROPRANOLOL HCL 10 MG PO TABS
20.0000 mg | ORAL_TABLET | Freq: Two times a day (BID) | ORAL | Status: DC
  Administered 2014-01-13 – 2014-01-14 (×2): 20 mg via ORAL
  Filled 2014-01-13: qty 1
  Filled 2014-01-13: qty 56
  Filled 2014-01-13 (×2): qty 1
  Filled 2014-01-13: qty 56
  Filled 2014-01-13: qty 1

## 2014-01-13 MED ORDER — CLONAZEPAM 1 MG PO TABS
1.0000 mg | ORAL_TABLET | Freq: Two times a day (BID) | ORAL | Status: DC
  Administered 2014-01-13: 1 mg via ORAL
  Filled 2014-01-13: qty 1

## 2014-01-13 MED ORDER — CLONAZEPAM 1 MG PO TABS
1.0000 mg | ORAL_TABLET | Freq: Two times a day (BID) | ORAL | Status: DC
  Administered 2014-01-13 – 2014-01-14 (×2): 1 mg via ORAL
  Filled 2014-01-13 (×2): qty 1

## 2014-01-13 MED ORDER — CLONAZEPAM 1 MG PO TABS: 1.0000 mg | ORAL_TABLET | Freq: Two times a day (BID) | ORAL | Status: DC

## 2014-01-13 NOTE — Progress Notes (Signed)
Adult Psychoeducational Group Note  Date:  01/13/2014 Time:  3:15PM  Group Topic/Focus:  Personal Choices and Values:   The focus of this group is to help patients assess and explore the importance of values in their lives, how their values affect their decisions, how they express their values and what opposes their expression.  Participation Level:  Did Not Attend  Additional Comments:  Pt did not attend group. Pt was in the bed asleep during group time  LEA, JANAY K 01/13/2014, 4:10 PM

## 2014-01-13 NOTE — [facility] Group Notes (Signed)
[facility] Group Notes: (Clinical Social Work)   01/13/2014      Type of Therapy:  Group Therapy   Participation Level:  Did Not Attend - Pt was in in group on [address], where she is staying, but was reading a book during group just as she did yesterday.  When asked about this, she stated that she does not have substance abuse issues, so does not feel she gets anything out of this group.  CSW suggested she should go to [address] and she responded she had been told she could not go to that group.  She did not mention that apparently her boyfriend is on that hall.  CSW did send her to that hall to program, and was later informed by RN that this was not appropriate.   Ambrose MantleMareida Grossman-Orr, LCSW 01/13/2014, 12:50 PM
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
## 2014-01-13 NOTE — Plan of Care (Signed)
Problem: Diagnosis: Increased Risk For Suicide Attempt Goal: STG-Patient Will Attend All Groups On The Unit Outcome: Not Progressing Pt unable to program on the appropriate hall (500) due to being admitted with significant other. Patient opts to read quietly in room rather than attend any [address] groups.

## 2014-01-13 NOTE — [facility] Group Notes (Signed)
[facility] Group Notes:  (Nursing/MHT/Case Management/Adjunct)  Date:  01/13/2014  Time:  2:21 PM  Type of Therapy:  Psychoeducational Skills  Participation Level:  Active  Participation Quality:  Appropriate  Affect:  Appropriate  Cognitive:  Appropriate  Insight:  Appropriate  Engagement in Group:  Engaged  Modes of Intervention:  Discussion  Summary of Progress/Problems: Pt did attend healthy supports systems group, pt reported that her boyfriend is her support system. Pt also reported that she needed to be discharged tomorrow because she has a court date on Tuesday at 8.       Jacquelyne BalintForrest, Shalita Shanta 01/13/2014, 2:21 PM

## 2014-01-13 NOTE — Progress Notes (Signed)
Patient did not attend the evening speaker AA meeting. Pt was notified that group was beginning but remained in her room reading.

## 2014-01-13 NOTE — Plan of Care (Signed)
Problem: Alteration in mood; excessive anxiety as evidenced by: Goal: STG-Pt will report an absence of self-harm thoughts/actions (Patient will report an absence of self-harm thoughts or actions)  Outcome: Completed/Met Date Met:  01/13/14 Patient has not voiced any SI nor has she engaged in any self harm while on unit.      

## 2014-01-13 NOTE — Progress Notes (Signed)
Pt reading quietly in room during group. She is flat, anxious and depressed though reports improvement in symptoms and mood. States she feels ready for discharge tomorrow. Support and encouragement given. Med education provided regarding zoloft and klonopin. Pt denies SI/HI and remains safe at this time. Will medicate with ambien per her request closer to hs. Lawrence MarseillesFriedman, Marian Eakes

## 2014-01-13 NOTE — Plan of Care (Signed)
Problem: Alteration in mood; excessive anxiety as evidenced by: Goal: STG-Pt can identify coping skills to manage panic/anxiety (Patient can identify at least ____ coping skills to manage panic/anxiety attack)  Outcome: Completed/Met Date Met:  01/13/14 Pt states relying on support system, staying med compliant, reading and taking care of self will increase her efforts to manage panic and anxiety.

## 2014-01-13 NOTE — [facility] Group Notes (Signed)
[facility] Group Notes:  (Nursing/MHT/Case Management/Adjunct)  Date:  01/13/2014  Time:  1:03 PM  Type of Therapy:  Psychoeducational Skills  Participation Level:  Active  Participation Quality:  Appropriate  Affect:  Appropriate  Cognitive:  Appropriate  Insight:  Appropriate  Engagement in Group:  Engaged  Modes of Intervention:  Discussion  Summary of Progress/Problems: Pt did attend self inventory group, pt reported that she was negative SI/HI, no AH/VH noted. Pt rated her depression as a 3, and her helplessness/hopelessness as a 3.     Pt reported concerns about severe anxiety and panic attacks, pt advised that the doctor will be made aware.    Jacquelyne BalintForrest, Shalita Shanta 01/13/2014, 1:03 PM

## 2014-01-13 NOTE — Progress Notes (Signed)
Pt has been up for groups and interacting with staff appropriately. She rated both her depression and hopelessness a 5 and her anxiety a 5 on her self-inventory.  She denies any S/H ideation or A/V/H. She has complianed that her medications or not working for her anxiety and she talked to Dr. Dub MikesLugo and he did order her klonopin 1 mg bid. During my first assessment this morning.  Pt was lying in her bed when this nurse spoke with her about her attending groups.  Asked pt if she was not here for detox then why was she on this unit.  She stated,"I don't know why" asked her did she know someone on the other unit ([address]) and she replied,"yes, maybe that is why" she was taken to the 500 group by a case manager who didn't know she was not able to program on the 500. We discuss before lunch and she voiced understanding that neither one of them could get benefit from being here d/t neither one of them may feel comfortable sharing in groups. She did voice understanding.  The patient's name on the [address] hall is Demetrius RevelJoseph F.  She believes she will follow up at [facility].

## 2014-01-14 DIAGNOSIS — F332 Major depressive disorder, recurrent severe without psychotic features: Principal | ICD-10-CM

## 2014-01-14 DIAGNOSIS — F411 Generalized anxiety disorder: Secondary | ICD-10-CM

## 2014-01-14 MED ORDER — CLONAZEPAM 1 MG PO TABS: 1.0000 mg | ORAL_TABLET | Freq: Two times a day (BID) | ORAL | Status: DC

## 2014-01-14 MED ORDER — ASPIRIN-ACETAMINOPHEN-CAFFEINE 250-250-65 MG PO TABS: 2.0000 | ORAL_TABLET | Freq: Every day | ORAL | Status: DC | PRN

## 2014-01-14 MED ORDER — HYDROXYZINE HCL 25 MG PO TABS: 25.0000 mg | ORAL_TABLET | Freq: Four times a day (QID) | ORAL | Status: DC | PRN

## 2014-01-14 MED ORDER — PROPRANOLOL HCL 20 MG PO TABS: 20.0000 mg | ORAL_TABLET | Freq: Two times a day (BID) | ORAL | Status: DC

## 2014-01-14 MED ORDER — SERTRALINE HCL 100 MG PO TABS: 100.0000 mg | ORAL_TABLET | Freq: Every day | ORAL | Status: DC

## 2014-01-14 MED ORDER — LAMOTRIGINE 25 MG PO TABS: 25.0000 mg | ORAL_TABLET | Freq: Every day | ORAL | Status: DC

## 2014-01-14 MED ORDER — ALBUTEROL SULFATE HFA 108 (90 BASE) MCG/ACT IN AERS: 2.0000 | INHALATION_SPRAY | RESPIRATORY_TRACT | Status: DC | PRN

## 2014-01-14 MED ORDER — ZOLPIDEM TARTRATE 10 MG PO TABS: 10.0000 mg | ORAL_TABLET | Freq: Every evening | ORAL | Status: DC | PRN

## 2014-01-14 MED ORDER — OMEPRAZOLE 40 MG PO CPDR: 40.0000 mg | DELAYED_RELEASE_CAPSULE | Freq: Two times a day (BID) | ORAL | Status: DC

## 2014-01-14 NOTE — [facility] Suicide Risk Assessment (Signed)
Suicide Risk Assessment  Discharge Assessment     Demographic Factors:  Caucasian and Living alone  Total Time spent with patient: 45 minutes  Psychiatric Specialty Exam:     Blood pressure 137/89, pulse 81, temperature 98.3 F (36.8 C), temperature source Oral, resp. rate 16, height 5\' 3"  (1.6 m), weight 87.091 kg (192 lb), last menstrual period 01/09/2014, SpO2 100.00%.Body mass index is 34.02 kg/(m^2).  General Appearance: Fairly Groomed  Patent attorneyye Contact::  Fair  Speech:  Clear and Coherent  Volume:  Normal  Mood:  Anxious and worried  Affect:  Appropriate  Thought Process:  Coherent and Goal Directed  Orientation:  Full (Time, Place, and Person)  Thought Content:  plans as she moves on  Suicidal Thoughts:  No  Homicidal Thoughts:  No  Memory:  Immediate;   Fair Recent;   Fair Remote;   Fair  Judgement:  Fair  Insight:  Present  Psychomotor Activity:  Normal  Concentration:  Fair  Recall:  FiservFair  Fund of Knowledge:NA  Language: Fair  Akathisia:  No  Handed:  Right  AIMS (if indicated):     Assets:  Desire for Improvement Housing Social Support  Sleep:  Number of Hours: 5.75    Musculoskeletal: Strength & Muscle Tone: within normal limits Gait & Station: normal Patient leans: N/A   Mental Status Per Nursing Assessment::   On Admission:  NA  Current Mental Status by Physician: In full contact with reality. There are no active SI plans or intent. Mood is " better" " I am optimistic" affect appropriate She is willing and motivated to pursue outpatient follow up   Loss Factors: Legal issues and Financial problems/change in socioeconomic status  Historical Factors: NA  Risk Reduction Factors:   Sense of responsibility to family and Positive social support  Continued Clinical Symptoms:  Depression:   Severe  Cognitive Features That Contribute To Risk:  Closed-mindedness Polarized thinking Thought constriction (tunnel vision)    Suicide Risk:  Minimal:  No identifiable suicidal ideation.  Patients presenting with no risk factors but with morbid ruminations; may be classified as minimal risk based on the severity of the depressive symptoms  Discharge Diagnoses:   AXIS I:  Major Depression recurrent, Panic Disorder, Mood Disorder NOS AXIS II:  No diagnosis AXIS III:   Past Medical History  Diagnosis Date  . Bronchitis     history of  . Headache(784.0)   . Depression   . Anxiety   . GERD (gastroesophageal reflux disease)     uses otc acid reducer  . Endometriosis   . Renal disorder     kidney stones  . Bipolar 1 disorder   . UTI (lower urinary tract infection)   . Cigarette smoker   . Chronic bronchitis   . Hyperlipidemia    AXIS IV:  other psychosocial or environmental problems AXIS V:  61-70 mild symptoms  Plan Of Care/Follow-up recommendations:  Activity:  as tolerated Diet:  regular Follow up [facility] July 2 Is patient on multiple antipsychotic therapies at discharge:  No   Has Patient had three or more failed trials of antipsychotic monotherapy by history:  No  Recommended Plan for Multiple Antipsychotic Therapies: NA    Lashaunta Sicard A 01/14/2014, 1:04 PM

## 2014-01-14 NOTE — Progress Notes (Signed)
Pt d/c from the hospital with a bus pass. All items returned. D/C instructions given, samples given and prescriptions given. Pt denies si and hi.

## 2014-01-14 NOTE — Progress Notes (Signed)
[facility] :  Will you be returning to the same living situation after discharge: Yes,  [place] At discharge, do you have transportation home?:Yes,  provided pt with bus passes Do you have the ability to pay for your medications:Yes,  provided pt with prescriptions and pt verbalizes ability to afford meds  Release of information consent forms completed and in the chart;  Patient's signature needed at discharge.  Patient to Follow up at: Follow-up Information   Follow up with [facility] On 01/17/2014. (Appointment scheduled at 2:00 pm on this date with Dr. Betti Cruzeddy for medication management)    Contact information:   [address] [place], [place] [address] Phone: [phone] Fax: [phone]      Patient denies SI/HI:   Yes,  denies SI/HI    Safety Planning and Suicide Prevention discussed:  Yes,  discussed with pt.  Pt refused consent to contact family/friend.  See suicide prevention education note.   Carmina MillerHorton, Tanasha Menees Nicole 01/14/2014, 11:14 AM
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
## 2014-01-14 NOTE — [facility] Group Notes (Signed)
[facility] L[facility] Discharge Planning Group Note   01/14/2014 8:45 AM  Participation Quality:  Alert, Appropriate and Oriented  Mood/Affect:  Calm  Depression Rating:  2  Anxiety Rating:  2  Thoughts of Suicide:  Pt denies SI/HI  Will you contract for safety?   Yes  Current AVH:  Pt denies  Plan for Discharge/Comments:  Pt attended discharge planning group and actively participated in group.  CSW provided pt with today's workbook.  Pt reports feeling stable to d/c today.  [place] and has follow up scheduled at [facility] for outpatient medication management.  No further needs voiced by pt at this time.    Transportation Means: Pt reports access to transportation - provided pt with bus passes  Supports: No supports mentioned at this time  Reyes IvanChelsea Horton, LCSW 01/14/2014 9:51 AM

## 2014-01-15 ENCOUNTER — Encounter (HOSPITAL_COMMUNITY): Payer: Self-pay | Admitting: Emergency Medicine

## 2014-01-15 ENCOUNTER — Emergency Department (HOSPITAL_COMMUNITY)
Admission: EM | Admit: 2014-01-15 | Discharge: 2014-01-15 | Disposition: A | Discharge status: Home / Self Care | Payer: Medicaid Other | Attending: Emergency Medicine | Admitting: Emergency Medicine

## 2014-01-15 DIAGNOSIS — R109 Unspecified abdominal pain: Secondary | ICD-10-CM | POA: Insufficient documentation

## 2014-01-15 DIAGNOSIS — K219 Gastro-esophageal reflux disease without esophagitis: Secondary | ICD-10-CM | POA: Insufficient documentation

## 2014-01-15 DIAGNOSIS — Z862 Personal history of diseases of the blood and blood-forming organs and certain disorders involving the immune mechanism: Secondary | ICD-10-CM | POA: Insufficient documentation

## 2014-01-15 DIAGNOSIS — Z3202 Encounter for pregnancy test, result negative: Secondary | ICD-10-CM | POA: Insufficient documentation

## 2014-01-15 DIAGNOSIS — F411 Generalized anxiety disorder: Secondary | ICD-10-CM | POA: Insufficient documentation

## 2014-01-15 DIAGNOSIS — F3289 Other specified depressive episodes: Secondary | ICD-10-CM | POA: Insufficient documentation

## 2014-01-15 DIAGNOSIS — Z8639 Personal history of other endocrine, nutritional and metabolic disease: Secondary | ICD-10-CM | POA: Insufficient documentation

## 2014-01-15 DIAGNOSIS — R300 Dysuria: Secondary | ICD-10-CM | POA: Insufficient documentation

## 2014-01-15 DIAGNOSIS — Z9889 Other specified postprocedural states: Secondary | ICD-10-CM | POA: Insufficient documentation

## 2014-01-15 DIAGNOSIS — Z8709 Personal history of other diseases of the respiratory system: Secondary | ICD-10-CM | POA: Insufficient documentation

## 2014-01-15 DIAGNOSIS — F319 Bipolar disorder, unspecified: Secondary | ICD-10-CM | POA: Insufficient documentation

## 2014-01-15 DIAGNOSIS — F17200 Nicotine dependence, unspecified, uncomplicated: Secondary | ICD-10-CM | POA: Insufficient documentation

## 2014-01-15 DIAGNOSIS — F329 Major depressive disorder, single episode, unspecified: Secondary | ICD-10-CM | POA: Insufficient documentation

## 2014-01-15 LAB — CBC WITH DIFFERENTIAL/PLATELET
BASOS PCT: 0 % (ref 0–1)
Basophils Absolute: 0 10*3/uL (ref 0.0–0.1)
EOS PCT: 4 % (ref 0–5)
Eosinophils Absolute: 0.4 10*3/uL (ref 0.0–0.7)
HEMATOCRIT: 39.1 % (ref 36.0–46.0)
HEMOGLOBIN: 13.6 g/dL (ref 12.0–15.0)
LYMPHS PCT: 36 % (ref 12–46)
Lymphs Abs: 2.9 10*3/uL (ref 0.7–4.0)
MCH: 30.2 pg (ref 26.0–34.0)
MCHC: 34.8 g/dL (ref 30.0–36.0)
MCV: 86.7 fL (ref 78.0–100.0)
MONO ABS: 0.7 10*3/uL (ref 0.1–1.0)
MONOS PCT: 9 % (ref 3–12)
NEUTROS ABS: 4.2 10*3/uL (ref 1.7–7.7)
Neutrophils Relative %: 51 % (ref 43–77)
Platelets: 358 10*3/uL (ref 150–400)
RBC: 4.51 MIL/uL (ref 3.87–5.11)
RDW: 12.7 % (ref 11.5–15.5)
WBC: 8.2 10*3/uL (ref 4.0–10.5)

## 2014-01-15 LAB — BASIC METABOLIC PANEL
BUN: 15 mg/dL (ref 6–23)
CO2: 26 meq/L (ref 19–32)
CREATININE: 0.61 mg/dL (ref 0.50–1.10)
Calcium: 9.1 mg/dL (ref 8.4–10.5)
Chloride: 98 mEq/L (ref 96–112)
GFR calc Af Amer: >90 mL/min (ref >90)
GFR calc non Af Amer: >90 mL/min (ref >90)
Glucose, Bld: 95 mg/dL (ref 70–99)
Potassium: 4 mEq/L (ref 3.7–5.3)
Sodium: 138 mEq/L (ref 137–147)

## 2014-01-15 LAB — POC URINE PREG, ED: Preg Test, Ur: NEGATIVE

## 2014-01-15 LAB — URINE MICROSCOPIC-ADD ON

## 2014-01-15 LAB — URINALYSIS, ROUTINE W REFLEX MICROSCOPIC
Glucose, UA: NEGATIVE mg/dL
Ketones, ur: NEGATIVE mg/dL
Leukocytes, UA: NEGATIVE
Nitrite: NEGATIVE
PROTEIN: NEGATIVE mg/dL
Specific Gravity, Urine: 1.023 (ref 1.005–1.030)
Urobilinogen, UA: 0.2 mg/dL (ref 0.0–1.0)
pH: 5.5 (ref 5.0–8.0)

## 2014-01-15 MED ORDER — OXYCODONE-ACETAMINOPHEN 5-325 MG PO TABS: 1.0000 | ORAL_TABLET | Freq: Four times a day (QID) | ORAL | Status: DC | PRN

## 2014-01-15 MED ORDER — SODIUM CHLORIDE 0.9 % IV BOLUS (SEPSIS)
500.0000 mL | INTRAVENOUS | Status: AC
  Administered 2014-01-15: 500 mL via INTRAVENOUS

## 2014-01-15 MED ORDER — ONDANSETRON HCL 4 MG/2ML IJ SOLN
4.0000 mg | Freq: Once | INTRAMUSCULAR | Status: AC
  Administered 2014-01-15: 4 mg via INTRAVENOUS
  Filled 2014-01-15: qty 2

## 2014-01-15 MED ORDER — CEPHALEXIN 500 MG PO CAPS: 500.0000 mg | ORAL_CAPSULE | Freq: Four times a day (QID) | ORAL | Status: DC

## 2014-01-15 MED ORDER — HYDROMORPHONE HCL PF 1 MG/ML IJ SOLN
0.5000 mg | Freq: Once | INTRAMUSCULAR | Status: AC
  Administered 2014-01-15: 0.5 mg via INTRAVENOUS
  Filled 2014-01-15: qty 1
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
## 2014-01-15 NOTE — ED Notes (Signed)
Present with right flank pain, dysuria, frequency and urgency began 2 days ago. Pain is severe and pt states, "I have had kidney stones before and it almost feels like that"

## 2014-01-17 LAB — URINE CULTURE: Colony Count: >=100000

## 2014-01-17 NOTE — Progress Notes (Signed)
Patient Discharge Instructions:  After Visit Summary (AVS):   Faxed to:  01/17/14 Discharge Summary Note:   Faxed to:  01/17/14 Psychiatric Admission Assessment Note:   Faxed to:  01/17/14 Suicide Risk Assessment - Discharge Assessment:   Faxed to:  01/17/14 Faxed/Sent to the Next Level Care provider:  01/17/14 Faxed to [facility] - Dr. Betti Cruzeddy @ [phone] Audrey ReddenSheena E Stein, 01/17/2014, 3:07 PM

## 2014-02-07 ENCOUNTER — Emergency Department (HOSPITAL_COMMUNITY)
Admission: EM | Admit: 2014-02-07 | Discharge: 2014-02-07 | Disposition: A | Discharge status: Home / Self Care | Payer: Medicaid Other | Attending: Emergency Medicine | Admitting: Emergency Medicine

## 2014-02-07 ENCOUNTER — Encounter (HOSPITAL_COMMUNITY): Payer: Self-pay | Admitting: Emergency Medicine

## 2014-02-07 DIAGNOSIS — Z9889 Other specified postprocedural states: Secondary | ICD-10-CM | POA: Diagnosis not present

## 2014-02-07 DIAGNOSIS — Z8709 Personal history of other diseases of the respiratory system: Secondary | ICD-10-CM | POA: Diagnosis not present

## 2014-02-07 DIAGNOSIS — F17200 Nicotine dependence, unspecified, uncomplicated: Secondary | ICD-10-CM | POA: Insufficient documentation

## 2014-02-07 DIAGNOSIS — Z9089 Acquired absence of other organs: Secondary | ICD-10-CM | POA: Insufficient documentation

## 2014-02-07 DIAGNOSIS — Z87442 Personal history of urinary calculi: Secondary | ICD-10-CM | POA: Diagnosis not present

## 2014-02-07 DIAGNOSIS — R109 Unspecified abdominal pain: Secondary | ICD-10-CM | POA: Diagnosis present

## 2014-02-07 DIAGNOSIS — Z87440 Personal history of urinary (tract) infections: Secondary | ICD-10-CM | POA: Diagnosis not present

## 2014-02-07 DIAGNOSIS — Z862 Personal history of diseases of the blood and blood-forming organs and certain disorders involving the immune mechanism: Secondary | ICD-10-CM | POA: Insufficient documentation

## 2014-02-07 DIAGNOSIS — Z8659 Personal history of other mental and behavioral disorders: Secondary | ICD-10-CM | POA: Diagnosis not present

## 2014-02-07 DIAGNOSIS — Z3202 Encounter for pregnancy test, result negative: Secondary | ICD-10-CM | POA: Insufficient documentation

## 2014-02-07 DIAGNOSIS — Z8742 Personal history of other diseases of the female genital tract: Secondary | ICD-10-CM | POA: Diagnosis not present

## 2014-02-07 DIAGNOSIS — Z791 Long term (current) use of non-steroidal anti-inflammatories (NSAID): Secondary | ICD-10-CM | POA: Diagnosis not present

## 2014-02-07 DIAGNOSIS — M791 Myalgia, unspecified site: Secondary | ICD-10-CM

## 2014-02-07 DIAGNOSIS — K219 Gastro-esophageal reflux disease without esophagitis: Secondary | ICD-10-CM | POA: Insufficient documentation

## 2014-02-07 DIAGNOSIS — IMO0001 Reserved for inherently not codable concepts without codable children: Secondary | ICD-10-CM | POA: Insufficient documentation

## 2014-02-07 DIAGNOSIS — Z79899 Other long term (current) drug therapy: Secondary | ICD-10-CM | POA: Diagnosis not present

## 2014-02-07 DIAGNOSIS — Z8639 Personal history of other endocrine, nutritional and metabolic disease: Secondary | ICD-10-CM | POA: Insufficient documentation

## 2014-02-07 LAB — URINALYSIS, ROUTINE W REFLEX MICROSCOPIC
Bilirubin Urine: NEGATIVE
Glucose, UA: NEGATIVE mg/dL
Hgb urine dipstick: NEGATIVE
KETONES UR: NEGATIVE mg/dL
LEUKOCYTES UA: NEGATIVE
NITRITE: NEGATIVE
PROTEIN: NEGATIVE mg/dL
Specific Gravity, Urine: 1.011 (ref 1.005–1.030)
UROBILINOGEN UA: 0.2 mg/dL (ref 0.0–1.0)
pH: 6 (ref 5.0–8.0)

## 2014-02-07 LAB — POC URINE PREG, ED: PREG TEST UR: NEGATIVE

## 2014-02-07 MED ORDER — NAPROXEN 500 MG PO TABS: 500.0000 mg | ORAL_TABLET | Freq: Two times a day (BID) | ORAL | Status: DC

## 2014-02-07 MED ORDER — ONDANSETRON 4 MG PO TBDP
4.0000 mg | ORAL_TABLET | Freq: Once | ORAL | Status: AC
  Administered 2014-02-07: 4 mg via ORAL
  Filled 2014-02-07: qty 1

## 2014-02-07 MED ORDER — HYDROCODONE-ACETAMINOPHEN 5-325 MG PO TABS
2.0000 | ORAL_TABLET | Freq: Once | ORAL | Status: AC
  Administered 2014-02-07: 2 via ORAL
  Filled 2014-02-07: qty 2

## 2014-02-07 MED ORDER — METHOCARBAMOL 500 MG PO TABS: 500.0000 mg | ORAL_TABLET | Freq: Two times a day (BID) | ORAL | Status: DC
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
## 2014-02-07 NOTE — Discharge Instructions (Signed)

## 2014-02-07 NOTE — ED Notes (Addendum)
Pt reports lower back pain especially on R side for 4 days with burning with urination. Pt also having nausea. Denies fevers, vaginal discharge or bleeding. Has not taken anything at home for pain. Hx of kidney stones as well.

## 2014-02-18 ENCOUNTER — Emergency Department (HOSPITAL_COMMUNITY): Payer: Medicaid Other

## 2014-02-18 ENCOUNTER — Emergency Department (HOSPITAL_COMMUNITY)
Admission: EM | Admit: 2014-02-18 | Discharge: 2014-02-18 | Disposition: A | Discharge status: Home / Self Care | Payer: Medicaid Other | Attending: Emergency Medicine | Admitting: Emergency Medicine

## 2014-02-18 ENCOUNTER — Encounter (HOSPITAL_COMMUNITY): Payer: Self-pay | Admitting: Emergency Medicine

## 2014-02-18 DIAGNOSIS — Z862 Personal history of diseases of the blood and blood-forming organs and certain disorders involving the immune mechanism: Secondary | ICD-10-CM | POA: Diagnosis not present

## 2014-02-18 DIAGNOSIS — Z8742 Personal history of other diseases of the female genital tract: Secondary | ICD-10-CM | POA: Insufficient documentation

## 2014-02-18 DIAGNOSIS — R300 Dysuria: Secondary | ICD-10-CM | POA: Diagnosis present

## 2014-02-18 DIAGNOSIS — Z8639 Personal history of other endocrine, nutritional and metabolic disease: Secondary | ICD-10-CM | POA: Insufficient documentation

## 2014-02-18 DIAGNOSIS — Z87442 Personal history of urinary calculi: Secondary | ICD-10-CM | POA: Diagnosis not present

## 2014-02-18 DIAGNOSIS — Z9889 Other specified postprocedural states: Secondary | ICD-10-CM | POA: Diagnosis not present

## 2014-02-18 DIAGNOSIS — Z79899 Other long term (current) drug therapy: Secondary | ICD-10-CM | POA: Insufficient documentation

## 2014-02-18 DIAGNOSIS — R51 Headache: Secondary | ICD-10-CM | POA: Insufficient documentation

## 2014-02-18 DIAGNOSIS — R109 Unspecified abdominal pain: Secondary | ICD-10-CM | POA: Diagnosis not present

## 2014-02-18 DIAGNOSIS — N390 Urinary tract infection, site not specified: Secondary | ICD-10-CM | POA: Diagnosis not present

## 2014-02-18 DIAGNOSIS — Z9089 Acquired absence of other organs: Secondary | ICD-10-CM | POA: Diagnosis not present

## 2014-02-18 DIAGNOSIS — Z8659 Personal history of other mental and behavioral disorders: Secondary | ICD-10-CM | POA: Insufficient documentation

## 2014-02-18 DIAGNOSIS — K219 Gastro-esophageal reflux disease without esophagitis: Secondary | ICD-10-CM | POA: Diagnosis not present

## 2014-02-18 DIAGNOSIS — F17200 Nicotine dependence, unspecified, uncomplicated: Secondary | ICD-10-CM | POA: Insufficient documentation

## 2014-02-18 DIAGNOSIS — Z3202 Encounter for pregnancy test, result negative: Secondary | ICD-10-CM | POA: Diagnosis not present

## 2014-02-18 LAB — BASIC METABOLIC PANEL
ANION GAP: 12 (ref 5–15)
BUN: 8 mg/dL (ref 6–23)
CHLORIDE: 104 meq/L (ref 96–112)
CO2: 22 meq/L (ref 19–32)
CREATININE: 0.6 mg/dL (ref 0.50–1.10)
Calcium: 9.2 mg/dL (ref 8.4–10.5)
GFR calc Af Amer: >90 mL/min (ref >90)
GFR calc non Af Amer: >90 mL/min (ref >90)
GLUCOSE: 82 mg/dL (ref 70–99)
Potassium: 4.2 mEq/L (ref 3.7–5.3)
Sodium: 138 mEq/L (ref 137–147)

## 2014-02-18 LAB — URINALYSIS, ROUTINE W REFLEX MICROSCOPIC
Bilirubin Urine: NEGATIVE
GLUCOSE, UA: NEGATIVE mg/dL
Ketones, ur: NEGATIVE mg/dL
Nitrite: NEGATIVE
PH: 6 (ref 5.0–8.0)
Protein, ur: NEGATIVE mg/dL
SPECIFIC GRAVITY, URINE: 1.016 (ref 1.005–1.030)
Urobilinogen, UA: 0.2 mg/dL (ref 0.0–1.0)

## 2014-02-18 LAB — CBC WITH DIFFERENTIAL/PLATELET
BASOS ABS: 0 10*3/uL (ref 0.0–0.1)
Basophils Relative: 0 % (ref 0–1)
EOS PCT: 4 % (ref 0–5)
Eosinophils Absolute: 0.3 10*3/uL (ref 0.0–0.7)
HEMATOCRIT: 42.5 % (ref 36.0–46.0)
Hemoglobin: 14.7 g/dL (ref 12.0–15.0)
LYMPHS ABS: 3.1 10*3/uL (ref 0.7–4.0)
LYMPHS PCT: 37 % (ref 12–46)
MCH: 29.6 pg (ref 26.0–34.0)
MCHC: 34.6 g/dL (ref 30.0–36.0)
MCV: 85.5 fL (ref 78.0–100.0)
Monocytes Absolute: 0.6 10*3/uL (ref 0.1–1.0)
Monocytes Relative: 7 % (ref 3–12)
NEUTROS ABS: 4.4 10*3/uL (ref 1.7–7.7)
Neutrophils Relative %: 52 % (ref 43–77)
Platelets: 329 10*3/uL (ref 150–400)
RBC: 4.97 MIL/uL (ref 3.87–5.11)
RDW: 12.9 % (ref 11.5–15.5)
WBC: 8.5 10*3/uL (ref 4.0–10.5)

## 2014-02-18 LAB — URINE MICROSCOPIC-ADD ON

## 2014-02-18 LAB — POC URINE PREG, ED: Preg Test, Ur: NEGATIVE

## 2014-02-18 MED ORDER — GI COCKTAIL ~~LOC~~
30.0000 mL | Freq: Once | ORAL | Status: AC
  Administered 2014-02-18: 30 mL via ORAL
  Filled 2014-02-18: qty 30

## 2014-02-18 MED ORDER — SULFAMETHOXAZOLE-TMP DS 800-160 MG PO TABS: 1.0000 | ORAL_TABLET | Freq: Two times a day (BID) | ORAL | Status: DC

## 2014-02-18 MED ORDER — ONDANSETRON HCL 4 MG/2ML IJ SOLN
4.0000 mg | Freq: Once | INTRAMUSCULAR | Status: AC
  Administered 2014-02-18: 4 mg via INTRAVENOUS
  Filled 2014-02-18: qty 2

## 2014-02-18 MED ORDER — SODIUM CHLORIDE 0.9 % IV BOLUS (SEPSIS)
1000.0000 mL | Freq: Once | INTRAVENOUS | Status: AC
  Administered 2014-02-18: 1000 mL via INTRAVENOUS

## 2014-02-18 MED ORDER — OXYCODONE-ACETAMINOPHEN 5-325 MG PO TABS: 1.0000 | ORAL_TABLET | Freq: Four times a day (QID) | ORAL | Status: DC | PRN

## 2014-02-18 MED ORDER — ONDANSETRON HCL 4 MG PO TABS: 4.0000 mg | ORAL_TABLET | Freq: Four times a day (QID) | ORAL | Status: DC

## 2014-02-18 MED ORDER — HYDROMORPHONE HCL PF 1 MG/ML IJ SOLN
1.0000 mg | Freq: Once | INTRAMUSCULAR | Status: AC
  Administered 2014-02-18: 1 mg via INTRAVENOUS
  Filled 2014-02-18: qty 1

## 2014-02-18 NOTE — Discharge Instructions (Signed)

## 2014-02-18 NOTE — ED Notes (Signed)
Pt alert and oriented x4. Respirations even and unlabored, bilateral symmetrical rise and fall of chest. Skin warm and dry. In no acute distress. Denies needs.   
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
## 2014-02-18 NOTE — ED Notes (Signed)
Pt has hx of kidney stones, c/o lower back pain and burning with urination.

## 2014-02-19 LAB — URINE CULTURE
COLONY COUNT: NO GROWTH
CULTURE: NO GROWTH

## 2014-03-13 ENCOUNTER — Emergency Department (HOSPITAL_COMMUNITY): Payer: Medicaid Other

## 2014-03-13 ENCOUNTER — Encounter (HOSPITAL_COMMUNITY): Payer: Self-pay | Admitting: Emergency Medicine

## 2014-03-13 ENCOUNTER — Emergency Department (HOSPITAL_COMMUNITY)
Admission: EM | Admit: 2014-03-13 | Discharge: 2014-03-13 | Disposition: A | Discharge status: Home / Self Care | Payer: Medicaid Other | Attending: Emergency Medicine | Admitting: Emergency Medicine

## 2014-03-13 DIAGNOSIS — K529 Noninfective gastroenteritis and colitis, unspecified: Secondary | ICD-10-CM

## 2014-03-13 DIAGNOSIS — K219 Gastro-esophageal reflux disease without esophagitis: Secondary | ICD-10-CM | POA: Insufficient documentation

## 2014-03-13 DIAGNOSIS — R112 Nausea with vomiting, unspecified: Secondary | ICD-10-CM

## 2014-03-13 DIAGNOSIS — R300 Dysuria: Secondary | ICD-10-CM | POA: Diagnosis not present

## 2014-03-13 DIAGNOSIS — Z8709 Personal history of other diseases of the respiratory system: Secondary | ICD-10-CM | POA: Diagnosis not present

## 2014-03-13 DIAGNOSIS — Z8659 Personal history of other mental and behavioral disorders: Secondary | ICD-10-CM | POA: Diagnosis not present

## 2014-03-13 DIAGNOSIS — Z862 Personal history of diseases of the blood and blood-forming organs and certain disorders involving the immune mechanism: Secondary | ICD-10-CM | POA: Diagnosis not present

## 2014-03-13 DIAGNOSIS — Z79899 Other long term (current) drug therapy: Secondary | ICD-10-CM | POA: Insufficient documentation

## 2014-03-13 DIAGNOSIS — R1031 Right lower quadrant pain: Secondary | ICD-10-CM | POA: Diagnosis present

## 2014-03-13 DIAGNOSIS — K5289 Other specified noninfective gastroenteritis and colitis: Secondary | ICD-10-CM | POA: Insufficient documentation

## 2014-03-13 DIAGNOSIS — Z87442 Personal history of urinary calculi: Secondary | ICD-10-CM | POA: Insufficient documentation

## 2014-03-13 DIAGNOSIS — N898 Other specified noninflammatory disorders of vagina: Secondary | ICD-10-CM | POA: Diagnosis not present

## 2014-03-13 DIAGNOSIS — Z3202 Encounter for pregnancy test, result negative: Secondary | ICD-10-CM | POA: Insufficient documentation

## 2014-03-13 DIAGNOSIS — R197 Diarrhea, unspecified: Secondary | ICD-10-CM

## 2014-03-13 DIAGNOSIS — F17200 Nicotine dependence, unspecified, uncomplicated: Secondary | ICD-10-CM | POA: Insufficient documentation

## 2014-03-13 DIAGNOSIS — Z8639 Personal history of other endocrine, nutritional and metabolic disease: Secondary | ICD-10-CM | POA: Insufficient documentation

## 2014-03-13 DIAGNOSIS — Z87440 Personal history of urinary (tract) infections: Secondary | ICD-10-CM | POA: Insufficient documentation

## 2014-03-13 LAB — COMPREHENSIVE METABOLIC PANEL WITH GFR
ALT: 78 U/L — ABNORMAL HIGH (ref 0–35)
AST: 69 U/L — ABNORMAL HIGH (ref 0–37)
Albumin: 3.8 g/dL (ref 3.5–5.2)
Alkaline Phosphatase: 76 U/L (ref 39–117)
Anion gap: 13 (ref 5–15)
BUN: 9 mg/dL (ref 6–23)
CO2: 24 meq/L (ref 19–32)
Calcium: 9.4 mg/dL (ref 8.4–10.5)
Chloride: 99 meq/L (ref 96–112)
Creatinine, Ser: 0.64 mg/dL (ref 0.50–1.10)
GFR calc Af Amer: >90 mL/min
GFR calc non Af Amer: >90 mL/min
Glucose, Bld: 86 mg/dL (ref 70–99)
Potassium: 4.3 meq/L (ref 3.7–5.3)
Sodium: 136 meq/L — ABNORMAL LOW (ref 137–147)
Total Bilirubin: 0.3 mg/dL (ref 0.3–1.2)
Total Protein: 8.2 g/dL (ref 6.0–8.3)

## 2014-03-13 LAB — URINALYSIS, ROUTINE W REFLEX MICROSCOPIC
BILIRUBIN URINE: NEGATIVE
Glucose, UA: NEGATIVE mg/dL
HGB URINE DIPSTICK: NEGATIVE
Ketones, ur: NEGATIVE mg/dL
Leukocytes, UA: NEGATIVE
Nitrite: NEGATIVE
PH: 7 (ref 5.0–8.0)
Protein, ur: NEGATIVE mg/dL
Specific Gravity, Urine: 1.015 (ref 1.005–1.030)
UROBILINOGEN UA: 0.2 mg/dL (ref 0.0–1.0)

## 2014-03-13 LAB — CBC WITH DIFFERENTIAL/PLATELET
Basophils Absolute: 0 10*3/uL (ref 0.0–0.1)
Basophils Relative: 0 % (ref 0–1)
Eosinophils Absolute: 0.2 10*3/uL (ref 0.0–0.7)
Eosinophils Relative: 2 % (ref 0–5)
HCT: 43.4 % (ref 36.0–46.0)
Hemoglobin: 14.6 g/dL (ref 12.0–15.0)
Lymphocytes Relative: 22 % (ref 12–46)
Lymphs Abs: 1.9 10*3/uL (ref 0.7–4.0)
MCH: 29.4 pg (ref 26.0–34.0)
MCHC: 33.6 g/dL (ref 30.0–36.0)
MCV: 87.5 fL (ref 78.0–100.0)
Monocytes Absolute: 0.5 10*3/uL (ref 0.1–1.0)
Monocytes Relative: 6 % (ref 3–12)
Neutro Abs: 5.9 10*3/uL (ref 1.7–7.7)
Neutrophils Relative %: 70 % (ref 43–77)
Platelets: 334 10*3/uL (ref 150–400)
RBC: 4.96 MIL/uL (ref 3.87–5.11)
RDW: 13.2 % (ref 11.5–15.5)
WBC: 8.5 10*3/uL (ref 4.0–10.5)

## 2014-03-13 LAB — WET PREP, GENITAL
CLUE CELLS WET PREP: NONE SEEN
TRICH WET PREP: NONE SEEN
Yeast Wet Prep HPF POC: NONE SEEN

## 2014-03-13 LAB — PREGNANCY, URINE: Preg Test, Ur: NEGATIVE

## 2014-03-13 LAB — LIPASE, BLOOD: Lipase: 28 U/L (ref 11–59)

## 2014-03-13 MED ORDER — METRONIDAZOLE 500 MG PO TABS: 500.0000 mg | ORAL_TABLET | Freq: Two times a day (BID) | ORAL | Status: DC

## 2014-03-13 MED ORDER — FENTANYL CITRATE 0.05 MG/ML IJ SOLN
50.0000 ug | Freq: Once | INTRAMUSCULAR | Status: AC
  Administered 2014-03-13: 50 ug via INTRAVENOUS
  Filled 2014-03-13: qty 2

## 2014-03-13 MED ORDER — ONDANSETRON HCL 4 MG PO TABS: 4.0000 mg | ORAL_TABLET | Freq: Three times a day (TID) | ORAL | Status: DC | PRN

## 2014-03-13 MED ORDER — CIPROFLOXACIN HCL 500 MG PO TABS: 500.0000 mg | ORAL_TABLET | Freq: Two times a day (BID) | ORAL | Status: DC

## 2014-03-13 MED ORDER — OXYCODONE-ACETAMINOPHEN 5-325 MG PO TABS
1.0000 | ORAL_TABLET | Freq: Once | ORAL | Status: AC
  Administered 2014-03-13: 1 via ORAL
  Filled 2014-03-13: qty 1

## 2014-03-13 MED ORDER — IOHEXOL 300 MG/ML  SOLN
50.0000 mL | Freq: Once | INTRAMUSCULAR | Status: AC | PRN
Start: 2014-03-13 — End: 2014-03-13
  Administered 2014-03-13: 50 mL via ORAL

## 2014-03-13 MED ORDER — ONDANSETRON HCL 4 MG/2ML IJ SOLN
4.0000 mg | Freq: Once | INTRAMUSCULAR | Status: AC
  Administered 2014-03-13: 4 mg via INTRAVENOUS
  Filled 2014-03-13: qty 2

## 2014-03-13 MED ORDER — CIPROFLOXACIN HCL 500 MG PO TABS
500.0000 mg | ORAL_TABLET | Freq: Once | ORAL | Status: AC
  Administered 2014-03-13: 500 mg via ORAL
  Filled 2014-03-13: qty 1

## 2014-03-13 MED ORDER — IOHEXOL 300 MG/ML  SOLN
100.0000 mL | Freq: Once | INTRAMUSCULAR | Status: AC | PRN
  Administered 2014-03-13: 100 mL via INTRAVENOUS

## 2014-03-13 MED ORDER — MORPHINE SULFATE 4 MG/ML IJ SOLN
4.0000 mg | Freq: Once | INTRAMUSCULAR | Status: AC
  Administered 2014-03-13: 4 mg via INTRAVENOUS
  Filled 2014-03-13: qty 1

## 2014-03-13 MED ORDER — METRONIDAZOLE 500 MG PO TABS
500.0000 mg | ORAL_TABLET | Freq: Once | ORAL | Status: AC
  Administered 2014-03-13: 500 mg via ORAL
  Filled 2014-03-13: qty 1

## 2014-03-13 MED ORDER — OXYCODONE-ACETAMINOPHEN 5-325 MG PO TABS: 1.0000 | ORAL_TABLET | ORAL | Status: DC | PRN

## 2014-03-13 MED ORDER — SODIUM CHLORIDE 0.9 % IV BOLUS (SEPSIS)
1000.0000 mL | Freq: Once | INTRAVENOUS | Status: AC
  Administered 2014-03-13: 1000 mL via INTRAVENOUS

## 2014-03-13 NOTE — ED Provider Notes (Signed)
TIME SEEN: 5:15 PM  CHIEF COMPLAINT: Fever, vomiting or diarrhea, right lower quadrant pain, dysuria, vaginal discharge  HPI: Patient is a 38 year old G2 P2 with history of kidney stones who presents to the emergency department with right lower quadrant pain for the past 2 days. She describes as sharp without radiation. Aggravating or alleviating factors. She states she's had a fever of 100 and, chills, vomiting and diarrhea. She also reports she is having burning with urination and vaginal discharge that is new for her. She is sectioned active with one partner. Denies a prior history of STDs. Last menstrual period was August 15. Denies any sick contacts, recent travel, recent hospitalization. She was on antibiotics the beginning of August for urinary tract infection. She has a history of cholecystectomy.  ROS: See HPI Constitutional: no fever  Eyes: no drainage  ENT: no runny nose   Cardiovascular:  no chest pain  Resp: no SOB  GI:  vomiting GU: dysuria Integumentary: no rash  Allergy: no hives  Musculoskeletal: no leg swelling  Neurological: no slurred speech ROS otherwise negative  PAST MEDICAL HISTORY/PAST SURGICAL HISTORY:  Past Medical History  Diagnosis Date  . Bronchitis     history of  . Headache(784.0)   . Depression   . Anxiety   . GERD (gastroesophageal reflux disease)     uses otc acid reducer  . Endometriosis   . Renal disorder     kidney stones  . Bipolar 1 disorder   . UTI (lower urinary tract infection)   . Cigarette smoker   . Chronic bronchitis   . Hyperlipidemia     MEDICATIONS:  Prior to Admission medications   Medication Sig Start Date End Date Taking? Authorizing Provider  albuterol (PROVENTIL HFA;VENTOLIN HFA) 108 (90 BASE) MCG/ACT inhaler Inhale 2 puffs into the lungs every 4 (four) hours as needed for wheezing or shortness of breath. Ventolin 2 puff every 12 hours and proair 2 puffs every 4 hours 01/14/14  Yes Sanjuana Kava, NP   aspirin-acetaminophen-caffeine (EXCEDRIN MIGRAINE) [phone] MG per tablet Take 2 tablets by mouth daily as needed for headache. 01/14/14  Yes Sanjuana Kava, NP  ibuprofen (ADVIL,MOTRIN) 200 MG tablet Take 600 mg by mouth every 6 (six) hours as needed for moderate pain.   Yes Historical Provider, MD  omeprazole (PRILOSEC) 40 MG capsule Take 1 capsule (40 mg total) by mouth 2 (two) times daily. For acid reflux 01/14/14  Yes Sanjuana Kava, NP    ALLERGIES:  Allergies  Allergen Reactions  . Risperidone And Related Anaphylaxis  . Tramadol Anaphylaxis  . Aripiprazole Other (See Comments)    Causes seizures    SOCIAL HISTORY:  History  Substance Use Topics  . Smoking status: Current Every Day Smoker -- 1.00 packs/day for 10 years    Types: Cigarettes  . Smokeless tobacco: Never Used  . Alcohol Use: No    FAMILY HISTORY: Family History  Problem Relation Age of Onset  . Heart attack Mother   . Heart attack Father   . Ovarian cancer Maternal Grandmother   . Kidney Stones Mother     EXAM: BP 128/80  Pulse 100  Temp(Src) 98.1 F (36.7 C) (Oral)  Resp 18  SpO2 100%  LMP 03/02/2014 CONSTITUTIONAL: Alert and oriented and responds appropriately to questions. Well-appearing; well-nourished HEAD: Normocephalic EYES: Conjunctivae clear, PERRL ENT: normal nose; no rhinorrhea; moist mucous membranes; pharynx without lesions noted NECK: Supple, no meningismus, no LAD  CARD: RRR; S1 and S2 appreciated; no  murmurs, no clicks, no rubs, no gallops RESP: Normal chest excursion without splinting or tachypnea; breath sounds clear and equal bilaterally; no wheezes, no rhonchi, no rales,  ABD/GI: Normal bowel sounds; non-distended; soft, tender to palpation in the right lower quadrant with voluntary guarding, no rebound GU:  Normal external genitalia, no vaginal bleeding, thick white vaginal discharge, patient has right adnexal tenderness without fullness, no cervical motion tenderness, no left  adnexal tenderness or fullness BACK:  The back appears normal and is non-tender to palpation, there is no CVA tenderness EXT: Normal ROM in all joints; non-tender to palpation; no edema; normal capillary refill; no cyanosis    SKIN: Normal color for age and race; warm NEURO: Moves all extremities equally PSYCH: The patient's mood and manner are appropriate. Grooming and personal hygiene are appropriate.  MEDICAL DECISION MAKING: Patient with right lower quadrant pain. Differential diagnosis includes UTI, ovarian cysts, kidney stone, appendicitis, colitis, torsion, TOA. We'll obtain labs, urine, pelvic exam with cultures. Pelvic reveals or adnexal tenderness. We'll start with ultrasound of her pelvis, and ovaries.  ED PROGRESS: Patient's labs are unremarkable other than mild elevation of her AST and ALT. She is status post cholecystectomy. She has absolutely no tenderness in her right upper quadrant but is still tender in her right pelvic region and at McBurney's point. Low suspicion for appendicitis given no fever here a leukocytosis but she states that she took Tylenol prior to arrival. Have offered watchful waiting versus CT now she states she would like a CT currently. Will give second dose of pain and nausea medication. Ultrasound shows no abnormality.   11:27 PM  Pt's CT scan shows wall thickening of the descending and rectosigmoid colon suggestive of colitis. Given her history of fever, vomiting and diarrhea, suspect this is infectious in nature. She has not had any further vomiting in the ED and is still hemodynamically stable. No bloody stool or melena. We'll discharge with prescription for Cipro, Flagyl, pain and nausea medication. I feel she is safe to be discharged home. We'll have her followup with her primary care physician for outpatient colonoscopy once symptoms have improved. Have discussed strict return precautions and supportive care instructions. She verbalized understanding is  comfortable with plan.      Angiocath insertion Performed by: Raelyn Number  Consent: Verbal consent obtained. Risks and benefits: risks, benefits and alternatives were discussed Time out: Immediately prior to procedure a "time out" was called to verify the correct patient, procedure, equipment, support staff and site/side marked as required.  Preparation: Patient was prepped and draped in the usual sterile fashion.  Vein Location: Right a.c.  Ultrasound Guided  Gauge: 20  Normal blood return and flush without difficulty Patient tolerance: Patient tolerated the procedure well with no immediate complications.     Layla Maw Lawernce Earll, DO 03/13/14 2328

## 2014-03-13 NOTE — ED Notes (Signed)
Pt c/o RLQ pain since yesterday w/ burning on urination.  States she had a fever at home.  (100.1).  States she is nauseated and has been having diarrhea.

## 2014-03-13 NOTE — Discharge Instructions (Signed)

## 2014-03-14 LAB — GC/CHLAMYDIA PROBE AMP
CT PROBE, AMP APTIMA: NEGATIVE
GC PROBE AMP APTIMA: NEGATIVE

## 2014-03-16 ENCOUNTER — Emergency Department (HOSPITAL_COMMUNITY): Payer: Medicaid Other

## 2014-03-16 ENCOUNTER — Encounter (HOSPITAL_COMMUNITY): Payer: Self-pay | Admitting: Emergency Medicine

## 2014-03-16 ENCOUNTER — Inpatient Hospital Stay (HOSPITAL_COMMUNITY)
Admission: EM | Admit: 2014-03-16 | Discharge: 2014-03-19 | DRG: 392 | Disposition: A | Discharge status: Home / Self Care | Payer: Medicaid Other | Attending: Internal Medicine | Admitting: Internal Medicine

## 2014-03-16 DIAGNOSIS — F411 Generalized anxiety disorder: Secondary | ICD-10-CM | POA: Diagnosis present

## 2014-03-16 DIAGNOSIS — E785 Hyperlipidemia, unspecified: Secondary | ICD-10-CM | POA: Diagnosis present

## 2014-03-16 DIAGNOSIS — K529 Noninfective gastroenteritis and colitis, unspecified: Secondary | ICD-10-CM | POA: Diagnosis present

## 2014-03-16 DIAGNOSIS — F319 Bipolar disorder, unspecified: Secondary | ICD-10-CM | POA: Diagnosis present

## 2014-03-16 DIAGNOSIS — K5289 Other specified noninfective gastroenteritis and colitis: Principal | ICD-10-CM | POA: Diagnosis present

## 2014-03-16 DIAGNOSIS — T3695XA Adverse effect of unspecified systemic antibiotic, initial encounter: Secondary | ICD-10-CM | POA: Diagnosis not present

## 2014-03-16 DIAGNOSIS — F17200 Nicotine dependence, unspecified, uncomplicated: Secondary | ICD-10-CM | POA: Diagnosis present

## 2014-03-16 DIAGNOSIS — J42 Unspecified chronic bronchitis: Secondary | ICD-10-CM | POA: Diagnosis present

## 2014-03-16 DIAGNOSIS — R109 Unspecified abdominal pain: Secondary | ICD-10-CM | POA: Diagnosis present

## 2014-03-16 DIAGNOSIS — N898 Other specified noninflammatory disorders of vagina: Secondary | ICD-10-CM | POA: Diagnosis not present

## 2014-03-16 DIAGNOSIS — K219 Gastro-esophageal reflux disease without esophagitis: Secondary | ICD-10-CM | POA: Diagnosis present

## 2014-03-16 DIAGNOSIS — Z720 Tobacco use: Secondary | ICD-10-CM | POA: Diagnosis present

## 2014-03-16 DIAGNOSIS — Z79899 Other long term (current) drug therapy: Secondary | ICD-10-CM

## 2014-03-16 LAB — CBC WITH DIFFERENTIAL/PLATELET
BASOS PCT: 0 % (ref 0–1)
Basophils Absolute: 0 10*3/uL (ref 0.0–0.1)
Eosinophils Absolute: 0.1 10*3/uL (ref 0.0–0.7)
Eosinophils Relative: 2 % (ref 0–5)
HEMATOCRIT: 42 % (ref 36.0–46.0)
Hemoglobin: 14.3 g/dL (ref 12.0–15.0)
Lymphocytes Relative: 26 % (ref 12–46)
Lymphs Abs: 1.7 10*3/uL (ref 0.7–4.0)
MCH: 29.4 pg (ref 26.0–34.0)
MCHC: 34 g/dL (ref 30.0–36.0)
MCV: 86.2 fL (ref 78.0–100.0)
Monocytes Absolute: 0.4 10*3/uL (ref 0.1–1.0)
Monocytes Relative: 7 % (ref 3–12)
NEUTROS ABS: 4.4 10*3/uL (ref 1.7–7.7)
Neutrophils Relative %: 65 % (ref 43–77)
Platelets: 279 10*3/uL (ref 150–400)
RBC: 4.87 MIL/uL (ref 3.87–5.11)
RDW: 13.2 % (ref 11.5–15.5)
WBC: 6.7 10*3/uL (ref 4.0–10.5)

## 2014-03-16 LAB — URINALYSIS, ROUTINE W REFLEX MICROSCOPIC
Bilirubin Urine: NEGATIVE
Bilirubin Urine: NEGATIVE
GLUCOSE, UA: NEGATIVE mg/dL
Glucose, UA: NEGATIVE mg/dL
Hgb urine dipstick: NEGATIVE
Hgb urine dipstick: NEGATIVE
KETONES UR: NEGATIVE mg/dL
Ketones, ur: NEGATIVE mg/dL
LEUKOCYTES UA: NEGATIVE
Leukocytes, UA: NEGATIVE
Nitrite: NEGATIVE
Nitrite: NEGATIVE
PH: 7 (ref 5.0–8.0)
Protein, ur: NEGATIVE mg/dL
Protein, ur: NEGATIVE mg/dL
Specific Gravity, Urine: 1.008 (ref 1.005–1.030)
Specific Gravity, Urine: 1.004 — ABNORMAL LOW (ref 1.005–1.030)
UROBILINOGEN UA: 0.2 mg/dL (ref 0.0–1.0)
Urobilinogen, UA: 0.2 mg/dL (ref 0.0–1.0)
pH: 7 (ref 5.0–8.0)

## 2014-03-16 LAB — COMPREHENSIVE METABOLIC PANEL
ALBUMIN: 3.7 g/dL (ref 3.5–5.2)
ALT: 82 U/L — ABNORMAL HIGH (ref 0–35)
AST: 69 U/L — ABNORMAL HIGH (ref 0–37)
Alkaline Phosphatase: 67 U/L (ref 39–117)
Anion gap: 13 (ref 5–15)
BUN: 6 mg/dL (ref 6–23)
CO2: 25 mEq/L (ref 19–32)
Calcium: 9.4 mg/dL (ref 8.4–10.5)
Chloride: 102 mEq/L (ref 96–112)
Creatinine, Ser: 0.71 mg/dL (ref 0.50–1.10)
Glucose, Bld: 89 mg/dL (ref 70–99)
Potassium: 4.2 mEq/L (ref 3.7–5.3)
Sodium: 140 mEq/L (ref 137–147)
Total Bilirubin: 0.4 mg/dL (ref 0.3–1.2)
Total Protein: 7.9 g/dL (ref 6.0–8.3)

## 2014-03-16 LAB — POC URINE PREG, ED: Preg Test, Ur: NEGATIVE

## 2014-03-16 LAB — HIV ANTIBODY (ROUTINE TESTING W REFLEX): HIV 1&2 Ab, 4th Generation: NONREACTIVE

## 2014-03-16 LAB — LIPASE, BLOOD: LIPASE: 22 U/L (ref 11–59)

## 2014-03-16 MED ORDER — ACETAMINOPHEN 325 MG PO TABS: 650.0000 mg | ORAL_TABLET | Freq: Four times a day (QID) | ORAL | Status: DC | PRN

## 2014-03-16 MED ORDER — HYDROMORPHONE HCL PF 1 MG/ML IJ SOLN
0.5000 mg | INTRAMUSCULAR | Status: DC | PRN
  Administered 2014-03-16 (×2): 0.5 mg via INTRAVENOUS
  Administered 2014-03-17 (×2): 1 mg via INTRAVENOUS
  Filled 2014-03-16 (×4): qty 1

## 2014-03-16 MED ORDER — OXYCODONE-ACETAMINOPHEN 5-325 MG PO TABS
1.0000 | ORAL_TABLET | ORAL | Status: DC | PRN
  Administered 2014-03-16 – 2014-03-17 (×2): 1 via ORAL
  Filled 2014-03-16 (×2): qty 1

## 2014-03-16 MED ORDER — SODIUM CHLORIDE 0.9 % IV BOLUS (SEPSIS)
2000.0000 mL | Freq: Once | INTRAVENOUS | Status: AC
  Administered 2014-03-16: 2000 mL via INTRAVENOUS

## 2014-03-16 MED ORDER — MORPHINE SULFATE 4 MG/ML IJ SOLN
6.0000 mg | Freq: Once | INTRAMUSCULAR | Status: AC
  Administered 2014-03-16: 6 mg via INTRAVENOUS
  Filled 2014-03-16: qty 2

## 2014-03-16 MED ORDER — ONDANSETRON HCL 4 MG/2ML IJ SOLN
4.0000 mg | Freq: Four times a day (QID) | INTRAMUSCULAR | Status: DC
  Administered 2014-03-17 – 2014-03-18 (×4): 4 mg via INTRAVENOUS
  Filled 2014-03-16 (×4): qty 2

## 2014-03-16 MED ORDER — HYDROMORPHONE HCL PF 1 MG/ML IJ SOLN: 1.0000 mg | Freq: Once | INTRAMUSCULAR | Status: DC

## 2014-03-16 MED ORDER — CIPROFLOXACIN IN D5W 400 MG/200ML IV SOLN
400.0000 mg | Freq: Two times a day (BID) | INTRAVENOUS | Status: DC
  Administered 2014-03-17 – 2014-03-18 (×3): 400 mg via INTRAVENOUS
  Filled 2014-03-16 (×4): qty 200

## 2014-03-16 MED ORDER — PANTOPRAZOLE SODIUM 40 MG PO TBEC
40.0000 mg | DELAYED_RELEASE_TABLET | Freq: Every day | ORAL | Status: DC
  Filled 2014-03-16: qty 1

## 2014-03-16 MED ORDER — MORPHINE SULFATE 2 MG/ML IJ SOLN
2.0000 mg | INTRAMUSCULAR | Status: DC | PRN
Start: 2014-03-16 — End: 2014-03-16
  Administered 2014-03-16: 4 mg via INTRAVENOUS
  Filled 2014-03-16: qty 2

## 2014-03-16 MED ORDER — ZOLPIDEM TARTRATE 5 MG PO TABS
5.0000 mg | ORAL_TABLET | Freq: Every evening | ORAL | Status: DC | PRN
  Administered 2014-03-16 – 2014-03-18 (×3): 5 mg via ORAL
  Filled 2014-03-16 (×3): qty 1

## 2014-03-16 MED ORDER — LORAZEPAM 1 MG PO TABS
1.0000 mg | ORAL_TABLET | Freq: Four times a day (QID) | ORAL | Status: DC
  Administered 2014-03-16 – 2014-03-18 (×10): 1 mg via ORAL
  Filled 2014-03-16 (×10): qty 1

## 2014-03-16 MED ORDER — ACETAMINOPHEN 650 MG RE SUPP: 650.0000 mg | Freq: Four times a day (QID) | RECTAL | Status: DC | PRN

## 2014-03-16 MED ORDER — METRONIDAZOLE IN NACL 5-0.79 MG/ML-% IV SOLN
500.0000 mg | Freq: Three times a day (TID) | INTRAVENOUS | Status: DC
  Administered 2014-03-16 – 2014-03-18 (×5): 500 mg via INTRAVENOUS
  Filled 2014-03-16 (×6): qty 100

## 2014-03-16 MED ORDER — METRONIDAZOLE IN NACL 5-0.79 MG/ML-% IV SOLN
500.0000 mg | Freq: Once | INTRAVENOUS | Status: AC
  Administered 2014-03-16: 500 mg via INTRAVENOUS
  Filled 2014-03-16: qty 100

## 2014-03-16 MED ORDER — CIPROFLOXACIN IN D5W 400 MG/200ML IV SOLN
400.0000 mg | Freq: Once | INTRAVENOUS | Status: AC
  Administered 2014-03-16: 400 mg via INTRAVENOUS
  Filled 2014-03-16: qty 200

## 2014-03-16 MED ORDER — ONDANSETRON HCL 4 MG PO TABS
4.0000 mg | ORAL_TABLET | Freq: Four times a day (QID) | ORAL | Status: DC
  Administered 2014-03-16 – 2014-03-19 (×7): 4 mg via ORAL
  Filled 2014-03-16 (×19): qty 1

## 2014-03-16 MED ORDER — DEXTROSE-NACL 5-0.9 % IV SOLN
INTRAVENOUS | Status: DC
  Administered 2014-03-16: 100 mL/h via INTRAVENOUS
  Administered 2014-03-17: 05:00:00 via INTRAVENOUS
  Administered 2014-03-18: 1 mL via INTRAVENOUS
  Administered 2014-03-18 – 2014-03-19 (×2): via INTRAVENOUS

## 2014-03-16 MED ORDER — ENOXAPARIN SODIUM 40 MG/0.4ML ~~LOC~~ SOLN
40.0000 mg | SUBCUTANEOUS | Status: DC
  Administered 2014-03-16: 40 mg via SUBCUTANEOUS
  Filled 2014-03-16 (×5): qty 0.4

## 2014-03-16 MED ORDER — MORPHINE SULFATE 4 MG/ML IJ SOLN
4.0000 mg | Freq: Once | INTRAMUSCULAR | Status: AC
  Administered 2014-03-16: 4 mg via INTRAVENOUS
  Filled 2014-03-16: qty 1

## 2014-03-16 MED ORDER — NICOTINE 14 MG/24HR TD PT24
14.0000 mg | MEDICATED_PATCH | Freq: Every day | TRANSDERMAL | Status: DC
  Administered 2014-03-16 – 2014-03-18 (×3): 14 mg via TRANSDERMAL
  Filled 2014-03-16 (×4): qty 1

## 2014-03-16 MED ORDER — ALBUTEROL SULFATE (2.5 MG/3ML) 0.083% IN NEBU: 2.5000 mg | INHALATION_SOLUTION | RESPIRATORY_TRACT | Status: DC | PRN

## 2014-03-16 NOTE — ED Notes (Signed)
Pt aware of the need for a urine sample. 
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
## 2014-03-16 NOTE — ED Notes (Signed)
Two unsuccessful attempts at lab draw. Phlebotomy to attempt.

## 2014-03-16 NOTE — ED Notes (Signed)
Pt reports symptoms have worsened since Wednesday. Sts she had near syncopal episode at grocery store witnessed by husband where everything went black and she became lightheaded and diaphoretic. Recovered after sitting down. Reports n/v x6 and diarrhea once every 1-2 hours in last 24 hours. Sts zofran is not helping. Fever at home which she took tylenol for at 11am.

## 2014-03-16 NOTE — ED Notes (Signed)
Pt arm turns red and itches during morphine administration. No hives noted and no shortness of breath. Benadryl was offered to the patient denies offer.

## 2014-03-16 NOTE — ED Notes (Signed)
Pt c/o abd pain, n/v. Dx with colitis Wed here. Sts pain continues wants to be reevaluated. Pt reported to ems that she passed out this am. EMS VSS.
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
## 2014-03-17 ENCOUNTER — Inpatient Hospital Stay (HOSPITAL_COMMUNITY): Payer: Medicaid Other

## 2014-03-17 DIAGNOSIS — K5289 Other specified noninfective gastroenteritis and colitis: Principal | ICD-10-CM

## 2014-03-17 LAB — BASIC METABOLIC PANEL
ANION GAP: 12 (ref 5–15)
BUN: 5 mg/dL — ABNORMAL LOW (ref 6–23)
CHLORIDE: 105 meq/L (ref 96–112)
CO2: 24 mEq/L (ref 19–32)
CREATININE: 0.58 mg/dL (ref 0.50–1.10)
Calcium: 8.7 mg/dL (ref 8.4–10.5)
GFR calc Af Amer: >90 mL/min (ref >90)
GFR calc non Af Amer: >90 mL/min (ref >90)
Glucose, Bld: 106 mg/dL — ABNORMAL HIGH (ref 70–99)
POTASSIUM: 4 meq/L (ref 3.7–5.3)
Sodium: 141 mEq/L (ref 137–147)

## 2014-03-17 MED ORDER — MORPHINE SULFATE 2 MG/ML IJ SOLN
2.0000 mg | INTRAMUSCULAR | Status: DC | PRN
  Administered 2014-03-17 (×3): 4 mg via INTRAVENOUS
  Filled 2014-03-17 (×3): qty 2

## 2014-03-17 MED ORDER — ALUM & MAG HYDROXIDE-SIMETH 200-200-20 MG/5ML PO SUSP
15.0000 mL | Freq: Four times a day (QID) | ORAL | Status: DC | PRN
  Administered 2014-03-17 – 2014-03-18 (×3): 15 mL via ORAL
  Filled 2014-03-17 (×4): qty 30

## 2014-03-17 MED ORDER — MORPHINE SULFATE 4 MG/ML IJ SOLN
5.0000 mg | INTRAMUSCULAR | Status: DC | PRN
  Administered 2014-03-17 – 2014-03-18 (×4): 5 mg via INTRAVENOUS
  Filled 2014-03-17 (×4): qty 2

## 2014-03-17 MED ORDER — MORPHINE SULFATE 2 MG/ML IJ SOLN
2.0000 mg | INTRAMUSCULAR | Status: DC | PRN
  Administered 2014-03-17 (×2): 2 mg via INTRAVENOUS
  Filled 2014-03-17 (×2): qty 1

## 2014-03-17 NOTE — Progress Notes (Signed)
Patient states pain relief from morphine 4 mg iv is adequate but pain returns before 3 hour interval. I attempted to add percocet for break through pain but patient states percocet provides no relief. Dr Elvera Lennox notified and order noted.

## 2014-03-17 NOTE — Progress Notes (Signed)
PROGRESS NOTE  [name] ZOX:[id number] DOB: 1976/06/14 DOA: 03/16/2014 PCP: Aida Puffer, MD  HPI: [name] is a 38 y.o. female who began to have abdominal pain 3-4 days ago along with fevers, vomiting, diarrhea. She underwent a CT of the abdomen which revealed colitis and was discharged home with Flagyl and Cipro. However, due to ongoing vomiting, she was not able to keep down the medications and continued to have the symptoms and therefore returned today. She states her BMs are every 2-3 hrs and loose. No hematemesis or hematochezia noted. She had chicken prior to getting sick but her family had the same chicken and did not have any symptoms.   Subjective/ 24 H Interval events - no more diarrhea overnight, has abdominal pain  Assessment/Plan: Colitis of descendig and recto-sigmoid colon - awaiting stool studies, including C diff - feels about the same this morning, will try to eat - advance diet and transition to oral antibiotics as tolerated  Anxiety - stable Smoker - nicotine patch  Diet: full liquid Fluids: D5 1/2 NS DVT Prophylaxis: Lovenox  Code Status: Full Family Communication: d/w husband bedside  Disposition Plan: inpatient, home when ready   Consultants:  None   Procedures:  None    Antibiotics  Anti-infectives   Start     Dose/Rate Route Frequency Ordered Stop   03/17/14 0300  ciprofloxacin (CIPRO) IVPB 400 mg     400 mg 200 mL/hr over 60 Minutes Intravenous Every 12 hours 03/16/14 2148     03/16/14 2359  metroNIDAZOLE (FLAGYL) IVPB 500 mg     500 mg 100 mL/hr over 60 Minutes Intravenous Every 8 hours 03/16/14 2148     03/16/14 1415  metroNIDAZOLE (FLAGYL) IVPB 500 mg     500 mg 100 mL/hr over 60 Minutes Intravenous  Once 03/16/14 1413 03/16/14 1715   03/16/14 1415  ciprofloxacin (CIPRO) IVPB 400 mg     400 mg 200 mL/hr over 60 Minutes Intravenous  Once 03/16/14 1413 03/16/14 1623     Antibiotics Given (last 72 hours)   Date/Time Action Medication Dose Rate   03/16/14 2318 Given   metroNIDAZOLE (FLAGYL) IVPB 500 mg 500 mg 100 mL/hr   03/17/14 0217 Given   ciprofloxacin (CIPRO) IVPB 400 mg 400 mg 200 mL/hr      Studies  Filed Vitals:   03/16/14 1659 03/16/14 1742 03/16/14 2152 03/17/14 0537  BP: 121/83  125/85 127/85  Pulse: 91  78 93  Temp: 98.3 F (36.8 C)  98.4 F (36.9 C) 99 F (37.2 C)  TempSrc: Oral  Oral Oral  Resp: Height:   (1.6 m)    Weight:  90.719 kg (200 lb)    SpO2: 99%  96% 96%    Intake/Output Summary (Last 24 hours) at 03/17/14 0826 Last data filed at 03/17/14 8119  Gross per 24 hour  Intake 1801.67 ml  Output   3600 ml  Net -1798.33 ml   Filed Weights   03/16/14 1742  Weight: 90.719 kg (200 lb)    Exam:  General:  NAD  Cardiovascular: RRR  Respiratory: CTA biL  Abdomen: soft, mild tenderness throughout, no guarding  MSK: no edema  Data Reviewed: Basic Metabolic Panel:  Recent Labs Lab 03/13/14 1809 03/16/14 1327 03/17/14 0528  NA 136* 140 141  K 4.3 4.2 4.0  CL 99 102 105  CO2 GLUCOSE 86 89 106*  BUN 9 6 5*  CREATININE 0.64  0.71 0.58  CALCIUM 9.4 9.4 8.7   Liver Function Tests:  Recent Labs Lab 03/13/14 1809 03/16/14 1327  AST 69* 69*  ALT 78* 82*  ALKPHOS 76 67  BILITOT 0.3 0.4  PROT 8.2 7.9  ALBUMIN 3.8 3.7    Recent Labs Lab 03/13/14 1809 03/16/14 1327  LIPASE 28 22   No results found for this basename: AMMONIA,  in the last 168 hours CBC:  Recent Labs Lab 03/13/14 1809 03/16/14 1327  WBC 8.5 6.7  NEUTROABS 5.9 4.4  HGB 14.6 14.3  HCT 43.4 42.0  MCV 87.5 86.2  PLT 334 279   Cardiac Enzymes: No results found for this basename: CKTOTAL, CKMB, CKMBINDEX, TROPONINI,  in the last 168 hours BNP (last 3 results)  Recent Labs  01/06/14 2042  PROBNP 141.3*   CBG: No results found for this basename: GLUCAP,  in the last 168 hours  Recent Results (from the past 240 hour(s))  WET PREP,  GENITAL     Status: Abnormal   Collection Time    03/13/14  6:08 PM      Result Value Ref Range Status   Yeast Wet Prep HPF POC NONE SEEN  NONE SEEN Final   Trich, Wet Prep NONE SEEN  NONE SEEN Final   Clue Cells Wet Prep HPF POC NONE SEEN  NONE SEEN Final   WBC, Wet Prep HPF POC FEW (*) NONE SEEN Final  GC/CHLAMYDIA PROBE AMP     Status: None   Collection Time    03/13/14  6:08 PM      Result Value Ref Range Status   CT Probe RNA NEGATIVE  NEGATIVE Final   GC Probe RNA NEGATIVE  NEGATIVE Final   Comment: (NOTE)                                                                                               **Normal Reference Range: Negative**          Assay performed using the Gen-Probe APTIMA COMBO2 (R) Assay.     Acceptable specimen types for this assay include APTIMA Swabs (Unisex,     endocervical, urethral, or vaginal), first void urine, and ThinPrep     liquid based cytology samples.     Performed at [facility]     Studies: Dg Abd Acute W/chest  03/16/2014   CLINICAL DATA:  Abdominal pain  EXAM: ACUTE ABDOMEN SERIES (ABDOMEN 2 VIEW & CHEST 1 VIEW)  COMPARISON:  None.  FINDINGS: There is no evidence of dilated bowel loops or free intraperitoneal air. Scattered fecal material is noted throughout the colon. No radiopaque calculi or other significant radiographic abnormality is seen. Heart size and mediastinal contours are within normal limits. Both lungs are clear.  IMPRESSION: No acute abnormality is noted.   Electronically Signed   By: Alcide Clever M.D.   On: 03/16/2014 14:45    Scheduled Meds: . ciprofloxacin  400 mg Intravenous Q12H  . enoxaparin (LOVENOX) injection  40 mg Subcutaneous Q24H  . LORazepam  1 mg Oral QID  . metronidazole  500 mg Intravenous Q8H  .  nicotine  14 mg Transdermal Daily  . ondansetron  4 mg Oral 4 times per day   Or  . ondansetron (ZOFRAN) IV  4 mg Intravenous 4 times per day   Continuous Infusions: . dextrose 5 % and 0.9% NaCl 100 mL/hr  at 03/17/14 4098    Active Problems:   Colitis   Time spent: 25  This note has been created with Education officer, environmental. Any transcriptional errors are unintentional.   Pamella Pert, MD [facility] Pager [phone]. If 7 PM - 7 AM, please contact night-coverage at [email or web address], password [facility] 03/17/2014, 8:26 AM  LOS: 1 day

## 2014-03-17 NOTE — Progress Notes (Signed)
Pt reported that she began to vomit after taking her morning medications.  Pt given zofran.  Will continue to monitor for nausea relief.  Mila Palmer, RN 03/17/2014

## 2014-03-18 DIAGNOSIS — N898 Other specified noninflammatory disorders of vagina: Secondary | ICD-10-CM | POA: Diagnosis not present

## 2014-03-18 DIAGNOSIS — Z720 Tobacco use: Secondary | ICD-10-CM | POA: Diagnosis present

## 2014-03-18 DIAGNOSIS — K219 Gastro-esophageal reflux disease without esophagitis: Secondary | ICD-10-CM | POA: Diagnosis present

## 2014-03-18 DIAGNOSIS — F411 Generalized anxiety disorder: Secondary | ICD-10-CM | POA: Diagnosis present

## 2014-03-18 LAB — BASIC METABOLIC PANEL WITH GFR
Anion gap: 10 (ref 5–15)
BUN: 7 mg/dL (ref 6–23)
CO2: 26 meq/L (ref 19–32)
Calcium: 9.2 mg/dL (ref 8.4–10.5)
Chloride: 99 meq/L (ref 96–112)
Creatinine, Ser: 0.64 mg/dL (ref 0.50–1.10)
GFR calc Af Amer: >90 mL/min (ref >90)
GFR calc non Af Amer: >90 mL/min (ref >90)
Glucose, Bld: 84 mg/dL (ref 70–99)
Potassium: 4.3 meq/L (ref 3.7–5.3)
Sodium: 135 meq/L — ABNORMAL LOW (ref 137–147)

## 2014-03-18 LAB — CBC
HEMATOCRIT: 40.4 % (ref 36.0–46.0)
HEMOGLOBIN: 13.4 g/dL (ref 12.0–15.0)
MCH: 28.8 pg (ref 26.0–34.0)
MCHC: 33.2 g/dL (ref 30.0–36.0)
MCV: 86.9 fL (ref 78.0–100.0)
Platelets: 272 10*3/uL (ref 150–400)
RBC: 4.65 MIL/uL (ref 3.87–5.11)
RDW: 13.3 % (ref 11.5–15.5)
WBC: 6.2 10*3/uL (ref 4.0–10.5)

## 2014-03-18 MED ORDER — METRONIDAZOLE 500 MG PO TABS
500.0000 mg | ORAL_TABLET | Freq: Three times a day (TID) | ORAL | Status: DC
  Administered 2014-03-18 – 2014-03-19 (×3): 500 mg via ORAL
  Filled 2014-03-18 (×6): qty 1

## 2014-03-18 MED ORDER — RANITIDINE HCL 150 MG/10ML PO SYRP
150.0000 mg | ORAL_SOLUTION | Freq: Two times a day (BID) | ORAL | Status: DC
  Administered 2014-03-18 – 2014-03-19 (×3): 150 mg via ORAL
  Filled 2014-03-18 (×4): qty 10

## 2014-03-18 MED ORDER — CIPROFLOXACIN HCL 500 MG PO TABS
500.0000 mg | ORAL_TABLET | Freq: Two times a day (BID) | ORAL | Status: DC
  Filled 2014-03-18 (×2): qty 1

## 2014-03-18 MED ORDER — CIPROFLOXACIN HCL 500 MG PO TABS
500.0000 mg | ORAL_TABLET | Freq: Two times a day (BID) | ORAL | Status: DC
  Administered 2014-03-18 – 2014-03-19 (×2): 500 mg via ORAL
  Filled 2014-03-18 (×4): qty 1

## 2014-03-18 MED ORDER — OXYCODONE HCL 5 MG PO TABS
10.0000 mg | ORAL_TABLET | ORAL | Status: DC | PRN
  Administered 2014-03-18 – 2014-03-19 (×7): 10 mg via ORAL
  Filled 2014-03-18 (×7): qty 2

## 2014-03-18 MED ORDER — FLUCONAZOLE 150 MG PO TABS
150.0000 mg | ORAL_TABLET | Freq: Once | ORAL | Status: AC
  Administered 2014-03-18: 150 mg via ORAL
  Filled 2014-03-18: qty 1

## 2014-03-18 NOTE — Progress Notes (Signed)
PROGRESS NOTE  [name] HYQ:[id number] DOB: 08/03/1975 DOA: 03/16/2014 PCP: Aida Puffer, MD  HPI: [name] is a 38 y.o. female who began to have abdominal pain 3-4 days ago along with fevers, vomiting, diarrhea. She underwent a CT of the abdomen which revealed colitis and was discharged home with Flagyl and Cipro. However, due to ongoing vomiting, she was not able to keep down the medications and continued to have the symptoms and therefore returned today. She states her BMs are every 2-3 hrs and loose. No hematemesis or hematochezia noted. She had chicken prior to getting sick but her family had the same chicken and did not have any symptoms.   Subjective/ 24 H Interval events - no more diarrhea overnight, has abdominal pain  Assessment/Plan: Colitis of descendig and recto-sigmoid colon - awaiting stool studies, including C diff, patient without BM - feels about the same this morning, will try to eat, asks for solid food, will advance Anxiety - stable Smoker - nicotine patch Vaginal discharge - Diflucan x 1. Likely due to Abx GERD - pretty significant per patient, hold off PPI until C diff is back, did not do well with Maalox, will try Ranitidine today  Diet: full liquid Fluids: D5 1/2 NS DVT Prophylaxis: Lovenox  Code Status: Full Family Communication: d/w husband bedside  Disposition Plan: inpatient, home when ready   Consultants:  None   Procedures:  None    Antibiotics Ciprofloxacin 8/29 >> Metronidazole 8/29 >>  Studies  Filed Vitals:   03/17/14 0537 03/17/14 1430 03/17/14 2216 03/18/14 0600  BP: 127/85 126/83 132/86 128/82  Pulse: 93 93 89 84  Temp: 99 F (37.2 C) 98.2 F (36.8 C) 98.6 F (37 C) 98.7 F (37.1 C)  TempSrc: Oral Oral Oral Oral  Resp: Height:      Weight:      SpO2: 96% 98% 96% 98%    Intake/Output Summary (Last 24 hours) at 03/18/14 0850 Last data filed at 03/18/14 0700  Gross per 24 hour  Intake    4220 ml  Output   2650 ml  Net   1570 ml   Filed Weights   03/16/14 1742  Weight: 90.719 kg (200 lb)   Exam:  General:  NAD  Cardiovascular: RRR  Respiratory: CTA biL  Abdomen: soft, mild tenderness throughout, no guarding  MSK: no edema  Data Reviewed: Basic Metabolic Panel:  Recent Labs Lab 03/13/14 1809 03/16/14 1327 03/17/14 0528 03/18/14 0459  NA 136* 140 141 135*  K 4.3 4.2 4.0 4.3  CL 99 102 105 99  CO2 GLUCOSE 86 89 106* 84  BUN 9 6 5* 7  CREATININE 0.64 0.71 0.58 0.64  CALCIUM 9.4 9.4 8.7 9.2   Liver Function Tests:  Recent Labs Lab 03/13/14 1809 03/16/14 1327  AST 69* 69*  ALT 78* 82*  ALKPHOS 76 67  BILITOT 0.3 0.4  PROT 8.2 7.9  ALBUMIN 3.8 3.7    Recent Labs Lab 03/13/14 1809 03/16/14 1327  LIPASE 28 22   No results found for this basename: AMMONIA,  in the last 168 hours CBC:  Recent Labs Lab 03/13/14 1809 03/16/14 1327 03/18/14 0459  WBC 8.5 6.7 6.2  NEUTROABS 5.9 4.4  --   HGB 14.6 14.3 13.4  HCT 43.4 42.0 40.4  MCV 87.5 86.2 86.9  PLT 334 279 272   Cardiac Enzymes: No results found for this basename: CKTOTAL, CKMB, CKMBINDEX, TROPONINI,  in the last 168 hours BNP (last 3 results)  Recent Labs  01/06/14 2042  PROBNP 141.3*   CBG: No results found for this basename: GLUCAP,  in the last 168 hours  Recent Results (from the past 240 hour(s))  WET PREP, GENITAL     Status: Abnormal   Collection Time    03/13/14  6:08 PM      Result Value Ref Range Status   Yeast Wet Prep HPF POC NONE SEEN  NONE SEEN Final   Trich, Wet Prep NONE SEEN  NONE SEEN Final   Clue Cells Wet Prep HPF POC NONE SEEN  NONE SEEN Final   WBC, Wet Prep HPF POC FEW (*) NONE SEEN Final  GC/CHLAMYDIA PROBE AMP     Status: None   Collection Time    03/13/14  6:08 PM      Result Value Ref Range Status   CT Probe RNA NEGATIVE  NEGATIVE Final   GC Probe RNA NEGATIVE  NEGATIVE Final   Comment: (NOTE)                                                                                                **Normal Reference Range: Negative**          Assay performed using the Gen-Probe APTIMA COMBO2 (R) Assay.     Acceptable specimen types for this assay include APTIMA Swabs (Unisex,     endocervical, urethral, or vaginal), first void urine, and ThinPrep     liquid based cytology samples.     Performed at [facility]     Studies: Dg Abd Portable 2v  03/17/2014   CLINICAL DATA:  Pain.  EXAM: PORTABLE ABDOMEN - 2 VIEW  COMPARISON:  Abdominal series 8/29 2015.  FINDINGS: Soft tissue structures are unremarkable. Surgical clips right upper quadrant. Contrast is noted within the colon. There is no bowel distention. No free air. Pelvic phleboliths. No acute bony abnormality.  IMPRESSION: No significant abnormality identified.  No bowel distention.   Electronically Signed   By: Maisie Fus  Register   On: 03/17/2014 19:00    Scheduled Meds: . ciprofloxacin  400 mg Intravenous Q12H  . enoxaparin (LOVENOX) injection  40 mg Subcutaneous Q24H  . fluconazole  150 mg Oral Once  . LORazepam  1 mg Oral QID  . metronidazole  500 mg Intravenous Q8H  . nicotine  14 mg Transdermal Daily  . ondansetron  4 mg Oral 4 times per day   Or  . ondansetron (ZOFRAN) IV  4 mg Intravenous 4 times per day  . ranitidine  150 mg Oral BID   Continuous Infusions: . dextrose 5 % and 0.9% NaCl 100 mL/hr at 03/18/14 0608    Active Problems:   Colitis   GERD (gastroesophageal reflux disease)   Vaginal discharge   Tobacco abuse   Anxiety state, unspecified   Time spent: 25  This note has been created with Education officer, environmental. Any transcriptional errors are unintentional.   Pamella Pert, MD [facility] Pager [phone]. If 7 PM - 7 AM, please contact night-coverage  at [email or web address], password [facility] 03/18/2014, 8:50 AM  LOS: 2 days

## 2014-03-19 LAB — GC/CHLAMYDIA PROBE AMP
CT Probe RNA: NEGATIVE
GC Probe RNA: NEGATIVE

## 2014-03-19 MED ORDER — ZOLPIDEM TARTRATE 10 MG PO TABS: 10.0000 mg | ORAL_TABLET | Freq: Every evening | ORAL | Status: DC | PRN

## 2014-03-19 MED ORDER — CIPROFLOXACIN HCL 500 MG PO TABS: 500.0000 mg | ORAL_TABLET | Freq: Two times a day (BID) | ORAL | Status: DC

## 2014-03-19 MED ORDER — METRONIDAZOLE 500 MG PO TABS: 500.0000 mg | ORAL_TABLET | Freq: Three times a day (TID) | ORAL | Status: DC

## 2014-03-19 MED ORDER — OXYCODONE HCL 10 MG PO TABS: 10.0000 mg | ORAL_TABLET | ORAL | Status: DC | PRN

## 2014-03-19 MED ORDER — LORAZEPAM 1 MG PO TABS: 1.0000 mg | ORAL_TABLET | Freq: Four times a day (QID) | ORAL | Status: DC

## 2014-03-19 NOTE — Discharge Instructions (Signed)

## 2014-03-19 NOTE — Progress Notes (Signed)
Discharge instructions and prescriptions are given to patient along with a bus pass for patient.  DC'd via wheelchair to front door by Claudie Leach Nurse [employer]

## 2014-03-19 NOTE — Discharge Summary (Signed)
Physician Discharge Summary  [name] JYN:[id number] DOB: 1975-12-23 DOA: 03/16/2014  PCP: Aida Puffer, MD  Admit date: 03/16/2014 Discharge date: 03/19/2014  Time spent: 35 minutes  Recommendations for Outpatient Follow-up:  1. Follow up with PCP in 1-2 weeks    Discharge Diagnoses:  Active Problems:   Colitis   GERD (gastroesophageal reflux disease)   Vaginal discharge   Tobacco abuse   Anxiety state, unspecified  Discharge Condition: stable  Diet recommendation: regular  Filed Weights   03/16/14 1742  Weight: 90.719 kg (200 lb)   History of present illness:  [name] is a 38 y.o. female who began to have abdominal pain 3-4 days ago along with fevers, vomiting, diarrhea. She underwent a CT of the abdomen which revealed colitis and was discharged home with Flagyl and Cipro. However, due to ongoing vomiting, she was not able to keep down the medications and continued to have the symptoms and therefore returned today. She states her BMs are every 2-3 hrs and loose. No hematemesis or hematochezia noted. She had chicken prior to getting sick but her family had the same chicken and did not have any symptoms.   Hospital Course:  Colitis of descendig and recto-sigmoid colon - her diarrhea has resolved upon admission and initiation of IV antibiotics. Her diet was advanced and she was able to tolerate a regular diet, able to tolerate po antibiotics and was discharged home in stable condition to complete 10 days of Ciprofloxacin and Metronidazole.  Anxiety - stable  Smoker - nicotine patch  Vaginal discharge - Diflucan x 1. Likely due to Abx  GERD - resume home meds  Procedures:  None    Consultations:  None   Discharge Exam: Filed Vitals:   03/18/14 0600 03/18/14 1324 03/18/14 2202 03/19/14 0548  BP: 128/82 125/86 126/74 117/93  Pulse: 84 89 90 102  Temp: 98.7 F (37.1 C) 99 F (37.2 C) 98.6 F (37 C) 98.9 F (37.2 C)  TempSrc: Oral Oral Oral Oral   Resp: Height:      Weight:      SpO2: 98% 99% 100% 100%   General: NAD Cardiovascular: RRR Respiratory: CTA biL  Discharge Instructions     Medication List    STOP taking these medications       oxyCODONE-acetaminophen 5-325 MG per tablet  Commonly known as:  PERCOCET/ROXICET      TAKE these medications       acetaminophen 500 MG tablet  Commonly known as:  TYLENOL  Take 1,000 mg by mouth every 6 (six) hours as needed for mild pain.     albuterol 108 (90 BASE) MCG/ACT inhaler  Commonly known as:  PROVENTIL HFA;VENTOLIN HFA  Inhale 2 puffs into the lungs every 4 (four) hours as needed for wheezing or shortness of breath. Ventolin 2 puff every 12 hours and proair 2 puffs every 4 hours     aspirin-acetaminophen-caffeine 250-250-65 MG per tablet  Commonly known as:  EXCEDRIN MIGRAINE  Take 2 tablets by mouth daily as needed for headache.     ciprofloxacin 500 MG tablet  Commonly known as:  CIPRO  Take 1 tablet (500 mg total) by mouth 2 (two) times daily.     ibuprofen 200 MG tablet  Commonly known as:  ADVIL,MOTRIN  Take 600 mg by mouth every 6 (six) hours as needed for moderate pain.     LORazepam 1 MG tablet  Commonly known as:  ATIVAN  Take 1  tablet (1 mg total) by mouth 4 (four) times daily.     metroNIDAZOLE 500 MG tablet  Commonly known as:  FLAGYL  Take 1 tablet (500 mg total) by mouth every 8 (eight) hours.     omeprazole 40 MG capsule  Commonly known as:  PRILOSEC  Take 1 capsule (40 mg total) by mouth 2 (two) times daily. For acid reflux     ondansetron 4 MG tablet  Commonly known as:  ZOFRAN  Take 1 tablet (4 mg total) by mouth every 8 (eight) hours as needed for nausea or vomiting.     Oxycodone HCl 10 MG Tabs  Take 1 tablet (10 mg total) by mouth every 3 (three) hours as needed for severe pain.     zolpidem 10 MG tablet  Commonly known as:  AMBIEN  Take 1 tablet (10 mg total) by mouth at bedtime as needed for sleep.            Follow-up Information   Follow up with LITTLE,JAMES, MD. Schedule an appointment as soon as possible for a visit in 1 week.   Specialty:  Family Medicine   Contact information:   [address] [place] [address] [phone]       The results of significant diagnostics from this hospitalization (including imaging, microbiology, ancillary and laboratory) are listed below for reference.    Significant Diagnostic Studies: Ct Abdomen Pelvis Wo Contrast  02/18/2014   CLINICAL DATA:  Lower back pain and dysuria  EXAM: CT ABDOMEN AND PELVIS WITHOUT CONTRAST  TECHNIQUE: Multidetector CT imaging of the abdomen and pelvis was performed following the standard protocol without IV contrast.  COMPARISON:  Prior CT scan 01/03/2014 ; 08/23/2012  FINDINGS: Lower Chest: Stable 3 mm subpleural nodule in the periphery of the left lower lobe dating back to October of 2014. Triangular subpleural lymph node in the periphery of the right lower lobe is also unchanged dating back to 2014. Trace dependent atelectasis in the lower lobes. Otherwise, the lung bases are clear. Visualized cardiac structures within normal limits for size. Unremarkable visualized distal thoracic esophagus.  Abdomen: Unenhanced CT was performed per clinician order. Lack of IV contrast limits sensitivity and specificity, especially for evaluation of abdominal/pelvic solid viscera. Within these limitations, unremarkable CT appearance of the stomach, duodenum, spleen, adrenal glands and pancreas. Normal hepatic contour and morphology. No discrete hepatic lesion. The gallbladder is surgically absent. No intra or extrahepatic biliary ductal dilatation.  Tiny punctate nonobstructing stones bilaterally. Stones measure 1-2 mm. There are 3 stones in each kidney. No evidence of hydronephrosis. No ureteral stones identified.  No evidence of obstruction or focal bowel wall thickening. Normal appendix in the right lower quadrant. The terminal ileum is  unremarkable. No free fluid or suspicious adenopathy.  Pelvis: Prominent right ovary. Evaluation is limited in the absence of intravenous contrast. Unremarkable appearance of the left ovary, uterus and bladder. Trace free fluid is likely physiologic.  Bones/Soft Tissues: No acute fracture or aggressive appearing lytic or blastic osseous lesion.  Vascular: Limited evaluation in the absence of intravenous contrast. No focal abnormality.  IMPRESSION: 1. Nonobstructing punctate (1-2 mm) nephrolithiasis bilaterally. 2. No visible ureteral stone or evidence of hydronephrosis. 3. Interval enlargement of the right ovary. Suspect underlying ovarian cyst. This could represent a source for the patient's lower pelvic pain. 4. Surgical changes of prior cholecystectomy. 5. Stable left lower lobe pulmonary nodule.   Electronically Signed   By: Malachy Moan M.D.   On: 02/18/2014  15:03   US Transvaginal Non-ob  03/13/2014   CLINICAL DATA:  Right pelvic pain.  Fever.  Dysuria.  EXAM: TRANSABDOMINAL AND TRANSVAGINAL ULTRASOUND OF PELVIS  DOPPLER ULTRASOUND OF OVARIES  TECHNIQUE: Both transabdominal and transvaginal ultrasound examinations of the pelvis were performed. Transabdominal technique was performed for global imaging of the pelvis including uterus, ovaries, adnexal regions, and pelvic cul-de-sac.  It was necessary to proceed with endovaginal exam following the transabdominal exam to visualize the endometrium. Color and duplex Doppler ultrasound was utilized to evaluate blood flow to the ovaries.  COMPARISON:  None.  FINDINGS: Uterus  Measurements: 8.5 x 4.9 x 4.1 cm. No fibroids or other mass visualized.  Endometrium  Thickness: 4.8 mm.  No focal abnormality visualized.  Right ovary  Measurements: 3.6 x 2.5 x 2.2 cm. Normal appearance/no adnexal mass.  Left ovary  Measurements: 3.1 x 2.2 x 1.8 cm. Normal appearance/no adnexal mass.  Pulsed Doppler evaluation of both ovaries demonstrates normal low-resistance arterial  and venous waveforms.  Other findings  No free fluid.  IMPRESSION: Normal examination.   Electronically Signed   By: Gordan Payment M.D.   On: 03/13/2014 19:18   US Pelvis Complete  03/13/2014   CLINICAL DATA:  Right pelvic pain.  Fever.  Dysuria.  EXAM: TRANSABDOMINAL AND TRANSVAGINAL ULTRASOUND OF PELVIS  DOPPLER ULTRASOUND OF OVARIES  TECHNIQUE: Both transabdominal and transvaginal ultrasound examinations of the pelvis were performed. Transabdominal technique was performed for global imaging of the pelvis including uterus, ovaries, adnexal regions, and pelvic cul-de-sac.  It was necessary to proceed with endovaginal exam following the transabdominal exam to visualize the endometrium. Color and duplex Doppler ultrasound was utilized to evaluate blood flow to the ovaries.  COMPARISON:  None.  FINDINGS: Uterus  Measurements: 8.5 x 4.9 x 4.1 cm. No fibroids or other mass visualized.  Endometrium  Thickness: 4.8 mm.  No focal abnormality visualized.  Right ovary  Measurements: 3.6 x 2.5 x 2.2 cm. Normal appearance/no adnexal mass.  Left ovary  Measurements: 3.1 x 2.2 x 1.8 cm. Normal appearance/no adnexal mass.  Pulsed Doppler evaluation of both ovaries demonstrates normal low-resistance arterial and venous waveforms.  Other findings  No free fluid.  IMPRESSION: Normal examination.   Electronically Signed   By: Gordan Payment M.D.   On: 03/13/2014 19:18   Ct Abdomen Pelvis W Contrast  03/13/2014   CLINICAL DATA:  Right lower quadrant pain since yesterday with burning on urination. Fever, nausea, diarrhea.  EXAM: CT ABDOMEN AND PELVIS WITH CONTRAST  TECHNIQUE: Multidetector CT imaging of the abdomen and pelvis was performed using the standard protocol following bolus administration of intravenous contrast.  CONTRAST:  50mL OMNIPAQUE IOHEXOL 300 MG/ML SOLN, OMNIPAQUE IOHEXOL 300 MG/ML SOLN  COMPARISON:  02/18/2014  FINDINGS: Dependent atelectasis in the lung bases. Tiny bilateral subpleural nodules measuring less  than 4 mm, stable since previous study.  Surgical absence of the gallbladder. 16 mm poorly defined low-attenuation lesion is demonstrated at the base of the caudate lobe of the liver. This lesion was present on previous study 10/04/2006 without significant interval change. The pancreas, spleen, adrenal glands, abdominal aorta, inferior vena cava, and retroperitoneal lymph nodes are unremarkable. Stomach and small bowel are decompressed. Stool-filled colon without distention. No free air or free fluid in the abdomen. Multiple tiny punctate size stones in both kidneys. Nephrograms are symmetrical and there is no evidence of hydronephrosis or hydroureter.  Pelvis: The rectosigmoid and descending colon is nondistended but appears to demonstrate some wall  thickening. This could represent colitis of indeterminate etiology. Consider inflammatory or infectious etiologies. This was not present previously. Uterus and ovaries are not enlarged. No pelvic mass or lymphadenopathy. No free or loculated pelvic fluid collections. Bladder wall is not thickened. Appendix is normal. No destructive bone lesions.  IMPRESSION: Tiny nonobstructing intrarenal calculi bilaterally. Stable appearance of low-attenuation lesion in the liver. Suggestion of wall thickening of the descending and rectosigmoid colon suggesting nonspecific colitis.   Electronically Signed   By: Burman Nieves M.D.   On: 03/13/2014 22:58   [place] Art/ven Flow Abd Pelv Doppler  03/13/2014   CLINICAL DATA:  Right pelvic pain.  Fever.  Dysuria.  EXAM: TRANSABDOMINAL AND TRANSVAGINAL ULTRASOUND OF PELVIS  DOPPLER ULTRASOUND OF OVARIES  TECHNIQUE: Both transabdominal and transvaginal ultrasound examinations of the pelvis were performed. Transabdominal technique was performed for global imaging of the pelvis including uterus, ovaries, adnexal regions, and pelvic cul-de-sac.  It was necessary to proceed with endovaginal exam following the transabdominal exam to visualize the  endometrium. Color and duplex Doppler ultrasound was utilized to evaluate blood flow to the ovaries.  COMPARISON:  None.  FINDINGS: Uterus  Measurements: 8.5 x 4.9 x 4.1 cm. No fibroids or other mass visualized.  Endometrium  Thickness: 4.8 mm.  No focal abnormality visualized.  Right ovary  Measurements: 3.6 x 2.5 x 2.2 cm. Normal appearance/no adnexal mass.  Left ovary  Measurements: 3.1 x 2.2 x 1.8 cm. Normal appearance/no adnexal mass.  Pulsed Doppler evaluation of both ovaries demonstrates normal low-resistance arterial and venous waveforms.  Other findings  No free fluid.  IMPRESSION: Normal examination.   Electronically Signed   By: Gordan Payment M.D.   On: 03/13/2014 19:18   Dg Abd Acute W/chest  03/16/2014   CLINICAL DATA:  Abdominal pain  EXAM: ACUTE ABDOMEN SERIES (ABDOMEN 2 VIEW & CHEST 1 VIEW)  COMPARISON:  None.  FINDINGS: There is no evidence of dilated bowel loops or free intraperitoneal air. Scattered fecal material is noted throughout the colon. No radiopaque calculi or other significant radiographic abnormality is seen. Heart size and mediastinal contours are within normal limits. Both lungs are clear.  IMPRESSION: No acute abnormality is noted.   Electronically Signed   By: Alcide Clever M.D.   On: 03/16/2014 14:45   Dg Abd Portable 2v  03/17/2014   CLINICAL DATA:  Pain.  EXAM: PORTABLE ABDOMEN - 2 VIEW  COMPARISON:  Abdominal series 8/29 2015.  FINDINGS: Soft tissue structures are unremarkable. Surgical clips right upper quadrant. Contrast is noted within the colon. There is no bowel distention. No free air. Pelvic phleboliths. No acute bony abnormality.  IMPRESSION: No significant abnormality identified.  No bowel distention.   Electronically Signed   By: Maisie Fus  Register   On: 03/17/2014 19:00    Microbiology: Recent Results (from the past 240 hour(s))  WET PREP, GENITAL     Status: Abnormal   Collection Time    03/13/14  6:08 PM      Result Value Ref Range Status   Yeast Wet Prep  HPF POC NONE SEEN  NONE SEEN Final   Trich, Wet Prep NONE SEEN  NONE SEEN Final   Clue Cells Wet Prep HPF POC NONE SEEN  NONE SEEN Final   WBC, Wet Prep HPF POC FEW (*) NONE SEEN Final  GC/CHLAMYDIA PROBE AMP     Status: None   Collection Time    03/13/14  6:08 PM      Result Value Ref Range  Status   CT Probe RNA NEGATIVE  NEGATIVE Final   GC Probe RNA NEGATIVE  NEGATIVE Final   Comment: (NOTE)                                                                                               **Normal Reference Range: Negative**          Assay performed using the Gen-Probe APTIMA COMBO2 (R) Assay.     Acceptable specimen types for this assay include APTIMA Swabs (Unisex,     endocervical, urethral, or vaginal), first void urine, and ThinPrep     liquid based cytology samples.     Performed at [facility]  GC/CHLAMYDIA PROBE AMP     Status: None   Collection Time    03/17/14 12:00 AM      Result Value Ref Range Status   CT Probe RNA NEGATIVE  NEGATIVE Final   GC Probe RNA NEGATIVE  NEGATIVE Final   Comment: (NOTE)                                                                                               **Normal Reference Range: Negative**          Assay performed using the Gen-Probe APTIMA COMBO2 (R) Assay.     Acceptable specimen types for this assay include APTIMA Swabs (Unisex,     endocervical, urethral, or vaginal), first void urine, and ThinPrep     liquid based cytology samples.     Performed at [facility]: Basic Metabolic Panel:  Recent Labs Lab 03/13/14 1809 03/16/14 1327 03/17/14 0528 03/18/14 0459  NA 136* 140 141 135*  K 4.3 4.2 4.0 4.3  CL 99 102 105 99  CO2 24 25 24 26   GLUCOSE 86 89 106* 84  BUN 9 6 5* 7  CREATININE 0.64 0.71 0.58 0.64  CALCIUM 9.4 9.4 8.7 9.2   Liver Function Tests:  Recent Labs Lab 03/13/14 1809 03/16/14 1327  AST 69* 69*  ALT 78* 82*  ALKPHOS 76 67  BILITOT 0.3 0.4  PROT 8.2 7.9  ALBUMIN 3.8 3.7     Recent Labs Lab 03/13/14 1809 03/16/14 1327  LIPASE 28 22   CBC:  Recent Labs Lab 03/13/14 1809 03/16/14 1327 03/18/14 0459  WBC 8.5 6.7 6.2  NEUTROABS 5.9 4.4  --   HGB 14.6 14.3 13.4  HCT 43.4 42.0 40.4  MCV 87.5 86.2 86.9  PLT 334 279 272   BNP: BNP (last 3 results)  Recent Labs  01/06/14 2042  PROBNP 141.3*   Signed:  GHERGHE, COSTIN  [facility] 03/19/2014, 11:22 AM

## 2014-04-05 ENCOUNTER — Encounter (HOSPITAL_COMMUNITY): Payer: Self-pay | Admitting: Emergency Medicine

## 2014-04-05 DIAGNOSIS — F17200 Nicotine dependence, unspecified, uncomplicated: Secondary | ICD-10-CM | POA: Insufficient documentation

## 2014-04-05 DIAGNOSIS — K219 Gastro-esophageal reflux disease without esophagitis: Secondary | ICD-10-CM | POA: Diagnosis not present

## 2014-04-05 DIAGNOSIS — Z8709 Personal history of other diseases of the respiratory system: Secondary | ICD-10-CM | POA: Diagnosis not present

## 2014-04-05 DIAGNOSIS — Z8639 Personal history of other endocrine, nutritional and metabolic disease: Secondary | ICD-10-CM | POA: Insufficient documentation

## 2014-04-05 DIAGNOSIS — Z9089 Acquired absence of other organs: Secondary | ICD-10-CM | POA: Insufficient documentation

## 2014-04-05 DIAGNOSIS — Z791 Long term (current) use of non-steroidal anti-inflammatories (NSAID): Secondary | ICD-10-CM | POA: Insufficient documentation

## 2014-04-05 DIAGNOSIS — Z792 Long term (current) use of antibiotics: Secondary | ICD-10-CM | POA: Insufficient documentation

## 2014-04-05 DIAGNOSIS — Z87442 Personal history of urinary calculi: Secondary | ICD-10-CM | POA: Diagnosis not present

## 2014-04-05 DIAGNOSIS — Z79899 Other long term (current) drug therapy: Secondary | ICD-10-CM | POA: Insufficient documentation

## 2014-04-05 DIAGNOSIS — Z862 Personal history of diseases of the blood and blood-forming organs and certain disorders involving the immune mechanism: Secondary | ICD-10-CM | POA: Insufficient documentation

## 2014-04-05 DIAGNOSIS — R1031 Right lower quadrant pain: Secondary | ICD-10-CM | POA: Insufficient documentation

## 2014-04-05 DIAGNOSIS — Z3202 Encounter for pregnancy test, result negative: Secondary | ICD-10-CM | POA: Diagnosis not present

## 2014-04-05 DIAGNOSIS — Z87440 Personal history of urinary (tract) infections: Secondary | ICD-10-CM | POA: Diagnosis not present

## 2014-04-05 DIAGNOSIS — F411 Generalized anxiety disorder: Secondary | ICD-10-CM | POA: Diagnosis not present

## 2014-04-05 DIAGNOSIS — R110 Nausea: Secondary | ICD-10-CM | POA: Insufficient documentation

## 2014-04-05 DIAGNOSIS — N3090 Cystitis, unspecified without hematuria: Secondary | ICD-10-CM | POA: Diagnosis not present

## 2014-04-05 LAB — URINALYSIS, ROUTINE W REFLEX MICROSCOPIC
Bilirubin Urine: NEGATIVE
Glucose, UA: NEGATIVE mg/dL
KETONES UR: NEGATIVE mg/dL
NITRITE: NEGATIVE
PROTEIN: NEGATIVE mg/dL
Specific Gravity, Urine: 1.015 (ref 1.005–1.030)
UROBILINOGEN UA: 1 mg/dL (ref 0.0–1.0)
pH: 6 (ref 5.0–8.0)

## 2014-04-05 LAB — URINE MICROSCOPIC-ADD ON

## 2014-04-05 LAB — PREGNANCY, URINE: Preg Test, Ur: NEGATIVE

## 2014-04-05 NOTE — ED Notes (Signed)
The pt is c/o rlq pain  For 2 days with chills and a temp.  No urinary symptoms.  lmp sept 5

## 2014-04-06 ENCOUNTER — Emergency Department (EMERGENCY_DEPARTMENT_HOSPITAL)
Admission: EM | Admit: 2014-04-06 | Discharge: 2014-04-07 | Disposition: A | Discharge status: Other Acute Inpatient Hospital | Payer: Medicaid Other | Source: Home / Self Care

## 2014-04-06 ENCOUNTER — Encounter (HOSPITAL_COMMUNITY): Payer: Self-pay | Admitting: Emergency Medicine

## 2014-04-06 ENCOUNTER — Emergency Department (HOSPITAL_COMMUNITY)
Admission: EM | Admit: 2014-04-06 | Discharge: 2014-04-06 | Disposition: A | Discharge status: Home / Self Care | Payer: Medicaid Other | Attending: Emergency Medicine | Admitting: Emergency Medicine

## 2014-04-06 ENCOUNTER — Emergency Department (HOSPITAL_COMMUNITY): Payer: Medicaid Other

## 2014-04-06 DIAGNOSIS — Z87448 Personal history of other diseases of urinary system: Secondary | ICD-10-CM

## 2014-04-06 DIAGNOSIS — N3090 Cystitis, unspecified without hematuria: Secondary | ICD-10-CM

## 2014-04-06 DIAGNOSIS — Z87440 Personal history of urinary (tract) infections: Secondary | ICD-10-CM | POA: Insufficient documentation

## 2014-04-06 DIAGNOSIS — F411 Generalized anxiety disorder: Secondary | ICD-10-CM

## 2014-04-06 DIAGNOSIS — Z8709 Personal history of other diseases of the respiratory system: Secondary | ICD-10-CM | POA: Insufficient documentation

## 2014-04-06 DIAGNOSIS — R45851 Suicidal ideations: Secondary | ICD-10-CM | POA: Insufficient documentation

## 2014-04-06 DIAGNOSIS — F1110 Opioid abuse, uncomplicated: Secondary | ICD-10-CM

## 2014-04-06 DIAGNOSIS — K219 Gastro-esophageal reflux disease without esophagitis: Secondary | ICD-10-CM | POA: Insufficient documentation

## 2014-04-06 DIAGNOSIS — Z87442 Personal history of urinary calculi: Secondary | ICD-10-CM | POA: Insufficient documentation

## 2014-04-06 DIAGNOSIS — Z79899 Other long term (current) drug therapy: Secondary | ICD-10-CM

## 2014-04-06 DIAGNOSIS — E785 Hyperlipidemia, unspecified: Secondary | ICD-10-CM

## 2014-04-06 DIAGNOSIS — F17200 Nicotine dependence, unspecified, uncomplicated: Secondary | ICD-10-CM | POA: Insufficient documentation

## 2014-04-06 DIAGNOSIS — F332 Major depressive disorder, recurrent severe without psychotic features: Secondary | ICD-10-CM | POA: Diagnosis present

## 2014-04-06 DIAGNOSIS — F1310 Sedative, hypnotic or anxiolytic abuse, uncomplicated: Secondary | ICD-10-CM | POA: Insufficient documentation

## 2014-04-06 DIAGNOSIS — Z8742 Personal history of other diseases of the female genital tract: Secondary | ICD-10-CM | POA: Insufficient documentation

## 2014-04-06 DIAGNOSIS — F331 Major depressive disorder, recurrent, moderate: Secondary | ICD-10-CM

## 2014-04-06 DIAGNOSIS — F319 Bipolar disorder, unspecified: Secondary | ICD-10-CM | POA: Insufficient documentation

## 2014-04-06 LAB — CBC
HCT: 37.3 % (ref 36.0–46.0)
Hemoglobin: 13 g/dL (ref 12.0–15.0)
MCH: 29.5 pg (ref 26.0–34.0)
MCHC: 34.9 g/dL (ref 30.0–36.0)
MCV: 84.8 fL (ref 78.0–100.0)
PLATELETS: 360 10*3/uL (ref 150–400)
RBC: 4.4 MIL/uL (ref 3.87–5.11)
RDW: 13.1 % (ref 11.5–15.5)
WBC: 7.6 10*3/uL (ref 4.0–10.5)

## 2014-04-06 LAB — COMPREHENSIVE METABOLIC PANEL
ALT: 48 U/L — AB (ref 0–35)
ALT: 49 U/L — ABNORMAL HIGH (ref 0–35)
AST: 36 U/L (ref 0–37)
AST: 38 U/L — AB (ref 0–37)
Albumin: 3.6 g/dL (ref 3.5–5.2)
Albumin: 3.6 g/dL (ref 3.5–5.2)
Alkaline Phosphatase: 62 U/L (ref 39–117)
Alkaline Phosphatase: 64 U/L (ref 39–117)
Anion gap: 11 (ref 5–15)
Anion gap: 13 (ref 5–15)
BILIRUBIN TOTAL: 0.3 mg/dL (ref 0.3–1.2)
BUN: 10 mg/dL (ref 6–23)
BUN: 7 mg/dL (ref 6–23)
CALCIUM: 8.6 mg/dL (ref 8.4–10.5)
CHLORIDE: 104 meq/L (ref 96–112)
CO2: 24 mEq/L (ref 19–32)
CO2: 24 mEq/L (ref 19–32)
Calcium: 9.1 mg/dL (ref 8.4–10.5)
Chloride: 104 mEq/L (ref 96–112)
Creatinine, Ser: 0.55 mg/dL (ref 0.50–1.10)
Creatinine, Ser: 0.55 mg/dL (ref 0.50–1.10)
GFR calc Af Amer: >90 mL/min (ref >90)
GFR calc non Af Amer: >90 mL/min (ref >90)
Glucose, Bld: 94 mg/dL (ref 70–99)
Glucose, Bld: 97 mg/dL (ref 70–99)
Potassium: 3.7 mEq/L (ref 3.7–5.3)
Potassium: 3.7 mEq/L (ref 3.7–5.3)
SODIUM: 139 meq/L (ref 137–147)
SODIUM: 141 meq/L (ref 137–147)
Total Bilirubin: 0.3 mg/dL (ref 0.3–1.2)
Total Protein: 7.6 g/dL (ref 6.0–8.3)
Total Protein: 7.7 g/dL (ref 6.0–8.3)

## 2014-04-06 LAB — CBC WITH DIFFERENTIAL/PLATELET
BASOS ABS: 0 10*3/uL (ref 0.0–0.1)
Basophils Relative: 0 % (ref 0–1)
Eosinophils Absolute: 0.3 10*3/uL (ref 0.0–0.7)
Eosinophils Relative: 3 % (ref 0–5)
HEMATOCRIT: 41.3 % (ref 36.0–46.0)
Hemoglobin: 14.4 g/dL (ref 12.0–15.0)
LYMPHS PCT: 38 % (ref 12–46)
Lymphs Abs: 3 10*3/uL (ref 0.7–4.0)
MCH: 30 pg (ref 26.0–34.0)
MCHC: 34.9 g/dL (ref 30.0–36.0)
MCV: 86 fL (ref 78.0–100.0)
MONO ABS: 0.7 10*3/uL (ref 0.1–1.0)
Monocytes Relative: 9 % (ref 3–12)
NEUTROS ABS: 4 10*3/uL (ref 1.7–7.7)
Neutrophils Relative %: 50 % (ref 43–77)
PLATELETS: 374 10*3/uL (ref 150–400)
RBC: 4.8 MIL/uL (ref 3.87–5.11)
RDW: 13.2 % (ref 11.5–15.5)
WBC: 8 10*3/uL (ref 4.0–10.5)

## 2014-04-06 LAB — ETHANOL: Alcohol, Ethyl (B): <11 mg/dL (ref 0–11)

## 2014-04-06 LAB — RAPID URINE DRUG SCREEN, HOSP PERFORMED
AMPHETAMINES: NOT DETECTED
BENZODIAZEPINES: POSITIVE — AB
Barbiturates: NOT DETECTED
COCAINE: NOT DETECTED
Opiates: POSITIVE — AB
Tetrahydrocannabinol: NOT DETECTED

## 2014-04-06 LAB — ACETAMINOPHEN LEVEL: Acetaminophen (Tylenol), Serum: <15 ug/mL (ref 10–30)

## 2014-04-06 LAB — LIPASE, BLOOD: Lipase: 35 U/L (ref 11–59)

## 2014-04-06 LAB — SALICYLATE LEVEL

## 2014-04-06 MED ORDER — IBUPROFEN 600 MG PO TABS: 600.0000 mg | ORAL_TABLET | Freq: Four times a day (QID) | ORAL | Status: DC | PRN

## 2014-04-06 MED ORDER — CEFTRIAXONE SODIUM 1 G IJ SOLR
1.0000 g | Freq: Once | INTRAMUSCULAR | Status: AC
Start: 2014-04-06 — End: 2014-04-06
  Administered 2014-04-06: 1 g via INTRAVENOUS
  Filled 2014-04-06: qty 10

## 2014-04-06 MED ORDER — MORPHINE SULFATE 4 MG/ML IJ SOLN
4.0000 mg | Freq: Once | INTRAMUSCULAR | Status: AC
  Administered 2014-04-06: 4 mg via INTRAVENOUS
  Filled 2014-04-06: qty 1

## 2014-04-06 MED ORDER — HYDROCODONE-ACETAMINOPHEN 5-325 MG PO TABS: 1.0000 | ORAL_TABLET | Freq: Four times a day (QID) | ORAL | Status: DC | PRN

## 2014-04-06 MED ORDER — ONDANSETRON HCL 4 MG/2ML IJ SOLN
4.0000 mg | Freq: Once | INTRAMUSCULAR | Status: AC
  Administered 2014-04-06: 4 mg via INTRAVENOUS
  Filled 2014-04-06: qty 2

## 2014-04-06 MED ORDER — IOHEXOL 300 MG/ML  SOLN
100.0000 mL | Freq: Once | INTRAMUSCULAR | Status: AC | PRN
  Administered 2014-04-06: 100 mL via INTRAVENOUS

## 2014-04-06 MED ORDER — CEPHALEXIN 500 MG PO CAPS: 500.0000 mg | ORAL_CAPSULE | Freq: Four times a day (QID) | ORAL | Status: DC

## 2014-04-06 MED ORDER — SODIUM CHLORIDE 0.9 % IV BOLUS (SEPSIS)
1000.0000 mL | Freq: Once | INTRAVENOUS | Status: AC
  Administered 2014-04-06: 1000 mL via INTRAVENOUS

## 2014-04-06 MED ORDER — CEPHALEXIN 500 MG PO CAPS: 500.0000 mg | ORAL_CAPSULE | Freq: Two times a day (BID) | ORAL | Status: DC

## 2014-04-06 MED ORDER — HYDROCODONE-ACETAMINOPHEN 5-325 MG PO TABS
1.0000 | ORAL_TABLET | Freq: Once | ORAL | Status: AC
  Administered 2014-04-06: 1 via ORAL
  Filled 2014-04-06: qty 1

## 2014-04-06 MED ORDER — HYDROMORPHONE HCL 1 MG/ML IJ SOLN
1.0000 mg | Freq: Once | INTRAMUSCULAR | Status: AC
  Administered 2014-04-06: 1 mg via INTRAVENOUS
  Filled 2014-04-06: qty 1
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
## 2014-04-06 NOTE — ED Notes (Signed)
Pt. and belongings wanded by security 
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
## 2014-04-06 NOTE — ED Notes (Signed)
IV attempted x 3 no success, IV team paged.

## 2014-04-06 NOTE — Discharge Instructions (Signed)
We saw you in the ER for the abdominal pain. All of our results are normal, including all labs and imaging. Kidney function is fine as well. There is evidence of bladder infection. See [facility] doctor as recommended. If your symptoms get worse, return to the ER. Take the pain meds and nausea meds as prescribed.   CT FINDINGS ARE AS BELOW - NEED TO SEE A PRIMARY CARE DOCTOR FOR THE NODULE.  IMPRESSION: Mildly thickened urinary bladder greater than expected for the degree distension could reflect cystitis, recommend correlation with urinary analysis. Punctate nonobstructing nephrolithiasis.  Normal appendix.  5 mm RIGHT lower lobe pulmonary nodule. If the patient is at high risk for bronchogenic carcinoma, follow-up chest CT at 6-12 months is recommended. If the patient is at low risk for bronchogenic carcinoma, follow-up chest CT at 12 months is recommended. This recommendation follows the consensus statement: Guidelines for Management of Small Pulmonary Nodules Detected on CT Scans: A Statement from the Fleischner Society as published in Radiology 2005;237:395-400.   Urinary Tract Infection A urinary tract infection (UTI) can occur any place along the urinary tract. The tract includes the kidneys, ureters, bladder, and urethra. A type of germ called bacteria often causes a UTI. UTIs are often helped with antibiotic medicine.  HOME CARE   If given, take antibiotics as told by your doctor. Finish them even if you start to feel better.  Drink enough fluids to keep your pee (urine) clear or pale yellow.  Avoid tea, drinks with caffeine, and bubbly (carbonated) drinks.  Pee often. Avoid holding your pee in for a long time.  Pee before and after having sex (intercourse).  Wipe from front to back after you poop (bowel movement) if you are a woman. Use each tissue only once. GET HELP RIGHT AWAY IF:   You have back pain.  You have lower belly (abdominal) pain.  You have  chills.  You feel sick to your stomach (nauseous).  You throw up (vomit).  Your burning or discomfort with peeing does not go away.  You have a fever.  Your symptoms are not better in 3 days. MAKE SURE YOU:   Understand these instructions.  Will watch your condition.  Will get help right away if you are not doing well or get worse. Document Released: 12/22/2007 Document Revised: 03/29/2012 Document Reviewed: 02/03/2012 [facility] Patient Information 2015 [place], [place]. This information is not intended to replace advice given to you by your health care provider. Make sure you discuss any questions you have with your health care provider.

## 2014-04-06 NOTE — ED Notes (Signed)
Pt presents with c/o suicidal thoughts. Pt has a hx of anxiety, depression, bipolar disorder, and borderline personality disorder. Pt reports that she is feeling suicidal with no plan, thoughts only. Denies HI. Calm and cooperative at this time.

## 2014-04-07 ENCOUNTER — Encounter (HOSPITAL_COMMUNITY): Payer: Self-pay | Admitting: *Deleted

## 2014-04-07 ENCOUNTER — Inpatient Hospital Stay (HOSPITAL_COMMUNITY)
Admission: AD | Admit: 2014-04-07 | Discharge: 2014-04-16 | DRG: 885 | Disposition: A | Discharge status: Home / Self Care | Payer: Medicaid Other | Source: Intra-hospital | Attending: Psychiatry | Admitting: Psychiatry

## 2014-04-07 ENCOUNTER — Encounter (HOSPITAL_COMMUNITY): Payer: Self-pay | Admitting: Registered Nurse

## 2014-04-07 DIAGNOSIS — R45851 Suicidal ideations: Secondary | ICD-10-CM

## 2014-04-07 DIAGNOSIS — B379 Candidiasis, unspecified: Secondary | ICD-10-CM | POA: Diagnosis present

## 2014-04-07 DIAGNOSIS — K219 Gastro-esophageal reflux disease without esophagitis: Secondary | ICD-10-CM | POA: Diagnosis present

## 2014-04-07 DIAGNOSIS — Z8249 Family history of ischemic heart disease and other diseases of the circulatory system: Secondary | ICD-10-CM

## 2014-04-07 DIAGNOSIS — F17200 Nicotine dependence, unspecified, uncomplicated: Secondary | ICD-10-CM | POA: Diagnosis present

## 2014-04-07 DIAGNOSIS — F319 Bipolar disorder, unspecified: Secondary | ICD-10-CM | POA: Diagnosis present

## 2014-04-07 DIAGNOSIS — F332 Major depressive disorder, recurrent severe without psychotic features: Principal | ICD-10-CM | POA: Diagnosis present

## 2014-04-07 DIAGNOSIS — G4700 Insomnia, unspecified: Secondary | ICD-10-CM | POA: Diagnosis present

## 2014-04-07 DIAGNOSIS — Z8041 Family history of malignant neoplasm of ovary: Secondary | ICD-10-CM

## 2014-04-07 DIAGNOSIS — F411 Generalized anxiety disorder: Secondary | ICD-10-CM | POA: Diagnosis present

## 2014-04-07 DIAGNOSIS — E785 Hyperlipidemia, unspecified: Secondary | ICD-10-CM | POA: Diagnosis present

## 2014-04-07 DIAGNOSIS — F410 Panic disorder [episodic paroxysmal anxiety] without agoraphobia: Secondary | ICD-10-CM | POA: Diagnosis present

## 2014-04-07 DIAGNOSIS — Z79899 Other long term (current) drug therapy: Secondary | ICD-10-CM

## 2014-04-07 LAB — URINE CULTURE: Colony Count: >=100000

## 2014-04-07 MED ORDER — LAMOTRIGINE 25 MG PO TABS
25.0000 mg | ORAL_TABLET | Freq: Every day | ORAL | Status: DC
  Administered 2014-04-07: 25 mg via ORAL
  Filled 2014-04-07: qty 1

## 2014-04-07 MED ORDER — ZOLPIDEM TARTRATE 10 MG PO TABS
10.0000 mg | ORAL_TABLET | Freq: Every day | ORAL | Status: DC
  Administered 2014-04-07 – 2014-04-15 (×9): 10 mg via ORAL
  Filled 2014-04-07 (×2): qty 1
  Filled 2014-04-07: qty 2
  Filled 2014-04-07 (×7): qty 1

## 2014-04-07 MED ORDER — ONDANSETRON HCL 4 MG PO TABS
4.0000 mg | ORAL_TABLET | Freq: Three times a day (TID) | ORAL | Status: DC | PRN
  Administered 2014-04-07: 4 mg via ORAL
  Filled 2014-04-07: qty 1

## 2014-04-07 MED ORDER — IBUPROFEN 200 MG PO TABS: 600.0000 mg | ORAL_TABLET | Freq: Four times a day (QID) | ORAL | Status: DC | PRN

## 2014-04-07 MED ORDER — ALBUTEROL SULFATE HFA 108 (90 BASE) MCG/ACT IN AERS: 2.0000 | INHALATION_SPRAY | RESPIRATORY_TRACT | Status: DC | PRN

## 2014-04-07 MED ORDER — SERTRALINE HCL 50 MG PO TABS
50.0000 mg | ORAL_TABLET | Freq: Every day | ORAL | Status: DC
  Administered 2014-04-07: 50 mg via ORAL
  Filled 2014-04-07: qty 1

## 2014-04-07 MED ORDER — ALUM & MAG HYDROXIDE-SIMETH 200-200-20 MG/5ML PO SUSP: 30.0000 mL | ORAL | Status: DC | PRN

## 2014-04-07 MED ORDER — ASPIRIN-ACETAMINOPHEN-CAFFEINE 250-250-65 MG PO TABS
2.0000 | ORAL_TABLET | Freq: Every day | ORAL | Status: DC | PRN
  Filled 2014-04-07: qty 2

## 2014-04-07 MED ORDER — LAMOTRIGINE 25 MG PO TABS
25.0000 mg | ORAL_TABLET | Freq: Every day | ORAL | Status: DC
  Administered 2014-04-08 – 2014-04-14 (×7): 25 mg via ORAL
  Filled 2014-04-07 (×10): qty 1

## 2014-04-07 MED ORDER — ZOLPIDEM TARTRATE 10 MG PO TABS
10.0000 mg | ORAL_TABLET | Freq: Every day | ORAL | Status: DC
  Administered 2014-04-07: 10 mg via ORAL
  Filled 2014-04-07: qty 1

## 2014-04-07 MED ORDER — MAGNESIUM HYDROXIDE 400 MG/5ML PO SUSP: 30.0000 mL | Freq: Every day | ORAL | Status: DC | PRN

## 2014-04-07 MED ORDER — SERTRALINE HCL 50 MG PO TABS
50.0000 mg | ORAL_TABLET | Freq: Every day | ORAL | Status: DC
  Administered 2014-04-08 – 2014-04-10 (×3): 50 mg via ORAL
  Filled 2014-04-07 (×6): qty 1

## 2014-04-07 MED ORDER — LORAZEPAM 1 MG PO TABS: 1.0000 mg | ORAL_TABLET | Freq: Four times a day (QID) | ORAL | Status: DC | PRN

## 2014-04-07 MED ORDER — ACETAMINOPHEN 325 MG PO TABS: 650.0000 mg | ORAL_TABLET | ORAL | Status: DC | PRN

## 2014-04-07 MED ORDER — NICOTINE 21 MG/24HR TD PT24
21.0000 mg | MEDICATED_PATCH | Freq: Every day | TRANSDERMAL | Status: DC
Start: 2014-04-07 — End: 2014-04-07
  Administered 2014-04-07: 21 mg via TRANSDERMAL
  Filled 2014-04-07: qty 1

## 2014-04-07 MED ORDER — PANTOPRAZOLE SODIUM 40 MG PO TBEC
80.0000 mg | DELAYED_RELEASE_TABLET | Freq: Every day | ORAL | Status: DC
  Administered 2014-04-07: 80 mg via ORAL
  Filled 2014-04-07: qty 2

## 2014-04-07 MED ORDER — ASPIRIN-ACETAMINOPHEN-CAFFEINE 250-250-65 MG PO TABS
2.0000 | ORAL_TABLET | Freq: Every day | ORAL | Status: DC | PRN
  Administered 2014-04-07 – 2014-04-15 (×5): 2 via ORAL
  Filled 2014-04-07 (×3): qty 2

## 2014-04-07 MED ORDER — HYDROXYZINE HCL 25 MG PO TABS
25.0000 mg | ORAL_TABLET | Freq: Four times a day (QID) | ORAL | Status: DC | PRN
  Administered 2014-04-08 – 2014-04-14 (×7): 25 mg via ORAL
  Filled 2014-04-07 (×7): qty 1

## 2014-04-07 MED ORDER — IBUPROFEN 200 MG PO TABS: 600.0000 mg | ORAL_TABLET | Freq: Three times a day (TID) | ORAL | Status: DC | PRN

## 2014-04-07 MED ORDER — PANTOPRAZOLE SODIUM 40 MG PO TBEC
80.0000 mg | DELAYED_RELEASE_TABLET | Freq: Every day | ORAL | Status: DC
  Administered 2014-04-08 – 2014-04-16 (×9): 80 mg via ORAL
  Filled 2014-04-07 (×12): qty 2

## 2014-04-07 MED ORDER — HYDROXYZINE HCL 25 MG PO TABS
25.0000 mg | ORAL_TABLET | Freq: Four times a day (QID) | ORAL | Status: DC | PRN
  Administered 2014-04-07 (×2): 25 mg via ORAL
  Filled 2014-04-07 (×2): qty 1

## 2014-04-07 NOTE — ED Notes (Signed)
Per patient nausea and anxiety no change.

## 2014-04-07 NOTE — Progress Notes (Signed)
D Pt. Reports she was discharged from [facility] appr. 1 month ago.  Pt. Had an appt. But was unable to get a ride and ran out of her medications.  Pt. Presently rates her depression and anxiety at an 8.  Denies SI and HI at present.  A Writer offered support and encouragement.    R Pt. Remains safe on the unit,  Pt. Had a headache that she rated at a 7, and was brought down to a 3 after she received pain medication.  Writer also gave pt. A heat pack due to some of the pain she reports is caused by neck pain.  Pt  Wishes to be admitted to the adult unit where she can hopes to get back on her medications and possibly be referred to [facility] due to location.

## 2014-04-07 NOTE — ED Notes (Signed)
Meal given

## 2014-04-07 NOTE — ED Notes (Signed)
Pt. To [facility]PU from ED ambulatory without difficulty. Pt. Is alert and oriented, warm and dry in no distress. Pt. Denies HI, and AVH. Pt. States she is still having SI without a plan. Pt. Calm and cooperative. Pt. Encouraged to let Nursing staff know if he has concerns or needs.
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
## 2014-04-07 NOTE — ED Notes (Signed)
MD in to see patient 
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
## 2014-04-07 NOTE — BH Assessment (Signed)
Assessment completed. Consulted with Alberteen Sam, NP who recommended that patient be evaluated by psychiatry in the morning. Mercedes Strupp Camprubi-Soms, PA-C has been informed of the recommendation.

## 2014-04-07 NOTE — Progress Notes (Signed)
Pt's referral has been faxed put to the following facilities seeking bed availability for inptx: [facility]- per Crystal can fax [facility]- per Belenda Cruise beds available [facility]- per Darl Pikes possible beds available [facility]- per Chyrl Civatte female bed available and wait list possibility  The following facilities contacted are at capacity:  [facility]arl[facility] Plater- per [facility] only female beds available [facility]- per Johny Chess- per [facility]- per Staci Acosta- per Albertina Senegal- per Uvaldo Rising- per [facility]- per [facility]- per Ulice Brilliant- per [facility]- per Morrie Sheldon [facility]- per [facility] Disposition MHT

## 2014-04-07 NOTE — ED Provider Notes (Signed)
Medical screening examination/treatment/procedure(s) were performed by non-physician practitioner and as supervising physician I was immediately available for consultation/collaboration.   EKG Interpretation None        Lyanne Co, MD 04/07/14 [phone]

## 2014-04-07 NOTE — ED Notes (Signed)
Up to restroom.

## 2014-04-07 NOTE — ED Notes (Signed)
Pt asleep, decreased anxiety

## 2014-04-07 NOTE — BH Assessment (Signed)
Clinician contacted [name]., PA to gather report prior to assessing patient. Patient presents with SI without plan, patient has hx of inpatient tx and current outpatient provider but missed her last med appointment. Clinician spoke to Jillyn Hidden, RN who will set up tele-assessment. Assessment to be initiated.   Nira Retort, MSW, LCSW Triage Specialist [phone]

## 2014-04-07 NOTE — ED Notes (Signed)
Up to the rest room 

## 2014-04-07 NOTE — Progress Notes (Signed)
Patient ID: [name], female   DOB: 1976/02/17, 38 y.o.   MRN: [id number] D: Patient admitted to [facility] for depression and SI.  Patient missed her appt. At [facility] and was unable to get her medications.  She reports decreased sleep, low concentration, low self esteem and hopelessness.  Patient endorses SI with no plan.  Patient was admitted to Obs bed #1. She denies HI/AVH.

## 2014-04-07 NOTE — Consult Note (Signed)
[facility] Consult   Reason for Consult:  Suicidal thoughts Referring Physician:  EDP  [name] is an 38 y.o. female. Total Time spent with patient: 30 minutes  Assessment: AXIS I:  Major Depression, Recurrent severe AXIS II:  Deferred AXIS III:   Past Medical History  Diagnosis Date  . Bronchitis     history of  . Headache(784.0)   . Depression   . Anxiety   . GERD (gastroesophageal reflux disease)     uses otc acid reducer  . Endometriosis   . Renal disorder     kidney stones  . Bipolar 1 disorder   . UTI (lower urinary tract infection)   . Cigarette smoker   . Chronic bronchitis   . Hyperlipidemia    AXIS IV:  other psychosocial or environmental problems AXIS V:  51-60 moderate symptoms  Plan:  24 hour observation  Subjective:   [name] is a 38 y.o. female patient.  HPI:  Patient states that she was recently in [place] and was discharged with medication.  Patient states that she has not taken any medication.  Patient states that she recently started to have worsening depression and suicidal thoughts with out a plan.   HPI Elements:   Location:  Worsening depression. Quality:  suicidal thoughts. Severity:  suicidal thoughts. Timing:  several days. Review of Systems  Psychiatric/Behavioral: Positive for depression and suicidal ideas (Passive). Hallucinations: Denies. Memory loss: Denies. Substance abuse: Denies. Nervous/anxious: Denies. Insomnia: Denies.   All other systems reviewed and are negative.  Family History  Problem Relation Age of Onset  . Heart attack Mother   . Heart attack Father   . Ovarian cancer Maternal Grandmother   . Kidney Stones Mother      Past Psychiatric History: Past Medical History  Diagnosis Date  . Bronchitis     history of  . Headache(784.0)   . Depression   . Anxiety   . GERD (gastroesophageal reflux disease)     uses otc acid reducer  . Endometriosis   . Renal disorder     kidney  stones  . Bipolar 1 disorder   . UTI (lower urinary tract infection)   . Cigarette smoker   . Chronic bronchitis   . Hyperlipidemia     reports that she has been smoking Cigarettes.  She has a 10 pack-year smoking history. She has never used smokeless tobacco. She reports that she does not drink alcohol or use illicit drugs. Family History  Problem Relation Age of Onset  . Heart attack Mother   . Heart attack Father   . Ovarian cancer Maternal Grandmother   . Kidney Stones Mother    Family History Substance Abuse: No Family Supports: Yes, List: (Boyfriend ) Living Arrangements: Spouse/significant other Can pt return to current living arrangement?: Yes Abuse/Neglect [facility]) Physical Abuse: Denies Verbal Abuse: Denies Sexual Abuse: Denies Allergies:   Allergies  Allergen Reactions  . Risperidone And Related Anaphylaxis  . Tramadol Anaphylaxis  . Aripiprazole Other (See Comments)    Causes seizures    ACT Assessment Complete:  Yes:    Educational Status    Risk to Self: Risk to self with the past 6 months Suicidal Ideation: Yes-Currently Present Suicidal Intent: No Is patient at risk for suicide?: No Suicidal Plan?: No Access to Means: No What has been your use of drugs/alcohol within the last 12 months?: No alcohol or drug use reported. Previous Attempts/Gestures: Yes How many times?: 1 Other Self  Harm Risks: No other self harm risk identified at this time. Triggers for Past Attempts: Unpredictable Intentional Self Injurious Behavior: None Family Suicide History: No Recent stressful life event(s): Other (Comment) (Off medication) Persecutory voices/beliefs?: No Depression: Yes Depression Symptoms: Despondent;Insomnia;Tearfulness;Fatigue;Feeling worthless/self pity;Feeling angry/irritable;Guilt Substance abuse history and/or treatment for substance abuse?: No Suicide prevention information given to non-admitted patients: Not applicable  Risk to Others: Risk to Others  within the past 6 months Homicidal Ideation: No Thoughts of Harm to Others: No Current Homicidal Intent: No Current Homicidal Plan: No Access to Homicidal Means: No Identified Victim: NA History of harm to others?: No Assessment of Violence: None Noted Violent Behavior Description: No violent behaviors observed. Pt is calm and cooperative at this time.  Does patient have access to weapons?: No Criminal Charges Pending?: Yes Describe Pending Criminal Charges: Shoplifting Does patient have a court date: Yes Court Date: 05/15/14  Abuse: Abuse/Neglect Assessment (Assessment to be complete while patient is alone) Physical Abuse: Denies Verbal Abuse: Denies Sexual Abuse: Denies Exploitation of patient/patient's resources: Denies Self-Neglect: Denies  Prior Inpatient Therapy: Prior Inpatient Therapy Prior Inpatient Therapy: Yes Prior Therapy Dates: 2014;2015 Prior Therapy Facilty/Provider(s): [facility] Reason for Treatment: depression  Prior Outpatient Therapy: Prior Outpatient Therapy Prior Outpatient Therapy: Yes Prior Therapy Dates: 2014 Prior Therapy Facilty/Provider(s): Dr. Reece Levy Reason for Treatment: depression/bipolar, borderline personality disorder  Additional Information: Additional Information 1:1 In Past 12 Months?: No CIRT Risk: No Elopement Risk: No Does patient have medical clearance?: Yes      Objective: Blood pressure 130/77, pulse 80, temperature 98.1 F (36.7 C), temperature source Oral, resp. rate 18, height '5\' 3"'  (1.6 m), weight 86.183 kg (190 lb), last menstrual period 03/23/2014, SpO2 99.00%.Body mass index is 33.67 kg/(m^2). Results for orders placed during the hospital encounter of 04/06/14 (from the past 72 hour(s))  URINE RAPID DRUG SCREEN (HOSP PERFORMED)     Status: Abnormal   Collection Time    04/06/14 10:14 PM      Result Value Ref Range   Opiates POSITIVE (*) NONE DETECTED   Cocaine NONE DETECTED  NONE DETECTED   Benzodiazepines POSITIVE (*)  NONE DETECTED   Amphetamines NONE DETECTED  NONE DETECTED   Tetrahydrocannabinol NONE DETECTED  NONE DETECTED   Barbiturates NONE DETECTED  NONE DETECTED   Comment:            DRUG SCREEN FOR MEDICAL PURPOSES     ONLY.  IF CONFIRMATION IS NEEDED     FOR ANY PURPOSE, NOTIFY LAB     WITHIN 5 DAYS.                LOWEST DETECTABLE LIMITS     FOR URINE DRUG SCREEN     Drug Class       Cutoff (ng/mL)     Amphetamine      1000     Barbiturate      200     Benzodiazepine   841     Tricyclics       324     Opiates          300     Cocaine          300     THC              50  ACETAMINOPHEN LEVEL     Status: None   Collection Time    04/06/14 10:25 PM      Result Value Ref Range   Acetaminophen (Tylenol),  Serum <15.0  10 - 30 ug/mL   Comment:            THERAPEUTIC CONCENTRATIONS VARY     SIGNIFICANTLY. A RANGE OF 10-30     ug/mL MAY BE AN EFFECTIVE     CONCENTRATION FOR MANY PATIENTS.     HOWEVER, SOME ARE BEST TREATED     AT CONCENTRATIONS OUTSIDE THIS     RANGE.     ACETAMINOPHEN CONCENTRATIONS     >150 ug/mL AT 4 HOURS AFTER     INGESTION AND >50 ug/mL AT 12     HOURS AFTER INGESTION ARE     OFTEN ASSOCIATED WITH TOXIC     REACTIONS.  CBC     Status: None   Collection Time    04/06/14 10:25 PM      Result Value Ref Range   WBC 7.6  4.0 - 10.5 K/uL   RBC 4.40  3.87 - 5.11 MIL/uL   Hemoglobin 13.0  12.0 - 15.0 g/dL   HCT 37.3  36.0 - 46.0 %   MCV 84.8  78.0 - 100.0 fL   MCH 29.5  26.0 - 34.0 pg   MCHC 34.9  30.0 - 36.0 g/dL   RDW 13.1  11.5 - 15.5 %   Platelets 360  150 - 400 K/uL  COMPREHENSIVE METABOLIC PANEL     Status: Abnormal   Collection Time    04/06/14 10:25 PM      Result Value Ref Range   Sodium 139  137 - 147 mEq/L   Potassium 3.7  3.7 - 5.3 mEq/L   Chloride 104  96 - 112 mEq/L   CO2 24  19 - 32 mEq/L   Glucose, Bld 94  70 - 99 mg/dL   BUN 10  6 - 23 mg/dL   Creatinine, Ser 0.55  0.50 - 1.10 mg/dL   Calcium 8.6  8.4 - 10.5 mg/dL   Total Protein 7.6   6.0 - 8.3 g/dL   Albumin 3.6  3.5 - 5.2 g/dL   AST 36  0 - 37 U/L   ALT 48 (*) 0 - 35 U/L   Alkaline Phosphatase 62  39 - 117 U/L   Total Bilirubin 0.3  0.3 - 1.2 mg/dL   GFR calc non Af Amer >90  >90 mL/min   GFR calc Af Amer >90  >90 mL/min   Comment: (NOTE)     The eGFR has been calculated using the CKD EPI equation.     This calculation has not been validated in all clinical situations.     eGFR's persistently <90 mL/min signify possible Chronic Kidney     Disease.   Anion gap 11  5 - 15  ETHANOL     Status: None   Collection Time    04/06/14 10:25 PM      Result Value Ref Range   Alcohol, Ethyl (B) <11  0 - 11 mg/dL   Comment:            LOWEST DETECTABLE LIMIT FOR     SERUM ALCOHOL IS 11 mg/dL     FOR MEDICAL PURPOSES ONLY  SALICYLATE LEVEL     Status: Abnormal   Collection Time    04/06/14 10:25 PM      Result Value Ref Range   Salicylate Lvl <9.1 (*) 2.8 - 20.0 mg/dL   Labs are reviewed see values above.  Medications reviewed.  Patient restarted on medications  Current Facility-Administered Medications  Medication  Dose Route Frequency Provider Last Rate Last Dose  . acetaminophen (TYLENOL) tablet 650 mg  650 mg Oral Q4H PRN Mercedes Strupp Camprubi-Soms, PA-C      . albuterol (PROVENTIL HFA;VENTOLIN HFA) 108 (90 BASE) MCG/ACT inhaler 2 puff  2 puff Inhalation Q4H PRN Mercedes Strupp Camprubi-Soms, PA-C      . alum & mag hydroxide-simeth (MAALOX/MYLANTA) 200-200-20 MG/5ML suspension 30 mL  30 mL Oral PRN Mercedes Strupp Camprubi-Soms, PA-C      . aspirin-acetaminophen-caffeine (EXCEDRIN MIGRAINE) per tablet 2 tablet  2 tablet Oral Daily PRN Mercedes Strupp Camprubi-Soms, PA-C      . hydrOXYzine (ATARAX/VISTARIL) tablet 25 mg  25 mg Oral Q6H PRN Lurena Nida, NP   25 mg at 04/07/14 1418  . lamoTRIgine (LAMICTAL) tablet 25 mg  25 mg Oral Daily Lurena Nida, NP   25 mg at 04/07/14 1027  . nicotine (NICODERM CQ - dosed in mg/24 hours) patch 21 mg  21 mg Transdermal Daily  Mercedes Strupp Camprubi-Soms, PA-C   21 mg at 04/07/14 1029  . ondansetron (ZOFRAN) tablet 4 mg  4 mg Oral Q8H PRN Mercedes Strupp Camprubi-Soms, PA-C   4 mg at 04/07/14 0737  . pantoprazole (PROTONIX) EC tablet 80 mg  80 mg Oral Daily Mercedes Strupp Camprubi-Soms, PA-C   80 mg at 04/07/14 1027  . sertraline (ZOLOFT) tablet 50 mg  50 mg Oral Daily Lurena Nida, NP   50 mg at 04/07/14 1027  . zolpidem (AMBIEN) tablet 10 mg  10 mg Oral QHS Mercedes Strupp Camprubi-Soms, PA-C   10 mg at 04/07/14 6294   Current Outpatient Prescriptions  Medication Sig Dispense Refill  . albuterol (PROVENTIL HFA;VENTOLIN HFA) 108 (90 BASE) MCG/ACT inhaler Inhale 2 puffs into the lungs every 4 (four) hours as needed for wheezing or shortness of breath. Ventolin 2 puff every 12 hours and proair 2 puffs every 4 hours      . aspirin-acetaminophen-caffeine (EXCEDRIN MIGRAINE) 250-250-65 MG per tablet Take 2 tablets by mouth daily as needed for headache.  30 tablet  0  . ibuprofen (ADVIL,MOTRIN) 200 MG tablet Take 600 mg by mouth every 6 (six) hours as needed for moderate pain.      [name] LORazepam (ATIVAN) 1 MG tablet Take 1 mg by mouth every 6 (six) hours as needed for anxiety.      [name] omeprazole (PRILOSEC) 40 MG capsule Take 1 capsule (40 mg total) by mouth 2 (two) times daily. For acid reflux      . zolpidem (AMBIEN) 10 MG tablet Take 10 mg by mouth at bedtime.        Psychiatric Specialty Exam:     Blood pressure 130/77, pulse 80, temperature 98.1 F (36.7 C), temperature source Oral, resp. rate 18, height '5\' 3"'  (1.6 m), weight 86.183 kg (190 lb), last menstrual period 03/23/2014, SpO2 99.00%.Body mass index is 33.67 kg/(m^2).  General Appearance: Casual  Eye Contact::  Good  Speech:  Clear and Coherent and Normal Rate  Volume:  Normal  Mood:  Anxious and Depressed  Affect:  Congruent  Thought Process:  Circumstantial and Goal Directed  Orientation:  Full (Time, Place, and Person)  Thought Content:  "I want to  get back on my medication"  Suicidal Thoughts:  Yes.  without intent/plan  Homicidal Thoughts:  No  Memory:  Immediate;   Good Recent;   Good Remote;   Good  Judgement:  Fair  Insight:  Present  Psychomotor Activity:  Normal  Concentration:  Fair  Recall:  Roel Cluck of Knowledge:Good  Language: Good  Akathisia:  No  Handed:  Right  AIMS (if indicated):     Assets:  Communication Skills Desire for Improvement  Sleep:      Musculoskeletal: Strength & Muscle Tone: within normal limits Gait & Station: normal Patient leans: N/A  Treatment Plan Summary: 24 hour observation  Patient accepted to [facility].  Lamictal 25 mg daily and Zoloft 50 mg daily.  Patient to be set up with outpatient services (medication management) prior to discharge from observation.  Earleen Newport, FNP-BC 04/07/2014 2:56 PM  Patient seen and evaluated with NP as above

## 2014-04-08 DIAGNOSIS — F332 Major depressive disorder, recurrent severe without psychotic features: Secondary | ICD-10-CM | POA: Diagnosis present

## 2014-04-08 DIAGNOSIS — Z8041 Family history of malignant neoplasm of ovary: Secondary | ICD-10-CM | POA: Diagnosis not present

## 2014-04-08 DIAGNOSIS — Z7982 Long term (current) use of aspirin: Secondary | ICD-10-CM | POA: Diagnosis not present

## 2014-04-08 DIAGNOSIS — Z79899 Other long term (current) drug therapy: Secondary | ICD-10-CM | POA: Diagnosis not present

## 2014-04-08 DIAGNOSIS — E785 Hyperlipidemia, unspecified: Secondary | ICD-10-CM | POA: Diagnosis present

## 2014-04-08 DIAGNOSIS — G4700 Insomnia, unspecified: Secondary | ICD-10-CM | POA: Diagnosis present

## 2014-04-08 DIAGNOSIS — B379 Candidiasis, unspecified: Secondary | ICD-10-CM | POA: Diagnosis present

## 2014-04-08 DIAGNOSIS — F17200 Nicotine dependence, unspecified, uncomplicated: Secondary | ICD-10-CM | POA: Diagnosis present

## 2014-04-08 DIAGNOSIS — F411 Generalized anxiety disorder: Secondary | ICD-10-CM | POA: Diagnosis not present

## 2014-04-08 DIAGNOSIS — Z9089 Acquired absence of other organs: Secondary | ICD-10-CM | POA: Diagnosis not present

## 2014-04-08 DIAGNOSIS — Z8249 Family history of ischemic heart disease and other diseases of the circulatory system: Secondary | ICD-10-CM | POA: Diagnosis not present

## 2014-04-08 DIAGNOSIS — K219 Gastro-esophageal reflux disease without esophagitis: Secondary | ICD-10-CM | POA: Diagnosis present

## 2014-04-08 DIAGNOSIS — F319 Bipolar disorder, unspecified: Secondary | ICD-10-CM | POA: Diagnosis present

## 2014-04-08 DIAGNOSIS — Z3202 Encounter for pregnancy test, result negative: Secondary | ICD-10-CM | POA: Diagnosis not present

## 2014-04-08 DIAGNOSIS — R45851 Suicidal ideations: Secondary | ICD-10-CM | POA: Diagnosis not present

## 2014-04-08 DIAGNOSIS — Z9889 Other specified postprocedural states: Secondary | ICD-10-CM | POA: Diagnosis not present

## 2014-04-08 DIAGNOSIS — F410 Panic disorder [episodic paroxysmal anxiety] without agoraphobia: Secondary | ICD-10-CM | POA: Diagnosis present

## 2014-04-08 DIAGNOSIS — Z8742 Personal history of other diseases of the female genital tract: Secondary | ICD-10-CM | POA: Diagnosis not present

## 2014-04-08 DIAGNOSIS — Z862 Personal history of diseases of the blood and blood-forming organs and certain disorders involving the immune mechanism: Secondary | ICD-10-CM | POA: Diagnosis not present

## 2014-04-08 DIAGNOSIS — R319 Hematuria, unspecified: Secondary | ICD-10-CM | POA: Diagnosis not present

## 2014-04-08 DIAGNOSIS — Z8709 Personal history of other diseases of the respiratory system: Secondary | ICD-10-CM | POA: Diagnosis not present

## 2014-04-08 DIAGNOSIS — R109 Unspecified abdominal pain: Secondary | ICD-10-CM | POA: Diagnosis present

## 2014-04-08 DIAGNOSIS — R51 Headache: Secondary | ICD-10-CM | POA: Diagnosis not present

## 2014-04-08 DIAGNOSIS — Z87440 Personal history of urinary (tract) infections: Secondary | ICD-10-CM | POA: Diagnosis not present

## 2014-04-08 MED ORDER — ALUM & MAG HYDROXIDE-SIMETH 200-200-20 MG/5ML PO SUSP
30.0000 mL | ORAL | Status: DC | PRN
  Administered 2014-04-09: 30 mL via ORAL

## 2014-04-08 MED ORDER — MAGNESIUM HYDROXIDE 400 MG/5ML PO SUSP: 30.0000 mL | Freq: Every day | ORAL | Status: DC | PRN

## 2014-04-08 MED ORDER — ACETAMINOPHEN 325 MG PO TABS
650.0000 mg | ORAL_TABLET | Freq: Four times a day (QID) | ORAL | Status: DC | PRN
  Administered 2014-04-10 – 2014-04-12 (×2): 650 mg via ORAL
  Filled 2014-04-08 (×3): qty 2

## 2014-04-08 MED ORDER — NICOTINE 21 MG/24HR TD PT24
21.0000 mg | MEDICATED_PATCH | Freq: Every day | TRANSDERMAL | Status: DC
  Administered 2014-04-08 – 2014-04-16 (×9): 21 mg via TRANSDERMAL
  Filled 2014-04-08 (×13): qty 1
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
## 2014-04-08 NOTE — Progress Notes (Signed)
D:Pt reports that she has constant si with no plan. Pt states that she could not get to her follow up appoints when she left [facility] a month ago. She requests to go go to [facility] when she is discharged and reports that she would like to stay at [facility] and get her medications adjusted. A:Offered support, encouragement and 15 minute checks. R:Safety maintained in the observation unit.

## 2014-04-08 NOTE — Progress Notes (Signed)
Patient ID: [name], female   DOB: May 01, 1976, 38 y.o.   MRN: [id number] Report called to Deeann Cree RN for transfer to the adult unit. Transferred.

## 2014-04-08 NOTE — Progress Notes (Signed)
D: Patient in bed on approach.  Patient states she had a good day.  Patient states she complains of anxiety.  Patient states she is passive SI. Patient verbally contracts for safety.  Patient denies HI and denies AVH. A: Staff to monitor Q 15 mins for safety.  Encouragement and support offered.  Scheduled medications administered per orders.  Vistaril administered prn for for anxiety R: Patient remains safe on the unit.  Patient did not attend group tonight.  Patient visible on the unit for meds and snack.  Patient taking administered medications.

## 2014-04-08 NOTE — [facility] Suicide Risk Assessment (Signed)
[facility] INPATIENT:  Family/Significant Other Suicide Prevention Education  Suicide Prevention Education:  Patient Refusal for Family/Significant Other Suicide Prevention Education: The patient [name] has refused to provide written consent for family/significant other to be provided Family/Significant Other Suicide Prevention Education during admission and/or prior to discharge.  Physician notified.  Amoria Mclees, [place] 04/08/2014, 4:44 PM

## 2014-04-08 NOTE — Progress Notes (Signed)
Patient ID: [name], female   DOB: [birth year], 38 y.o.   MRN: [id number] Dr. Dub Mikes saw patients in OBS today and has determined that client will be transferring to the adult unit for her safety. She has been assigned room 301-1. She is happy with this disposition.

## 2014-04-08 NOTE — [facility] Counselor (Addendum)
Adult Comprehensive Assessment  Patient ID: [name], female   DOB: 07-03-1976, 38 y.o.   MRN: [id number]  Information Source: Information source: Patient  Current Stressors:  Educational / Learning stressors: N/A Employment / Job issues: Unemployed- has applied for disability due to Bipolar Family Relationships: Very limited family support- estranged from sister, mother passed away 2 years ago Surveyor, quantity / Lack of resources (include bankruptcy): only income is boyfriend's disability Housing / Lack of housing: N/A Physical health (include injuries & life threatening diseases): N/A Social relationships: Lack of social support Substance abuse: N/A Bereavement / Loss: Mother passed away 2 years ago- reports that it worsened her depression, anxiety, and panic attacks   Living/Environment/Situation:  Living Arrangements: Spouse/significant other Living conditions (as described by patient or guardian): relaxed, comfortable How long has patient lived in current situation?: 5.5 years What is atmosphere in current home: Comfortable;Supportive  Family History:  Marital status: Separated Separated, when?: Separated from husband for 9 years Long term relationship, how long?: Been with current boyfriend for 6 years What types of issues is patient dealing with in the relationship?: Patient reports that her boyfriend is very supportive Does patient have children?: Yes How many children?: 2 How is patient's relationship with their children?: Patient has a 71 and 75 year old that she sees on the weekends, reports a "close" relationship with children  Childhood History:  By whom was/is the patient raised?: Mother Description of patient's relationship with caregiver when they were a child: close with mother Patient's description of current relationship with people who raised him/her: Close with mother Does patient have siblings?: Yes Number of Siblings: 1 Description of patient's current  relationship with siblings: estranged Did patient suffer any verbal/emotional/physical/sexual abuse as a child?: No Did patient suffer from severe childhood neglect?: No Has patient ever been sexually abused/assaulted/raped as an adolescent or adult?: No Was the patient ever a victim of a crime or a disaster?: No Witnessed domestic violence?: No Has patient been effected by domestic violence as an adult?: No  Education:  Highest grade of school patient has completed: 12th Currently a student?: No Learning disability?: No  Employment/Work Situation:   Employment situation: Unemployed (Has applied for disability) Patient's job has been impacted by current illness: Yes Describe how patient's job has been impacted: Patient reports an inability to work due to Bipolar illness What is the longest time patient has a held a job?: 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 Where was the patient employed at that time?: N/A Has patient ever been in the [employer]?: No Has patient ever served in Buyer, retail?: No  Financial Resources:   Surveyor, quantity resources: [employer];Food stamps;Income from spouse Does patient have a representative payee or guardian?: No  Alcohol/Substance Abuse:   What has been your use of drugs/alcohol within the last 12 months?: Denies alcohol/drug use If attempted suicide, did drugs/alcohol play a role in this?: No Alcohol/Substance Abuse Treatment Hx: Denies past history Has alcohol/substance abuse ever caused legal problems?: No  Social Support System:   Conservation officer, nature Support System: Fair Development worker, community Support System: Patient's identified her boyfriend as supportive Type of faith/religion: Christian How does patient's faith help to cope with current illness?: Patient reads the bible and talks to God, which brings her peace  Leisure/Recreation:   Leisure and Hobbies: reading, coloring, watching movies  Strengths/Needs:   What things does the patient do well?:  Being a mom, good listener In what areas does patient struggle / problems for patient: "  social situations" due to anxiety  Discharge Plan:   Does patient have access to transportation?: Yes Plan for no access to transportation at discharge: Bus System Will patient be returning to same living situation after discharge?: Yes Currently receiving community mental health services: No If no, would patient like referral for services when discharged?: Yes (What county?) Medical sales representative) Does patient have financial barriers related to discharge medications?: No  Summary/Recommendations:     Patient is a 38 year old Caucasian female with a diagnosis of Major Depression, Recurrent severe. Patient lives in [place] with her boyfriend of 6 years who she describes as supportive. Patient states that while she has experienced depression and anxiety in the past, it worsened with the death of her mother whom she was close with 2 years ago. Patient's depression was triggered in July by the anniversary of her mother's death which led to her being hospitalized at that time. When patient returned home, she missed her outpatient provider follow up appointment and did not take her medications as prescribed. She is requesting to follow up with [facility] for therapy and medication management at discharge. Patient lacks strong support system. Patient will benefit from crisis stabilization, medication evaluation, group therapy, and psycho education in addition to case management for discharge planning. Patient and CSW reviewed pt's identified goals and treatment plan. Patient's goals are to stabilize her medications and decrease her depression before returning home. Pt verbalized understanding and agreed to treatment plan.   [name], [place] 04/08/2014

## 2014-04-09 ENCOUNTER — Encounter (HOSPITAL_COMMUNITY): Payer: Self-pay | Admitting: Psychiatry

## 2014-04-09 DIAGNOSIS — R45851 Suicidal ideations: Secondary | ICD-10-CM

## 2014-04-09 DIAGNOSIS — F411 Generalized anxiety disorder: Secondary | ICD-10-CM

## 2014-04-09 DIAGNOSIS — F410 Panic disorder [episodic paroxysmal anxiety] without agoraphobia: Secondary | ICD-10-CM

## 2014-04-09 MED ORDER — CLONAZEPAM 1 MG PO TABS
1.0000 mg | ORAL_TABLET | Freq: Two times a day (BID) | ORAL | Status: DC | PRN
  Administered 2014-04-09 – 2014-04-12 (×7): 1 mg via ORAL
  Filled 2014-04-09 (×7): qty 1

## 2014-04-09 MED ORDER — ENSURE COMPLETE PO LIQD
237.0000 mL | Freq: Two times a day (BID) | ORAL | Status: DC
  Administered 2014-04-09 – 2014-04-14 (×4): 237 mL via ORAL

## 2014-04-09 MED ORDER — FLUCONAZOLE 150 MG PO TABS
150.0000 mg | ORAL_TABLET | Freq: Once | ORAL | Status: AC
  Administered 2014-04-09: 150 mg via ORAL
  Filled 2014-04-09: qty 1

## 2014-04-09 NOTE — [facility] Suicide Risk Assessment (Signed)
Suicide Risk Assessment  Admission Assessment     Nursing information obtained from:    Demographic factors:    Current Mental Status:    Loss Factors:    Historical Factors:    Risk Reduction Factors:    Total Time spent with patient: 45 minutes  CLINICAL FACTORS:   Severe Anxiety and/or Agitation Depression:   Hopelessness Insomnia Severe  COGNITIVE FEATURES THAT CONTRIBUTE TO RISK:  Closed-mindedness Polarized thinking Thought constriction (tunnel vision)    SUICIDE RISK:   Moderate:  Frequent suicidal ideation with limited intensity, and duration, some specificity in terms of plans, no associated intent, good self-control, limited dysphoria/symptomatology, some risk factors present, and identifiable protective factors, including available and accessible social support.  PLAN OF CARE: Supportive approach/coping skills                               CBT;mindfulness                               Resume psychotropics and optimize response  I certify that inpatient services furnished can reasonably be expected to improve the patient's condition.  Britania Shreeve A 04/09/2014, 2:35 PM

## 2014-04-09 NOTE — Progress Notes (Signed)
NUTRITION ASSESSMENT  Pt identified as at risk on the Malnutrition Screen Tool  INTERVENTION: 1. Educated patient on the importance of nutrition and encouraged intake of food and beverages. 2. Supplements: Ensure Complete BID  NUTRITION DIAGNOSIS: Predicted suboptimal energy intake related to variable appetite PTA as evidenced by pt report.   Goal: Pt to meet >/= 90% of their estimated nutrition needs.  Monitor:  PO intake  Assessment:  Admitted for recurrent severe depression. Per H&P, pt states has had constant thoughts of suicide. She also states she is experiencing a lot of anxiety building up to panic.  - Met with pt who reports poor appetite x 1 week with minimal intake of foods like crackers, yogurt, and cheese - Denies any changes in weight - Reports eating good at breakfast and but poor intake at lunch - Agreeable to getting Ensure  38 y.o. female  Height: Ht Readings from Last 1 Encounters:  04/07/14 '5\' 3"'  (1.6 m)    Weight: Wt Readings from Last 1 Encounters:  04/07/14 198 lb (89.812 kg)    Weight Hx: Wt Readings from Last 10 Encounters:  04/07/14 198 lb (89.812 kg)  04/06/14 190 lb (86.183 kg)  03/16/14 200 lb (90.719 kg)  01/10/14 192 lb (87.091 kg)  01/06/14 198 lb (89.812 kg)  01/04/14 198 lb 4.8 oz (89.948 kg)  01/01/14 196 lb 8 oz (89.132 kg)  12/28/13 180 lb (81.647 kg)  12/04/13 180 lb (81.647 kg)  09/16/13 183 lb 8 oz (83.235 kg)    BMI:  Body mass index is 35.08 kg/(m^2). Pt meets criteria for class II obesity based on current BMI.  Estimated Nutritional Needs: Kcal: 25-30 kcal/kg of ideal body weight Protein: > 1 gram protein/kg Fluid: 1 ml/kcal  Diet Order: General Pt is also offered choice of unit snacks mid-morning and mid-afternoon.  Pt is eating as desired.   Lab results and medications reviewed. ALT elevated.   Carlis Stable MS, [place], LDN [phone] Pager [phone] Weekend/After Hours Pager

## 2014-04-09 NOTE — [facility] Group Notes (Signed)
[facility] LCSW Group Therapy 04/09/2014  1:15 PM   Type of Therapy: Group Therapy  Participation Level: Did Not Attend.   Treyten Monestime, MSW, LCSWA Clinical Social Worker [facility] [phone]   

## 2014-04-09 NOTE — H&P (Signed)
Psychiatric Admission Assessment Adult  Patient Identification:  [name] Date of Evaluation:  04/09/2014 Chief Complaint:  MDD, RECURRENT SEVERE History of Present Illness:: 38 Y/O female who states that after she left from here in June, she was not able to keep her appointment for follow up as did not have transportation. Ran out of medications. States that the depression has gotten worst. She states has had constant thoughts of suicide. She also states she is experiencing a lot of anxiety building up to panic. States she had an episode of shortness of breath with the panic and thought she was going to die. States it went away after a while but that it has never been this bad. Cant identify any other precipitating factors other than being off her medications Associated Signs/Synptoms: Depression Symptoms:  depressed mood, anhedonia, insomnia, fatigue, difficulty concentrating, hopelessness, suicidal thoughts with specific plan, anxiety, panic attacks, loss of energy/fatigue, disturbed sleep, decreased appetite, (Hypo) Manic Symptoms:  Labiality of Mood, Anxiety Symptoms:  Excessive Worry, Panic Symptoms, Psychotic Symptoms:  denies PTSD Symptoms: Negative Total Time spent with patient: 45 minutes  Psychiatric Specialty Exam: Physical Exam  Review of Systems  Constitutional: Positive for malaise/fatigue.  HENT:       Migraines  Eyes: Negative.   Respiratory:       Pack a day  Cardiovascular: Negative.   Gastrointestinal: Positive for heartburn and nausea.  Genitourinary:       "yeast infection"  Musculoskeletal: Positive for neck pain.  Skin: Negative.   Neurological: Positive for weakness and headaches.  Endo/Heme/Allergies: Negative.   Psychiatric/Behavioral: Positive for depression and suicidal ideas. The patient is nervous/anxious and has insomnia.     Blood pressure 139/74, pulse 103, temperature 98.4 F (36.9 C), temperature source Oral, resp. rate 16,  height '5\' 3"'  (1.6 m), weight 89.812 kg (198 lb), last menstrual period 03/23/2014, SpO2 98.00%.Body mass index is 35.08 kg/(m^2).  General Appearance: Fairly Groomed  Engineer, water::  Fair  Speech:  Clear and Coherent and not spontaneous  Volume:  Decreased  Mood:  Anxious, Depressed and worried  Affect:  Restricted  Thought Process:  Coherent and Goal Directed  Orientation:  Full (Time, Place, and Person)  Thought Content:  events symptoms worries concerns  Suicidal Thoughts:  Yes.  without intent/plan  Homicidal Thoughts:  No  Memory:  Immediate;   Fair Recent;   Fair Remote;   Fair  Judgement:  Fair  Insight:  Present  Psychomotor Activity:  Normal  Concentration:  Fair  Recall:  [employer] of Knowledge:NA  Language: Fair  Akathisia:  No  Handed:    AIMS (if indicated):     Assets:  Desire for Improvement Housing  Sleep:  Number of Hours: 6.75    Musculoskeletal: Strength & Muscle Tone: within normal limits Gait & Station: normal Patient leans: N/A  Past Psychiatric History: Diagnosis:  Hospitalizations:[facility]  Outpatient Care:[facility]  Substance Abuse Care: Denies  Self-Mutilation:Denies  Suicidal Attempts:Denies  Violent Behaviors:Denies   Past Medical History:   Past Medical History  Diagnosis Date  . Bronchitis     history of  . Headache(784.0)   . Depression   . Anxiety   . GERD (gastroesophageal reflux disease)     uses otc acid reducer  . Endometriosis   . Renal disorder     kidney stones  . Bipolar 1 disorder   . UTI (lower urinary tract infection)   . Cigarette smoker   . Chronic bronchitis   .  Hyperlipidemia     Allergies:   Allergies  Allergen Reactions  . Risperidone And Related Anaphylaxis  . Tramadol Anaphylaxis  . Aripiprazole Other (See Comments)    Causes seizures   PTA Medications: Prescriptions prior to admission  Medication Sig Dispense Refill  . albuterol (PROVENTIL HFA;VENTOLIN HFA) 108 (90 BASE)  MCG/ACT inhaler Inhale 2 puffs into the lungs every 4 (four) hours as needed for wheezing or shortness of breath. Ventolin 2 puff every 12 hours and proair 2 puffs every 4 hours      . aspirin-acetaminophen-caffeine (EXCEDRIN MIGRAINE) 250-250-65 MG per tablet Take 2 tablets by mouth daily as needed for headache.  30 tablet  0  . ibuprofen (ADVIL,MOTRIN) 200 MG tablet Take 600 mg by mouth every 6 (six) hours as needed for moderate pain.      [name] LORazepam (ATIVAN) 1 MG tablet Take 1 mg by mouth every 6 (six) hours as needed for anxiety.      [name] omeprazole (PRILOSEC) 40 MG capsule Take 1 capsule (40 mg total) by mouth 2 (two) times daily. For acid reflux      . zolpidem (AMBIEN) 10 MG tablet Take 10 mg by mouth at bedtime.        Previous Psychotropic Medications:  Medication/Dose    Zoloft, Klonopin, Lamictal Ambien      Prozac Celexa, Lexapro, Effexor, Abilify, Risperdal Seroquel,        Substance Abuse History in the last 12 months:  No.  Consequences of Substance Abuse: NA  Social History:  reports that she has been smoking Cigarettes.  She has a 10 pack-year smoking history. She has never used smokeless tobacco. She reports that she does not drink alcohol or use illicit drugs. 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 Legal History: shop lifting Hobbies/Interests:  Family History:   Family History  Problem Relation Age of Onset  . Heart attack Mother   . Heart attack Father   . Ovarian cancer Maternal Grandmother    . Kidney Stones Mother   Mother and sister depression and anxiety couple of aunts with depression. Grandparents alcoholism  Results for orders placed during the hospital encounter of 04/06/14 (from the past 72 hour(s))  URINE RAPID DRUG SCREEN (HOSP PERFORMED)     Status: Abnormal   Collection Time    04/06/14 10:14 PM      Result Value Ref Range   Opiates POSITIVE (*) NONE DETECTED   Cocaine NONE DETECTED  NONE DETECTED   Benzodiazepines POSITIVE (*) NONE DETECTED   Amphetamines NONE DETECTED  NONE DETECTED   Tetrahydrocannabinol NONE DETECTED  NONE DETECTED   Barbiturates NONE DETECTED  NONE DETECTED   Comment:            DRUG SCREEN FOR MEDICAL PURPOSES     ONLY.  IF CONFIRMATION IS NEEDED     FOR ANY PURPOSE, NOTIFY LAB     WITHIN 5 DAYS.                LOWEST DETECTABLE  LIMITS     FOR URINE DRUG SCREEN     Drug Class       Cutoff (ng/mL)     Amphetamine      1000     Barbiturate      200     Benzodiazepine   630     Tricyclics       160     Opiates          300     Cocaine          300     THC              50  ACETAMINOPHEN LEVEL     Status: None   Collection Time    04/06/14 10:25 PM      Result Value Ref Range   Acetaminophen (Tylenol), Serum <15.0  10 - 30 ug/mL   Comment:            THERAPEUTIC CONCENTRATIONS VARY     SIGNIFICANTLY. A RANGE OF 10-30     ug/mL MAY BE AN EFFECTIVE     CONCENTRATION FOR MANY PATIENTS.     HOWEVER, SOME ARE BEST TREATED     AT CONCENTRATIONS OUTSIDE THIS     RANGE.     ACETAMINOPHEN CONCENTRATIONS     >150 ug/mL AT 4 HOURS AFTER     INGESTION AND >50 ug/mL AT 12     HOURS AFTER INGESTION ARE     OFTEN ASSOCIATED WITH TOXIC     REACTIONS.  CBC     Status: None   Collection Time    04/06/14 10:25 PM      Result Value Ref Range   WBC 7.6  4.0 - 10.5 K/uL   RBC 4.40  3.87 - 5.11 MIL/uL   Hemoglobin 13.0  12.0 - 15.0 g/dL   HCT 37.3  36.0 - 46.0 %   MCV 84.8  78.0 - 100.0 fL   MCH 29.5  26.0 - 34.0 pg   MCHC 34.9  30.0 -  36.0 g/dL   RDW 13.1  11.5 - 15.5 %   Platelets 360  150 - 400 K/uL  COMPREHENSIVE METABOLIC PANEL     Status: Abnormal   Collection Time    04/06/14 10:25 PM      Result Value Ref Range   Sodium 139  137 - 147 mEq/L   Potassium 3.7  3.7 - 5.3 mEq/L   Chloride 104  96 - 112 mEq/L   CO2 24  19 - 32 mEq/L   Glucose, Bld 94  70 - 99 mg/dL   BUN 10  6 - 23 mg/dL   Creatinine, Ser 0.55  0.50 - 1.10 mg/dL   Calcium 8.6  8.4 - 10.5 mg/dL   Total Protein 7.6  6.0 - 8.3 g/dL   Albumin 3.6  3.5 - 5.2 g/dL   AST 36  0 - 37 U/L   ALT 48 (*) 0 - 35 U/L   Alkaline Phosphatase 62  39 - 117 U/L   Total Bilirubin 0.3  0.3 - 1.2 mg/dL   GFR calc non Af Amer >90  >90 mL/min   GFR calc Af Amer >90  >90 mL/min   Comment: (NOTE)     The eGFR has been calculated using the CKD EPI equation.     This calculation has not been validated in all clinical situations.     eGFR's persistently <90 mL/min signify possible Chronic Kidney  Disease.   Anion gap 11  5 - 15  ETHANOL     Status: None   Collection Time    04/06/14 10:25 PM      Result Value Ref Range   Alcohol, Ethyl (B) <11  0 - 11 mg/dL   Comment:            LOWEST DETECTABLE LIMIT FOR     SERUM ALCOHOL IS 11 mg/dL     FOR MEDICAL PURPOSES ONLY  SALICYLATE LEVEL     Status: Abnormal   Collection Time    04/06/14 10:25 PM      Result Value Ref Range   Salicylate Lvl <1.1 (*) 2.8 - 20.0 mg/dL   Psychological Evaluations:  Assessment:   DSM5:  Depressive Disorders:  Major Depressive Disorder - Severe (296.23)  AXIS I:  Generalized Anxiety Disorder, Panic Attacks AXIS II:  No diagnosis AXIS III:   Past Medical History  Diagnosis Date  . Bronchitis     history of  . Headache(784.0)   . Depression   . Anxiety   . GERD (gastroesophageal reflux disease)     uses otc acid reducer  . Endometriosis   . Renal disorder     kidney stones  . Bipolar 1 disorder   . UTI (lower urinary tract infection)   . Cigarette smoker   . Chronic  bronchitis   . Hyperlipidemia    AXIS IV:  other psychosocial or environmental problems AXIS V:  41-50 serious symptoms  Treatment Plan/Recommendations:  Supportive approach/coping skills                                                                 CBT;mindfulness                                                                 Resume meds and optimize response  Treatment Plan Summary: Daily contact with patient to assess and evaluate symptoms and progress in treatment Medication management Current Medications:  Current Facility-Administered Medications  Medication Dose Route Frequency Provider Last Rate Last Dose  . acetaminophen (TYLENOL) tablet 650 mg  650 mg Oral Q6H PRN Nicholaus Bloom, MD      . albuterol (PROVENTIL HFA;VENTOLIN HFA) 108 (90 BASE) MCG/ACT inhaler 2 puff  2 puff Inhalation Q4H PRN Shuvon Rankin, NP      . alum & mag hydroxide-simeth (MAALOX/MYLANTA) 200-200-20 MG/5ML suspension 30 mL  30 mL Oral Q4H PRN Nicholaus Bloom, MD   30 mL at 04/09/14 0848  . aspirin-acetaminophen-caffeine (EXCEDRIN MIGRAINE) per tablet 2 tablet  2 tablet Oral Daily PRN Shuvon Rankin, NP   2 tablet at 04/08/14 0720  . hydrOXYzine (ATARAX/VISTARIL) tablet 25 mg  25 mg Oral Q6H PRN Shuvon Rankin, NP   25 mg at 04/08/14 2121  . lamoTRIgine (LAMICTAL) tablet 25 mg  25 mg Oral Daily Shuvon Rankin, NP   25 mg at 04/09/14 0846  . magnesium hydroxide (MILK OF MAGNESIA) suspension 30 mL  30 mL Oral Daily PRN Shuvon Rankin, NP      .  nicotine (NICODERM CQ - dosed in mg/24 hours) patch 21 mg  21 mg Transdermal Daily Nicholaus Bloom, MD   21 mg at 04/09/14 0845  . pantoprazole (PROTONIX) EC tablet 80 mg  80 mg Oral Daily Shuvon Rankin, NP   80 mg at 04/09/14 0844  . sertraline (ZOLOFT) tablet 50 mg  50 mg Oral Daily Shuvon Rankin, NP   50 mg at 04/09/14 0845  . zolpidem (AMBIEN) tablet 10 mg  10 mg Oral QHS Shuvon Rankin, NP   10 mg at 04/08/14 2121    Observation Level/Precautions:  15 minute checks   Laboratory:  As per the ED  Psychotherapy:  Individual/group  Medications:  Resume Zoloft, Lamictal  Consultations:    Discharge Concerns:    Estimated LOS: 3-5 days  Other:     I certify that inpatient services furnished can reasonably be expected to improve the patient's condition.   Darryel Diodato A 9/22/20159:51 AM

## 2014-04-09 NOTE — Progress Notes (Signed)
Patient ID: [name], female   DOB: Sep 16, 1975, 38 y.o.   MRN: [id number] She has been in bed sleeping most of day, Up for meals. Requested and received klonopin prn for anxiety and went back to bed. Self inventory: depression 8, hopelessness 5, anxiety 8, denies SI thoughs. C/o pain head but did not request a prn. Withdrawals of agitation, tremors, chilling and irritability. Pain of headache dizziness  Did not request additional prn. Her Goal: understand why it was so easy for me to fall into this lifestyle. To meet goal: Prayer and quiet time.

## 2014-04-09 NOTE — Tx Team (Addendum)
Interdisciplinary Treatment Plan Update (Adult) Date: 04/09/2014   Time Reviewed: 9:30 AM  Progress in Treatment: Attending groups: Still assessing, patient new to milieu  Participating in groups: Still assessing, patient new to milieu  Taking medication as prescribed: Yes Tolerating medication: Yes Family/Significant other contact made: No, CSW continuing to assess Patient understands diagnosis: Yes Discussing patient identified problems/goals with staff: Yes Medical problems stabilized or resolved: Yes Denies suicidal/homicidal ideation: CSW assessing Issues/concerns per patient self-inventory: Yes Other:  New problem(s) identified: N/A  Discharge Plan or Barriers: Patient to discharge home with boyfriend and follow up with [facility] for therapy and medication management.  Reason for Continuation of Hospitalization:  Depression Anxiety Medication Stabilization   Comments: N/A  Estimated length of stay: 3-5 days  For review of initial/current patient goals, please see plan of care. Patient and CSW reviewed pt's identified goals and treatment plan. Patient's goal is to stabilize her medications and decrease her depression. Pt verbalized understanding and agreed to treatment plan.     Attendees:  Patient:    Family:    Physician: Dr. Dub Mikes  04/09/2014 9:30 AM   Nursing: Roswell Miners; Kathi Simpers; Michaelle Birks, RN  04/09/2014 9:30 AM   Clinical Social Worker: Samuella Bruin, LCSWA  04/09/2014 9:30 AM   Other: Juline Patch, LCSW  04/09/2014 9:30 AM   Other: Leisa Lenz, Vesta Mixer Liaison  04/09/2014 9:30 AM   Other: Onnie Boer, Case Manager  04/09/2014 9:30 AM   Other:  04/09/2014 9:30 AM   Other:  04/09/2014 9:30 AM   Other:    Other:    Other:    Other:          Scribe for Treatment Team:  Samuella Bruin, MSW, [employer] [phone]

## 2014-04-09 NOTE — Progress Notes (Signed)
Recreation Therapy Notes   Animal-Assisted Activity/Therapy (AAA/T) Program Checklist/Progress Notes Patient Eligibility Criteria Checklist & Daily Group note for Rec Tx Intervention  Date: 09.22.2015 Time: 2:45pm Location: [address]    AAA/T Program Assumption of Risk Form signed by Patient/ or Parent Legal Guardian yes  Patient is free of allergies or sever asthma yes  Patient reports no fear of animals yes  Patient reports no history of cruelty to animals yes   Patient understands his/her participation is voluntary yes  Behavioral Response: Did not attend.   Marykay Lex Kailan Laws, LRT/CTRS  Klarissa Mcilvain L 04/09/2014 3:30 PM

## 2014-04-09 NOTE — Progress Notes (Signed)
D:Patient in the hallway on approach.  Patient states she had a good day but still remains depressed.  Patient states she did not meet her goal for today which was to be less depressed.  Patient states she was started on Ensure today because she was not eating well.  Patient denies SI/HI and denies AVH. A: Staff to monitor Q 15 mins for safety.  Encouragement and support offered.  Scheduled medications administered per orders.  Klonopin administered prn for anxiety.  Ambien administered prn for sleep. R: Patient remains safe on the unit.  Patient did not attend group tonight.  Patient not visible on the unit tonight.  Patient taking administered medications.

## 2014-04-10 MED ORDER — SERTRALINE HCL 50 MG PO TABS
75.0000 mg | ORAL_TABLET | Freq: Every day | ORAL | Status: DC
  Administered 2014-04-11: 75 mg via ORAL
  Filled 2014-04-10 (×3): qty 1

## 2014-04-10 MED ORDER — ONDANSETRON 4 MG PO TBDP
4.0000 mg | ORAL_TABLET | Freq: Three times a day (TID) | ORAL | Status: DC | PRN
  Administered 2014-04-10: 4 mg via ORAL
  Filled 2014-04-10: qty 1

## 2014-04-10 NOTE — Progress Notes (Signed)
D:Patient in bed on approach.  Patient states she ahd a good day .  Patient states she attended some groups.  Patient states she is passive SI but verbally contracts for safety.  Patient denies HI and denies AVH.  Patient states her goal for today was to be less depressed.  Patient still rates depression 9/10.   A: Staff to monitor Q 15 mins for safety.  Encouragement and support offered.  Scheduled medications administered per orders.  Klonopin administered prn for anxiety.  Zofran administered prn for nausea.   R: Patient remains safe on the unit.  Patient attended group tonight.

## 2014-04-10 NOTE — [facility] Group Notes (Signed)
[facility] LCSW Group Therapy 04/10/2014  1:15 PM Type of Therapy: Group Therapy Participation Level: Active  Participation Quality: Attentive, Sharing and Supportive  Affect: Depressed and Flat  Cognitive: Alert and Oriented  Insight: Developing/Improving and Engaged  Engagement in Therapy: Developing/Improving and Engaged  Modes of Intervention: Clarification, Confrontation, Discussion, Education, Exploration, Limit-setting, Orientation, Problem-solving, Rapport Building, Dance movement psychotherapist, Socialization and Support  Summary of Progress/Problems: The topic for group today was emotional regulation. This group focused on both positive and negative emotion identification and allowed group members to process ways to identify feelings, regulate negative emotions, and find healthy ways to manage internal/external emotions. Group members were asked to reflect on a time when their reaction to an emotion led to a negative outcome and explored how alternative responses using emotion regulation would have benefited them. Group members were also asked to discuss a time when emotion regulation was utilized when a negative emotion was experienced. Patient reported that she would like to have more control over her anxiety. Patient reports feeling worried about the unknown and her past experiences with agoraphobia and losing her mother. CSW's and other group members provided emotional support and encouragement.  Samuella Bruin, MSW, [employer] Clinical Social Worker [facility] [phone]

## 2014-04-10 NOTE — Progress Notes (Signed)
Patient ID: [name], female   DOB: 05/10/1976, 38 y.o.   MRN: [id number] She has been in bed most of the day up for medications and meals has some interaction with peers. Has requested and received prn today for anxiety, pain, . Self inventory: depression, hopelessness, and anxiety are 10's. No c/o withdrawals. Positive SI will contracts for safety. Goal: to be less depressed and get more rest.

## 2014-04-10 NOTE — Progress Notes (Signed)
[facility] MD Progress Note  04/10/2014 3:22 PM [name]  MRN:  [id number] Subjective:  Nalanie states she still feels depressed and suicidal. States she has been struggling with the suicidal ideas for months now. States that during the summer she saw her kids during weekends but once school started she hardly sees them. States she does not want then to see her like this anyway..She stays in her room. 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 She states she had some nausea when she took the Lamictal. She also admits she is under financial strain as she does not have a regular income. She has not been able to work and is planning to apply for disability Diagnosis:   DSM5: Depressive Disorders:  Major Depressive Disorder - Severe (296.23) Total Time spent with patient: 30 minutes  Axis I: Generalized Anxiety Disorder, Panic attacks  ADL's:  Intact  Sleep: Poor  Appetite:  Fair  Psychiatric Specialty Exam: Physical Exam  Review of Systems  Constitutional: Positive for malaise/fatigue.  HENT: Negative.   Eyes: Negative.   Respiratory: Negative.   Cardiovascular: Negative.   Gastrointestinal: Negative.   Genitourinary: Negative.   Musculoskeletal: Negative.   Skin: Negative.   Neurological: Positive for weakness.  Endo/Heme/Allergies: Negative.   Psychiatric/Behavioral: Positive for depression and suicidal ideas. The patient is nervous/anxious and has insomnia.     Blood pressure 177/79, pulse 68, temperature 98.4 F (36.9 C), temperature source Oral, resp. rate 18, height  (1.6 m), weight 89.812 kg (198 lb), last menstrual period 03/23/2014, SpO2 98.00%.Body mass index is 35.08 kg/(m^2).  General Appearance: Fairly Groomed  Patent attorney::  Fair  Speech:  Clear and Coherent, Slow and not spontaneous  Volume:  Decreased  Mood:  Anxious and Depressed  Affect:   Restricted  Thought Process:  Coherent and Goal Directed  Orientation:  Full (Time, Place, and Person)  Thought Content:  events symptoms worries concerns  Suicidal Thoughts:  No  Homicidal Thoughts:  No  Memory:  Immediate;   Fair Recent;   Fair Remote;   Fair  Judgement:  Fair  Insight:  Present  Psychomotor Activity:  Decreased  Concentration:  Fair  Recall:  [name] of Knowledge:NA  Language: Fair  Akathisia:  No  Handed:    AIMS (if indicated):     Assets:  Desire for Improvement  Sleep:  Number of Hours: 5   Musculoskeletal: Strength & Muscle Tone: within normal limits Gait & Station: normal Patient leans: N/A  Current Medications: Current Facility-Administered Medications  Medication Dose Route Frequency Provider Last Rate Last Dose  . acetaminophen (TYLENOL) tablet 650 mg  650 mg Oral Q6H PRN Rachael Fee, MD   650 mg at 04/10/14 1257  . albuterol (PROVENTIL HFA;VENTOLIN HFA) 108 (90 BASE) MCG/ACT inhaler 2 puff  2 puff Inhalation Q4H PRN Shuvon Rankin, NP      . alum & mag hydroxide-simeth (MAALOX/MYLANTA) 200-200-20 MG/5ML suspension 30 mL  30 mL Oral Q4H PRN Rachael Fee, MD   30 mL at 04/09/14 0848  . aspirin-acetaminophen-caffeine (EXCEDRIN MIGRAINE) per tablet 2 tablet  2 tablet Oral Daily PRN Shuvon Rankin, NP   2 tablet at 04/09/14 1600  . clonazePAM (KLONOPIN) tablet 1 mg  1 mg Oral BID PRN Rachael Fee, MD   1 mg at 04/10/14 5176  .  feeding supplement (ENSURE COMPLETE) (ENSURE COMPLETE) liquid 237 mL  237 mL Oral BID BM Tenny Craw, RD   237 mL at 04/09/14 2111  . hydrOXYzine (ATARAX/VISTARIL) tablet 25 mg  25 mg Oral Q6H PRN Shuvon Rankin, NP   25 mg at 04/10/14 1257  . lamoTRIgine (LAMICTAL) tablet 25 mg  25 mg Oral Daily Shuvon Rankin, NP   25 mg at 04/10/14 0830  . magnesium hydroxide (MILK OF MAGNESIA) suspension 30 mL  30 mL Oral Daily PRN Shuvon Rankin, NP      . nicotine (NICODERM CQ - dosed in mg/24 hours) patch 21 mg  21 mg Transdermal  Daily Rachael Fee, MD   21 mg at 04/10/14 0829  . ondansetron (ZOFRAN-ODT) disintegrating tablet 4 mg  4 mg Oral Q8H PRN Rachael Fee, MD      . pantoprazole (PROTONIX) EC tablet 80 mg  80 mg Oral Daily Shuvon Rankin, NP   80 mg at 04/10/14 0830  . [START ON 04/11/2014] sertraline (ZOLOFT) tablet 75 mg  75 mg Oral Daily Rachael Fee, MD      . zolpidem [facility]) tablet 10 mg  10 mg Oral QHS Shuvon Rankin, NP   10 mg at 04/09/14 2111    Lab Results: No results found for this or any previous visit (from the past 48 hour(s)).  Physical Findings: AIMS: Facial and Oral Movements Muscles of Facial Expression: None, normal Lips and Perioral Area: None, normal Jaw: None, normal Tongue: None, normal,Extremity Movements Upper (arms, wrists, hands, fingers): None, normal Lower (legs, knees, ankles, toes): None, normal, Trunk Movements Neck, shoulders, hips: None, normal, Overall Severity Severity of abnormal movements (highest score from questions above): None, normal Incapacitation due to abnormal movements: None, normal Patient's awareness of abnormal movements (rate only patient's report): No Awareness, Dental Status Current problems with teeth and/or dentures?: No Does patient usually wear dentures?: No  CIWA:    COWS:     Treatment Plan Summary: Daily contact with patient to assess and evaluate symptoms and progress in treatment Medication management  Plan: Supportive approach/coping skills           CBT;mindfulness           Will increase the Zoloft to 75 mg daily           Continue the Lamictal at 25 mg daily            Optimize response to the psychotropics  Medical Decision Making Problem Points:  Review of psycho-social stressors (1) Data Points:  Review of medication regiment & side effects (2) Review of new medications or change in dosage (2)  I certify that inpatient services furnished can reasonably be expected to improve the patient's condition.   Sherleen Pangborn  A 04/10/2014, 3:22 PM

## 2014-04-10 NOTE — [facility] Group Notes (Signed)
   [facility] LCSW Aftercare Discharge Planning Group Note  04/10/2014  8:45 AM   Participation Quality: Alert, Appropriate and Oriented  Mood/Affect: Depressed and Flat  Depression Rating: 10  Anxiety Rating: 10  Thoughts of Suicide: Pt endorses SI  Will you contract for safety? CSW continuing to assess  Current AVH: Pt denies  Plan for Discharge/Comments: Pt attended discharge planning group and actively participated in group. CSW provided pt with today's workbook. Patient reports feeling "the same" today. She reports high levels of depression and anxiety, as well as SI. She plans to follow up at [facility] at discharge and return home with her boyfriend.  Transportation Means: Pt reports access to transportation, requesting bus pass at discharge  Supports: Patient's boyfriend has been identified by patient as a support  Samuella Bruin, MSW, [employer] Clinical Social Worker [employer] [phone]

## 2014-04-10 NOTE — Progress Notes (Signed)
Adult Psychoeducational Group Note  Date:  04/10/2014 Time:  10:00 am  Group Topic/Focus:  Wellness Toolbox:   The focus of this group is to discuss various aspects of wellness, balancing those aspects and exploring ways to increase the ability to experience wellness.  Patients will create a wellness toolbox for use upon discharge.  Participation Level:  Active  Participation Quality:  Appropriate, Sharing and Supportive  Affect:  Appropriate  Cognitive:  Appropriate  Insight: Appropriate  Engagement in Group:  Engaged  Modes of Intervention:  Discussion, Education, Socialization and Support  Additional Comments:  Pt stated that when she is reading and talking to others that she is doing well. Pt stated that when she is crying, shaking and isolating herself that she is not doing well. Pt identified her case worker as the first person to contact during a crisis.   [name] 04/10/2014, 10:23 AM

## 2014-04-11 MED ORDER — SERTRALINE HCL 100 MG PO TABS
100.0000 mg | ORAL_TABLET | Freq: Every day | ORAL | Status: DC
  Administered 2014-04-12 – 2014-04-16 (×5): 100 mg via ORAL
  Filled 2014-04-11: qty 3
  Filled 2014-04-11 (×6): qty 1

## 2014-04-11 MED ORDER — QUETIAPINE FUMARATE 50 MG PO TABS
50.0000 mg | ORAL_TABLET | Freq: Every day | ORAL | Status: DC
  Administered 2014-04-11: 50 mg via ORAL
  Filled 2014-04-11 (×5): qty 1

## 2014-04-11 NOTE — Progress Notes (Signed)
Patient ID: [name], female   DOB: Oct 27, 1975, 38 y.o.   MRN: [id number] She haas been out of bed more today and has attended groups interacting with peers and staff. Self inventory:  Scores have been the same as previous day. Depression , hopelessness, and anxiety 10. Positive SI contracts for safety.  Has requested and received prn medications for anxiety that was effective.

## 2014-04-11 NOTE — [facility] Group Notes (Signed)
0900 nursing orientation group   The focus of this group is to educate the patient on the purpose and policies of crisis stabilization and provide a format to answer questions about their admission.  The group details unit policies and expectations of patients while admitted.   Pt was an active participant in group and was appropriate in sharing.  

## 2014-04-11 NOTE — [facility] Group Notes (Signed)
[facility] LCSW Group Therapy 04/11/2014 1:15 PM Type of Therapy: Group Therapy Participation Level: Active  Participation Quality: Attentive, Sharing and Supportive  Affect: Depressed and Flat  Cognitive: Alert and Oriented  Insight: Developing/Improving and Engaged  Engagement in Therapy: Developing/Improving and Engaged  Modes of Intervention: Activity, Clarification, Confrontation, Discussion, Education, Exploration, Limit-setting, Orientation, Problem-solving, Rapport Building, Reality Testing, Socialization and Support  Summary of Progress/Problems: Patient was attentive and engaged with speaker from [employer]. Patient was attentive to speaker while they shared their story of dealing with mental health and overcoming it. Patient expressed interest in their programs and services and received information on their agency. Patient processed ways they can relate to the speaker.   Caelyn Route, MSW, LCSWA Clinical Social Worker [facility] [phone]   

## 2014-04-11 NOTE — Progress Notes (Signed)
Patient did attend the evening karaoke group. Pt was attentive and supportive, but did not sing a song.

## 2014-04-11 NOTE — Progress Notes (Signed)
[facility] MD Progress Note  04/11/2014 1:15 PM [name]  MRN:  [id number] Subjective:  States she still feels depressed and has suicidal ideations. She states she does not sleep trough the night. She wakes up and starts thinking, worrying. States that this is when she starts thinking she would rather be dead, wants to quit feeling this way. She states she starts thinking his kids would be better off without her. She states the only thing that would be a deterrent is the thought that she would go to hell and will not be able to reunite with the rest of the family. She is also afraid that she will have exacerbation of the panic attacks as they happened before and that she would not be able to control them Diagnosis:   DSM5: Depressive Disorders:  Major Depressive Disorder - Severe (296.23) Total Time spent with patient: 30 minutes  Axis I: Generalized Anxiety Disorder and Panic Disorder  ADL's:  Intact  Sleep: Poor  Appetite:  Fair Psychiatric Specialty Exam: Physical Exam  Review of Systems  Constitutional: Positive for malaise/fatigue.  HENT: Negative.   Eyes: Negative.   Respiratory: Negative.   Cardiovascular: Negative.   Gastrointestinal: Negative.   Genitourinary: Negative.   Musculoskeletal: Negative.   Skin: Negative.   Neurological: Positive for weakness.  Endo/Heme/Allergies: Negative.   Psychiatric/Behavioral: Positive for depression and suicidal ideas. The patient is nervous/anxious.     Blood pressure 115/74, pulse 98, temperature 98.9 F (37.2 C), temperature source Oral, resp. rate 18, height  (1.6 m), weight 89.812 kg (198 lb), last menstrual period 03/23/2014, SpO2 98.00%.Body mass index is 35.08 kg/(m^2).  General Appearance: Fairly Groomed  Patent attorney::  Fair  Speech:  Clear and Coherent and not spontaneous  Volume:  Decreased  Mood:  Anxious and Depressed  Affect:  Restricted  Thought Process:  Coherent and Goal Directed  Orientation:  Full (Time,  Place, and Person)  Thought Content:  events symptoms worries concerns  Suicidal Thoughts:  Yes  Homicidal Thoughts:  No  Memory:  Immediate;   Poor Recent;   Fair Remote;   Fair  Judgement:  Fair  Insight:  Present  Psychomotor Activity:  Decreased  Concentration:  Fair  Recall:  [name] of Knowledge:NA  Language: Fair  Akathisia:  No  Handed:    AIMS (if indicated):     Assets:  Desire for Improvement  Sleep:  Number of Hours: 6.75   Musculoskeletal: Strength & Muscle Tone: within normal limits Gait & Station: normal Patient leans: N/A  Current Medications: Current Facility-Administered Medications  Medication Dose Route Frequency Provider Last Rate Last Dose  . acetaminophen (TYLENOL) tablet 650 mg  650 mg Oral Q6H PRN Rachael Fee, MD   650 mg at 04/10/14 1257  . albuterol (PROVENTIL HFA;VENTOLIN HFA) 108 (90 BASE) MCG/ACT inhaler 2 puff  2 puff Inhalation Q4H PRN Shuvon Rankin, NP      . alum & mag hydroxide-simeth (MAALOX/MYLANTA) 200-200-20 MG/5ML suspension 30 mL  30 mL Oral Q4H PRN Rachael Fee, MD   30 mL at 04/09/14 0848  . aspirin-acetaminophen-caffeine (EXCEDRIN MIGRAINE) per tablet 2 tablet  2 tablet Oral Daily PRN Shuvon Rankin, NP   2 tablet at 04/09/14 1600  . clonazePAM (KLONOPIN) tablet 1 mg  1 mg Oral BID PRN Rachael Fee, MD   1 mg at 04/11/14 0802  . feeding supplement (ENSURE COMPLETE) (ENSURE COMPLETE) liquid 237 mL  237 mL Oral BID BM Heather S  Mattie Marlin, RD   237 mL at 04/09/14 2111  . hydrOXYzine (ATARAX/VISTARIL) tablet 25 mg  25 mg Oral Q6H PRN Shuvon Rankin, NP   25 mg at 04/10/14 1257  . lamoTRIgine (LAMICTAL) tablet 25 mg  25 mg Oral Daily Shuvon Rankin, NP   25 mg at 04/11/14 0759  . magnesium hydroxide (MILK OF MAGNESIA) suspension 30 mL  30 mL Oral Daily PRN Shuvon Rankin, NP      . nicotine (NICODERM CQ - dosed in mg/24 hours) patch 21 mg  21 mg Transdermal Daily Rachael Fee, MD   21 mg at 04/11/14 0757  . ondansetron (ZOFRAN-ODT)  disintegrating tablet 4 mg  4 mg Oral Q8H PRN Rachael Fee, MD   4 mg at 04/10/14 2102  . pantoprazole (PROTONIX) EC tablet 80 mg  80 mg Oral Daily Shuvon Rankin, NP   80 mg at 04/11/14 0759  . QUEtiapine (SEROQUEL) tablet 50 mg  50 mg Oral QHS Rachael Fee, MD      . sertraline (ZOLOFT) tablet 75 mg  75 mg Oral Daily Rachael Fee, MD   75 mg at 04/11/14 0759  . zolpidem (AMBIEN) tablet 10 mg  10 mg Oral QHS Shuvon Rankin, NP   10 mg at 04/10/14 2102    Lab Results: No results found for this or any previous visit (from the past 48 hour(s)).  Physical Findings: AIMS: Facial and Oral Movements Muscles of Facial Expression: None, normal Lips and Perioral Area: None, normal Jaw: None, normal Tongue: None, normal,Extremity Movements Upper (arms, wrists, hands, fingers): None, normal Lower (legs, knees, ankles, toes): None, normal, Trunk Movements Neck, shoulders, hips: None, normal, Overall Severity Severity of abnormal movements (highest score from questions above): None, normal Incapacitation due to abnormal movements: None, normal Patient's awareness of abnormal movements (rate only patient's report): No Awareness, Dental Status Current problems with teeth and/or dentures?: No Does patient usually wear dentures?: No  CIWA:    COWS:     Treatment Plan Summary: Daily contact with patient to assess and evaluate symptoms and progress in treatment Medication management  Plan: Supportive approach/coping skills           CBT;mindfulness           Will have a trial with Seroquel (she has been given Seroquel high dosages that were                 very sedating but willing to try a lower dose           Increase the Zoloft to 100 mg Medical Decision Making Problem Points:  Review of psycho-social stressors (1) Data Points:  Review of medication regiment & side effects (2) Review of new medications or change in dosage (2)  I certify that inpatient services furnished can reasonably be  expected to improve the patient's condition.   Joash Tony A 04/11/2014, 1:15 PM

## 2014-04-12 MED ORDER — CLONAZEPAM 1 MG PO TABS
1.0000 mg | ORAL_TABLET | Freq: Three times a day (TID) | ORAL | Status: DC | PRN
  Administered 2014-04-12 – 2014-04-16 (×13): 1 mg via ORAL
  Filled 2014-04-12 (×13): qty 1

## 2014-04-12 MED ORDER — ONDANSETRON 4 MG PO TBDP
8.0000 mg | ORAL_TABLET | Freq: Three times a day (TID) | ORAL | Status: DC | PRN
  Administered 2014-04-13 – 2014-04-14 (×3): 8 mg via ORAL
  Filled 2014-04-12 (×3): qty 2

## 2014-04-12 MED ORDER — CLOTRIMAZOLE 1 % VA CREA
1.0000 | TOPICAL_CREAM | Freq: Every day | VAGINAL | Status: DC
  Administered 2014-04-12 – 2014-04-15 (×4): 1 via VAGINAL
  Filled 2014-04-12: qty 45

## 2014-04-12 MED ORDER — QUETIAPINE FUMARATE 100 MG PO TABS
100.0000 mg | ORAL_TABLET | Freq: Every day | ORAL | Status: DC
  Administered 2014-04-12 – 2014-04-13 (×2): 100 mg via ORAL
  Filled 2014-04-12 (×5): qty 1

## 2014-04-12 NOTE — Clinical Social Work Note (Signed)
CSW advised DON and RN, per patient's face sheet, patient Lossie Faes is her significant other.

## 2014-04-12 NOTE — Progress Notes (Signed)
    [name] has had an " ok " day today... She has spent most of her day resting in her bed, due to c/o migraine headache.   A She has attended some of her groups today and she has received 2 doses of prn klonopin, both which she says have been efffective. She deneis SI within the past 24 hrs and she rates ehr depression, hopelessness and anxiety " 03/27/09".   R Safety is maintaiend and poc moves forward.

## 2014-04-12 NOTE — Progress Notes (Signed)
[facility] MD Progress Note  04/12/2014 2:11 PM [name]  MRN:  [id number] Subjective:  [name] is still having suicidal ideas. She did not sleep any better last night. She states that the staff called her as she had a phone call. As she has not given her code number to anyone she though something had happened to one of his kids and they were able to somehow get the code number. States the phone call was not for her. She states she had an acute anxiety attack as the one she had before she came were she thought she was going to die. States she went to her room and dealt with it. She is afraid that the acute panic is going to take over. She asked to be able to have an extra Klonopin if this was to happen again. She is not opposed to increase the Seroquel as she is not having any sedation coming from it. Diagnosis:   DSM5: Depressive Disorders:  Major Depressive Disorder - Severe (296.23) Total Time spent with patient: 30 minutes  Axis I: Generalized Anxiety Disorder and Panic Disorder  ADL's:  Intact  Sleep: Fair  Appetite:  Fair  Psychiatric Specialty Exam: Physical Exam  Review of Systems  Constitutional: Negative.   HENT: Negative.   Eyes: Negative.   Respiratory: Negative.   Cardiovascular: Negative.   Gastrointestinal: Negative.   Genitourinary:       Yeast infection  Skin: Negative.   Neurological: Negative.   Endo/Heme/Allergies: Negative.   Psychiatric/Behavioral: Positive for depression and suicidal ideas. The patient is nervous/anxious and has insomnia.     Blood pressure 101/74, pulse 108, temperature 99.9 F (37.7 C), temperature source Oral, resp. rate 16, height  (1.6 m), weight 89.812 kg (198 lb), last menstrual period 03/23/2014, SpO2 98.00%.Body mass index is 35.08 kg/(m^2).  General Appearance: Fairly Groomed  Patent attorney::  Fair  Speech:  Clear and Coherent  Volume:  Normal  Mood:  Anxious, Depressed and worried  Affect:  sad, anxious, worried  Thought  Process:  Coherent and Goal Directed  Orientation:  Full (Time, Place, and Person)  Thought Content:  symptoms worries concerns  Suicidal Thoughts:  Yes.  without intent/plan  Homicidal Thoughts:  No  Memory:  Immediate;   Fair Recent;   Fair Remote;   Fair  Judgement:  Fair  Insight:  Present  Psychomotor Activity:  Restlessness  Concentration:  Fair  Recall:  [name] of Knowledge:NA  Language: Fair  Akathisia:  No  Handed:    AIMS (if indicated):     Assets:  Desire for Improvement  Sleep:  Number of Hours: 4.75   Musculoskeletal: Strength & Muscle Tone: within normal limits Gait & Station: normal Patient leans: N/A  Current Medications: Current Facility-Administered Medications  Medication Dose Route Frequency Provider Last Rate Last Dose  . acetaminophen (TYLENOL) tablet 650 mg  650 mg Oral Q6H PRN Rachael Fee, MD   650 mg at 04/10/14 1257  . albuterol (PROVENTIL HFA;VENTOLIN HFA) 108 (90 BASE) MCG/ACT inhaler 2 puff  2 puff Inhalation Q4H PRN Shuvon Rankin, NP      . alum & mag hydroxide-simeth (MAALOX/MYLANTA) 200-200-20 MG/5ML suspension 30 mL  30 mL Oral Q4H PRN Rachael Fee, MD   30 mL at 04/09/14 0848  . aspirin-acetaminophen-caffeine (EXCEDRIN MIGRAINE) per tablet 2 tablet  2 tablet Oral Daily PRN Shuvon Rankin, NP   2 tablet at 04/12/14 0858  . clonazePAM (KLONOPIN) tablet 1 mg  1 mg Oral TID PRN Rachael Fee, MD      . clotrimazole (GYNE-LOTRIMIN) vaginal cream 1 Applicatorful  1 Applicatorful Vaginal QHS Rachael Fee, MD      . feeding supplement (ENSURE COMPLETE) (ENSURE COMPLETE) liquid 237 mL  237 mL Oral BID BM Tenny Craw, RD   237 mL at 04/09/14 2111  . hydrOXYzine (ATARAX/VISTARIL) tablet 25 mg  25 mg Oral Q6H PRN Shuvon Rankin, NP   25 mg at 04/11/14 1905  . lamoTRIgine (LAMICTAL) tablet 25 mg  25 mg Oral Daily Shuvon Rankin, NP   25 mg at 04/12/14 0855  . magnesium hydroxide (MILK OF MAGNESIA) suspension 30 mL  30 mL Oral Daily PRN Shuvon  Rankin, NP      . nicotine (NICODERM CQ - dosed in mg/24 hours) patch 21 mg  21 mg Transdermal Daily Rachael Fee, MD   21 mg at 04/12/14 0855  . ondansetron (ZOFRAN-ODT) disintegrating tablet 8 mg  8 mg Oral Q8H PRN Rachael Fee, MD      . pantoprazole (PROTONIX) EC tablet 80 mg  80 mg Oral Daily Shuvon Rankin, NP   80 mg at 04/12/14 0855  . QUEtiapine (SEROQUEL) tablet 100 mg  100 mg Oral QHS Rachael Fee, MD      . sertraline (ZOLOFT) tablet 100 mg  100 mg Oral Daily Rachael Fee, MD   100 mg at 04/12/14 0855  . zolpidem (AMBIEN) tablet 10 mg  10 mg Oral QHS Shuvon Rankin, NP   10 mg at 04/11/14 2113    Lab Results: No results found for this or any previous visit (from the past 48 hour(s)).  Physical Findings: AIMS: Facial and Oral Movements Muscles of Facial Expression: None, normal Lips and Perioral Area: None, normal Jaw: None, normal Tongue: None, normal,Extremity Movements Upper (arms, wrists, hands, fingers): None, normal Lower (legs, knees, ankles, toes): None, normal, Trunk Movements Neck, shoulders, hips: None, normal, Overall Severity Severity of abnormal movements (highest score from questions above): None, normal Incapacitation due to abnormal movements: None, normal Patient's awareness of abnormal movements (rate only patient's report): No Awareness, Dental Status Current problems with teeth and/or dentures?: No Does patient usually wear dentures?: No  CIWA:    COWS:     Treatment Plan Summary: Daily contact with patient to assess and evaluate symptoms and progress in treatment Medication management  Plan: Supportive approach/coping skills           CBT: mindfulness           Will increase the Seroquel to 100 mg HS           Will make an extra Klonopin available for acute panic           C/O of persistence of the yeast infection: will address           Will continue the Lamictal at 25 (still c/o of nausea when she takes it)  Medical Decision Making Problem  Points:  Review of psycho-social stressors (1) Data Points:  Review of medication regiment & side effects (2) Review of new medications or change in dosage (2)  I certify that inpatient services furnished can reasonably be expected to improve the patient's condition.   Dawnell Bryant A 04/12/2014, 2:11 PM

## 2014-04-12 NOTE — [facility] Group Notes (Signed)
Adult Psychoeducational Group Note  Date:  04/12/2014 Time:  9:26 PM  Group Topic/Focus:  Wrap-Up Group:   The focus of this group is to help patients review their daily goal of treatment and discuss progress on daily workbooks.  Participation Level:  Did not attend  Participation Quality: None  Affect:  None  Cognitive:  None  Insight: None  Engagement in Group:  None  Modes of Intervention:  Discussion  Additional Comments:  Seini did not attend group.  Caroll Rancher A 04/12/2014, 9:26 PM

## 2014-04-12 NOTE — [facility] Group Notes (Signed)
[facility] LCSW Group Therapy  Feelings Around Relapse 1:15 -2:30        04/12/2014   Type of Therapy:  Group Therapy  Participation Level:  Appropriate  Participation Quality:  Appropriate  Affect:  Depressed  Cognitive:  Attentive Appropriate  Insight:  Developing/Improving  Engagement in Therapy: Developing/Improving  Modes of Intervention:  Discussion Exploration Problem-Solving Supportive  Summary of Progress/Problems:  The topic for today was feelings around relapse.  Patient processed feelings toward relapse and was able to relate to peers. She shared she would be isolating herself. Patient shared she has good support from her boyfriend who encourages her to get out and takes walks. Patient identified coping skills that can be used to prevent a relapse.   Wynn Banker 04/12/2014

## 2014-04-12 NOTE — Tx Team (Signed)
Interdisciplinary Treatment Plan Update (Adult) Date: 04/12/2014   Time Reviewed: 9:30 AM  Progress in Treatment: Attending groups: Yes Participating in groups: Yes Taking medication as prescribed: Yes Tolerating medication: Yes Family/Significant other contact made: Pt refused to consent to family contact Patient understands diagnosis: Yes Discussing patient identified problems/goals with staff: Yes Medical problems stabilized or resolved: Yes Denies suicidal/homicidal ideation: Yes Issues/concerns per patient self-inventory: Yes Other:  New problem(s) identified: 04/12/2014 11:43 AM Pt continues to endorse SI but is able to contract for safety. Depression, hopelessness 10.   Discharge Plan or Barriers: Patient to discharge home with boyfriend and follow up with [facility] for therapy and medication management.  Reason for Continuation of Hospitalization:  Mood stabilization Medication Stabilization   Comments: N/A  Estimated length of stay: 3-4 days   For review of initial/current patient goals, please see plan of care. Patient and CSW reviewed pt's identified goals and treatment plan. Patient's goal is to stabilize her medications and decrease her depression. Pt verbalized understanding and agreed to treatment plan.     Attendees:  Patient:    Family:    Physician: Dr. Rosezella Rumpf 04/12/2014 11:27 AM   Nursing: Lowanda Foster RN 04/12/2014 11:27 AM   Clinical Social Worker: Trula Slade, LCSWA 04/12/2014 11:27 AM   Other: Juline Patch, LCSW  04/12/2014 11:27 AM   Other: Tomasita Morrow, Care Coordinator 04/12/2014 11:27 AM   Other: Onnie Boer, Case Manager  04/12/2014 11:27 AM   Other: Chandra Batch. PA 04/12/2014 11:27 AM   Other: Alexia Freestone RN 04/12/2014 11:27 AM   Other: Santa Genera, LCSW 04/12/2014 11:27 AM   Other:    Other:    Other:      Scribe for Treatment Team:  Trula Slade, LCSWA 04/12/2014 9:48 AM

## 2014-04-12 NOTE — Progress Notes (Signed)
Adult Psychoeducational Group Note  Date:  04/12/2014 Time:  11:18 AM  Group Topic/Focus:  Relapse Prevention Planning:   The focus of this group is to define relapse and discuss the need for planning to combat relapse.  Participation Level:  Did Not Attend   Dalia Heading 04/12/2014, 11:18 AM

## 2014-04-12 NOTE — Progress Notes (Signed)
D   Pt endorses depression and anxiety    She is cooperative and pleasant and interacts appropriately with her peers   She did attend group this evening and is compliant with treatment A   Verbal support given   Medications administered and effectiveness monitored    Q 15 min checks R   Pt safe at present

## 2014-04-12 NOTE — [facility] Group Notes (Signed)
[facility] LCSW Aftercare Discharge Planning Group Note   04/12/2014 9:46 AM  Participation Quality:  Did not attend group - patient in bed.  [name], [name]

## 2014-04-13 MED ORDER — LOPERAMIDE HCL 2 MG PO CAPS
2.0000 mg | ORAL_CAPSULE | ORAL | Status: AC | PRN
  Administered 2014-04-13: 2 mg via ORAL
  Filled 2014-04-13: qty 1

## 2014-04-13 NOTE — [facility] Group Notes (Signed)
[facility] Group Notes:  (Nursing/MHT/Case Management/Adjunct)  Date:  04/13/2014  Time:  1:39 PM  Type of Therapy:  Psychoeducational Skills  Participation Level:  Active  Participation Quality:  Appropriate  Affect:  Appropriate  Cognitive:  Appropriate  Insight:  Appropriate  Engagement in Group:  Engaged  Modes of Intervention:  Discussion  Summary of Progress/Problems: Pt did attend self inventory group, pt reported that she was negative HI, no AH/VH noted. Pt reported that she was positive SI, however contracts for safety. Pt rated her depression as a 10, and her helplessness/hopelessness as a 10.     Pt reported concerns about loose stools, pt advised that the doctor will be made aware.  Jacquelyne Balint Shanta 04/13/2014, 1:39 PM

## 2014-04-13 NOTE — Plan of Care (Signed)
Problem: Diagnosis: Increased Risk For Suicide Attempt Goal: STG-Patient Will Attend All Groups On The Unit Outcome: Not Progressing Patient did not attend group this evening.     
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
## 2014-04-13 NOTE — [facility] Group Notes (Signed)
[facility] Group Notes:  (Clinical Social Work)  04/13/2014     10-11AM  Summary of Progress/Problems:   The main focus of today's process group was to learn how to use a decisional balance exercise to move forward in the Stages of Change, which were described and discussed.  Motivational Interviewing and a worksheet were utilized to help patients explore in depth the perceived benefits and costs of a self-sabotaging behavior, as well as the  benefits and costs of replacing that with a healthy coping mechanism.   The patient expressed that she is in the hospital for depression and suicidal ideation, reporting that if she had stayed on her medications, she may not have gone into crisis.  She feels she is in Preparation Stage of Change, and is going back on her medications.  Type of Therapy:  Group Therapy - Process   Participation Level:  Active  Participation Quality:  Attentive, Sharing and Supportive  Affect:  Blunted and Depressed  Cognitive:  Appropriate and Oriented  Insight:  Engaged  Engagement in Therapy:  Engaged  Modes of Intervention:  Education, Motivational Interviewing  Ambrose Mantle, LCSW 04/13/2014, 12:22 PM

## 2014-04-13 NOTE — Progress Notes (Signed)
Did not attend group 

## 2014-04-13 NOTE — Progress Notes (Signed)
Nursing Shift Note  D: depressed mood, increased anxiety. Pt reports she slept fair, poor appetite, passive SI. Denies HI, AVH. Complains of diarrhea, prn administered. A: pt did attend group. Medication compliant. Cooperative with staff instructions. Safety maintained. R: pt is in a safe and therapeutic environment. Pt daily goal is to feel better and rest. No acute distress noted. Will continue to stabilize, monitor and follow treatment plan.

## 2014-04-13 NOTE — Progress Notes (Signed)
Writer entered patients room and observed her sitting in bed reading. She did not attend group reporting that she still had a slight headache from earlier today. She reports passive si and verbally contracts for safety. She denies hi/a/v hallucinations. Writer informed her of scheduled medications and she is agreeable to taking them and request a prn klonopin with them. She plans to f/u with [facility] once discharged and stay on her medications. She reports that the reason she did not f/u with [facility] was because she did not have transportation. Writer encouraged her to let her social worker know if transportation would be a problem before discharge. Patient was receptive. Safety maintained on unit with 15 min checks.

## 2014-04-13 NOTE — [facility] Group Notes (Signed)
[facility] Group Notes:  (Nursing/MHT/Case Management/Adjunct)  Date:  04/13/2014  Time:  2:42 PM  Type of Therapy:  Psychoeducational Skills  Participation Level:  Active  Participation Quality:  Appropriate  Affect:  Appropriate  Cognitive:  Appropriate  Insight:  Appropriate  Engagement in Group:  Engaged  Modes of Intervention:  Discussion  Summary of Progress/Problems: Pt did attend healthy coping skills group.  Jozi Malachi Shanta 04/13/2014, 2:42 PM 

## 2014-04-14 MED ORDER — LAMOTRIGINE 25 MG PO TABS
25.0000 mg | ORAL_TABLET | Freq: Two times a day (BID) | ORAL | Status: DC
  Administered 2014-04-14 – 2014-04-16 (×4): 25 mg via ORAL
  Filled 2014-04-14 (×6): qty 1
  Filled 2014-04-14 (×2): qty 6

## 2014-04-14 MED ORDER — QUETIAPINE FUMARATE 50 MG PO TABS
150.0000 mg | ORAL_TABLET | Freq: Every day | ORAL | Status: DC
  Administered 2014-04-14: 150 mg via ORAL
  Filled 2014-04-14 (×2): qty 1

## 2014-04-14 NOTE — [facility] Group Notes (Signed)
[facility] Group Notes: (Clinical Social Work)   04/14/2014      Type of Therapy:  Group Therapy   Participation Level:  Did Not Attend - was sleeping   Ambrose Mantle, LCSW 04/14/2014, 12:12 PM

## 2014-04-14 NOTE — Plan of Care (Signed)
Problem: Diagnosis: Increased Risk For Suicide Attempt Goal: STG-Patient Will Report Suicidal Feelings to Staff Outcome: Progressing Patient verbalized suicidal thoughts with Clinical research associate and verbally contracts for safety.

## 2014-04-14 NOTE — [facility] Group Notes (Signed)
[facility] Group Notes:  (Nursing/MHT/Case Management/Adjunct)  Date:  04/14/2014  Time:  3:55 PM  Type of Therapy:  Psychoeducational Skills- Pt Self Inventory Group.   Participation Level:  Did Not Attend    Buford Dresser 04/14/2014, 3:55 PM

## 2014-04-14 NOTE — Progress Notes (Signed)
Patient did not attend the evening speaker AA meeting.  

## 2014-04-14 NOTE — Progress Notes (Signed)
Writer observed patient lying in bed reading during AA group. Patient reports having had a better day and is compliant with her scheduled medications. She requested klonopin along with her medications she received. Patient voiced no other complaints and returned to her room to rest. She reports passive si and verbally contracts for safety. She denies hi/a/v hallucinations. Support and encouragement given, safety maintained on unit with 15 min checks.

## 2014-04-14 NOTE — Plan of Care (Signed)
Problem: Diagnosis: Increased Risk For Suicide Attempt Goal: STG-Patient Will Comply With Medication Regime Outcome: Progressing Patient is compliant with medications.      

## 2014-04-14 NOTE — Plan of Care (Signed)
Problem: Diagnosis: Increased Risk For Suicide Attempt Goal: STG-Patient Will Comply With Medication Regime Outcome: Progressing Patient is compliant with scheduled medications.     

## 2014-04-14 NOTE — [facility] Group Notes (Signed)
[facility] Group Notes:  (Nursing/MHT/Case Management/Adjunct)  Date:  04/14/2014  Time:  3:57 PM    Type of Therapy:  Psychoeducational Skills- Healthy Support Systems.   Participation Level:  Did Not Attend  [name] 04/14/2014, 3:57 PM

## 2014-04-14 NOTE — Plan of Care (Signed)
Problem: Diagnosis: Increased Risk For Suicide Attempt Goal: STG-Patient Will Report Suicidal Feelings to Staff Outcome: Progressing Patient verbalizes suicidal thoughts and verbally contracts with staff.

## 2014-04-14 NOTE — Clinical Social Work Note (Signed)
LATE ENTRY FOR 04/13/14  Clinical Social Work Note  At patient's request, CSW provided clothing to patient from the clothing closet, including long pants, t-shirt and hoodie.    Ambrose Mantle, LCSW 04/14/2014, 4:07 PM

## 2014-04-14 NOTE — Progress Notes (Signed)
[facility] MD Progress Note  04/14/2014 4:47 PM Davonda Djuna Frechette  MRN:  [id number] Subjective:  Topaz states she is still not sleeping well. Still admits to ruminative thinking that does not let her go back to sleep when she wakes up middle of the night. She is still endorsing mood instability and suicidal ideas although she would not act on them Diagnosis:   DSM5:  Depressive Disorders:  Major Depressive Disorder - Severe (296.23) Total Time spent with patient: 30 minutes  Axis I: Panic Disorder  ADL's:  Intact  Sleep: Poor  Appetite:  Fair  Psychiatric Specialty Exam: Physical Exam  Review of Systems  Constitutional: Negative.   HENT: Negative.   Eyes: Negative.   Respiratory: Negative.   Cardiovascular: Negative.   Gastrointestinal: Negative.   Genitourinary: Negative.   Musculoskeletal: Negative.   Skin: Negative.   Neurological: Negative.   Endo/Heme/Allergies: Negative.   Psychiatric/Behavioral: Positive for depression, suicidal ideas and substance abuse. The patient is nervous/anxious and has insomnia.     Blood pressure 128/58, pulse 108, temperature 97.8 F (36.6 C), temperature source Oral, resp. rate 18, height  (1.6 m), weight 89.812 kg (198 lb), last menstrual period 03/23/2014, SpO2 98.00%.Body mass index is 35.08 kg/(m^2).  General Appearance: Fairly Groomed  Patent attorney::  Fair  Speech:  Clear and Coherent  Volume:  Decreased  Mood:  Depressed  Affect:  Restricted  Thought Process:  Coherent and Goal Directed  Orientation:  Full (Time, Place, and Person)  Thought Content:  symptoms worries concens  Suicidal Thoughts:  No  Homicidal Thoughts:  No  Memory:  Immediate;   Fair Recent;   Fair Remote;   Fair  Judgement:  Fair  Insight:  Present  Psychomotor Activity:  Decreased  Concentration:  Fair  Recall:  [name] of Knowledge:NA  Language: Fair  Akathisia:  No  Handed:    AIMS (if indicated):     Assets:  Desire for Improvement  Sleep:   Number of Hours: 5.5   Musculoskeletal: Strength & Muscle Tone: within normal limits Gait & Station: normal Patient leans: N/A  Current Medications: Current Facility-Administered Medications  Medication Dose Route Frequency Provider Last Rate Last Dose  . acetaminophen (TYLENOL) tablet 650 mg  650 mg Oral Q6H PRN Rachael Fee, MD   650 mg at 04/12/14 1441  . albuterol (PROVENTIL HFA;VENTOLIN HFA) 108 (90 BASE) MCG/ACT inhaler 2 puff  2 puff Inhalation Q4H PRN Shuvon Rankin, NP      . alum & mag hydroxide-simeth (MAALOX/MYLANTA) 200-200-20 MG/5ML suspension 30 mL  30 mL Oral Q4H PRN Rachael Fee, MD   30 mL at 04/09/14 0848  . aspirin-acetaminophen-caffeine (EXCEDRIN MIGRAINE) per tablet 2 tablet  2 tablet Oral Daily PRN Shuvon Rankin, NP   2 tablet at 04/12/14 0858  . clonazePAM (KLONOPIN) tablet 1 mg  1 mg Oral TID PRN Rachael Fee, MD   1 mg at 04/14/14 1333  . clotrimazole (GYNE-LOTRIMIN) vaginal cream 1 Applicatorful  1 Applicatorful Vaginal QHS Rachael Fee, MD   1 Applicatorful at 04/13/14 2107  . feeding supplement (ENSURE COMPLETE) (ENSURE COMPLETE) liquid 237 mL  237 mL Oral BID BM Tenny Craw, RD   237 mL at 04/12/14 1400  . hydrOXYzine (ATARAX/VISTARIL) tablet 25 mg  25 mg Oral Q6H PRN Shuvon Rankin, NP   25 mg at 04/14/14 1333  . lamoTRIgine (LAMICTAL) tablet 25 mg  25 mg Oral BID Rachael Fee, MD      .  loperamide (IMODIUM) capsule 2-4 mg  2-4 mg Oral PRN Canary Brim, NP   2 mg at 04/13/14 1105  . magnesium hydroxide (MILK OF MAGNESIA) suspension 30 mL  30 mL Oral Daily PRN Shuvon Rankin, NP      . nicotine (NICODERM CQ - dosed in mg/24 hours) patch 21 mg  21 mg Transdermal Daily Rachael Fee, MD   21 mg at 04/14/14 0802  . ondansetron (ZOFRAN-ODT) disintegrating tablet 8 mg  8 mg Oral Q8H PRN Rachael Fee, MD   8 mg at 04/14/14 0803  . pantoprazole (PROTONIX) EC tablet 80 mg  80 mg Oral Daily Shuvon Rankin, NP   80 mg at 04/14/14 0802  . QUEtiapine (SEROQUEL)  tablet 150 mg  150 mg Oral QHS Rachael Fee, MD      . sertraline (ZOLOFT) tablet 100 mg  100 mg Oral Daily Rachael Fee, MD   100 mg at 04/14/14 1610  . zolpidem (AMBIEN) tablet 10 mg  10 mg Oral QHS Shuvon Rankin, NP   10 mg at 04/13/14 2107    Lab Results: No results found for this or any previous visit (from the past 48 hour(s)).  Physical Findings: AIMS: Facial and Oral Movements Muscles of Facial Expression: None, normal Lips and Perioral Area: None, normal Jaw: None, normal Tongue: None, normal,Extremity Movements Upper (arms, wrists, hands, fingers): None, normal Lower (legs, knees, ankles, toes): None, normal, Trunk Movements Neck, shoulders, hips: None, normal, Overall Severity Severity of abnormal movements (highest score from questions above): None, normal Incapacitation due to abnormal movements: None, normal Patient's awareness of abnormal movements (rate only patient's report): No Awareness, Dental Status Current problems with teeth and/or dentures?: No Does patient usually wear dentures?: No  CIWA:    COWS:     Treatment Plan Summary: Daily contact with patient to assess and evaluate symptoms and progress in treatment Medication management  Plan: Supportive approach/coping skills           Will increase the Seroquel to 150 mg HS           Will increase the Lamictal to 25 mg BID  Medical Decision Making Problem Points:  Review of psycho-social stressors (1) Data Points:  Review of medication regiment & side effects (2) Review of new medications or change in dosage (2)  I certify that inpatient services furnished can reasonably be expected to improve the patient's condition.   Creta Dorame A 04/14/2014, 4:47 PM

## 2014-04-14 NOTE — Plan of Care (Signed)
Problem: Ineffective individual coping Goal: STG: Patient will remain free from self harm Outcome: Progressing Patient has remained free from self harm.     

## 2014-04-14 NOTE — Progress Notes (Signed)
Writer has observed patient up in the dayroom after group with minimal interaction with peers. She requested her scheduled medications along with a prn klonopin for anxiety which she received. Patient reports passive si and verbally contracts for safety, she denies hi/a/v hallucinations. Support and encouragement given, compliant with medications. Safety maintained on unit with 15 min checks.

## 2014-04-15 MED ORDER — QUETIAPINE FUMARATE 200 MG PO TABS
200.0000 mg | ORAL_TABLET | Freq: Every day | ORAL | Status: DC
  Administered 2014-04-15: 200 mg via ORAL
  Filled 2014-04-15: qty 1
  Filled 2014-04-15: qty 3
  Filled 2014-04-15: qty 1

## 2014-04-15 NOTE — [facility] Group Notes (Signed)
[facility] LCSW Aftercare Discharge Planning Group Note   04/15/2014 10:01 AM  Participation Quality:  Appropriate   Mood/Affect:  Appropriate  Depression Rating:  7  Anxiety Rating:  8  Thoughts of Suicide:  No Will you contract for safety?   NA  Current AVH:  No  Plan for Discharge/Comments:  Pt reports that she would prefer to go to [facility] rather than [facility] for services. She is sleeping well but reports high dep/anx still. Will need letter for parole officer at d/c.   Transportation Means: bus pass   Supports: Data processing manager, Oncologist

## 2014-04-15 NOTE — Plan of Care (Signed)
Problem: Ineffective individual coping Goal: STG-Increase in ability to manage activities of daily living Outcome: Completed/Met Date Met:  04/15/14 Patient is able to complete activities of daily living. Patient has no issues with ADL's

## 2014-04-15 NOTE — Progress Notes (Signed)
[facility] MD Progress Note  04/15/2014 4:11 PM [name]  MRN:  [id number] Subjective:  [name] still endorses fragmented sleep as well as mood instability. She admits to underlying suicidal ideas but does not endorse plans or intent. She would like for the thoughts to go away and hopes that when the mood stabilizer works the ideas would go away. States she has tolerated the increase in Seroquel and would like for it to be increased as she would like to get help with the mood her racing thoughts and her suicidal ideas. Diagnosis:   DSM5: Depressive Disorders:  Major Depressive Disorder - Severe (296.23) Total Time spent with patient: 30 minutes  Axis I: Generalized Anxiety Disorder and Panic Disorder  ADL's:  Intact  Sleep: Poor  Appetite:  Fair Psychiatric Specialty Exam: Physical Exam  Review of Systems  Constitutional: Negative.   HENT: Negative.   Eyes: Negative.   Respiratory: Negative.   Cardiovascular: Negative.   Gastrointestinal: Negative.   Genitourinary: Negative.   Musculoskeletal: Negative.   Skin: Negative.   Neurological: Negative.   Endo/Heme/Allergies: Negative.   Psychiatric/Behavioral: Positive for depression and suicidal ideas. The patient is nervous/anxious and has insomnia.     Blood pressure 131/66, pulse 97, temperature 98.7 F (37.1 C), temperature source Oral, resp. rate 16, height  (1.6 m), weight 89.812 kg (198 lb), last menstrual period 03/23/2014, SpO2 98.00%.Body mass index is 35.08 kg/(m^2).  General Appearance: Fairly Groomed  Patent attorney::  Fair  Speech:  Clear and Coherent  Volume:  Decreased  Mood:  Anxious and Depressed  Affect:  Restricted  Thought Process:  Coherent and Goal Directed  Orientation:  Full (Time, Place, and Person)  Thought Content:  worries concerns symptoms   Suicidal Thoughts:  Yes.  without intent/plan  Homicidal Thoughts:  No  Memory:  Immediate;   Fair Recent;   Fair Remote;   Fair  Judgement:  Fair   Insight:  Present and Shallow  Psychomotor Activity:  Restlessness  Concentration:  Fair  Recall:  [name] of Knowledge:NA  Language: Fair  Akathisia:  No  Handed:    AIMS (if indicated):     Assets:  Desire for Improvement  Sleep:  Number of Hours: 5.75   Musculoskeletal: Strength & Muscle Tone: within normal limits Gait & Station: normal Patient leans: N/A  Current Medications: Current Facility-Administered Medications  Medication Dose Route Frequency Provider Last Rate Last Dose  . acetaminophen (TYLENOL) tablet 650 mg  650 mg Oral Q6H PRN Rachael Fee, MD   650 mg at 04/12/14 1441  . albuterol (PROVENTIL HFA;VENTOLIN HFA) 108 (90 BASE) MCG/ACT inhaler 2 puff  2 puff Inhalation Q4H PRN Shuvon Rankin, NP      . alum & mag hydroxide-simeth (MAALOX/MYLANTA) 200-200-20 MG/5ML suspension 30 mL  30 mL Oral Q4H PRN Rachael Fee, MD   30 mL at 04/09/14 0848  . aspirin-acetaminophen-caffeine (EXCEDRIN MIGRAINE) per tablet 2 tablet  2 tablet Oral Daily PRN Shuvon Rankin, NP   2 tablet at 04/15/14 0851  . clonazePAM (KLONOPIN) tablet 1 mg  1 mg Oral TID PRN Rachael Fee, MD   1 mg at 04/15/14 1452  . clotrimazole (GYNE-LOTRIMIN) vaginal cream 1 Applicatorful  1 Applicatorful Vaginal QHS Rachael Fee, MD   1 Applicatorful at 04/14/14 2157  . feeding supplement (ENSURE COMPLETE) (ENSURE COMPLETE) liquid 237 mL  237 mL Oral BID BM Tenny Craw, RD   237 mL at 04/14/14 1922  .  hydrOXYzine (ATARAX/VISTARIL) tablet 25 mg  25 mg Oral Q6H PRN Shuvon Rankin, NP   25 mg at 04/14/14 1333  . lamoTRIgine (LAMICTAL) tablet 25 mg  25 mg Oral BID Rachael Fee, MD   25 mg at 04/15/14 [phone]  . loperamide (IMODIUM) capsule 2-4 mg  2-4 mg Oral PRN Canary Brim, NP   2 mg at 04/13/14 1105  . magnesium hydroxide (MILK OF MAGNESIA) suspension 30 mL  30 mL Oral Daily PRN Shuvon Rankin, NP      . nicotine (NICODERM CQ - dosed in mg/24 hours) patch 21 mg  21 mg Transdermal Daily Rachael Fee, MD   21 mg  at 04/15/14 0853  . ondansetron (ZOFRAN-ODT) disintegrating tablet 8 mg  8 mg Oral Q8H PRN Rachael Fee, MD   8 mg at 04/14/14 0803  . pantoprazole (PROTONIX) EC tablet 80 mg  80 mg Oral Daily Shuvon Rankin, NP   80 mg at 04/15/14 0852  . QUEtiapine (SEROQUEL) tablet 200 mg  200 mg Oral QHS Rachael Fee, MD      . sertraline (ZOLOFT) tablet 100 mg  100 mg Oral Daily Rachael Fee, MD   100 mg at 04/15/14 9604  . zolpidem (AMBIEN) tablet 10 mg  10 mg Oral QHS Shuvon Rankin, NP   10 mg at 04/14/14 2157    Lab Results: No results found for this or any previous visit (from the past 48 hour(s)).  Physical Findings: AIMS: Facial and Oral Movements Muscles of Facial Expression: None, normal Lips and Perioral Area: None, normal Jaw: None, normal Tongue: None, normal,Extremity Movements Upper (arms, wrists, hands, fingers): None, normal Lower (legs, knees, ankles, toes): None, normal, Trunk Movements Neck, shoulders, hips: None, normal, Overall Severity Severity of abnormal movements (highest score from questions above): None, normal Incapacitation due to abnormal movements: None, normal Patient's awareness of abnormal movements (rate only patient's report): No Awareness, Dental Status Current problems with teeth and/or dentures?: No Does patient usually wear dentures?: No  CIWA:    COWS:     Treatment Plan Summary: Daily contact with patient to assess and evaluate symptoms and progress in treatment Medication management  Plan: Supportive approach/coping skills           Will increase the Seroquel to 200 mg HS  Medical Decision Making Problem Points:  Review of psycho-social stressors (1) Data Points:  Review of medication regiment & side effects (2) Review of new medications or change in dosage (2)  I certify that inpatient services furnished can reasonably be expected to improve the patient's condition.   Idell Hissong A 04/15/2014, 4:11 PM

## 2014-04-15 NOTE — [facility] Group Notes (Signed)
[facility] LCSW Group Therapy  04/15/2014 3:29 PM  Type of Therapy:  Group Therapy  Participation Level: Active   Participation Quality:  Attentive  Affect:  Appropriate  Cognitive:  Oriented  Insight:  Improving  Engagement in Therapy:  Improving  Modes of Intervention:  Confrontation, Discussion, Education, Exploration, Limit-setting, Problem-solving, Rapport Building, Socialization and Support  Summary of Progress/Problems: Today's Topic: Overcoming Obstacles. Pt identified obstacles faced currently and processed barriers involved in overcoming these obstacles. Pt identified steps necessary for overcoming these obstacles and explored motivation (internal and external) for facing these difficulties head on. Pt further identified one area of concern in their lives and chose a skill of focus pulled from their "toolbox." [name] was attentive and engaged during today's therapy group. She shared that her obstacle is "I isolate myself all the time. I don't even know what I'm scare of, but I keep to myself and stay away from other people." [name] shows progress in the group setting and improving insight AEB her ability to process how isolating leads to depression and loneliness, thus continuing the cycle of isolation and depression. [name] was able to identify one thing that she plans to do in order to overcome this obstacle. "I am going to [facility] [place] rather than [place] when I leave." She stated that she plans to join the support groups at [facility] and is hoping to have the same therapist each visit at this agency.   Smart, Melisssa Donner LCSWA  04/15/2014, 3:29 PM

## 2014-04-15 NOTE — Progress Notes (Signed)
Patient ID: Ginni Eichler, female   DOB: [birth year], 38 y.o.   MRN: [id number]  D: Pt. Denies HI and A/V Hallucinations. Patient reports passive SI with no plan but contracts for safety. Patient reports headache and received PRN Excedrin but no change upon reassessment. Patient also reported anxiety and received PRN Klonopin that offered relief to patient. Patient refused to fill out daily inventory sheet today.  A: Support and encouragement provided to the patient to attend morning group although patient had a headache and did not want to attend. Scheduled medications administered to patient per physician's orders.  R: Patient is receptive and cooperative but minimal with Clinical research associate. Patient is seen mostly laying in bed but did attend morning group with LCSW. Q15 minute checks are maintained for safety.

## 2014-04-15 NOTE — Progress Notes (Signed)
Adult Psychoeducational Group Note  Date:  04/15/2014 Time: 8:15pm Group Topic/Focus:  Wrap-Up Group:   The focus of this group is to help patients review their daily goal of treatment and discuss progress on daily workbooks.  Participation Level:  Did Not Attend   Additional Comments: Pt. Didn't attend group.   Bing Plume D 04/15/2014, 9:36 PM

## 2014-04-16 ENCOUNTER — Emergency Department (HOSPITAL_COMMUNITY): Payer: Medicaid Other

## 2014-04-16 ENCOUNTER — Encounter (HOSPITAL_COMMUNITY): Payer: Self-pay | Admitting: Emergency Medicine

## 2014-04-16 ENCOUNTER — Emergency Department (HOSPITAL_COMMUNITY)
Admission: EM | Admit: 2014-04-16 | Discharge: 2014-04-17 | Disposition: A | Discharge status: Home / Self Care | Payer: Medicaid Other | Attending: Emergency Medicine | Admitting: Emergency Medicine

## 2014-04-16 DIAGNOSIS — Z7982 Long term (current) use of aspirin: Secondary | ICD-10-CM | POA: Insufficient documentation

## 2014-04-16 DIAGNOSIS — Z3202 Encounter for pregnancy test, result negative: Secondary | ICD-10-CM | POA: Insufficient documentation

## 2014-04-16 DIAGNOSIS — Z9089 Acquired absence of other organs: Secondary | ICD-10-CM | POA: Insufficient documentation

## 2014-04-16 DIAGNOSIS — F17200 Nicotine dependence, unspecified, uncomplicated: Secondary | ICD-10-CM | POA: Insufficient documentation

## 2014-04-16 DIAGNOSIS — K219 Gastro-esophageal reflux disease without esophagitis: Secondary | ICD-10-CM | POA: Insufficient documentation

## 2014-04-16 DIAGNOSIS — Z87440 Personal history of urinary (tract) infections: Secondary | ICD-10-CM | POA: Insufficient documentation

## 2014-04-16 DIAGNOSIS — Z9889 Other specified postprocedural states: Secondary | ICD-10-CM | POA: Insufficient documentation

## 2014-04-16 DIAGNOSIS — Z8742 Personal history of other diseases of the female genital tract: Secondary | ICD-10-CM | POA: Insufficient documentation

## 2014-04-16 DIAGNOSIS — F411 Generalized anxiety disorder: Secondary | ICD-10-CM | POA: Insufficient documentation

## 2014-04-16 DIAGNOSIS — Z8639 Personal history of other endocrine, nutritional and metabolic disease: Secondary | ICD-10-CM | POA: Insufficient documentation

## 2014-04-16 DIAGNOSIS — Z79899 Other long term (current) drug therapy: Secondary | ICD-10-CM | POA: Insufficient documentation

## 2014-04-16 DIAGNOSIS — R319 Hematuria, unspecified: Secondary | ICD-10-CM | POA: Insufficient documentation

## 2014-04-16 DIAGNOSIS — R109 Unspecified abdominal pain: Secondary | ICD-10-CM | POA: Insufficient documentation

## 2014-04-16 DIAGNOSIS — F332 Major depressive disorder, recurrent severe without psychotic features: Principal | ICD-10-CM

## 2014-04-16 DIAGNOSIS — Z8709 Personal history of other diseases of the respiratory system: Secondary | ICD-10-CM | POA: Insufficient documentation

## 2014-04-16 DIAGNOSIS — R51 Headache: Secondary | ICD-10-CM | POA: Insufficient documentation

## 2014-04-16 DIAGNOSIS — F319 Bipolar disorder, unspecified: Secondary | ICD-10-CM | POA: Insufficient documentation

## 2014-04-16 DIAGNOSIS — Z862 Personal history of diseases of the blood and blood-forming organs and certain disorders involving the immune mechanism: Secondary | ICD-10-CM | POA: Insufficient documentation

## 2014-04-16 LAB — URINALYSIS, ROUTINE W REFLEX MICROSCOPIC
Bilirubin Urine: NEGATIVE
Glucose, UA: NEGATIVE mg/dL
Ketones, ur: NEGATIVE mg/dL
Nitrite: NEGATIVE
Protein, ur: 30 mg/dL — AB
Specific Gravity, Urine: 1.01 (ref 1.005–1.030)
Urobilinogen, UA: 1 mg/dL (ref 0.0–1.0)
pH: 7 (ref 5.0–8.0)

## 2014-04-16 LAB — I-STAT CHEM 8, ED
BUN: 5 mg/dL — ABNORMAL LOW (ref 6–23)
Calcium, Ion: 1.08 mmol/L — ABNORMAL LOW (ref 1.12–1.23)
Chloride: 104 mEq/L (ref 96–112)
Creatinine, Ser: 0.6 mg/dL (ref 0.50–1.10)
Glucose, Bld: 117 mg/dL — ABNORMAL HIGH (ref 70–99)
HCT: 40 % (ref 36.0–46.0)
Hemoglobin: 13.6 g/dL (ref 12.0–15.0)
Potassium: 3.9 mEq/L (ref 3.7–5.3)
Sodium: 140 mEq/L (ref 137–147)
TCO2: 24 mmol/L (ref 0–100)

## 2014-04-16 LAB — RAPID URINE DRUG SCREEN, HOSP PERFORMED
Amphetamines: NOT DETECTED
Barbiturates: NOT DETECTED
Benzodiazepines: NOT DETECTED
Cocaine: NOT DETECTED
Opiates: NOT DETECTED
Tetrahydrocannabinol: NOT DETECTED

## 2014-04-16 LAB — PREGNANCY, URINE: Preg Test, Ur: NEGATIVE

## 2014-04-16 LAB — URINE MICROSCOPIC-ADD ON

## 2014-04-16 MED ORDER — QUETIAPINE FUMARATE 200 MG PO TABS: 200.0000 mg | ORAL_TABLET | Freq: Every day | ORAL | Status: DC

## 2014-04-16 MED ORDER — KETOROLAC TROMETHAMINE 30 MG/ML IJ SOLN
30.0000 mg | Freq: Once | INTRAMUSCULAR | Status: AC
  Administered 2014-04-16: 30 mg via INTRAVENOUS
  Filled 2014-04-16: qty 1

## 2014-04-16 MED ORDER — METRONIDAZOLE 500 MG PO TABS
2000.0000 mg | ORAL_TABLET | Freq: Once | ORAL | Status: AC
  Administered 2014-04-17: 2000 mg via ORAL
  Filled 2014-04-16: qty 4

## 2014-04-16 MED ORDER — ZOLPIDEM TARTRATE 10 MG PO TABS: 10.0000 mg | ORAL_TABLET | Freq: Every day | ORAL | Status: AC

## 2014-04-16 MED ORDER — SODIUM CHLORIDE 0.9 % IV BOLUS (SEPSIS)
1000.0000 mL | INTRAVENOUS | Status: AC
  Administered 2014-04-16: 1000 mL via INTRAVENOUS

## 2014-04-16 MED ORDER — LIDOCAINE HCL (PF) 1 % IJ SOLN
INTRAMUSCULAR | Status: AC
  Filled 2014-04-16: qty 5

## 2014-04-16 MED ORDER — AZITHROMYCIN 250 MG PO TABS
1000.0000 mg | ORAL_TABLET | Freq: Once | ORAL | Status: AC
  Administered 2014-04-16: 1000 mg via ORAL
  Filled 2014-04-16: qty 4

## 2014-04-16 MED ORDER — ZOLPIDEM TARTRATE 10 MG PO TABS: 10.0000 mg | ORAL_TABLET | Freq: Every day | ORAL | Status: DC

## 2014-04-16 MED ORDER — LAMOTRIGINE 25 MG PO TABS: 25.0000 mg | ORAL_TABLET | Freq: Two times a day (BID) | ORAL | Status: DC

## 2014-04-16 MED ORDER — HYDROCODONE-ACETAMINOPHEN 5-325 MG PO TABS: 1.0000 | ORAL_TABLET | Freq: Four times a day (QID) | ORAL | Status: DC | PRN

## 2014-04-16 MED ORDER — LIDOCAINE HCL (PF) 1 % IJ SOLN
2.0000 mL | Freq: Once | INTRAMUSCULAR | Status: AC
  Administered 2014-04-16: 2 mL

## 2014-04-16 MED ORDER — IBUPROFEN 800 MG PO TABS: 800.0000 mg | ORAL_TABLET | Freq: Three times a day (TID) | ORAL | Status: DC | PRN

## 2014-04-16 MED ORDER — MORPHINE SULFATE 4 MG/ML IJ SOLN: 6.0000 mg | Freq: Once | INTRAMUSCULAR | Status: DC

## 2014-04-16 MED ORDER — CLONAZEPAM 1 MG PO TABS: 1.0000 mg | ORAL_TABLET | Freq: Three times a day (TID) | ORAL | Status: DC | PRN

## 2014-04-16 MED ORDER — CEFTRIAXONE SODIUM 250 MG IJ SOLR
250.0000 mg | Freq: Once | INTRAMUSCULAR | Status: AC
  Administered 2014-04-16: 250 mg via INTRAMUSCULAR
  Filled 2014-04-16: qty 250

## 2014-04-16 MED ORDER — SERTRALINE HCL 100 MG PO TABS: 100.0000 mg | ORAL_TABLET | Freq: Every day | ORAL | Status: DC

## 2014-04-16 NOTE — ED Notes (Signed)
Pt's speech is slurred now. She is now lethargic but easy to arouse. States the last thing she took for pain was motrin this am. Have to wake her up to ask her about her pain level which she states is still 8/10. Husband now in room. He is also lethargic sitting in chair.

## 2014-04-16 NOTE — ED Notes (Signed)
Patient transported to Ultrasound 

## 2014-04-16 NOTE — Progress Notes (Signed)
Pt has spent most of the evening in her room in bed reading.  Pt reports she is doing fine, although she still has passive suicidal thoughts that come and go.  Pt denies HI/AV.  Pt says she is working with her case manager to finalize her discharge plans.  She is unsure when she will be discharged.  She says she has been going to some groups.  She is med compliant on the unit.  Pt was encouraged to make her needs known to staff.  Support and encouragement offered.  Encouraged to participate in unit activities.  Safety maintained with q15 minute checks.

## 2014-04-16 NOTE — Tx Team (Signed)
Interdisciplinary Treatment Plan Update (Adult) Date: 04/16/2014   Time Reviewed: 9:30 AM 04/16/2014  Progress in Treatment: Attending groups: Yes Participating in groups: Yes Taking medication as prescribed: Yes Tolerating medication: Yes Family/Significant other contact made: Pt refused to consent to family contact Patient understands diagnosis: Yes Discussing patient identified problems/goals with staff: Yes Medical problems stabilized or resolved: Yes Denies suicidal/homicidal ideation: Yes Issues/concerns per patient self-inventory: Yes Other:  New problem(s) identified:  Discharge Plan or Barriers: Patient to discharge home with boyfriend and follow up with [facility] for group therapy and medication management.  Reason for Continuation of Hospitalization:  None    Comments: N/A  Estimated length of stay: today  For review of initial/current patient goals, please see plan of care. Patient and CSW reviewed pt's identified goals and treatment plan. Patient's goal is to stabilize her medications and decrease her depression. Pt verbalized understanding and agreed to treatment plan.     Attendees:  Patient:    Family:    Physician: Dr. Rosezella Rumpfobbos 04/16/2014 11:39 AM   Nursing: Rodell PernaPatrice RN  04/16/2014 11:39 AM   Clinical Social Worker: Trula SladeHeather Smart, LCSWA 04/16/2014 11:39 AM   Other: Juline PatchQuylle Hodnett, LCSW  04/16/2014 11:39 AM   Other: Deeann Creeonna S. RN 04/16/2014 11:39 AM   Other: Onnie BoerJennifer Clark, Case Manager  04/16/2014 11:39 AM   Other:  04/16/2014 11:39 AM   Other:  04/16/2014 11:39 AM   Other:  04/16/2014 11:39 AM   Other:    Other:    Other:      Scribe for Treatment Team:  Trula SladeHeather Smart, LCSWA 04/16/2014 11:39 AM

## 2014-04-16 NOTE — Progress Notes (Signed)
Writer reviewed pt discharge instructions with pt including medications, follow up care and crisis intervention. Pt acknowledged understanding of instructions and states that she has no reservations about leaving [facility] at this time. Pt denies SI/HI and AVH. Pt mood and affect are appropriate to the situation. Writer returned pt belongings from locker, and the pt is released into her own care.   
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
## 2014-04-16 NOTE — Progress Notes (Signed)
[facility] :  Will you be returning to the same living situation after discharge: Yes,  home with friend At discharge, do you have transportation home?:Yes,  friend or bus Do you have the ability to pay for your medications:Yes,  [facility]  Release of information consent forms completed and submitted to Medical Records by CSW.  Patient to Follow up at: Follow-up Information   Follow up with [facility]. (Walk in between 8am-12pm for medication managment/assessment for services on day after discharge.)    Contact information:   [address] [place], [place] [address] Phone: [phone] Fax: [phone]      Patient denies SI/HI:   Yes,  group/self report.     Safety Planning and Suicide Prevention discussed:  Yes,  Pt refused to consent to family contact. SPE completed with pt and she was encouraged to share information with support network, ask questions, and talk about any concerns relating to SPE.  Smart, Jeslyn Amsler LCSWA  04/16/2014, 11:41 AM

## 2014-04-16 NOTE — ED Notes (Signed)
The patient said she is having pain in her right flank and she thinks she has a kidney stone.  She has had kidney stones in the past and it feels the same way.  She is not sure if she is passing it or if it is gone.  She does say she is having blood in her urine.

## 2014-04-16 NOTE — Discharge Instructions (Signed)
Return here as needed. Follow up with your doctor or the wellness center.

## 2014-04-16 NOTE — Discharge Summary (Signed)
Physician Discharge Summary Note  Patient:  [name] is an 38 y.o., female MRN:  [id number] DOB:  05-27-1976 Patient phone:  [phone] (home)  Patient address:   [address] [place] [address],  Total Time spent with patient: 20 minutes  Date of Admission:  04/07/2014 Date of Discharge: 04/16/14  Reason for Admission:  Mood stabilization   Discharge Diagnoses: Active Problems:   Recurrent major depression-severe  Psychiatric Specialty Exam: Physical Exam  Psychiatric: She has a normal mood and affect. Her speech is normal and behavior is normal. Judgment and thought content normal. Cognition and memory are normal.    Review of Systems  Constitutional: Negative.   HENT: Negative.   Eyes: Negative.   Respiratory: Negative.   Cardiovascular: Negative.   Gastrointestinal: Negative.   Genitourinary: Negative.   Musculoskeletal: Negative.   Skin: Negative.   Neurological: Negative.   Endo/Heme/Allergies: Negative.   Psychiatric/Behavioral: Negative.  Depression: Stable  Suicidal ideas: Stable  Nervous/anxious: Stable      Blood pressure 111/67, pulse 102, temperature 99.8 F (37.7 C), temperature source Oral, resp. rate 18, height 5\' 3"  (1.6 m), weight 89.812 kg (198 lb), last menstrual period 03/23/2014, SpO2 98.00%.Body mass index is 35.08 kg/(m^2).  See Physician SRA                                                  Past Psychiatric History: See H&P Diagnosis:  Hospitalizations:  Outpatient Care:  Substance Abuse Care:  Self-Mutilation:  Suicidal Attempts:  Violent Behaviors:   Musculoskeletal: Strength & Muscle Tone: within normal limits Gait & Station: normal Patient leans: N/A  DSM5: AXIS I: Major Depression recurrent severe, Panic Disorder  AXIS II: No diagnosis  AXIS III:  Past Medical History   Diagnosis  Date   .  Bronchitis      history of   .  Headache(784.0)    .  Depression    .  Anxiety    .  GERD  (gastroesophageal reflux disease)      uses otc acid reducer   .  Endometriosis    .  Renal disorder      kidney stones   .  Bipolar 1 disorder    .  UTI (lower urinary tract infection)    .  Cigarette smoker    .  Chronic bronchitis    .  Hyperlipidemia     AXIS IV: other psychosocial or environmental problems  AXIS V: 61-70 mild symptoms   Level of Care:  OP  Hospital Course: [name] is a 38 Y/O female who states that after she left from here in June, she was not able to keep her appointment for follow up as did not have transportation. Ran out of medications. States that the depression has gotten worst. She states has had constant thoughts of suicide. She also states she is experiencing a lot of anxiety building up to panic. States she had an episode of shortness of breath with the panic and thought she was going to die. States it went away after a while but that it has never been this bad. Cant identify any other precipitating factors other than being off her medications         [name] was admitted to the adult 300 unit where she was evaluated and her symptoms were  identified. Medication management was discussed and implemented. She was encouraged to participate in unit programming. Medical problems were identified and treated appropriately. She was started on medications to help stabilize her mood and treat the anxiety. The patient was started on Seroquel, Lamictal, Zoloft and Klonopin. These medications were increased during her stay to stabilize her symptoms.  Home medication was restarted as needed. Her Ambien 10 mg at hs for insomnia was continued from home. She was evaluated each day by a clinical provider to ascertain the patient's response to treatment.  Improvement was noted by the patient's report of decreasing symptoms, improved sleep and appetite, affect, medication tolerance, behavior, and participation in unit programming.  The patient was asked each day to  complete a self inventory noting mood, mental status, pain, new symptoms, anxiety and concerns.         She responded well to medication and being in a therapeutic and supportive environment. Positive and appropriate behavior was noted and the patient was motivated for recovery.  She worked closely with the treatment team and case manager to develop a discharge plan with appropriate goals. Coping skills, problem solving as well as relaxation therapies were also part of the unit programming.         By the day of discharge she was in much improved condition than upon admission.  Symptoms were reported as significantly decreased or resolved completely.  The patient denied SI/HI and voiced no AVH. She was motivated to continue taking medication with a goal of continued improvement in mental health.   [name] was discharged home with a plan to follow up as noted below.  Consults:  psychiatry  Significant Diagnostic Studies:  Chemistry panel, CBC, UDS for benzo/opiates   Discharge Vitals:   Blood pressure 111/67, pulse 102, temperature 99.8 F (37.7 C), temperature source Oral, resp. rate 18, height 5\' 3"  (1.6 m), weight 89.812 kg (198 lb), last menstrual period 03/23/2014, SpO2 98.00%. Body mass index is 35.08 kg/(m^2). Lab Results:   No results found for this or any previous visit (from the past 72 hour(s)).  Physical Findings: AIMS: Facial and Oral Movements Muscles of Facial Expression: None, normal Lips and Perioral Area: None, normal Jaw: None, normal Tongue: None, normal,Extremity Movements Upper (arms, wrists, hands, fingers): None, normal Lower (legs, knees, ankles, toes): None, normal, Trunk Movements Neck, shoulders, hips: None, normal, Overall Severity Severity of abnormal movements (highest score from questions above): None, normal Incapacitation due to abnormal movements: None, normal Patient's awareness of abnormal movements (rate only patient's report): No Awareness,  Dental Status Current problems with teeth and/or dentures?: No Does patient usually wear dentures?: No  CIWA:    COWS:     Psychiatric Specialty Exam: See Psychiatric Specialty Exam and Suicide Risk Assessment completed by Attending Physician prior to discharge.  Discharge destination:  Home  Is patient on multiple antipsychotic therapies at discharge:  No   Has Patient had three or more failed trials of antipsychotic monotherapy by history:  No  Recommended Plan for Multiple Antipsychotic Therapies: NA     Medication List    STOP taking these medications       LORazepam 1 MG tablet  Commonly known as:  ATIVAN      TAKE these medications     Indication   albuterol 108 (90 BASE) MCG/ACT inhaler  Commonly known as:  PROVENTIL HFA;VENTOLIN HFA  Inhale 2 puffs into the lungs every 4 (four) hours as needed for wheezing or shortness of breath.  Ventolin 2 puff every 12 hours and proair 2 puffs every 4 hours   Indication:  Asthma     aspirin-acetaminophen-caffeine 250-250-65 MG per tablet  Commonly known as:  EXCEDRIN MIGRAINE  Take 2 tablets by mouth daily as needed for headache.   Indication:  Headache     clonazePAM 1 MG tablet  Commonly known as:  KLONOPIN  Take 1 tablet (1 mg total) by mouth 3 (three) times daily as needed (anxiety).   Indication:  Anxiety     ibuprofen 200 MG tablet  Commonly known as:  ADVIL,MOTRIN  Take 600 mg by mouth every 6 (six) hours as needed for moderate pain.      lamoTRIgine 25 MG tablet  Commonly known as:  LAMICTAL  Take 1 tablet (25 mg total) by mouth 2 (two) times daily.   Indication:  Depression     omeprazole 40 MG capsule  Commonly known as:  PRILOSEC  Take 1 capsule (40 mg total) by mouth 2 (two) times daily. For acid reflux   Indication:  Gastroesophageal Reflux Disease     QUEtiapine 200 MG tablet  Commonly known as:  SEROQUEL  Take 1 tablet (200 mg total) by mouth at bedtime.   Indication:  Trouble Sleeping      sertraline 100 MG tablet  Commonly known as:  ZOLOFT  Take 1 tablet (100 mg total) by mouth daily.   Indication:  Major Depressive Disorder     zolpidem 10 MG tablet  Commonly known as:  AMBIEN  Take 1 tablet (10 mg total) by mouth at bedtime.            Follow-up Information   Follow up with [facility]. (Walk in between 8am-12pm for medication managment/assessment for services on day after discharge.)    Contact information:   [address] [place], [place] [address] Phone: [phone] Fax: [phone]      Follow-up recommendations:   Activity: as tolerated  Diet: regular  Follow up outpatient basis  Comments:   Take all your medications as prescribed by your mental healthcare provider.  Report any adverse effects and or reactions from your medicines to your outpatient provider promptly.  Patient is instructed and cautioned to not engage in alcohol and or illegal drug use while on prescription medicines.  In the event of worsening symptoms, patient is instructed to call the crisis hotline, 911 and or go to the nearest ED for appropriate evaluation and treatment of symptoms.  Follow-up with your primary care provider for your other medical issues, concerns and or health care needs.   Total Discharge Time:  Greater than 30 minutes.  Signed: Fransisca KaufmannDAVIS, LAURA NP-C 04/16/2014, 11:00 AM I personally assessed the patient and formulated the plan Madie RenoIrving A. Dub MikesLugo, M.D.
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
## 2014-04-17 LAB — URINE CULTURE
Colony Count: NO GROWTH
Culture: NO GROWTH

## 2014-04-17 NOTE — ED Provider Notes (Signed)
Medical screening examination/treatment/procedure(s) were conducted as a shared visit with non-physician practitioner(s) and myself.  I personally evaluated the patient during the encounter.   EKG Interpretation None      I interviewed and examined the patient. Lungs are CTAB. Cardiac exam wnl. Abdomen soft, mild right flank ttp. Pt and her partner are suspiciously drowsy on my exam. She has had 3 CT scans in the last 2 mos. US and labs non-contrib here today. Pt had soft BP here so we gave her IVF and toradol for pain. I interpreted/reviewed the labs and/or imaging which were non-contributory.  Will rec she f/u w/ her pcp.   Purvis SheffieldForrest Asti Mackley, MD 04/17/14 [phone]

## 2014-04-19 NOTE — Progress Notes (Signed)
Patient Discharge Instructions:  After Visit Summary (AVS):   Faxed to:  04/19/14 Discharge Summary Note:   Faxed to:  04/19/14 Psychiatric Admission Assessment Note:   Faxed to:  04/19/14 Suicide Risk Assessment - Discharge Assessment:   Faxed to:  04/19/14 Faxed/Sent to the Next Level Care provider:  04/19/14 [facility] to [facility] @ [phone]  Jerelene ReddenSheena E [place], 04/19/2014, 10:17 AM

## 2014-06-06 ENCOUNTER — Encounter (HOSPITAL_COMMUNITY): Payer: Self-pay | Admitting: Emergency Medicine

## 2014-06-06 DIAGNOSIS — Z87440 Personal history of urinary (tract) infections: Secondary | ICD-10-CM | POA: Diagnosis not present

## 2014-06-06 DIAGNOSIS — Z87448 Personal history of other diseases of urinary system: Secondary | ICD-10-CM | POA: Diagnosis not present

## 2014-06-06 DIAGNOSIS — Z79899 Other long term (current) drug therapy: Secondary | ICD-10-CM | POA: Insufficient documentation

## 2014-06-06 DIAGNOSIS — F315 Bipolar disorder, current episode depressed, severe, with psychotic features: Secondary | ICD-10-CM | POA: Diagnosis not present

## 2014-06-06 DIAGNOSIS — Z8639 Personal history of other endocrine, nutritional and metabolic disease: Secondary | ICD-10-CM | POA: Diagnosis not present

## 2014-06-06 DIAGNOSIS — R51 Headache: Secondary | ICD-10-CM | POA: Insufficient documentation

## 2014-06-06 DIAGNOSIS — J42 Unspecified chronic bronchitis: Secondary | ICD-10-CM | POA: Insufficient documentation

## 2014-06-06 DIAGNOSIS — R509 Fever, unspecified: Secondary | ICD-10-CM | POA: Diagnosis not present

## 2014-06-06 DIAGNOSIS — R109 Unspecified abdominal pain: Secondary | ICD-10-CM | POA: Diagnosis not present

## 2014-06-06 DIAGNOSIS — Z7982 Long term (current) use of aspirin: Secondary | ICD-10-CM | POA: Diagnosis not present

## 2014-06-06 DIAGNOSIS — K219 Gastro-esophageal reflux disease without esophagitis: Secondary | ICD-10-CM | POA: Diagnosis not present

## 2014-06-06 DIAGNOSIS — R1110 Vomiting, unspecified: Secondary | ICD-10-CM | POA: Insufficient documentation

## 2014-06-06 DIAGNOSIS — Z791 Long term (current) use of non-steroidal anti-inflammatories (NSAID): Secondary | ICD-10-CM | POA: Insufficient documentation

## 2014-06-06 DIAGNOSIS — Z720 Tobacco use: Secondary | ICD-10-CM | POA: Insufficient documentation

## 2014-06-06 DIAGNOSIS — F419 Anxiety disorder, unspecified: Secondary | ICD-10-CM | POA: Insufficient documentation

## 2014-06-06 DIAGNOSIS — M545 Low back pain: Secondary | ICD-10-CM | POA: Diagnosis present

## 2014-06-06 LAB — PREGNANCY, URINE: Preg Test, Ur: NEGATIVE

## 2014-06-06 LAB — URINALYSIS, ROUTINE W REFLEX MICROSCOPIC
Bilirubin Urine: NEGATIVE
Glucose, UA: NEGATIVE mg/dL
Ketones, ur: NEGATIVE mg/dL
NITRITE: NEGATIVE
Protein, ur: NEGATIVE mg/dL
SPECIFIC GRAVITY, URINE: 1.006 (ref 1.005–1.030)
UROBILINOGEN UA: 1 mg/dL (ref 0.0–1.0)
pH: 6 (ref 5.0–8.0)

## 2014-06-06 LAB — URINE MICROSCOPIC-ADD ON

## 2014-06-06 NOTE — ED Notes (Signed)
Pt reports sharp lower back pain and painful urination with fever that returned today. Pt took ibuprofen 3 hours ago.

## 2014-06-07 ENCOUNTER — Emergency Department (HOSPITAL_COMMUNITY): Payer: Medicaid Other

## 2014-06-07 ENCOUNTER — Emergency Department (HOSPITAL_COMMUNITY)
Admission: EM | Admit: 2014-06-07 | Discharge: 2014-06-07 | Disposition: A | Discharge status: Home / Self Care | Payer: Medicaid Other | Attending: Emergency Medicine | Admitting: Emergency Medicine

## 2014-06-07 DIAGNOSIS — R109 Unspecified abdominal pain: Secondary | ICD-10-CM

## 2014-06-07 DIAGNOSIS — R300 Dysuria: Secondary | ICD-10-CM

## 2014-06-07 LAB — CBC WITH DIFFERENTIAL/PLATELET
BASOS ABS: 0 10*3/uL (ref 0.0–0.1)
Basophils Relative: 1 % (ref 0–1)
EOS PCT: 4 % (ref 0–5)
Eosinophils Absolute: 0.3 10*3/uL (ref 0.0–0.7)
HEMATOCRIT: 38.6 % (ref 36.0–46.0)
Hemoglobin: 13.1 g/dL (ref 12.0–15.0)
Lymphocytes Relative: 39 % (ref 12–46)
Lymphs Abs: 3 10*3/uL (ref 0.7–4.0)
MCH: 28.5 pg (ref 26.0–34.0)
MCHC: 33.9 g/dL (ref 30.0–36.0)
MCV: 84.1 fL (ref 78.0–100.0)
MONO ABS: 0.7 10*3/uL (ref 0.1–1.0)
Monocytes Relative: 9 % (ref 3–12)
Neutro Abs: 3.6 10*3/uL (ref 1.7–7.7)
Neutrophils Relative %: 47 % (ref 43–77)
Platelets: 307 10*3/uL (ref 150–400)
RBC: 4.59 MIL/uL (ref 3.87–5.11)
RDW: 13.1 % (ref 11.5–15.5)
WBC: 7.6 10*3/uL (ref 4.0–10.5)

## 2014-06-07 LAB — BASIC METABOLIC PANEL
Anion gap: 16 — ABNORMAL HIGH (ref 5–15)
BUN: 6 mg/dL (ref 6–23)
CALCIUM: 9.1 mg/dL (ref 8.4–10.5)
CO2: 22 meq/L (ref 19–32)
CREATININE: 0.52 mg/dL (ref 0.50–1.10)
Chloride: 102 mEq/L (ref 96–112)
GFR calc Af Amer: >90 mL/min (ref >90)
GFR calc non Af Amer: >90 mL/min (ref >90)
GLUCOSE: 91 mg/dL (ref 70–99)
Potassium: 3.4 mEq/L — ABNORMAL LOW (ref 3.7–5.3)
Sodium: 140 mEq/L (ref 137–147)

## 2014-06-07 LAB — URINALYSIS, ROUTINE W REFLEX MICROSCOPIC
Bilirubin Urine: NEGATIVE
Glucose, UA: NEGATIVE mg/dL
Hgb urine dipstick: NEGATIVE
Ketones, ur: NEGATIVE mg/dL
LEUKOCYTES UA: NEGATIVE
NITRITE: NEGATIVE
PH: 6 (ref 5.0–8.0)
Protein, ur: NEGATIVE mg/dL
SPECIFIC GRAVITY, URINE: 1.009 (ref 1.005–1.030)
Urobilinogen, UA: 1 mg/dL (ref 0.0–1.0)

## 2014-06-07 MED ORDER — PHENAZOPYRIDINE HCL 200 MG PO TABS: 200.0000 mg | ORAL_TABLET | Freq: Three times a day (TID) | ORAL | Status: DC

## 2014-06-07 MED ORDER — ACETAMINOPHEN 500 MG PO TABS
1000.0000 mg | ORAL_TABLET | Freq: Once | ORAL | Status: AC
  Administered 2014-06-07: 1000 mg via ORAL
  Filled 2014-06-07: qty 2

## 2014-06-07 MED ORDER — FLUCONAZOLE 150 MG PO TABS: 150.0000 mg | ORAL_TABLET | Freq: Once | ORAL | Status: DC

## 2014-06-07 MED ORDER — MORPHINE SULFATE 4 MG/ML IJ SOLN
4.0000 mg | Freq: Once | INTRAMUSCULAR | Status: AC
  Administered 2014-06-07: 4 mg via INTRAVENOUS
  Filled 2014-06-07: qty 1

## 2014-06-07 MED ORDER — POTASSIUM CHLORIDE CRYS ER 20 MEQ PO TBCR
40.0000 meq | EXTENDED_RELEASE_TABLET | Freq: Once | ORAL | Status: AC
  Administered 2014-06-07: 40 meq via ORAL
  Filled 2014-06-07: qty 2

## 2014-06-07 NOTE — ED Notes (Signed)
Pt requesting more pain medication, MD informed

## 2014-06-07 NOTE — Discharge Instructions (Signed)
Dysuria Take Tylenol or Advil for pain. You have a yeast infection which could be the cause of your discomfort with urination. Take the medication prescribed. The pills were turn your urine and orange color. Call any of the numbers on the resource guide to get a new primary care physician Dysuria is the medical term for pain with urination. There are many causes for dysuria, but urinary tract infection is the most common. If a urinalysis was performed it can show that there is a urinary tract infection. A urine culture confirms that you or your child is sick. You will need to follow up with a healthcare provider because:  If a urine culture was done you will need to know the culture results and treatment recommendations.  If the urine culture was positive, you or your child will need to be put on antibiotics or know if the antibiotics prescribed are the right antibiotics for your urinary tract infection.  If the urine culture is negative (no urinary tract infection), then other causes may need to be explored or antibiotics need to be stopped. Today laboratory work may have been done and there does not seem to be an infection. If cultures were done they will take at least 24 to 48 hours to be completed. Today x-rays may have been taken and they read as normal. No cause can be found for the problems. The x-rays may be re-read by a radiologist and you will be contacted if additional findings are made. You or your child may have been put on medications to help with this problem until you can see your primary caregiver. If the problems get better, see your primary caregiver if the problems return. If you were given antibiotics (medications which kill germs), take all of the mediations as directed for the full course of treatment.  If laboratory work was done, you need to find the results. Leave a telephone number where you can be reached. If this is not possible, make sure you find out how you are to get test  results. HOME CARE INSTRUCTIONS   Drink lots of fluids. For adults, drink eight, 8 ounce glasses of clear juice or water a day. For children, replace fluids as suggested by your caregiver.  Empty the bladder often. Avoid holding urine for long periods of time.  After a bowel movement, women should cleanse front to back, using each tissue only once.  Empty your bladder before and after sexual intercourse.  Take all the medicine given to you until it is gone. You may feel better in a few days, but TAKE ALL MEDICINE.  Avoid caffeine, tea, alcohol and carbonated beverages, because they tend to irritate the bladder.  In men, alcohol may irritate the prostate.  Only take over-the-counter or prescription medicines for pain, discomfort, or fever as directed by your caregiver.  If your caregiver has given you a follow-up appointment, it is very important to keep that appointment. Not keeping the appointment could result in a chronic or permanent injury, pain, and disability. If there is any problem keeping the appointment, you must call back to this facility for assistance. SEEK IMMEDIATE MEDICAL CARE IF:   Back pain develops.  A fever develops.  There is nausea (feeling sick to your stomach) or vomiting (throwing up).  Problems are no better with medications or are getting worse. MAKE SURE YOU:   Understand these instructions.  Will watch your condition.  Will get help right away if you are not doing well or  get worse. Document Released: 04/02/2004 Document Revised: 09/27/2011 Document Reviewed: 02/08/2008 [facility] Patient Information 2015 [place], [place]. This information is not intended to replace advice given to you by your health care provider. Make sure you discuss any questions you have with your health care provider.  Emergency Department Resource Guide 1) Find a Doctor and Pay Out of Pocket Although you won't have to find out who is covered by your insurance plan, it is a  good idea to ask around and get recommendations. You will then need to call the office and see if the doctor you have chosen will accept you as a new patient and what types of options they offer for patients who are self-pay. Some doctors offer discounts or will set up payment plans for their patients who do not have insurance, but you will need to ask so you aren't surprised when you get to your appointment.  2) Contact Your Local Health Department Not all health departments have doctors that can see patients for sick visits, but many do, so it is worth a call to see if yours does. If you don't know where your local health department is, you can check in your phone book. The CDC also has a tool to help you locate your state's health department, and many state websites also have listings of all of their local health departments.  3) Find a Walk-in Clinic If your illness is not likely to be very severe or complicated, you may want to try a walk in clinic. These are popping up all over the country in pharmacies, drugstores, and shopping centers. They're usually staffed by nurse practitioners or physician assistants that have been trained to treat common illnesses and complaints. They're usually fairly quick and inexpensive. However, if you have serious medical issues or chronic medical problems, these are probably not your best option.  No Primary Care Doctor: - Call Health Connect at  [phone] - they can help you locate a primary care doctor that  accepts your insurance, provides certain services, etc. - Physician Referral Service- [phone]  Chronic Pain Problems: Organization         Address  Phone   Notes  [facility]  [phone] Patients need to be referred by their primary care doctor.   Medication Assistance: Organization         Address  Phone   Notes  [facility] Medication [facility] [address] [place] [place], [place] [address] [phone] --Must be a resident of [facility] -- Must have NO insurance coverage whatsoever (no Medicaid/ Medicare, etc.) -- The pt. MUST have a primary care doctor that directs their care regularly and follows them in the community   MedAssist  [phone]   [employer]  [phone]    Agencies that provide inexpensive medical care: Organization         Address  Phone   Notes  [facility]  [phone]   [facility]    [phone]   [facility] [address] [place], [place] [address] [phone]   [facility] [address], [place] [phone]   Planned Parenthood    [phone]   [facility]    [phone]   [facility] and [facility]  [address], [place] Phone:  [phone], Fax:  [phone] Hours of Operation:  9 am - 6 pm,  M-F.  Also accepts Medicaid/Medicare and self-pay.  [facility]  [address], [place] Phone: [phone]) [phone], Fax: [phone]. Hours of Operation:  8:30 am - 5:30 pm, M-F.  Also accepts Medicaid and self-pay.  [facility] [place] [address], [place] Phone: [phone]   [facility] [address], [place], [place] [phone], Ext. [phone] Mondays & Thursdays: 7-9 AM.  First 15 patients are seen on a first come, first serve basis.    Medicaid-accepting [facility] Providers:  Organization         Address  Phone   Notes  [facility] [address], [place] [phone]) [phone] Also accepts self-pay patients.  [facility] [address], [place]  [phone]) [phone]   [facility] [address], [place] [phone]) [phone]   [facility] [address] [place] [address], [place] [phone]) [phone]   Renaye RakersVeita Bland [address], [place]   [phone])  [phone] Only accepts [place] Access [place]icaid patients after they have their name applied to their card.   Self-Pay (no insurance) in [facility] [place]:  Organization         Address  Phone   Notes  Sickle Cell Patients, [facility] [address], [place] [phone](336) [phone]   [facility] [address], [place] [phone]   [facility]  [address], [place] [phone]) [phone]   [facility]/Dr. Osei-Bonsu  [address] [place] [address], [place] or [id number], [place] [phone] Phone number for both [place] and [place] locations is the same.  [facility] [address], [place] [phone]) [phone]   [facility] [address] [place] [address], [place] or [address] [phone](336) [phone]   [facility] [address], [place] [phone]) [phone], phone; [phone](336) [phone], fax Sees patients 1st and 3rd Saturday of every month.  Must not qualify for public or private insurance (i.e. Medicaid, Medicare, [place] Health Choice, Veterans' Benefits)  Household income should be no more than 200% of the poverty level The clinic cannot treat you if you are pregnant or think you are pregnant  Sexually transmitted diseases are not treated at the clinic.    Dental Care: Organization         Address  Phone  Notes  [facility] of [facility] [address], [place] [phone] Accepts children up to age 37 who are enrolled in [place]icaid or [place] Health Choice; pregnant women with a Medicaid card; and children who have applied for Medicaid or [place] Health Choice, but were declined, whose parents can pay a reduced fee at time of service.  [facility] of [facility] [place]  [address], [place] [phone]) [phone] Accepts children up to age 37 who are enrolled in [place]icaid or [place] Health  Choice; pregnant women with a Medicaid card; and children who have applied for Medicaid or [place] Health Choice, but were declined, whose parents can pay a reduced fee at time of service.  [facility]  [address], [place] [phone]) [phone] Patients are seen by appointment only. Walk-ins are not accepted. [facility] will see patients 37 years of age and older. Monday -  Tuesday (8am-5pm) Most Wednesdays (8:30-5pm) $30 per visit, cash only  [facility]  [address], [facility] [phone]) [phone] Patients are seen by appointment only. Walk-ins are not accepted. [facility] will see patients 37 years of age and older. One Wednesday Evening (Monthly: Volunteer Based).  $30 per visit, cash only  [employer] [facility] [facility]  [phone] for adults; Children under age 684, call Graduate Pediatric Dentistry at [phone]. Children aged 414-14, please call [phone] to request a pediatric application.  Dental services are provided in all areas of dental care including fillings, crowns and bridges, complete and partial dentures, implants, gum treatment, root canals, and extractions. Preventive care is also provided. Treatment is provided to both adults and children. Patients are selected via a lottery and there is often a waiting list.   [facility] [address], [place]  [phone]) [phone] [email or web address]   [facility] [address], [place], [place] [phone], Ext. [phone] Second and Fourth Thursday of each month, opens at 6:30 AM; Clinic ends at 9 AM.  Patients are seen on a first-come first-served basis, and a limited number are seen during each clinic.   [facility]  [address], [place], [place] [phone]) [phone]   Eligibility Requirements You must have lived in [place], [place], or [place] counties for at least the last three months.   You cannot be eligible for state or  federal sponsored National Cityhealthcare insurance, including CIGNAVeterans Administration, [place]icaid, or [employer] EntertainmentMedicare.   You generally cannot be eligible for healthcare insurance through your employer.    How to apply: Eligibility screenings are held every Tuesday and Wednesday afternoon from 1:00 pm until 4:00 pm. You do not need an appointment for the interview!  [facility] [address], [place], [place] [phone]   [facility]  [phone]   [facility]  [phone]   [facility]  [phone]    Behavioral Health Resources in the Community: Intensive Outpatient Programs Organization         Address  Phone  Notes  [facility] [address], [place], [place] [phone]   [facility] [address], [place], [place] [phone]   [facility] [address], [place], [place]  [phone]   [facility] [address],  [place], [place] [phone] or [phone]   Substance Abuse Resources Organization         Address  Phone  Notes  [facility]  [phone]   [facility]  [phone]   [facility]  [phone]   [facility]  [phone]   Residential & Outpatient Substance Abuse Program  [phone]   Psychological Services Organization         Address  Phone  Notes  [facility]  [phone]   [facility]  [phone]- [phone]   [facility] [address], [place] [phone] or [phone]    Mobile Crisis Teams Organization         Address  Phone  Notes  [facility], Mobile Crisis Care Unit  [phone]   [facility]  [address] [place], [place] [phone]   Doristine LocksSharon DeEsch [address] [place] [phone]    Self-Help/Support Groups Organization         Address  Phone  Notes  [facility] of [place] - variety of support groups  [phone] Call for more information  Narcotics Anonymous (NA), [facility] [address], [place]  2 meetings at this location   Statisticianesidential Treatment Programs Organization         Address  Phone  Notes  [facility] [address],    [place]  [phone]   [facility]  [address], [place] [phone], [place], [place] [phone]   [facility] [address], [place] [phone] Admissions: 8am-3pm M-F  [facility] [address]    [place], [place] [phone]   [facility] [address], [place], [place] [phone]   [facility] [address]  [place], [place] [phone]   [facility] - Intensive Outpatient [address], [place], [place] [phone]   [facility] ([facility] [address]  [place], [place] [phone] or [phone]   [facility] ([facility]) [address] [place], [place] [phone] Accepts Medicaid  [facility] [address]  [place] [phone] Substance Abuse/Addiction Treatment   [facility] Behavioral Health Resources Organization         Address  Phone  Notes  [facility]  [phone]) [phone]   Angie FavaJulie Brannon, PhD [address], [address] [place], [place]   [phone](336) [phone] or [phone]   [facility]   [address] [place], [place] [phone](336) [phone]   [facility] [address], [place], [place] [phone]) [phone] Insurance/Medicaid/sponsorship through [facility]  [facility] Families [address]                                    [place], [place] [phone]) [phone] Therapy/tele-psych/case  [facility] [address].   [place], [place] [phone]) [phone]    Dr. Lolly MustacheArfeen  [phone]   [facility] [place]  [employer] [facility] [place] [facility]  Dept. 1) [address], [place] 2) [address], [place] 3)  [address], [place] [phone](336) [phone](336) [phone]  [phone](336) [phone]   [facility] Child Abuse Hotline [phone]) [phone] or [phone](336) [phone] (After Hours)

## 2014-06-07 NOTE — ED Notes (Signed)
Dr. Jacubowitz at bedside 

## 2014-06-07 NOTE — ED Notes (Signed)
CT called and informed pt is ready to be scanned.
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
## 2014-06-07 NOTE — ED Notes (Signed)
MD at bedside. 

## 2014-06-08 LAB — URINE CULTURE
COLONY COUNT: NO GROWTH
CULTURE: NO GROWTH

## 2014-06-15 ENCOUNTER — Encounter (HOSPITAL_COMMUNITY): Payer: Self-pay | Admitting: *Deleted

## 2014-06-15 ENCOUNTER — Emergency Department (HOSPITAL_COMMUNITY)
Admission: EM | Admit: 2014-06-15 | Discharge: 2014-06-15 | Disposition: A | Discharge status: Home / Self Care | Payer: Medicaid Other | Attending: Emergency Medicine | Admitting: Emergency Medicine

## 2014-06-15 DIAGNOSIS — Z8742 Personal history of other diseases of the female genital tract: Secondary | ICD-10-CM | POA: Insufficient documentation

## 2014-06-15 DIAGNOSIS — F319 Bipolar disorder, unspecified: Secondary | ICD-10-CM | POA: Diagnosis not present

## 2014-06-15 DIAGNOSIS — Z720 Tobacco use: Secondary | ICD-10-CM | POA: Diagnosis not present

## 2014-06-15 DIAGNOSIS — F419 Anxiety disorder, unspecified: Secondary | ICD-10-CM | POA: Diagnosis not present

## 2014-06-15 DIAGNOSIS — N390 Urinary tract infection, site not specified: Secondary | ICD-10-CM | POA: Diagnosis present

## 2014-06-15 DIAGNOSIS — Z8709 Personal history of other diseases of the respiratory system: Secondary | ICD-10-CM | POA: Insufficient documentation

## 2014-06-15 DIAGNOSIS — Z79899 Other long term (current) drug therapy: Secondary | ICD-10-CM | POA: Diagnosis not present

## 2014-06-15 DIAGNOSIS — Z8639 Personal history of other endocrine, nutritional and metabolic disease: Secondary | ICD-10-CM | POA: Insufficient documentation

## 2014-06-15 DIAGNOSIS — K219 Gastro-esophageal reflux disease without esophagitis: Secondary | ICD-10-CM | POA: Insufficient documentation

## 2014-06-15 DIAGNOSIS — R110 Nausea: Secondary | ICD-10-CM

## 2014-06-15 DIAGNOSIS — Z3202 Encounter for pregnancy test, result negative: Secondary | ICD-10-CM | POA: Insufficient documentation

## 2014-06-15 DIAGNOSIS — Z87442 Personal history of urinary calculi: Secondary | ICD-10-CM | POA: Diagnosis not present

## 2014-06-15 LAB — URINALYSIS, ROUTINE W REFLEX MICROSCOPIC
BILIRUBIN URINE: NEGATIVE
Glucose, UA: NEGATIVE mg/dL
HGB URINE DIPSTICK: NEGATIVE
Ketones, ur: NEGATIVE mg/dL
Nitrite: NEGATIVE
PH: 7 (ref 5.0–8.0)
Protein, ur: NEGATIVE mg/dL
Specific Gravity, Urine: 1.005 (ref 1.005–1.030)
UROBILINOGEN UA: 1 mg/dL (ref 0.0–1.0)

## 2014-06-15 LAB — URINE MICROSCOPIC-ADD ON

## 2014-06-15 LAB — POC URINE PREG, ED: Preg Test, Ur: NEGATIVE

## 2014-06-15 MED ORDER — OXYCODONE-ACETAMINOPHEN 5-325 MG PO TABS
1.0000 | ORAL_TABLET | Freq: Once | ORAL | Status: AC
  Administered 2014-06-15: 1 via ORAL
  Filled 2014-06-15: qty 1

## 2014-06-15 MED ORDER — CEPHALEXIN 500 MG PO CAPS
500.0000 mg | ORAL_CAPSULE | Freq: Once | ORAL | Status: AC
  Administered 2014-06-15: 500 mg via ORAL
  Filled 2014-06-15: qty 1

## 2014-06-15 MED ORDER — ONDANSETRON 8 MG PO TBDP: 8.0000 mg | ORAL_TABLET | Freq: Three times a day (TID) | ORAL | Status: DC | PRN

## 2014-06-15 MED ORDER — CEPHALEXIN 500 MG PO CAPS: 500.0000 mg | ORAL_CAPSULE | Freq: Three times a day (TID) | ORAL | Status: DC
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
## 2014-06-15 NOTE — Discharge Instructions (Signed)

## 2014-06-15 NOTE — ED Notes (Signed)
Pt reports burning with urination, lower abdominal and lower back pain and foul smelling urine x 1 week, reports hx of UTI, sts this seems like it. Also reports fever.

## 2014-06-25 ENCOUNTER — Emergency Department (HOSPITAL_COMMUNITY)
Admission: EM | Admit: 2014-06-25 | Discharge: 2014-06-25 | Disposition: A | Discharge status: Home / Self Care | Payer: Medicaid Other | Attending: Emergency Medicine | Admitting: Emergency Medicine

## 2014-06-25 ENCOUNTER — Encounter (HOSPITAL_COMMUNITY): Payer: Self-pay | Admitting: Emergency Medicine

## 2014-06-25 DIAGNOSIS — Z79899 Other long term (current) drug therapy: Secondary | ICD-10-CM | POA: Insufficient documentation

## 2014-06-25 DIAGNOSIS — N390 Urinary tract infection, site not specified: Secondary | ICD-10-CM

## 2014-06-25 DIAGNOSIS — Z720 Tobacco use: Secondary | ICD-10-CM | POA: Insufficient documentation

## 2014-06-25 DIAGNOSIS — Z87442 Personal history of urinary calculi: Secondary | ICD-10-CM | POA: Diagnosis not present

## 2014-06-25 DIAGNOSIS — Z792 Long term (current) use of antibiotics: Secondary | ICD-10-CM | POA: Diagnosis not present

## 2014-06-25 DIAGNOSIS — Z8709 Personal history of other diseases of the respiratory system: Secondary | ICD-10-CM | POA: Diagnosis not present

## 2014-06-25 DIAGNOSIS — R300 Dysuria: Secondary | ICD-10-CM | POA: Diagnosis present

## 2014-06-25 DIAGNOSIS — Z8639 Personal history of other endocrine, nutritional and metabolic disease: Secondary | ICD-10-CM | POA: Diagnosis not present

## 2014-06-25 DIAGNOSIS — K219 Gastro-esophageal reflux disease without esophagitis: Secondary | ICD-10-CM | POA: Diagnosis not present

## 2014-06-25 DIAGNOSIS — Z8542 Personal history of malignant neoplasm of other parts of uterus: Secondary | ICD-10-CM | POA: Diagnosis not present

## 2014-06-25 DIAGNOSIS — F419 Anxiety disorder, unspecified: Secondary | ICD-10-CM | POA: Diagnosis not present

## 2014-06-25 DIAGNOSIS — Z7982 Long term (current) use of aspirin: Secondary | ICD-10-CM | POA: Diagnosis not present

## 2014-06-25 DIAGNOSIS — F319 Bipolar disorder, unspecified: Secondary | ICD-10-CM | POA: Diagnosis not present

## 2014-06-25 LAB — URINE MICROSCOPIC-ADD ON

## 2014-06-25 LAB — URINALYSIS, ROUTINE W REFLEX MICROSCOPIC
Glucose, UA: NEGATIVE mg/dL
Ketones, ur: 15 mg/dL — AB
Nitrite: NEGATIVE
PH: 5.5 (ref 5.0–8.0)
Protein, ur: NEGATIVE mg/dL
SPECIFIC GRAVITY, URINE: 1.021 (ref 1.005–1.030)
UROBILINOGEN UA: 0.2 mg/dL (ref 0.0–1.0)

## 2014-06-25 MED ORDER — CIPROFLOXACIN HCL 500 MG PO TABS: 500.0000 mg | ORAL_TABLET | Freq: Two times a day (BID) | ORAL | Status: DC

## 2014-06-25 MED ORDER — PHENAZOPYRIDINE HCL 95 MG PO TABS: 95.0000 mg | ORAL_TABLET | Freq: Three times a day (TID) | ORAL | Status: DC | PRN

## 2014-06-25 MED ORDER — HYDROCODONE-ACETAMINOPHEN 5-325 MG PO TABS
2.0000 | ORAL_TABLET | Freq: Once | ORAL | Status: AC
  Administered 2014-06-25: 2 via ORAL
  Filled 2014-06-25: qty 52
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
## 2014-06-25 NOTE — ED Notes (Signed)
Pt c/o UTI sx  And seen for same recently but not improving; pt sts dysuria and pain in lower back

## 2014-06-25 NOTE — Discharge Instructions (Signed)
Take antibiotic to completion. Stay well hydrated. Take pyridium for burning when you urinate.  Urinary Tract Infection Urinary tract infections (UTIs) can develop anywhere along your urinary tract. Your urinary tract is your body's drainage system for removing wastes and extra water. Your urinary tract includes two kidneys, two ureters, a bladder, and a urethra. Your kidneys are a pair of bean-shaped organs. Each kidney is about the size of your fist. They are located below your ribs, one on each side of your spine. CAUSES Infections are caused by microbes, which are microscopic organisms, including fungi, viruses, and bacteria. These organisms are so small that they can only be seen through a microscope. Bacteria are the microbes that most commonly cause UTIs. SYMPTOMS  Symptoms of UTIs may vary by age and gender of the patient and by the location of the infection. Symptoms in young women typically include a frequent and intense urge to urinate and a painful, burning feeling in the bladder or urethra during urination. Older women and men are more likely to be tired, shaky, and weak and have muscle aches and abdominal pain. A fever may mean the infection is in your kidneys. Other symptoms of a kidney infection include pain in your back or sides below the ribs, nausea, and vomiting. DIAGNOSIS To diagnose a UTI, your caregiver will ask you about your symptoms. Your caregiver also will ask to provide a urine sample. The urine sample will be tested for bacteria and white blood cells. White blood cells are made by your body to help fight infection. TREATMENT  Typically, UTIs can be treated with medication. Because most UTIs are caused by a bacterial infection, they usually can be treated with the use of antibiotics. The choice of antibiotic and length of treatment depend on your symptoms and the type of bacteria causing your infection. HOME CARE INSTRUCTIONS  If you were prescribed antibiotics, take them  exactly as your caregiver instructs you. Finish the medication even if you feel better after you have only taken some of the medication.  Drink enough water and fluids to keep your urine clear or pale yellow.  Avoid caffeine, tea, and carbonated beverages. They tend to irritate your bladder.  Empty your bladder often. Avoid holding urine for long periods of time.  Empty your bladder before and after sexual intercourse.  After a bowel movement, women should cleanse from front to back. Use each tissue only once. SEEK MEDICAL CARE IF:   You have back pain.  You develop a fever.  Your symptoms do not begin to resolve within 3 days. SEEK IMMEDIATE MEDICAL CARE IF:   You have severe back pain or lower abdominal pain.  You develop chills.  You have nausea or vomiting.  You have continued burning or discomfort with urination. MAKE SURE YOU:   Understand these instructions.  Will watch your condition.  Will get help right away if you are not doing well or get worse. Document Released: 04/14/2005 Document Revised: 01/04/2012 Document Reviewed: 08/13/2011 [facility] Patient Information 2015 [place], [place]. This information is not intended to replace advice given to you by your health care provider. Make sure you discuss any questions you have with your health care provider.

## 2014-06-26 LAB — URINE CULTURE

## 2014-07-26 ENCOUNTER — Emergency Department (HOSPITAL_COMMUNITY): Payer: Medicaid Other

## 2014-07-26 ENCOUNTER — Emergency Department (HOSPITAL_COMMUNITY)
Admission: EM | Admit: 2014-07-26 | Discharge: 2014-07-26 | Disposition: A | Discharge status: Home / Self Care | Payer: Medicaid Other | Attending: Emergency Medicine | Admitting: Emergency Medicine

## 2014-07-26 ENCOUNTER — Encounter (HOSPITAL_COMMUNITY): Payer: Self-pay | Admitting: *Deleted

## 2014-07-26 DIAGNOSIS — Z87442 Personal history of urinary calculi: Secondary | ICD-10-CM | POA: Insufficient documentation

## 2014-07-26 DIAGNOSIS — K219 Gastro-esophageal reflux disease without esophagitis: Secondary | ICD-10-CM | POA: Diagnosis not present

## 2014-07-26 DIAGNOSIS — Z792 Long term (current) use of antibiotics: Secondary | ICD-10-CM | POA: Diagnosis not present

## 2014-07-26 DIAGNOSIS — R197 Diarrhea, unspecified: Secondary | ICD-10-CM

## 2014-07-26 DIAGNOSIS — Z79899 Other long term (current) drug therapy: Secondary | ICD-10-CM | POA: Diagnosis not present

## 2014-07-26 DIAGNOSIS — Z8639 Personal history of other endocrine, nutritional and metabolic disease: Secondary | ICD-10-CM | POA: Diagnosis not present

## 2014-07-26 DIAGNOSIS — F419 Anxiety disorder, unspecified: Secondary | ICD-10-CM | POA: Insufficient documentation

## 2014-07-26 DIAGNOSIS — Z87440 Personal history of urinary (tract) infections: Secondary | ICD-10-CM | POA: Diagnosis not present

## 2014-07-26 DIAGNOSIS — F329 Major depressive disorder, single episode, unspecified: Secondary | ICD-10-CM | POA: Diagnosis not present

## 2014-07-26 DIAGNOSIS — J42 Unspecified chronic bronchitis: Secondary | ICD-10-CM | POA: Insufficient documentation

## 2014-07-26 DIAGNOSIS — R1031 Right lower quadrant pain: Secondary | ICD-10-CM | POA: Diagnosis not present

## 2014-07-26 DIAGNOSIS — Z3202 Encounter for pregnancy test, result negative: Secondary | ICD-10-CM | POA: Diagnosis not present

## 2014-07-26 DIAGNOSIS — R109 Unspecified abdominal pain: Secondary | ICD-10-CM

## 2014-07-26 DIAGNOSIS — Z720 Tobacco use: Secondary | ICD-10-CM | POA: Insufficient documentation

## 2014-07-26 LAB — COMPREHENSIVE METABOLIC PANEL
ALT: 83 U/L — ABNORMAL HIGH (ref 0–35)
AST: 63 U/L — ABNORMAL HIGH (ref 0–37)
Albumin: 3.7 g/dL (ref 3.5–5.2)
Alkaline Phosphatase: 72 U/L (ref 39–117)
Anion gap: 9 (ref 5–15)
BILIRUBIN TOTAL: 0.3 mg/dL (ref 0.3–1.2)
BUN: <5 mg/dL — ABNORMAL LOW (ref 6–23)
CALCIUM: 8.8 mg/dL (ref 8.4–10.5)
CO2: 25 mmol/L (ref 19–32)
Chloride: 104 mEq/L (ref 96–112)
Creatinine, Ser: 0.6 mg/dL (ref 0.50–1.10)
GFR calc Af Amer: >90 mL/min (ref >90)
Glucose, Bld: 81 mg/dL (ref 70–99)
Potassium: 3.4 mmol/L — ABNORMAL LOW (ref 3.5–5.1)
Sodium: 138 mmol/L (ref 135–145)
Total Protein: 7.3 g/dL (ref 6.0–8.3)

## 2014-07-26 LAB — CBC WITH DIFFERENTIAL/PLATELET
BASOS ABS: 0 10*3/uL (ref 0.0–0.1)
Basophils Relative: 0 % (ref 0–1)
EOS PCT: 3 % (ref 0–5)
Eosinophils Absolute: 0.2 10*3/uL (ref 0.0–0.7)
HCT: 39.7 % (ref 36.0–46.0)
HEMOGLOBIN: 13.5 g/dL (ref 12.0–15.0)
Lymphocytes Relative: 38 % (ref 12–46)
Lymphs Abs: 2.5 10*3/uL (ref 0.7–4.0)
MCH: 28.7 pg (ref 26.0–34.0)
MCHC: 34 g/dL (ref 30.0–36.0)
MCV: 84.5 fL (ref 78.0–100.0)
MONOS PCT: 6 % (ref 3–12)
Monocytes Absolute: 0.4 10*3/uL (ref 0.1–1.0)
NEUTROS ABS: 3.4 10*3/uL (ref 1.7–7.7)
Neutrophils Relative %: 53 % (ref 43–77)
Platelets: 329 10*3/uL (ref 150–400)
RBC: 4.7 MIL/uL (ref 3.87–5.11)
RDW: 13.4 % (ref 11.5–15.5)
WBC: 6.6 10*3/uL (ref 4.0–10.5)

## 2014-07-26 LAB — URINALYSIS, ROUTINE W REFLEX MICROSCOPIC
Bilirubin Urine: NEGATIVE
Glucose, UA: NEGATIVE mg/dL
Hgb urine dipstick: NEGATIVE
Ketones, ur: NEGATIVE mg/dL
LEUKOCYTES UA: NEGATIVE
Nitrite: NEGATIVE
Protein, ur: NEGATIVE mg/dL
SPECIFIC GRAVITY, URINE: 1.005 (ref 1.005–1.030)
Urobilinogen, UA: 1 mg/dL (ref 0.0–1.0)
pH: 6 (ref 5.0–8.0)

## 2014-07-26 LAB — PREGNANCY, URINE: Preg Test, Ur: NEGATIVE

## 2014-07-26 MED ORDER — IOHEXOL 300 MG/ML  SOLN
25.0000 mL | Freq: Once | INTRAMUSCULAR | Status: AC | PRN
  Administered 2014-07-26: 25 mL via ORAL

## 2014-07-26 MED ORDER — MORPHINE SULFATE 4 MG/ML IJ SOLN
4.0000 mg | INTRAMUSCULAR | Status: DC | PRN
  Administered 2014-07-26 (×2): 4 mg via INTRAVENOUS
  Filled 2014-07-26 (×2): qty 1

## 2014-07-26 MED ORDER — SODIUM CHLORIDE 0.9 % IV SOLN: Freq: Once | INTRAVENOUS | Status: DC

## 2014-07-26 MED ORDER — ONDANSETRON 4 MG PO TBDP: 4.0000 mg | ORAL_TABLET | Freq: Three times a day (TID) | ORAL | Status: DC | PRN

## 2014-07-26 MED ORDER — DIPHENOXYLATE-ATROPINE 2.5-0.025 MG PO TABS: 1.0000 | ORAL_TABLET | Freq: Four times a day (QID) | ORAL | Status: DC | PRN

## 2014-07-26 MED ORDER — DICYCLOMINE HCL 20 MG PO TABS: 20.0000 mg | ORAL_TABLET | Freq: Two times a day (BID) | ORAL | Status: DC

## 2014-07-26 MED ORDER — IOHEXOL 300 MG/ML  SOLN
100.0000 mL | Freq: Once | INTRAMUSCULAR | Status: AC | PRN
  Administered 2014-07-26: 100 mL via INTRAVENOUS

## 2014-07-26 MED ORDER — ONDANSETRON HCL 4 MG/2ML IJ SOLN
4.0000 mg | Freq: Once | INTRAMUSCULAR | Status: AC
  Administered 2014-07-26: 4 mg via INTRAVENOUS
  Filled 2014-07-26: qty 2

## 2014-07-26 MED ORDER — GLYCOPYRROLATE 0.2 MG/ML IJ SOLN
0.1000 mg | Freq: Once | INTRAMUSCULAR | Status: AC
  Administered 2014-07-26: 0.1 mg via INTRAVENOUS
  Filled 2014-07-26: qty 1

## 2014-07-26 MED ORDER — SODIUM CHLORIDE 0.9 % IV BOLUS (SEPSIS)
1000.0000 mL | Freq: Once | INTRAVENOUS | Status: AC
  Administered 2014-07-26: 1000 mL via INTRAVENOUS

## 2014-07-26 NOTE — Discharge Instructions (Signed)
Abdominal Pain Many things can cause belly (abdominal) pain. Most times, the belly pain is not dangerous. Many cases of belly pain can be watched and treated at home. HOME CARE   Do not take medicines that help you go poop (laxatives) unless told to by your doctor.  Only take medicine as told by your doctor.  Eat or drink as told by your doctor. Your doctor will tell you if you should be on a special diet. GET HELP IF:  You do not know what is causing your belly pain.  You have belly pain while you are sick to your stomach (nauseous) or have runny poop (diarrhea).  You have pain while you pee or poop.  Your belly pain wakes you up at night.  You have belly pain that gets worse or better when you eat.  You have belly pain that gets worse when you eat fatty foods.  You have a fever. GET HELP RIGHT AWAY IF:   The pain does not go away within 2 hours.  You keep throwing up (vomiting).  The pain changes and is only in the right or left part of the belly.  You have bloody or tarry looking poop. MAKE SURE YOU:   Understand these instructions.  Will watch your condition.  Will get help right away if you are not doing well or get worse. Document Released: 12/22/2007 Document Revised: 07/10/2013 Document Reviewed: 03/14/2013 [facility] Patient Information 2015 [place], [place]. This information is not intended to replace advice given to you by your health care provider. Make sure you discuss any questions you have with your health care provider.  Diarrhea Diarrhea is frequent loose and watery bowel movements. It can cause you to feel weak and dehydrated. Dehydration can cause you to become tired and thirsty, have a dry mouth, and have decreased urination that often is dark yellow. Diarrhea is a sign of another problem, most often an infection that will not last long. In most cases, diarrhea typically lasts 2-3 days. However, it can last longer if it is a sign of something more serious.  It is important to treat your diarrhea as directed by your caregiver to lessen or prevent future episodes of diarrhea. CAUSES  Some common causes include:  Gastrointestinal infections caused by viruses, bacteria, or parasites.  Food poisoning or food allergies.  Certain medicines, such as antibiotics, chemotherapy, and laxatives.  Artificial sweeteners and fructose.  Digestive disorders. HOME CARE INSTRUCTIONS  Ensure adequate fluid intake (hydration): Have 1 cup (8 oz) of fluid for each diarrhea episode. Avoid fluids that contain simple sugars or sports drinks, fruit juices, whole milk products, and sodas. Your urine should be clear or pale yellow if you are drinking enough fluids. Hydrate with an oral rehydration solution that you can purchase at pharmacies, retail stores, and online. You can prepare an oral rehydration solution at home by mixing the following ingredients together:   - tsp table salt.   tsp baking soda.   tsp salt substitute containing potassium chloride.  1  tablespoons sugar.  1 L (34 oz) of water.  Certain foods and beverages may increase the speed at which food moves through the gastrointestinal (GI) tract. These foods and beverages should be avoided and include:  Caffeinated and alcoholic beverages.  High-fiber foods, such as raw fruits and vegetables, nuts, seeds, and whole grain breads and cereals.  Foods and beverages sweetened with sugar alcohols, such as xylitol, sorbitol, and mannitol.  Some foods may be well tolerated and  may help thicken stool including:  Starchy foods, such as rice, toast, pasta, low-sugar cereal, oatmeal, grits, baked potatoes, crackers, and bagels.  Bananas.  Applesauce.  Add probiotic-rich foods to help increase healthy bacteria in the GI tract, such as yogurt and fermented milk products.  Wash your hands well after each diarrhea episode.  Only take over-the-counter or prescription medicines as directed by your  caregiver.  Take a warm bath to relieve any burning or pain from frequent diarrhea episodes. SEEK IMMEDIATE MEDICAL CARE IF:   You are unable to keep fluids down.  You have persistent vomiting.  You have blood in your stool, or your stools are black and tarry.  You do not urinate in 6-8 hours, or there is only a small amount of very dark urine.  You have abdominal pain that increases or localizes.  You have weakness, dizziness, confusion, or light-headedness.  You have a severe headache.  Your diarrhea gets worse or does not get better.  You have a fever or persistent symptoms for more than 2-3 days.  You have a fever and your symptoms suddenly get worse. MAKE SURE YOU:   Understand these instructions.  Will watch your condition.  Will get help right away if you are not doing well or get worse. Document Released: 06/25/2002 Document Revised: 11/19/2013 Document Reviewed: 03/12/2012 [facility] Patient Information 2015 [place], [place]. This information is not intended to replace advice given to you by your health care provider. Make sure you discuss any questions you have with your health care provider.

## 2014-07-26 NOTE — ED Notes (Signed)
Pt reports RLQ pain that started yesterday.Pt reports nausea and Diarrhea.
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
## 2014-07-26 NOTE — ED Notes (Signed)
Unable to get the poc urne preg out of system urine preg test ordered also  Under managed orders unable to remove that study unless all orders are removed

## 2014-08-12 ENCOUNTER — Emergency Department (HOSPITAL_COMMUNITY)
Admission: EM | Admit: 2014-08-12 | Discharge: 2014-08-12 | Discharge status: Against Medical Advice | Payer: Medicaid Other | Attending: Emergency Medicine | Admitting: Emergency Medicine

## 2014-08-12 DIAGNOSIS — Z720 Tobacco use: Secondary | ICD-10-CM | POA: Diagnosis not present

## 2014-08-12 DIAGNOSIS — Z5321 Procedure and treatment not carried out due to patient leaving prior to being seen by health care provider: Secondary | ICD-10-CM | POA: Insufficient documentation

## 2014-08-12 NOTE — ED Notes (Signed)
Called pt x2 pt not in room and not in waiting area. Reported to PA will remove.

## 2014-08-12 NOTE — ED Notes (Signed)
Went into room pt was not in room to complete triage. Called in waiting room no anwser.

## 2014-08-15 ENCOUNTER — Emergency Department (HOSPITAL_COMMUNITY): Payer: Medicaid Other

## 2014-08-15 ENCOUNTER — Emergency Department (HOSPITAL_COMMUNITY)
Admission: EM | Admit: 2014-08-15 | Discharge: 2014-08-15 | Disposition: A | Discharge status: Home / Self Care | Payer: Medicaid Other | Attending: Emergency Medicine | Admitting: Emergency Medicine

## 2014-08-15 ENCOUNTER — Encounter (HOSPITAL_COMMUNITY): Payer: Self-pay

## 2014-08-15 DIAGNOSIS — Z720 Tobacco use: Secondary | ICD-10-CM | POA: Diagnosis not present

## 2014-08-15 DIAGNOSIS — Y9389 Activity, other specified: Secondary | ICD-10-CM | POA: Diagnosis not present

## 2014-08-15 DIAGNOSIS — S99911A Unspecified injury of right ankle, initial encounter: Secondary | ICD-10-CM | POA: Diagnosis present

## 2014-08-15 DIAGNOSIS — W000XXA Fall on same level due to ice and snow, initial encounter: Secondary | ICD-10-CM | POA: Insufficient documentation

## 2014-08-15 DIAGNOSIS — F419 Anxiety disorder, unspecified: Secondary | ICD-10-CM | POA: Diagnosis not present

## 2014-08-15 DIAGNOSIS — Z8709 Personal history of other diseases of the respiratory system: Secondary | ICD-10-CM | POA: Insufficient documentation

## 2014-08-15 DIAGNOSIS — Z87442 Personal history of urinary calculi: Secondary | ICD-10-CM | POA: Insufficient documentation

## 2014-08-15 DIAGNOSIS — Y998 Other external cause status: Secondary | ICD-10-CM | POA: Insufficient documentation

## 2014-08-15 DIAGNOSIS — Z87440 Personal history of urinary (tract) infections: Secondary | ICD-10-CM | POA: Insufficient documentation

## 2014-08-15 DIAGNOSIS — S0083XA Contusion of other part of head, initial encounter: Secondary | ICD-10-CM | POA: Insufficient documentation

## 2014-08-15 DIAGNOSIS — F3130 Bipolar disorder, current episode depressed, mild or moderate severity, unspecified: Secondary | ICD-10-CM | POA: Insufficient documentation

## 2014-08-15 DIAGNOSIS — Z79899 Other long term (current) drug therapy: Secondary | ICD-10-CM | POA: Diagnosis not present

## 2014-08-15 DIAGNOSIS — W19XXXA Unspecified fall, initial encounter: Secondary | ICD-10-CM

## 2014-08-15 DIAGNOSIS — Z8639 Personal history of other endocrine, nutritional and metabolic disease: Secondary | ICD-10-CM | POA: Insufficient documentation

## 2014-08-15 DIAGNOSIS — S300XXA Contusion of lower back and pelvis, initial encounter: Secondary | ICD-10-CM | POA: Diagnosis not present

## 2014-08-15 DIAGNOSIS — Z8742 Personal history of other diseases of the female genital tract: Secondary | ICD-10-CM | POA: Diagnosis not present

## 2014-08-15 DIAGNOSIS — S93401A Sprain of unspecified ligament of right ankle, initial encounter: Secondary | ICD-10-CM | POA: Insufficient documentation

## 2014-08-15 DIAGNOSIS — K219 Gastro-esophageal reflux disease without esophagitis: Secondary | ICD-10-CM | POA: Diagnosis not present

## 2014-08-15 DIAGNOSIS — H538 Other visual disturbances: Secondary | ICD-10-CM | POA: Insufficient documentation

## 2014-08-15 DIAGNOSIS — Y9289 Other specified places as the place of occurrence of the external cause: Secondary | ICD-10-CM | POA: Insufficient documentation

## 2014-08-15 MED ORDER — CYCLOBENZAPRINE HCL 10 MG PO TABS: 10.0000 mg | ORAL_TABLET | Freq: Three times a day (TID) | ORAL | Status: DC | PRN

## 2014-08-15 MED ORDER — IBUPROFEN 800 MG PO TABS: 800.0000 mg | ORAL_TABLET | Freq: Three times a day (TID) | ORAL | Status: DC

## 2014-08-15 MED ORDER — OXYCODONE-ACETAMINOPHEN 5-325 MG PO TABS
1.0000 | ORAL_TABLET | Freq: Once | ORAL | Status: AC
  Administered 2014-08-15: 1 via ORAL
  Filled 2014-08-15: qty 1

## 2014-08-15 MED ORDER — HYDROCODONE-ACETAMINOPHEN 5-325 MG PO TABS: 2.0000 | ORAL_TABLET | ORAL | Status: DC | PRN

## 2014-08-15 NOTE — ED Notes (Signed)
Patient transported to X-ray 

## 2014-08-15 NOTE — Discharge Instructions (Signed)
Ankle Sprain  An ankle sprain is an injury to the strong, fibrous tissues (ligaments) that hold the bones of your ankle joint together.   CAUSES  An ankle sprain is usually caused by a fall or by twisting your ankle. Ankle sprains most commonly occur when you step on the outer edge of your foot, and your ankle turns inward. People who participate in sports are more prone to these types of injuries.   SYMPTOMS    Pain in your ankle. The pain may be present at rest or only when you are trying to stand or walk.   Swelling.   Bruising. Bruising may develop immediately or within 1 to 2 days after your injury.   Difficulty standing or walking, particularly when turning corners or changing directions.  DIAGNOSIS   Your caregiver will ask you details about your injury and perform a physical exam of your ankle to determine if you have an ankle sprain. During the physical exam, your caregiver will press on and apply pressure to specific areas of your foot and ankle. Your caregiver will try to move your ankle in certain ways. An X-ray exam may be done to be sure a bone was not broken or a ligament did not separate from one of the bones in your ankle (avulsion fracture).   TREATMENT   Certain types of braces can help stabilize your ankle. Your caregiver can make a recommendation for this. Your caregiver may recommend the use of medicine for pain. If your sprain is severe, your caregiver may refer you to a surgeon who helps to restore function to parts of your skeletal system (orthopedist) or a physical therapist.  HOME CARE INSTRUCTIONS    Apply ice to your injury for 1-2 days or as directed by your caregiver. Applying ice helps to reduce inflammation and pain.   Put ice in a plastic bag.   Place a towel between your skin and the bag.   Leave the ice on for 15-20 minutes at a time, every 2 hours while you are awake.   Only take over-the-counter or prescription medicines for pain, discomfort, or fever as directed by  your caregiver.   Elevate your injured ankle above the level of your heart as much as possible for 2-3 days.   If your caregiver recommends crutches, use them as instructed. Gradually put weight on the affected ankle. Continue to use crutches or a cane until you can walk without feeling pain in your ankle.   If you have a plaster splint, wear the splint as directed by your caregiver. Do not rest it on anything harder than a pillow for the first 24 hours. Do not put weight on it. Do not get it wet. You may take it off to take a shower or bath.   You may have been given an elastic bandage to wear around your ankle to provide support. If the elastic bandage is too tight (you have numbness or tingling in your foot or your foot becomes cold and blue), adjust the bandage to make it comfortable.   If you have an air splint, you may blow more air into it or let air out to make it more comfortable. You may take your splint off at night and before taking a shower or bath. Wiggle your toes in the splint several times per day to decrease swelling.  SEEK MEDICAL CARE IF:    You have rapidly increasing bruising or swelling.   Your toes feel extremely cold or   you lose feeling in your foot.   Your pain is not relieved with medicine.  SEEK IMMEDIATE MEDICAL CARE IF:   Your toes are numb or blue.   You have severe pain that is increasing.  MAKE SURE YOU:    Understand these instructions.   Will watch your condition.   Will get help right away if you are not doing well or get worse.  Document Released: 07/05/2005 Document Revised: 03/29/2012 Document Reviewed: 07/17/2011  Exit[employer] Patient Information 2015 [employer]. This information is not intended to replace advice given to you by your health care provider. Make sure you discuss any questions you have with your health care provider.  Contusion  A contusion is a deep bruise. Contusions are the result of an injury that caused bleeding under the skin. The contusion  may turn blue, purple, or yellow. Minor injuries will give you a painless contusion, but more severe contusions may stay painful and swollen for a few weeks.   CAUSES   A contusion is usually caused by a blow, trauma, or direct force to an area of the body.  SYMPTOMS    Swelling and redness of the injured area.   Bruising of the injured area.   Tenderness and soreness of the injured area.   Pain.  DIAGNOSIS   The diagnosis can be made by taking a history and physical exam. An X-ray, CT scan, or MRI may be needed to determine if there were any associated injuries, such as fractures.  TREATMENT   Specific treatment will depend on what area of the body was injured. In general, the best treatment for a contusion is resting, icing, elevating, and applying cold compresses to the injured area. Over-the-counter medicines may also be recommended for pain control. Ask your caregiver what the best treatment is for your contusion.  HOME CARE INSTRUCTIONS    Put ice on the injured area.   Put ice in a plastic bag.   Place a towel between your skin and the bag.   Leave the ice on for 15-20 minutes, 3-4 times a day, or as directed by your health care provider.   Only take over-the-counter or prescription medicines for pain, discomfort, or fever as directed by your caregiver. Your caregiver may recommend avoiding anti-inflammatory medicines (aspirin, ibuprofen, and naproxen) for 48 hours because these medicines may increase bruising.   Rest the injured area.   If possible, elevate the injured area to reduce swelling.  SEEK IMMEDIATE MEDICAL CARE IF:    You have increased bruising or swelling.   You have pain that is getting worse.   Your swelling or pain is not relieved with medicines.  MAKE SURE YOU:    Understand these instructions.   Will watch your condition.   Will get help right away if you are not doing well or get worse.  Document Released: 04/14/2005 Document Revised: 07/10/2013 Document Reviewed:  05/10/2011  [employer] Patient Information 2015 [employer]. This information is not intended to replace advice given to you by your health care provider. Make sure you discuss any questions you have with your health care provider.

## 2014-08-15 NOTE — ED Notes (Signed)
Pt. Provided bus pass.   

## 2014-08-15 NOTE — ED Notes (Signed)
Pt. States she was taking her dog out when she slipped on some ice on the steps and twisted her ankle. She states that she hurt her back and her R ankle. Pt. States she hit her head and "went black for a little while." Unknown LOC.
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
## 2014-08-15 NOTE — ED Notes (Signed)
Patient transported to CT 

## 2014-09-20 ENCOUNTER — Emergency Department (HOSPITAL_COMMUNITY)
Admission: EM | Admit: 2014-09-20 | Discharge: 2014-09-20 | Disposition: A | Discharge status: Home / Self Care | Payer: Medicaid Other | Attending: Emergency Medicine | Admitting: Emergency Medicine

## 2014-09-20 ENCOUNTER — Emergency Department (HOSPITAL_COMMUNITY): Payer: Medicaid Other

## 2014-09-20 ENCOUNTER — Encounter (HOSPITAL_COMMUNITY): Payer: Self-pay | Admitting: Family Medicine

## 2014-09-20 DIAGNOSIS — Z79899 Other long term (current) drug therapy: Secondary | ICD-10-CM | POA: Diagnosis not present

## 2014-09-20 DIAGNOSIS — S29002A Unspecified injury of muscle and tendon of back wall of thorax, initial encounter: Secondary | ICD-10-CM | POA: Insufficient documentation

## 2014-09-20 DIAGNOSIS — Z8742 Personal history of other diseases of the female genital tract: Secondary | ICD-10-CM | POA: Diagnosis not present

## 2014-09-20 DIAGNOSIS — Z87440 Personal history of urinary (tract) infections: Secondary | ICD-10-CM | POA: Diagnosis not present

## 2014-09-20 DIAGNOSIS — S3992XA Unspecified injury of lower back, initial encounter: Secondary | ICD-10-CM | POA: Diagnosis not present

## 2014-09-20 DIAGNOSIS — S4992XA Unspecified injury of left shoulder and upper arm, initial encounter: Secondary | ICD-10-CM | POA: Diagnosis present

## 2014-09-20 DIAGNOSIS — W010XXA Fall on same level from slipping, tripping and stumbling without subsequent striking against object, initial encounter: Secondary | ICD-10-CM | POA: Insufficient documentation

## 2014-09-20 DIAGNOSIS — F419 Anxiety disorder, unspecified: Secondary | ICD-10-CM | POA: Insufficient documentation

## 2014-09-20 DIAGNOSIS — Y998 Other external cause status: Secondary | ICD-10-CM | POA: Insufficient documentation

## 2014-09-20 DIAGNOSIS — Z87442 Personal history of urinary calculi: Secondary | ICD-10-CM | POA: Insufficient documentation

## 2014-09-20 DIAGNOSIS — Y9289 Other specified places as the place of occurrence of the external cause: Secondary | ICD-10-CM | POA: Insufficient documentation

## 2014-09-20 DIAGNOSIS — Z720 Tobacco use: Secondary | ICD-10-CM | POA: Insufficient documentation

## 2014-09-20 DIAGNOSIS — Z8709 Personal history of other diseases of the respiratory system: Secondary | ICD-10-CM | POA: Insufficient documentation

## 2014-09-20 DIAGNOSIS — K219 Gastro-esophageal reflux disease without esophagitis: Secondary | ICD-10-CM | POA: Diagnosis not present

## 2014-09-20 DIAGNOSIS — F3131 Bipolar disorder, current episode depressed, mild: Secondary | ICD-10-CM | POA: Insufficient documentation

## 2014-09-20 DIAGNOSIS — Z8639 Personal history of other endocrine, nutritional and metabolic disease: Secondary | ICD-10-CM | POA: Insufficient documentation

## 2014-09-20 DIAGNOSIS — Y9389 Activity, other specified: Secondary | ICD-10-CM | POA: Insufficient documentation

## 2014-09-20 DIAGNOSIS — M545 Low back pain: Secondary | ICD-10-CM

## 2014-09-20 DIAGNOSIS — Z791 Long term (current) use of non-steroidal anti-inflammatories (NSAID): Secondary | ICD-10-CM | POA: Diagnosis not present

## 2014-09-20 MED ORDER — OXYCODONE-ACETAMINOPHEN 5-325 MG PO TABS: 2.0000 | ORAL_TABLET | ORAL | Status: DC | PRN

## 2014-09-20 MED ORDER — HYDROCODONE-ACETAMINOPHEN 5-325 MG PO TABS
2.0000 | ORAL_TABLET | Freq: Once | ORAL | Status: AC
Start: 2014-09-20 — End: 2014-09-20
  Administered 2014-09-20: 2 via ORAL
  Filled 2014-09-20: qty 2

## 2014-09-20 MED ORDER — IBUPROFEN 800 MG PO TABS: 800.0000 mg | ORAL_TABLET | Freq: Three times a day (TID) | ORAL | Status: DC

## 2014-09-20 NOTE — ED Notes (Signed)
Pt here for back and neck pain. sts she slipped and fell in the shower. Denies hitting head or LOC.

## 2014-09-20 NOTE — ED Notes (Signed)
Pt given bus pass at her request.

## 2014-09-20 NOTE — ED Notes (Signed)
States slipped getting into shower this am. C/O neck and bilateral shoulder pain. Denies LOC. Also c/o mid back pain.

## 2014-09-20 NOTE — Discharge Instructions (Signed)
Back Pain, Adult Low back pain is very common. About 1 in 5 people have back pain.The cause of low back pain is rarely dangerous. The pain often gets better over time.About half of people with a sudden onset of back pain feel better in just 2 weeks. About 8 in 10 people feel better by 6 weeks.  CAUSES Some common causes of back pain include:  Strain of the muscles or ligaments supporting the spine.  Wear and tear (degeneration) of the spinal discs.  Arthritis.  Direct injury to the back. DIAGNOSIS Most of the time, the direct cause of low back pain is not known.However, back pain can be treated effectively even when the exact cause of the pain is unknown.Answering your caregiver's questions about your overall health and symptoms is one of the most accurate ways to make sure the cause of your pain is not dangerous. If your caregiver needs more information, he or she may order lab work or imaging tests (X-rays or MRIs).However, even if imaging tests show changes in your back, this usually does not require surgery. HOME CARE INSTRUCTIONS For many people, back pain returns.Since low back pain is rarely dangerous, it is often a condition that people can learn to manageon their own.   Remain active. It is stressful on the back to sit or stand in one place. Do not sit, drive, or stand in one place for more than 30 minutes at a time. Take short walks on level surfaces as soon as pain allows.Try to increase the length of time you walk each day.  Do not stay in bed.Resting more than 1 or 2 days can delay your recovery.  Do not avoid exercise or work.Your body is made to move.It is not dangerous to be active, even though your back may hurt.Your back will likely heal faster if you return to being active before your pain is gone.  Pay attention to your body when you bend and lift. Many people have less discomfortwhen lifting if they bend their knees, keep the load close to their bodies,and  avoid twisting. Often, the most comfortable positions are those that put less stress on your recovering back.  Find a comfortable position to sleep. Use a firm mattress and lie on your side with your knees slightly bent. If you lie on your back, put a pillow under your knees.  Only take over-the-counter or prescription medicines as directed by your caregiver. Over-the-counter medicines to reduce pain and inflammation are often the most helpful.Your caregiver may prescribe muscle relaxant drugs.These medicines help dull your pain so you can more quickly return to your normal activities and healthy exercise.  Put ice on the injured area.  Put ice in a plastic bag.  Place a towel between your skin and the bag.  Leave the ice on for 15-20 minutes, 03-04 times a day for the first 2 to 3 days. After that, ice and heat may be alternated to reduce pain and spasms.  Ask your caregiver about trying back exercises and gentle massage. This may be of some benefit.  Avoid feeling anxious or stressed.Stress increases muscle tension and can worsen back pain.It is important to recognize when you are anxious or stressed and learn ways to manage it.Exercise is a great option. SEEK MEDICAL CARE IF:  You have pain that is not relieved with rest or medicine.  You have pain that does not improve in 1 week.  You have new symptoms.  You are generally not feeling well. SEEK   IMMEDIATE MEDICAL CARE IF:   You have pain that radiates from your back into your legs.  You develop new bowel or bladder control problems.  You have unusual weakness or numbness in your arms or legs.  You develop nausea or vomiting.  You develop abdominal pain.  You feel faint. Document Released: 07/05/2005 Document Revised: 01/04/2012 Document Reviewed: 11/06/2013 [employer] Patient Information 2015 [employer]. This information is not intended to replace advice given to you by your health care provider. Make sure you  discuss any questions you have with your health care provider.  

## 2014-09-20 NOTE — ED Notes (Signed)
Pt asking for more pain medication.
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
## 2014-10-07 ENCOUNTER — Encounter (HOSPITAL_COMMUNITY): Payer: Self-pay | Admitting: Emergency Medicine

## 2014-10-07 ENCOUNTER — Emergency Department (HOSPITAL_COMMUNITY)
Admission: EM | Admit: 2014-10-07 | Discharge: 2014-10-07 | Disposition: A | Discharge status: Home / Self Care | Payer: Medicaid Other | Attending: Emergency Medicine | Admitting: Emergency Medicine

## 2014-10-07 DIAGNOSIS — N898 Other specified noninflammatory disorders of vagina: Secondary | ICD-10-CM | POA: Diagnosis present

## 2014-10-07 DIAGNOSIS — F319 Bipolar disorder, unspecified: Secondary | ICD-10-CM | POA: Diagnosis not present

## 2014-10-07 DIAGNOSIS — K219 Gastro-esophageal reflux disease without esophagitis: Secondary | ICD-10-CM | POA: Insufficient documentation

## 2014-10-07 DIAGNOSIS — Z87440 Personal history of urinary (tract) infections: Secondary | ICD-10-CM | POA: Diagnosis not present

## 2014-10-07 DIAGNOSIS — F419 Anxiety disorder, unspecified: Secondary | ICD-10-CM | POA: Insufficient documentation

## 2014-10-07 DIAGNOSIS — Z720 Tobacco use: Secondary | ICD-10-CM | POA: Diagnosis not present

## 2014-10-07 DIAGNOSIS — Z8709 Personal history of other diseases of the respiratory system: Secondary | ICD-10-CM | POA: Diagnosis not present

## 2014-10-07 DIAGNOSIS — A5901 Trichomonal vulvovaginitis: Secondary | ICD-10-CM | POA: Insufficient documentation

## 2014-10-07 DIAGNOSIS — Z8639 Personal history of other endocrine, nutritional and metabolic disease: Secondary | ICD-10-CM | POA: Diagnosis not present

## 2014-10-07 DIAGNOSIS — Z79899 Other long term (current) drug therapy: Secondary | ICD-10-CM | POA: Diagnosis not present

## 2014-10-07 DIAGNOSIS — Z3202 Encounter for pregnancy test, result negative: Secondary | ICD-10-CM | POA: Diagnosis not present

## 2014-10-07 DIAGNOSIS — Z792 Long term (current) use of antibiotics: Secondary | ICD-10-CM | POA: Diagnosis not present

## 2014-10-07 DIAGNOSIS — A599 Trichomoniasis, unspecified: Secondary | ICD-10-CM

## 2014-10-07 DIAGNOSIS — N12 Tubulo-interstitial nephritis, not specified as acute or chronic: Secondary | ICD-10-CM

## 2014-10-07 DIAGNOSIS — Z87442 Personal history of urinary calculi: Secondary | ICD-10-CM | POA: Diagnosis not present

## 2014-10-07 LAB — WET PREP, GENITAL
Clue Cells Wet Prep HPF POC: NONE SEEN
YEAST WET PREP: NONE SEEN

## 2014-10-07 LAB — CBC WITH DIFFERENTIAL/PLATELET
Basophils Absolute: 0 10*3/uL (ref 0.0–0.1)
Basophils Relative: 0 % (ref 0–1)
Eosinophils Absolute: 0.2 10*3/uL (ref 0.0–0.7)
Eosinophils Relative: 2 % (ref 0–5)
HEMATOCRIT: 41.4 % (ref 36.0–46.0)
HEMOGLOBIN: 13.7 g/dL (ref 12.0–15.0)
LYMPHS PCT: 26 % (ref 12–46)
Lymphs Abs: 1.9 10*3/uL (ref 0.7–4.0)
MCH: 28.8 pg (ref 26.0–34.0)
MCHC: 33.1 g/dL (ref 30.0–36.0)
MCV: 87 fL (ref 78.0–100.0)
MONO ABS: 0.4 10*3/uL (ref 0.1–1.0)
Monocytes Relative: 6 % (ref 3–12)
NEUTROS ABS: 4.7 10*3/uL (ref 1.7–7.7)
NEUTROS PCT: 66 % (ref 43–77)
Platelets: 302 10*3/uL (ref 150–400)
RBC: 4.76 MIL/uL (ref 3.87–5.11)
RDW: 13.2 % (ref 11.5–15.5)
WBC: 7.1 10*3/uL (ref 4.0–10.5)

## 2014-10-07 LAB — URINALYSIS, ROUTINE W REFLEX MICROSCOPIC
Bilirubin Urine: NEGATIVE
GLUCOSE, UA: NEGATIVE mg/dL
Ketones, ur: NEGATIVE mg/dL
Nitrite: NEGATIVE
PH: 7 (ref 5.0–8.0)
Protein, ur: NEGATIVE mg/dL
Specific Gravity, Urine: 1.015 (ref 1.005–1.030)
Urobilinogen, UA: 0.2 mg/dL (ref 0.0–1.0)

## 2014-10-07 LAB — BASIC METABOLIC PANEL
ANION GAP: 6 (ref 5–15)
BUN: 6 mg/dL (ref 6–23)
CHLORIDE: 107 mmol/L (ref 96–112)
CO2: 26 mmol/L (ref 19–32)
CREATININE: 0.56 mg/dL (ref 0.50–1.10)
Calcium: 8.8 mg/dL (ref 8.4–10.5)
GFR calc non Af Amer: >90 mL/min (ref >90)
Glucose, Bld: 105 mg/dL — ABNORMAL HIGH (ref 70–99)
POTASSIUM: 3.8 mmol/L (ref 3.5–5.1)
Sodium: 139 mmol/L (ref 135–145)

## 2014-10-07 LAB — URINE MICROSCOPIC-ADD ON

## 2014-10-07 LAB — PREGNANCY, URINE: Preg Test, Ur: NEGATIVE

## 2014-10-07 MED ORDER — ONDANSETRON HCL 4 MG/2ML IJ SOLN
4.0000 mg | Freq: Once | INTRAMUSCULAR | Status: AC
  Administered 2014-10-07: 4 mg via INTRAVENOUS
  Filled 2014-10-07: qty 2

## 2014-10-07 MED ORDER — ONDANSETRON HCL 4 MG PO TABS: 4.0000 mg | ORAL_TABLET | Freq: Four times a day (QID) | ORAL | Status: DC

## 2014-10-07 MED ORDER — MORPHINE SULFATE 4 MG/ML IJ SOLN
4.0000 mg | Freq: Once | INTRAMUSCULAR | Status: AC
Start: 2014-10-07 — End: 2014-10-07
  Administered 2014-10-07: 4 mg via INTRAVENOUS
  Filled 2014-10-07: qty 1

## 2014-10-07 MED ORDER — CIPROFLOXACIN HCL 500 MG PO TABS: 500.0000 mg | ORAL_TABLET | Freq: Two times a day (BID) | ORAL | Status: DC

## 2014-10-07 MED ORDER — HYDROCODONE-ACETAMINOPHEN 5-325 MG PO TABS: 1.0000 | ORAL_TABLET | Freq: Four times a day (QID) | ORAL | Status: DC | PRN

## 2014-10-07 MED ORDER — FLUCONAZOLE 150 MG PO TABS: 150.0000 mg | ORAL_TABLET | Freq: Every day | ORAL | Status: DC

## 2014-10-07 MED ORDER — AZITHROMYCIN 250 MG PO TABS
1000.0000 mg | ORAL_TABLET | Freq: Once | ORAL | Status: AC
  Administered 2014-10-07: 1000 mg via ORAL
  Filled 2014-10-07: qty 4

## 2014-10-07 MED ORDER — KETOROLAC TROMETHAMINE 30 MG/ML IJ SOLN
30.0000 mg | Freq: Once | INTRAMUSCULAR | Status: AC
  Administered 2014-10-07: 30 mg via INTRAVENOUS
  Filled 2014-10-07: qty 1

## 2014-10-07 MED ORDER — METRONIDAZOLE 500 MG PO TABS: 500.0000 mg | ORAL_TABLET | Freq: Two times a day (BID) | ORAL | Status: DC

## 2014-10-07 MED ORDER — CEFTRIAXONE SODIUM 1 G IJ SOLR
1.0000 g | Freq: Once | INTRAMUSCULAR | Status: AC
  Administered 2014-10-07: 1 g via INTRAVENOUS
  Filled 2014-10-07: qty 10
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
## 2014-10-07 NOTE — ED Notes (Signed)
Pt reports dysuria and right flank pain. Pt also report that she feels like she has a yeast infection, pt has vaginal discharge. Pt also reports right flank pain.

## 2014-10-08 LAB — URINE CULTURE

## 2014-10-08 LAB — GC/CHLAMYDIA PROBE AMP (~~LOC~~) NOT AT ARMC
CHLAMYDIA, DNA PROBE: NEGATIVE
NEISSERIA GONORRHEA: NEGATIVE

## 2014-10-22 ENCOUNTER — Emergency Department (HOSPITAL_COMMUNITY)
Admission: EM | Admit: 2014-10-22 | Discharge: 2014-10-23 | Disposition: A | Discharge status: Home / Self Care | Payer: Medicaid Other | Attending: Emergency Medicine | Admitting: Emergency Medicine

## 2014-10-22 ENCOUNTER — Encounter (HOSPITAL_COMMUNITY): Payer: Self-pay

## 2014-10-22 DIAGNOSIS — Z87440 Personal history of urinary (tract) infections: Secondary | ICD-10-CM | POA: Insufficient documentation

## 2014-10-22 DIAGNOSIS — Z792 Long term (current) use of antibiotics: Secondary | ICD-10-CM | POA: Diagnosis not present

## 2014-10-22 DIAGNOSIS — F419 Anxiety disorder, unspecified: Secondary | ICD-10-CM | POA: Diagnosis not present

## 2014-10-22 DIAGNOSIS — Z8639 Personal history of other endocrine, nutritional and metabolic disease: Secondary | ICD-10-CM | POA: Diagnosis not present

## 2014-10-22 DIAGNOSIS — Z8742 Personal history of other diseases of the female genital tract: Secondary | ICD-10-CM | POA: Insufficient documentation

## 2014-10-22 DIAGNOSIS — F332 Major depressive disorder, recurrent severe without psychotic features: Secondary | ICD-10-CM | POA: Diagnosis not present

## 2014-10-22 DIAGNOSIS — Z8709 Personal history of other diseases of the respiratory system: Secondary | ICD-10-CM | POA: Diagnosis not present

## 2014-10-22 DIAGNOSIS — Z79899 Other long term (current) drug therapy: Secondary | ICD-10-CM | POA: Insufficient documentation

## 2014-10-22 DIAGNOSIS — R4589 Other symptoms and signs involving emotional state: Secondary | ICD-10-CM

## 2014-10-22 DIAGNOSIS — Z720 Tobacco use: Secondary | ICD-10-CM | POA: Diagnosis not present

## 2014-10-22 DIAGNOSIS — Z87442 Personal history of urinary calculi: Secondary | ICD-10-CM | POA: Insufficient documentation

## 2014-10-22 DIAGNOSIS — R45850 Homicidal ideations: Secondary | ICD-10-CM | POA: Diagnosis not present

## 2014-10-22 DIAGNOSIS — F329 Major depressive disorder, single episode, unspecified: Secondary | ICD-10-CM | POA: Diagnosis not present

## 2014-10-22 DIAGNOSIS — R45851 Suicidal ideations: Secondary | ICD-10-CM | POA: Diagnosis present

## 2014-10-22 DIAGNOSIS — K219 Gastro-esophageal reflux disease without esophagitis: Secondary | ICD-10-CM | POA: Insufficient documentation

## 2014-10-22 DIAGNOSIS — R4689 Other symptoms and signs involving appearance and behavior: Secondary | ICD-10-CM

## 2014-10-22 LAB — COMPREHENSIVE METABOLIC PANEL
ALBUMIN: 3.9 g/dL (ref 3.5–5.2)
ALT: 63 U/L — ABNORMAL HIGH (ref 0–35)
AST: 49 U/L — ABNORMAL HIGH (ref 0–37)
Alkaline Phosphatase: 71 U/L (ref 39–117)
Anion gap: 11 (ref 5–15)
BILIRUBIN TOTAL: 0.4 mg/dL (ref 0.3–1.2)
BUN: 11 mg/dL (ref 6–23)
CHLORIDE: 104 mmol/L (ref 96–112)
CO2: 23 mmol/L (ref 19–32)
CREATININE: 0.52 mg/dL (ref 0.50–1.10)
Calcium: 8.5 mg/dL (ref 8.4–10.5)
GFR calc Af Amer: >90 mL/min (ref >90)
GFR calc non Af Amer: >90 mL/min (ref >90)
Glucose, Bld: 99 mg/dL (ref 70–99)
POTASSIUM: 3.5 mmol/L (ref 3.5–5.1)
Sodium: 138 mmol/L (ref 135–145)
Total Protein: 7.6 g/dL (ref 6.0–8.3)

## 2014-10-22 LAB — RAPID URINE DRUG SCREEN, HOSP PERFORMED
Amphetamines: NOT DETECTED
BENZODIAZEPINES: NOT DETECTED
Barbiturates: POSITIVE — AB
Cocaine: NOT DETECTED
OPIATES: NOT DETECTED
TETRAHYDROCANNABINOL: NOT DETECTED

## 2014-10-22 LAB — CBC WITH DIFFERENTIAL/PLATELET
Basophils Absolute: 0 10*3/uL (ref 0.0–0.1)
Basophils Relative: 0 % (ref 0–1)
EOS ABS: 0.2 10*3/uL (ref 0.0–0.7)
EOS PCT: 3 % (ref 0–5)
HCT: 39.1 % (ref 36.0–46.0)
HEMOGLOBIN: 13.2 g/dL (ref 12.0–15.0)
Lymphocytes Relative: 31 % (ref 12–46)
Lymphs Abs: 2.2 10*3/uL (ref 0.7–4.0)
MCH: 29.3 pg (ref 26.0–34.0)
MCHC: 33.8 g/dL (ref 30.0–36.0)
MCV: 86.9 fL (ref 78.0–100.0)
MONOS PCT: 6 % (ref 3–12)
Monocytes Absolute: 0.5 10*3/uL (ref 0.1–1.0)
NEUTROS ABS: 4.2 10*3/uL (ref 1.7–7.7)
Neutrophils Relative %: 60 % (ref 43–77)
PLATELETS: 302 10*3/uL (ref 150–400)
RBC: 4.5 MIL/uL (ref 3.87–5.11)
RDW: 13.1 % (ref 11.5–15.5)
WBC: 7.1 10*3/uL (ref 4.0–10.5)

## 2014-10-22 LAB — ETHANOL: Alcohol, Ethyl (B): <5 mg/dL (ref 0–9)

## 2014-10-22 LAB — ACETAMINOPHEN LEVEL: Acetaminophen (Tylenol), Serum: <10 ug/mL — ABNORMAL LOW (ref 10–30)

## 2014-10-22 LAB — SALICYLATE LEVEL

## 2014-10-22 MED ORDER — ASPIRIN-ACETAMINOPHEN-CAFFEINE 250-250-65 MG PO TABS
2.0000 | ORAL_TABLET | Freq: Every day | ORAL | Status: DC | PRN
  Administered 2014-10-23: 2 via ORAL
  Filled 2014-10-22 (×3): qty 2

## 2014-10-22 MED ORDER — LORAZEPAM 1 MG PO TABS
1.0000 mg | ORAL_TABLET | Freq: Three times a day (TID) | ORAL | Status: DC | PRN
  Administered 2014-10-22 – 2014-10-23 (×3): 1 mg via ORAL
  Filled 2014-10-22 (×3): qty 1

## 2014-10-22 MED ORDER — ALBUTEROL SULFATE HFA 108 (90 BASE) MCG/ACT IN AERS: 2.0000 | INHALATION_SPRAY | RESPIRATORY_TRACT | Status: DC | PRN

## 2014-10-22 MED ORDER — ACETAMINOPHEN 500 MG PO TABS
1000.0000 mg | ORAL_TABLET | Freq: Once | ORAL | Status: AC
  Administered 2014-10-22: 1000 mg via ORAL
  Filled 2014-10-22: qty 2

## 2014-10-22 MED ORDER — ACETAMINOPHEN 325 MG PO TABS: 650.0000 mg | ORAL_TABLET | ORAL | Status: DC | PRN

## 2014-10-22 MED ORDER — IBUPROFEN 200 MG PO TABS
600.0000 mg | ORAL_TABLET | Freq: Three times a day (TID) | ORAL | Status: DC | PRN
  Administered 2014-10-23: 600 mg via ORAL
  Filled 2014-10-22: qty 3

## 2014-10-22 MED ORDER — PANTOPRAZOLE SODIUM 40 MG PO TBEC
40.0000 mg | DELAYED_RELEASE_TABLET | Freq: Every day | ORAL | Status: DC
  Administered 2014-10-23: 40 mg via ORAL
  Filled 2014-10-22: qty 1

## 2014-10-22 MED ORDER — TRAZODONE HCL 100 MG PO TABS: 100.0000 mg | ORAL_TABLET | Freq: Every day | ORAL | Status: DC

## 2014-10-22 MED ORDER — ONDANSETRON HCL 4 MG PO TABS: 4.0000 mg | ORAL_TABLET | Freq: Three times a day (TID) | ORAL | Status: DC | PRN

## 2014-10-22 MED ORDER — ZOLPIDEM TARTRATE 5 MG PO TABS
5.0000 mg | ORAL_TABLET | Freq: Every evening | ORAL | Status: DC | PRN
  Administered 2014-10-22: 5 mg via ORAL
  Filled 2014-10-22: qty 1

## 2014-10-22 MED ORDER — NICOTINE 21 MG/24HR TD PT24
21.0000 mg | MEDICATED_PATCH | Freq: Every day | TRANSDERMAL | Status: DC
  Administered 2014-10-22 – 2014-10-23 (×2): 21 mg via TRANSDERMAL
  Filled 2014-10-22 (×2): qty 1
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
## 2014-10-22 NOTE — ED Notes (Signed)
39 y/o w/f admitted to psych E.D with passive S/I. Pt c/o  Uncontrollable panic attacks and noncompliance of meds " I was unable to refill my medications due to no ride ." Pt is pleasant oriented X3

## 2014-10-22 NOTE — ED Notes (Signed)
Bed: [id number] Expected date:  Expected time:  Means of arrival:  Comments: Hold for TCU #[id number]
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
## 2014-10-22 NOTE — ED Notes (Signed)
Pt sleeping at present, no distress noted, calm & cooperative, monitoring for safety, Q 15 min checks in effect, remains passive SI.

## 2014-10-22 NOTE — ED Notes (Signed)
TTS in process at bedside at this time  

## 2014-10-22 NOTE — ED Notes (Signed)
Pt states she has been having panic attacks and thoughts of suicide for 1 month.  Stopped psych meds x 2 months ago.  Zoloft, lamictal, klonopin, ambien.  Pt also c/o headache

## 2014-10-22 NOTE — ED Notes (Signed)
Pt AAO x 3, no distress noted, watching TV at present, remains passive SI, no specific plan.  Denies HI or AV hallucinations.  Monitoring for safety, Q 15 min checks in effect.

## 2014-10-23 DIAGNOSIS — F332 Major depressive disorder, recurrent severe without psychotic features: Secondary | ICD-10-CM | POA: Diagnosis not present

## 2014-10-23 DIAGNOSIS — R45850 Homicidal ideations: Secondary | ICD-10-CM

## 2014-10-23 DIAGNOSIS — R4689 Other symptoms and signs involving appearance and behavior: Secondary | ICD-10-CM

## 2014-10-23 DIAGNOSIS — F419 Anxiety disorder, unspecified: Secondary | ICD-10-CM | POA: Insufficient documentation

## 2014-10-23 DIAGNOSIS — R4589 Other symptoms and signs involving emotional state: Secondary | ICD-10-CM | POA: Insufficient documentation

## 2014-10-23 MED ORDER — TRAZODONE HCL 100 MG PO TABS: 100.0000 mg | ORAL_TABLET | Freq: Every evening | ORAL | Status: DC | PRN

## 2014-10-23 MED ORDER — LORATADINE 10 MG PO TABS
10.0000 mg | ORAL_TABLET | Freq: Every day | ORAL | Status: DC
  Administered 2014-10-23: 10 mg via ORAL
  Filled 2014-10-23: qty 1

## 2014-10-23 MED ORDER — SERTRALINE HCL 50 MG PO TABS
50.0000 mg | ORAL_TABLET | Freq: Every day | ORAL | Status: DC
  Administered 2014-10-23: 50 mg via ORAL
  Filled 2014-10-23: qty 1

## 2014-10-23 MED ORDER — QUETIAPINE FUMARATE 100 MG PO TABS: 100.0000 mg | ORAL_TABLET | Freq: Every day | ORAL | Status: DC

## 2014-10-23 MED ORDER — BUSPIRONE HCL 10 MG PO TABS
10.0000 mg | ORAL_TABLET | Freq: Two times a day (BID) | ORAL | Status: DC
  Administered 2014-10-23: 10 mg via ORAL
  Filled 2014-10-23: qty 1

## 2014-10-23 NOTE — ED Notes (Signed)
Patient eating breakfast.  She spoke with MD and NP.  She states she has been off her medications for 3 months.  She was unable to obtain transportation to get to her psychiatrist because he is not on a bus route.  She requested her medications, as well as klonopin and ambien.  Patient was informed by MD that she would not be restarted on these medication due to addiction potential.  She states she suffers from panic attacks and severe depression daily.  [facility].  [facility].  She will be discharged today per MD.  Patient is cooperative/calm; her behavior is appropriate.  She contracts for safety on the unit.  Patient c/o severe headache; given excedrin caffeine tabs.

## 2014-10-23 NOTE — ED Notes (Signed)
Patient is calm and cooperative at the moment.  She is reading quietly in her room.  Patient passively SI; denies HI/AVH.

## 2014-10-23 NOTE — [facility] Counselor (Signed)
Per Dr. Agustin Creearlene, patient accepted [facility] pending [facility]C.  Darlene requesting patient to be IVC'd if not already completed. IVC to be faxed to [phone]. The accepting provider is Dr. Eliott Nineunham. Nursing report # will be given once Darlene receives the IVC paperwork. Patient's room # is 2592.

## 2014-10-23 NOTE — ED Notes (Signed)
Pt sleeping at present, no distress noted, respirations even and unlabored.  Monitoring for safety, Q 15 min checks in effect. 

## 2014-10-23 NOTE — BH Assessment (Signed)
Seeking placement. [facility] to: [facility] [place], [facility], [address], [facility], [facility]   [name], [place] Triage Specialist 10/23/2014 12:37 AM

## 2014-10-23 NOTE — ED Notes (Signed)
Patient [facility].  She is angry and upset.  She immediately requested to be discharged.  Patient was informed that she is being involuntarily committed.  She did not understand why.  She was informed that in order to transport her and due to her have SI, it was necessary.  Patient called her boyfriend and immediately asked to speak with MD.  Explained to patient that MD was not available, but we did have an NP.  She states that the excedrin medication for her headache did not work.  She states, "I'm stressed out."  Law enforcement currently serving patient with IVC paperwork.
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
## 2014-10-23 NOTE — ED Notes (Signed)
Patient discharged per MD order.  [facility]'s dept here to transport to [facility].  Patient is ambulatory and physically stable.  Patient left without incident.

## 2014-10-23 NOTE — ED Notes (Signed)
Patient requesting ativan.  Last dose was at 550.  Explained to patient it is ordered every 8 hours.  Patient then asked when she will be discharged.

## 2014-10-23 NOTE — BH Assessment (Signed)
Per Dr. Darlene, patient accepted [facility] pending [facility]C. EDP must IVC patient and fax the appropriate paperwork to Darlene @ #[phone]. The accepting provider is Dr. Eliott Nineunham. Nursing report # will be given once Darlene receives the IVC paperwork. Patient's room # is [id number].

## 2014-10-23 NOTE — ED Notes (Signed)
Patient signed belongings sheet.  States her headache pain remains as an 8.  She is more accepting of transferring to [facility].

## 2014-10-24 DIAGNOSIS — F1320 Sedative, hypnotic or anxiolytic dependence, uncomplicated: Secondary | ICD-10-CM | POA: Diagnosis present

## 2014-10-29 ENCOUNTER — Emergency Department (HOSPITAL_COMMUNITY): Payer: Medicaid Other

## 2014-10-29 ENCOUNTER — Encounter (HOSPITAL_COMMUNITY): Payer: Self-pay | Admitting: Emergency Medicine

## 2014-10-29 ENCOUNTER — Emergency Department (HOSPITAL_COMMUNITY)
Admission: EM | Admit: 2014-10-29 | Discharge: 2014-10-29 | Disposition: A | Discharge status: Home / Self Care | Payer: Medicaid Other | Attending: Emergency Medicine | Admitting: Emergency Medicine

## 2014-10-29 DIAGNOSIS — S99922A Unspecified injury of left foot, initial encounter: Secondary | ICD-10-CM | POA: Diagnosis present

## 2014-10-29 DIAGNOSIS — Y998 Other external cause status: Secondary | ICD-10-CM | POA: Insufficient documentation

## 2014-10-29 DIAGNOSIS — W2203XA Walked into furniture, initial encounter: Secondary | ICD-10-CM | POA: Diagnosis not present

## 2014-10-29 DIAGNOSIS — Z8742 Personal history of other diseases of the female genital tract: Secondary | ICD-10-CM | POA: Diagnosis not present

## 2014-10-29 DIAGNOSIS — F329 Major depressive disorder, single episode, unspecified: Secondary | ICD-10-CM | POA: Insufficient documentation

## 2014-10-29 DIAGNOSIS — Z8709 Personal history of other diseases of the respiratory system: Secondary | ICD-10-CM | POA: Diagnosis not present

## 2014-10-29 DIAGNOSIS — Z8639 Personal history of other endocrine, nutritional and metabolic disease: Secondary | ICD-10-CM | POA: Insufficient documentation

## 2014-10-29 DIAGNOSIS — Z87440 Personal history of urinary (tract) infections: Secondary | ICD-10-CM | POA: Insufficient documentation

## 2014-10-29 DIAGNOSIS — Z720 Tobacco use: Secondary | ICD-10-CM | POA: Diagnosis not present

## 2014-10-29 DIAGNOSIS — F419 Anxiety disorder, unspecified: Secondary | ICD-10-CM | POA: Insufficient documentation

## 2014-10-29 DIAGNOSIS — K219 Gastro-esophageal reflux disease without esophagitis: Secondary | ICD-10-CM | POA: Diagnosis not present

## 2014-10-29 DIAGNOSIS — Z87442 Personal history of urinary calculi: Secondary | ICD-10-CM | POA: Diagnosis not present

## 2014-10-29 DIAGNOSIS — Y9289 Other specified places as the place of occurrence of the external cause: Secondary | ICD-10-CM | POA: Diagnosis not present

## 2014-10-29 DIAGNOSIS — Y9389 Activity, other specified: Secondary | ICD-10-CM | POA: Insufficient documentation

## 2014-10-29 DIAGNOSIS — M79675 Pain in left toe(s): Secondary | ICD-10-CM

## 2014-10-29 MED ORDER — ACETAMINOPHEN 500 MG PO TABS
500.0000 mg | ORAL_TABLET | Freq: Once | ORAL | Status: AC
  Administered 2014-10-29: 500 mg via ORAL
  Filled 2014-10-29: qty 1

## 2014-10-29 MED ORDER — NAPROXEN 250 MG PO TABS
250.0000 mg | ORAL_TABLET | Freq: Two times a day (BID) | ORAL | Status: DC
Start: 2014-10-29 — End: 2014-11-03
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
## 2014-10-29 NOTE — Discharge Instructions (Signed)
Foot Sprain The muscles and cord like structures which attach muscle to bone (tendons) that surround the feet are made up of units. A foot sprain can occur at the weakest spot in any of these units. This condition is most often caused by injury to or overuse of the foot, as from playing contact sports, or aggravating a previous injury, or from poor conditioning, or obesity. SYMPTOMS  Pain with movement of the foot.  Tenderness and swelling at the injury site.  Loss of strength is present in moderate or severe sprains. THE THREE GRADES OR SEVERITY OF FOOT SPRAIN ARE:  Mild (Grade I): Slightly pulled muscle without tearing of muscle or tendon fibers or loss of strength.  Moderate (Grade II): Tearing of fibers in a muscle, tendon, or at the attachment to bone, with small decrease in strength.  Severe (Grade III): Rupture of the muscle-tendon-bone attachment, with separation of fibers. Severe sprain requires surgical repair. Often repeating (chronic) sprains are caused by overuse. Sudden (acute) sprains are caused by direct injury or over-use. DIAGNOSIS  Diagnosis of this condition is usually by your own observation. If problems continue, a caregiver may be required for further evaluation and treatment. X-rays may be required to make sure there are not breaks in the bones (fractures) present. Continued problems may require physical therapy for treatment. PREVENTION  Use strength and conditioning exercises appropriate for your sport.  Warm up properly prior to working out.  Use athletic shoes that are made for the sport you are participating in.  Allow adequate time for healing. Early return to activities makes repeat injury more likely, and can lead to an unstable arthritic foot that can result in prolonged disability. Mild sprains generally heal in 3 to 10 days, with moderate and severe sprains taking 2 to 10 weeks. Your caregiver can help you determine the proper time required for  healing. HOME CARE INSTRUCTIONS   Apply ice to the injury for 15-20 minutes, 03-04 times per day. Put the ice in a plastic bag and place a towel between the bag of ice and your skin.  An elastic wrap (like an Ace bandage) may be used to keep swelling down.  Keep foot above the level of the heart, or at least raised on a footstool, when swelling and pain are present.  Try to avoid use other than gentle range of motion while the foot is painful. Do not resume use until instructed by your caregiver. Then begin use gradually, not increasing use to the point of pain. If pain does develop, decrease use and continue the above measures, gradually increasing activities that do not cause discomfort, until you gradually achieve normal use.  Use crutches if and as instructed, and for the length of time instructed.  Keep injured foot and ankle wrapped between treatments.  Massage foot and ankle for comfort and to keep swelling down. Massage from the toes up towards the knee.  Only take over-the-counter or prescription medicines for pain, discomfort, or fever as directed by your caregiver. SEEK IMMEDIATE MEDICAL CARE IF:   Your pain and swelling increase, or pain is not controlled with medications.  You have loss of feeling in your foot or your foot turns cold or blue.  You develop new, unexplained symptoms, or an increase of the symptoms that brought you to your caregiver. MAKE SURE YOU:   Understand these instructions.  Will watch your condition.  Will get help right away if you are not doing well or get worse. Document Released:   12/25/2001 Document Revised: 09/27/2011 Document Reviewed: 02/22/2008 Exit[employer] Patient Information 2015 [employer]. This information is not intended to replace advice given to you by your health care provider. Make sure you discuss any questions you have with your health care provider.  

## 2014-10-29 NOTE — ED Notes (Signed)
Pt c/o 2nd toe on left side pain x 3 days from injury

## 2014-11-03 ENCOUNTER — Encounter (HOSPITAL_COMMUNITY): Payer: Self-pay

## 2014-11-03 ENCOUNTER — Emergency Department (HOSPITAL_COMMUNITY)
Admission: EM | Admit: 2014-11-03 | Discharge: 2014-11-03 | Disposition: A | Discharge status: Home / Self Care | Payer: Medicaid Other | Attending: Emergency Medicine | Admitting: Emergency Medicine

## 2014-11-03 DIAGNOSIS — Z8742 Personal history of other diseases of the female genital tract: Secondary | ICD-10-CM | POA: Diagnosis not present

## 2014-11-03 DIAGNOSIS — Z7982 Long term (current) use of aspirin: Secondary | ICD-10-CM | POA: Diagnosis not present

## 2014-11-03 DIAGNOSIS — F419 Anxiety disorder, unspecified: Secondary | ICD-10-CM | POA: Insufficient documentation

## 2014-11-03 DIAGNOSIS — Z8709 Personal history of other diseases of the respiratory system: Secondary | ICD-10-CM | POA: Diagnosis not present

## 2014-11-03 DIAGNOSIS — R110 Nausea: Secondary | ICD-10-CM | POA: Insufficient documentation

## 2014-11-03 DIAGNOSIS — Z79899 Other long term (current) drug therapy: Secondary | ICD-10-CM | POA: Insufficient documentation

## 2014-11-03 DIAGNOSIS — Z87440 Personal history of urinary (tract) infections: Secondary | ICD-10-CM | POA: Diagnosis not present

## 2014-11-03 DIAGNOSIS — Z8639 Personal history of other endocrine, nutritional and metabolic disease: Secondary | ICD-10-CM | POA: Insufficient documentation

## 2014-11-03 DIAGNOSIS — K219 Gastro-esophageal reflux disease without esophagitis: Secondary | ICD-10-CM | POA: Insufficient documentation

## 2014-11-03 DIAGNOSIS — F329 Major depressive disorder, single episode, unspecified: Secondary | ICD-10-CM | POA: Diagnosis not present

## 2014-11-03 DIAGNOSIS — M545 Low back pain, unspecified: Secondary | ICD-10-CM

## 2014-11-03 DIAGNOSIS — Z87442 Personal history of urinary calculi: Secondary | ICD-10-CM | POA: Insufficient documentation

## 2014-11-03 DIAGNOSIS — Z720 Tobacco use: Secondary | ICD-10-CM | POA: Insufficient documentation

## 2014-11-03 MED ORDER — CYCLOBENZAPRINE HCL 10 MG PO TABS: 10.0000 mg | ORAL_TABLET | Freq: Two times a day (BID) | ORAL | Status: DC | PRN

## 2014-11-03 MED ORDER — NAPROXEN 500 MG PO TABS: 500.0000 mg | ORAL_TABLET | Freq: Two times a day (BID) | ORAL | Status: DC

## 2014-11-03 MED ORDER — LIDOCAINE 5 % EX PTCH: 1.0000 | MEDICATED_PATCH | CUTANEOUS | Status: DC

## 2014-11-03 NOTE — Discharge Instructions (Signed)
SEEK IMMEDIATE MEDICAL ATTENTION IF: °New numbness, tingling, weakness, or problem with the use of your arms or legs.  °Severe back pain not relieved with medications.  °Change in bowel or bladder control.  °Increasing pain in any areas of the body (such as chest or abdominal pain).  °Shortness of breath, dizziness or fainting.  °Nausea (feeling sick to your stomach), vomiting, fever, or sweats. ° °Back Pain, Adult °Low back pain is very common. About 1 in 5 people have back pain. The cause of low back pain is rarely dangerous. The pain often gets better over time. About half of people with a sudden onset of back pain feel better in just 2 weeks. About 8 in 10 people feel better by 6 weeks.  °CAUSES °Some common causes of back pain include: °· Strain of the muscles or ligaments supporting the spine. °· Wear and tear (degeneration) of the spinal discs. °· Arthritis. °· Direct injury to the back. °DIAGNOSIS °Most of the time, the direct cause of low back pain is not known. However, back pain can be treated effectively even when the exact cause of the pain is unknown. Answering your caregiver's questions about your overall health and symptoms is one of the most accurate ways to make sure the cause of your pain is not dangerous. If your caregiver needs more information, he or she may order lab work or imaging tests (X-rays or MRIs). However, even if imaging tests show changes in your back, this usually does not require surgery. °HOME CARE INSTRUCTIONS °For many people, back pain returns. Since low back pain is rarely dangerous, it is often a condition that people can learn to manage on their own.  °· Remain active. It is stressful on the back to sit or stand in one place. Do not sit, drive, or stand in one place for more than 30 minutes at a time. Take short walks on level surfaces as soon as pain allows. Try to increase the length of time you walk each day. °· Do not stay in bed. Resting more than 1 or 2 days can delay  your recovery. °· Do not avoid exercise or work. Your body is made to move. It is not dangerous to be active, even though your back may hurt. Your back will likely heal faster if you return to being active before your pain is gone. °· Pay attention to your body when you  bend and lift. Many people have less discomfort when lifting if they bend their knees, keep the load close to their bodies, and avoid twisting. Often, the most comfortable positions are those that put less stress on your recovering back. °· Find a comfortable position to sleep. Use a firm mattress and lie on your side with your knees slightly bent. If you lie on your back, put a pillow under your knees. °· Only take over-the-counter or prescription medicines as directed by your caregiver. Over-the-counter medicines to reduce pain and inflammation are often the most helpful. Your caregiver may prescribe muscle relaxant drugs. These medicines help dull your pain so you can more quickly return to your normal activities and healthy exercise. °· Put ice on the injured area. °¨ Put ice in a plastic bag. °¨ Place a towel between your skin and the bag. °¨ Leave the ice on for 15-20 minutes, 03-04 times a day for the first 2 to 3 days. After that, ice and heat may be alternated to reduce pain and spasms. °· Ask your caregiver about trying back exercises and gentle massage. This may be of some benefit. °· Avoid feeling anxious or stressed. Stress   increases muscle tension and can worsen back pain. It is important to recognize when you are anxious or stressed and learn ways to manage it. Exercise is a great option. °SEEK MEDICAL CARE IF: °· You have pain that is not relieved with rest or medicine. °· You have pain that does not improve in 1 week. °· You have new symptoms. °· You are generally not feeling well. °SEEK IMMEDIATE MEDICAL CARE IF:  °· You have pain that radiates from your back into your legs. °· You develop new bowel or bladder control  problems. °· You have unusual weakness or numbness in your arms or legs. °· You develop nausea or vomiting. °· You develop abdominal pain. °· You feel faint. °Document Released: 07/05/2005 Document Revised: 01/04/2012 Document Reviewed: 11/06/2013 °[employer]® Patient Information ©2015 [employer]. This information is not intended to replace advice given to you by your health care provider. Make sure you discuss any questions you have with your health care provider. ° °
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
## 2014-11-03 NOTE — ED Notes (Signed)
She states she felt a "pop" in her low back as she bent over in her shower this morning and has persistent pain there.  She was ambulatory to EMS truck.

## 2014-11-23 ENCOUNTER — Encounter (HOSPITAL_COMMUNITY): Payer: Self-pay | Admitting: *Deleted

## 2014-11-23 ENCOUNTER — Emergency Department (HOSPITAL_COMMUNITY): Payer: Medicaid Other

## 2014-11-23 ENCOUNTER — Emergency Department (HOSPITAL_COMMUNITY)
Admission: EM | Admit: 2014-11-23 | Discharge: 2014-11-23 | Disposition: A | Discharge status: Home / Self Care | Payer: Medicaid Other | Attending: Emergency Medicine | Admitting: Emergency Medicine

## 2014-11-23 DIAGNOSIS — Z87440 Personal history of urinary (tract) infections: Secondary | ICD-10-CM | POA: Insufficient documentation

## 2014-11-23 DIAGNOSIS — M791 Myalgia, unspecified site: Secondary | ICD-10-CM

## 2014-11-23 DIAGNOSIS — Z791 Long term (current) use of non-steroidal anti-inflammatories (NSAID): Secondary | ICD-10-CM | POA: Insufficient documentation

## 2014-11-23 DIAGNOSIS — Z8742 Personal history of other diseases of the female genital tract: Secondary | ICD-10-CM | POA: Diagnosis not present

## 2014-11-23 DIAGNOSIS — K219 Gastro-esophageal reflux disease without esophagitis: Secondary | ICD-10-CM | POA: Diagnosis not present

## 2014-11-23 DIAGNOSIS — Z9049 Acquired absence of other specified parts of digestive tract: Secondary | ICD-10-CM | POA: Insufficient documentation

## 2014-11-23 DIAGNOSIS — R109 Unspecified abdominal pain: Secondary | ICD-10-CM | POA: Diagnosis not present

## 2014-11-23 DIAGNOSIS — Z8639 Personal history of other endocrine, nutritional and metabolic disease: Secondary | ICD-10-CM | POA: Diagnosis not present

## 2014-11-23 DIAGNOSIS — Z8709 Personal history of other diseases of the respiratory system: Secondary | ICD-10-CM | POA: Insufficient documentation

## 2014-11-23 DIAGNOSIS — Z79899 Other long term (current) drug therapy: Secondary | ICD-10-CM | POA: Insufficient documentation

## 2014-11-23 DIAGNOSIS — R350 Frequency of micturition: Secondary | ICD-10-CM | POA: Diagnosis present

## 2014-11-23 DIAGNOSIS — R509 Fever, unspecified: Secondary | ICD-10-CM | POA: Insufficient documentation

## 2014-11-23 DIAGNOSIS — F319 Bipolar disorder, unspecified: Secondary | ICD-10-CM | POA: Diagnosis not present

## 2014-11-23 DIAGNOSIS — Z7982 Long term (current) use of aspirin: Secondary | ICD-10-CM | POA: Insufficient documentation

## 2014-11-23 DIAGNOSIS — F419 Anxiety disorder, unspecified: Secondary | ICD-10-CM | POA: Insufficient documentation

## 2014-11-23 DIAGNOSIS — Z87442 Personal history of urinary calculi: Secondary | ICD-10-CM | POA: Insufficient documentation

## 2014-11-23 DIAGNOSIS — Z720 Tobacco use: Secondary | ICD-10-CM | POA: Insufficient documentation

## 2014-11-23 LAB — URINALYSIS, ROUTINE W REFLEX MICROSCOPIC
BILIRUBIN URINE: NEGATIVE
Glucose, UA: NEGATIVE mg/dL
HGB URINE DIPSTICK: NEGATIVE
Ketones, ur: NEGATIVE mg/dL
Leukocytes, UA: NEGATIVE
Nitrite: NEGATIVE
PH: 5.5 (ref 5.0–8.0)
Protein, ur: NEGATIVE mg/dL
SPECIFIC GRAVITY, URINE: 1.009 (ref 1.005–1.030)
UROBILINOGEN UA: 0.2 mg/dL (ref 0.0–1.0)

## 2014-11-23 LAB — BASIC METABOLIC PANEL
Anion gap: 11 (ref 5–15)
BUN: 9 mg/dL (ref 6–20)
CO2: 19 mmol/L — ABNORMAL LOW (ref 22–32)
Calcium: 8.7 mg/dL — ABNORMAL LOW (ref 8.9–10.3)
Chloride: 106 mmol/L (ref 101–111)
Creatinine, Ser: 0.79 mg/dL (ref 0.44–1.00)
GFR calc Af Amer: >60 mL/min (ref >60)
GFR calc non Af Amer: >60 mL/min (ref >60)
Glucose, Bld: 89 mg/dL (ref 70–99)
Potassium: 3.5 mmol/L (ref 3.5–5.1)
Sodium: 136 mmol/L (ref 135–145)

## 2014-11-23 LAB — CBC WITH DIFFERENTIAL/PLATELET
Basophils Absolute: 0.1 10*3/uL (ref 0.0–0.1)
Basophils Relative: 1 % (ref 0–1)
Eosinophils Absolute: 0.2 10*3/uL (ref 0.0–0.7)
Eosinophils Relative: 3 % (ref 0–5)
HCT: 40.8 % (ref 36.0–46.0)
Hemoglobin: 14 g/dL (ref 12.0–15.0)
Lymphocytes Relative: 38 % (ref 12–46)
Lymphs Abs: 2.5 10*3/uL (ref 0.7–4.0)
MCH: 29.3 pg (ref 26.0–34.0)
MCHC: 34.3 g/dL (ref 30.0–36.0)
MCV: 85.4 fL (ref 78.0–100.0)
Monocytes Absolute: 0.5 10*3/uL (ref 0.1–1.0)
Monocytes Relative: 8 % (ref 3–12)
Neutro Abs: 3.2 10*3/uL (ref 1.7–7.7)
Neutrophils Relative %: 50 % (ref 43–77)
Platelets: 280 10*3/uL (ref 150–400)
RBC: 4.78 MIL/uL (ref 3.87–5.11)
RDW: 13.2 % (ref 11.5–15.5)
WBC: 6.5 10*3/uL (ref 4.0–10.5)

## 2014-11-23 LAB — POC URINE PREG, ED: Preg Test, Ur: NEGATIVE

## 2014-11-23 MED ORDER — HYDROCODONE-ACETAMINOPHEN 5-325 MG PO TABS: 1.0000 | ORAL_TABLET | Freq: Four times a day (QID) | ORAL | Status: DC | PRN

## 2014-11-23 MED ORDER — KETOROLAC TROMETHAMINE 30 MG/ML IJ SOLN
30.0000 mg | Freq: Once | INTRAMUSCULAR | Status: AC
Start: 2014-11-23 — End: 2014-11-23
  Administered 2014-11-23: 30 mg via INTRAVENOUS
  Filled 2014-11-23: qty 1

## 2014-11-23 MED ORDER — FENTANYL CITRATE (PF) 100 MCG/2ML IJ SOLN
100.0000 ug | Freq: Once | INTRAMUSCULAR | Status: AC
  Administered 2014-11-23: 100 ug via INTRAVENOUS
  Filled 2014-11-23: qty 2

## 2014-11-23 MED ORDER — SODIUM CHLORIDE 0.9 % IV BOLUS (SEPSIS)
1000.0000 mL | Freq: Once | INTRAVENOUS | Status: AC
  Administered 2014-11-23: 1000 mL via INTRAVENOUS

## 2014-11-23 MED ORDER — ONDANSETRON HCL 4 MG/2ML IJ SOLN
4.0000 mg | Freq: Once | INTRAMUSCULAR | Status: AC
  Administered 2014-11-23: 4 mg via INTRAVENOUS
  Filled 2014-11-23: qty 2
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
## 2014-11-23 NOTE — ED Notes (Signed)
Pt c/o R groin and L flank pain x  1 week associated with chills, fever, and bodyaches. Pt reports similar symptoms with kidney infection. Also reports dysuria and urinary frequency

## 2014-11-23 NOTE — Discharge Instructions (Signed)
Return here as needed.  Your testing tonight did not show any signs of infection.  Your CT scan did not show any abnormalities.  Follow-up with your primary care doctor

## 2014-11-23 NOTE — ED Notes (Signed)
PT Stating that pain meds are not working anymore. RN Notified.[name]..[name]

## 2014-11-23 NOTE — ED Notes (Signed)
Pt reports having urinary frequency, bodyaches and chills/fever.

## 2014-11-24 LAB — URINE CULTURE
Colony Count: NO GROWTH
Culture: NO GROWTH

## 2014-11-28 ENCOUNTER — Encounter (HOSPITAL_COMMUNITY): Payer: Self-pay | Admitting: *Deleted

## 2014-11-28 ENCOUNTER — Emergency Department (HOSPITAL_COMMUNITY): Payer: Medicaid Other

## 2014-11-28 ENCOUNTER — Emergency Department (HOSPITAL_COMMUNITY)
Admission: EM | Admit: 2014-11-28 | Discharge: 2014-11-28 | Disposition: A | Discharge status: Home / Self Care | Payer: Medicaid Other | Attending: Emergency Medicine | Admitting: Emergency Medicine

## 2014-11-28 ENCOUNTER — Emergency Department (HOSPITAL_COMMUNITY)
Admission: EM | Admit: 2014-11-28 | Discharge: 2014-12-01 | Disposition: A | Discharge status: Home / Self Care | Payer: Medicaid Other | Attending: Emergency Medicine | Admitting: Emergency Medicine

## 2014-11-28 DIAGNOSIS — N343 Urethral syndrome, unspecified: Secondary | ICD-10-CM | POA: Diagnosis not present

## 2014-11-28 DIAGNOSIS — R000 Tachycardia, unspecified: Secondary | ICD-10-CM | POA: Insufficient documentation

## 2014-11-28 DIAGNOSIS — Z79899 Other long term (current) drug therapy: Secondary | ICD-10-CM | POA: Insufficient documentation

## 2014-11-28 DIAGNOSIS — F319 Bipolar disorder, unspecified: Secondary | ICD-10-CM | POA: Diagnosis not present

## 2014-11-28 DIAGNOSIS — R45851 Suicidal ideations: Secondary | ICD-10-CM

## 2014-11-28 DIAGNOSIS — K219 Gastro-esophageal reflux disease without esophagitis: Secondary | ICD-10-CM | POA: Diagnosis not present

## 2014-11-28 DIAGNOSIS — F332 Major depressive disorder, recurrent severe without psychotic features: Secondary | ICD-10-CM | POA: Diagnosis present

## 2014-11-28 DIAGNOSIS — R109 Unspecified abdominal pain: Secondary | ICD-10-CM

## 2014-11-28 DIAGNOSIS — Z87440 Personal history of urinary (tract) infections: Secondary | ICD-10-CM | POA: Insufficient documentation

## 2014-11-28 DIAGNOSIS — Z720 Tobacco use: Secondary | ICD-10-CM | POA: Insufficient documentation

## 2014-11-28 DIAGNOSIS — F329 Major depressive disorder, single episode, unspecified: Secondary | ICD-10-CM | POA: Insufficient documentation

## 2014-11-28 DIAGNOSIS — Z8639 Personal history of other endocrine, nutritional and metabolic disease: Secondary | ICD-10-CM | POA: Insufficient documentation

## 2014-11-28 DIAGNOSIS — Z792 Long term (current) use of antibiotics: Secondary | ICD-10-CM | POA: Diagnosis not present

## 2014-11-28 DIAGNOSIS — Z8742 Personal history of other diseases of the female genital tract: Secondary | ICD-10-CM | POA: Insufficient documentation

## 2014-11-28 DIAGNOSIS — F419 Anxiety disorder, unspecified: Secondary | ICD-10-CM | POA: Insufficient documentation

## 2014-11-28 DIAGNOSIS — Z3202 Encounter for pregnancy test, result negative: Secondary | ICD-10-CM | POA: Diagnosis not present

## 2014-11-28 DIAGNOSIS — R300 Dysuria: Secondary | ICD-10-CM

## 2014-11-28 DIAGNOSIS — Z8709 Personal history of other diseases of the respiratory system: Secondary | ICD-10-CM | POA: Insufficient documentation

## 2014-11-28 DIAGNOSIS — Z87442 Personal history of urinary calculi: Secondary | ICD-10-CM | POA: Insufficient documentation

## 2014-11-28 LAB — BASIC METABOLIC PANEL
ANION GAP: 2 — AB (ref 5–15)
BUN: 8 mg/dL (ref 6–20)
CHLORIDE: 114 mmol/L — AB (ref 101–111)
CO2: 20 mmol/L — AB (ref 22–32)
Calcium: 8.4 mg/dL — ABNORMAL LOW (ref 8.9–10.3)
Creatinine, Ser: 0.57 mg/dL (ref 0.44–1.00)
GFR calc Af Amer: >60 mL/min (ref >60)
GFR calc non Af Amer: >60 mL/min (ref >60)
Glucose, Bld: 88 mg/dL (ref 65–99)
Potassium: 3.6 mmol/L (ref 3.5–5.1)
Sodium: 136 mmol/L (ref 135–145)

## 2014-11-28 LAB — COMPREHENSIVE METABOLIC PANEL
ALT: 105 U/L — AB (ref 14–54)
AST: 96 U/L — ABNORMAL HIGH (ref 15–41)
Albumin: 3.6 g/dL (ref 3.5–5.0)
Alkaline Phosphatase: 74 U/L (ref 38–126)
Anion gap: 8 (ref 5–15)
BILIRUBIN TOTAL: 0.5 mg/dL (ref 0.3–1.2)
BUN: 8 mg/dL (ref 6–20)
CALCIUM: 8.6 mg/dL — AB (ref 8.9–10.3)
CO2: 20 mmol/L — ABNORMAL LOW (ref 22–32)
Chloride: 109 mmol/L (ref 101–111)
Creatinine, Ser: 0.83 mg/dL (ref 0.44–1.00)
GFR calc non Af Amer: >60 mL/min (ref >60)
Glucose, Bld: 133 mg/dL — ABNORMAL HIGH (ref 65–99)
Potassium: 3.5 mmol/L (ref 3.5–5.1)
Sodium: 137 mmol/L (ref 135–145)
Total Protein: 7.2 g/dL (ref 6.5–8.1)

## 2014-11-28 LAB — URINALYSIS, ROUTINE W REFLEX MICROSCOPIC
GLUCOSE, UA: NEGATIVE mg/dL
Ketones, ur: NEGATIVE mg/dL
Leukocytes, UA: NEGATIVE
Nitrite: NEGATIVE
PH: 5.5 (ref 5.0–8.0)
PROTEIN: NEGATIVE mg/dL
SPECIFIC GRAVITY, URINE: 1.028 (ref 1.005–1.030)
Urobilinogen, UA: 1 mg/dL (ref 0.0–1.0)

## 2014-11-28 LAB — CBC WITH DIFFERENTIAL/PLATELET
BASOS PCT: 0 % (ref 0–1)
Basophils Absolute: 0 10*3/uL (ref 0.0–0.1)
EOS ABS: 0.3 10*3/uL (ref 0.0–0.7)
EOS PCT: 4 % (ref 0–5)
HEMATOCRIT: 37.1 % (ref 36.0–46.0)
HEMOGLOBIN: 12.6 g/dL (ref 12.0–15.0)
Lymphocytes Relative: 34 % (ref 12–46)
Lymphs Abs: 2.6 10*3/uL (ref 0.7–4.0)
MCH: 28.9 pg (ref 26.0–34.0)
MCHC: 34 g/dL (ref 30.0–36.0)
MCV: 85.1 fL (ref 78.0–100.0)
MONO ABS: 0.5 10*3/uL (ref 0.1–1.0)
MONOS PCT: 7 % (ref 3–12)
NEUTROS PCT: 55 % (ref 43–77)
Neutro Abs: 4.1 10*3/uL (ref 1.7–7.7)
Platelets: 281 10*3/uL (ref 150–400)
RBC: 4.36 MIL/uL (ref 3.87–5.11)
RDW: 12.7 % (ref 11.5–15.5)
WBC: 7.5 10*3/uL (ref 4.0–10.5)

## 2014-11-28 LAB — POC URINE PREG, ED: Preg Test, Ur: NEGATIVE

## 2014-11-28 LAB — CBC
HCT: 36.3 % (ref 36.0–46.0)
Hemoglobin: 12.2 g/dL (ref 12.0–15.0)
MCH: 29.1 pg (ref 26.0–34.0)
MCHC: 33.6 g/dL (ref 30.0–36.0)
MCV: 86.6 fL (ref 78.0–100.0)
Platelets: 264 10*3/uL (ref 150–400)
RBC: 4.19 MIL/uL (ref 3.87–5.11)
RDW: 13 % (ref 11.5–15.5)
WBC: 5.1 10*3/uL (ref 4.0–10.5)

## 2014-11-28 LAB — RAPID URINE DRUG SCREEN, HOSP PERFORMED
Amphetamines: POSITIVE — AB
BENZODIAZEPINES: POSITIVE — AB
Barbiturates: NOT DETECTED
Cocaine: POSITIVE — AB
Opiates: POSITIVE — AB
Tetrahydrocannabinol: NOT DETECTED

## 2014-11-28 LAB — URINE MICROSCOPIC-ADD ON

## 2014-11-28 LAB — LIPASE, BLOOD: Lipase: 24 U/L (ref 22–51)

## 2014-11-28 LAB — I-STAT CG4 LACTIC ACID, ED: LACTIC ACID, VENOUS: 1.13 mmol/L (ref 0.5–2.0)

## 2014-11-28 LAB — ETHANOL

## 2014-11-28 MED ORDER — MORPHINE SULFATE 4 MG/ML IJ SOLN
4.0000 mg | Freq: Once | INTRAMUSCULAR | Status: AC
  Administered 2014-11-28: 4 mg via INTRAVENOUS
  Filled 2014-11-28: qty 1

## 2014-11-28 MED ORDER — PHENAZOPYRIDINE HCL 200 MG PO TABS: 200.0000 mg | ORAL_TABLET | Freq: Three times a day (TID) | ORAL | Status: DC

## 2014-11-28 MED ORDER — ACETAMINOPHEN 325 MG PO TABS: 650.0000 mg | ORAL_TABLET | ORAL | Status: DC | PRN

## 2014-11-28 MED ORDER — SODIUM CHLORIDE 0.9 % IV SOLN
1000.0000 mL | Freq: Once | INTRAVENOUS | Status: AC
  Administered 2014-11-28: 1000 mL via INTRAVENOUS

## 2014-11-28 MED ORDER — SERTRALINE HCL 50 MG PO TABS
100.0000 mg | ORAL_TABLET | Freq: Every day | ORAL | Status: DC
  Administered 2014-11-29 – 2014-12-01 (×3): 100 mg via ORAL
  Filled 2014-11-28 (×3): qty 2

## 2014-11-28 MED ORDER — DOXYCYCLINE HYCLATE 100 MG PO TABS
100.0000 mg | ORAL_TABLET | Freq: Once | ORAL | Status: AC
  Administered 2014-11-28: 100 mg via ORAL
  Filled 2014-11-28: qty 1

## 2014-11-28 MED ORDER — DOXYCYCLINE HYCLATE 100 MG PO CAPS: 100.0000 mg | ORAL_CAPSULE | Freq: Two times a day (BID) | ORAL | Status: DC

## 2014-11-28 MED ORDER — TRAZODONE HCL 100 MG PO TABS
100.0000 mg | ORAL_TABLET | Freq: Every evening | ORAL | Status: DC | PRN
  Administered 2014-11-29: 100 mg via ORAL
  Filled 2014-11-28: qty 1

## 2014-11-28 MED ORDER — CLONAZEPAM 1 MG PO TABS
1.0000 mg | ORAL_TABLET | Freq: Three times a day (TID) | ORAL | Status: DC
  Administered 2014-11-28: 1 mg via ORAL
  Filled 2014-11-28: qty 1

## 2014-11-28 MED ORDER — ONDANSETRON HCL 4 MG PO TABS: 4.0000 mg | ORAL_TABLET | Freq: Three times a day (TID) | ORAL | Status: DC | PRN

## 2014-11-28 MED ORDER — DOXYCYCLINE HYCLATE 100 MG PO TABS
100.0000 mg | ORAL_TABLET | Freq: Two times a day (BID) | ORAL | Status: DC
  Administered 2014-11-29 – 2014-12-01 (×6): 100 mg via ORAL
  Filled 2014-11-28 (×6): qty 1

## 2014-11-28 MED ORDER — ZOLPIDEM TARTRATE 5 MG PO TABS
5.0000 mg | ORAL_TABLET | Freq: Every evening | ORAL | Status: DC | PRN
  Administered 2014-11-28: 5 mg via ORAL
  Filled 2014-11-28: qty 1

## 2014-11-28 MED ORDER — PANTOPRAZOLE SODIUM 40 MG PO TBEC
80.0000 mg | DELAYED_RELEASE_TABLET | Freq: Every day | ORAL | Status: DC
  Administered 2014-11-29 – 2014-12-01 (×3): 80 mg via ORAL
  Filled 2014-11-28 (×3): qty 2

## 2014-11-28 MED ORDER — NICOTINE 21 MG/24HR TD PT24
21.0000 mg | MEDICATED_PATCH | Freq: Every day | TRANSDERMAL | Status: DC
  Administered 2014-11-28 – 2014-12-01 (×4): 21 mg via TRANSDERMAL
  Filled 2014-11-28 (×4): qty 1

## 2014-11-28 MED ORDER — OXYCODONE-ACETAMINOPHEN 5-325 MG PO TABS: 1.0000 | ORAL_TABLET | ORAL | Status: DC | PRN

## 2014-11-28 MED ORDER — SODIUM CHLORIDE 0.9 % IV SOLN
1000.0000 mL | INTRAVENOUS | Status: DC
  Administered 2014-11-28: 1000 mL via INTRAVENOUS

## 2014-11-28 MED ORDER — ALUM & MAG HYDROXIDE-SIMETH 200-200-20 MG/5ML PO SUSP: 30.0000 mL | ORAL | Status: DC | PRN

## 2014-11-28 MED ORDER — LORAZEPAM 1 MG PO TABS
1.0000 mg | ORAL_TABLET | Freq: Three times a day (TID) | ORAL | Status: DC | PRN
  Administered 2014-11-28 – 2014-11-29 (×3): 1 mg via ORAL
  Filled 2014-11-28 (×3): qty 1

## 2014-11-28 MED ORDER — IBUPROFEN 200 MG PO TABS
600.0000 mg | ORAL_TABLET | Freq: Three times a day (TID) | ORAL | Status: DC | PRN
  Administered 2014-11-30 – 2014-12-01 (×2): 600 mg via ORAL
  Filled 2014-11-28 (×2): qty 3

## 2014-11-28 MED ORDER — KETOROLAC TROMETHAMINE 30 MG/ML IJ SOLN
30.0000 mg | Freq: Once | INTRAMUSCULAR | Status: AC
  Administered 2014-11-28: 30 mg via INTRAVENOUS
  Filled 2014-11-28: qty 1

## 2014-11-28 MED ORDER — ONDANSETRON HCL 4 MG/2ML IJ SOLN
4.0000 mg | Freq: Once | INTRAMUSCULAR | Status: AC
  Administered 2014-11-28: 4 mg via INTRAVENOUS
  Filled 2014-11-28: qty 2
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
## 2014-11-28 NOTE — Discharge Instructions (Signed)
Your test did not show a clear cause for your pain. There is no sign that the stones in either kidney are causing problems, and no evidence of infection. You're being treated for a possible infection that does not show up on a routine urine sample. Please take the antibiotic as directed. Return if pain is not being controlled or if you start running a fever. Otherwise, see your primary care provider in the next several days for further evaluation.  Flank Pain Flank pain refers to pain that is located on the side of the body between the upper abdomen and the back. The pain may occur over a short period of time (acute) or may be long-term or reoccurring (chronic). It may be mild or severe. Flank pain can be caused by many things. CAUSES  Some of the more common causes of flank pain include:  Muscle strains.   Muscle spasms.   A disease of your spine (vertebral disk disease).   A lung infection (pneumonia).   Fluid around your lungs (pulmonary edema).   A kidney infection.   Kidney stones.   A very painful skin rash caused by the chickenpox virus (shingles).   Gallbladder disease.  [place] care will depend on the cause of your pain. In general,  Rest as directed by your caregiver.  Drink enough fluids to keep your urine clear or pale yellow.  Only take over-the-counter or prescription medicines as directed by your caregiver. Some medicines may help relieve the pain.  Tell your caregiver about any changes in your pain.  Follow up with your caregiver as directed. SEEK IMMEDIATE MEDICAL CARE IF:   Your pain is not controlled with medicine.   You have new or worsening symptoms.  Your pain increases.   You have abdominal pain.   You have shortness of breath.   You have persistent nausea or vomiting.   You have swelling in your abdomen.   You feel faint or pass out.   You have blood in your urine.  You have a fever or persistent  symptoms for more than 2-3 days.  You have a fever and your symptoms suddenly get worse. MAKE SURE YOU:   Understand these instructions.  Will watch your condition.  Will get help right away if you are not doing well or get worse. Document Released: 08/26/2005 Document Revised: 03/29/2012 Document Reviewed: 02/17/2012 [facility] Patient Information 2015 [place], [place]. This information is not intended to replace advice given to you by your health care provider. Make sure you discuss any questions you have with your health care provider. Doxycycline tablets or capsules What is this medicine? DOXYCYCLINE (dox i SYE kleen) is a tetracycline antibiotic. It kills certain bacteria or stops their growth. It is used to treat many kinds of infections, like dental, skin, respiratory, and urinary tract infections. It also treats acne, Lyme disease, malaria, and certain sexually transmitted infections. This medicine may be used for other purposes; ask your health care provider or pharmacist if you have questions. COMMON BRAND NAME(S): Acticlate, Adoxa, Adoxa CK, Adoxa Pak, Adoxa TT, Alodox, Avidoxy, Doxal, Monodox, Morgidox 1x, Morgidox 1x Kit, Morgidox 2x, Morgidox 2x Kit, Ocudox, Vibra-Tabs, Vibramycin What should I tell my health care provider before I take this medicine? They need to know if you have any of these conditions: -liver disease -long exposure to sunlight like working outdoors -stomach problems like colitis -an unusual or allergic reaction to doxycycline, tetracycline antibiotics, other medicines, foods, dyes, or preservatives -pregnant  or trying to get pregnant -breast-feeding How should I use this medicine? Take this medicine by mouth with a full glass of water. Follow the directions on the prescription label. It is best to take this medicine without food, but if it upsets your stomach take it with food. Take your medicine at regular intervals. Do not take your medicine more often than  directed. Take all of your medicine as directed even if you think you are better. Do not skip doses or stop your medicine early. Talk to your pediatrician regarding the use of this medicine in children. Special care may be needed. While this drug may be prescribed for children as young as 34 years old for selected conditions, precautions do apply. Overdosage: If you think you have taken too much of this medicine contact a poison control center or emergency room at once. NOTE: This medicine is only for you. Do not share this medicine with others. What if I miss a dose? If you miss a dose, take it as soon as you can. If it is almost time for your next dose, take only that dose. Do not take double or extra doses. What may interact with this medicine? -antacids -barbiturates -birth control pills -bismuth subsalicylate -carbamazepine -methoxyflurane -other antibiotics -phenytoin -vitamins that contain iron -warfarin This list may not describe all possible interactions. Give your health care provider a list of all the medicines, herbs, non-prescription drugs, or dietary supplements you use. Also tell them if you smoke, drink alcohol, or use illegal drugs. Some items may interact with your medicine. What should I watch for while using this medicine? Tell your doctor or health care professional if your symptoms do not improve. Do not treat diarrhea with over the counter products. Contact your doctor if you have diarrhea that lasts more than 2 days or if it is severe and watery. Do not take this medicine just before going to bed. It may not dissolve properly when you lay down and can cause pain in your throat. Drink plenty of fluids while taking this medicine to also help reduce irritation in your throat. This medicine can make you more sensitive to the sun. Keep out of the sun. If you cannot avoid being in the sun, wear protective clothing and use sunscreen. Do not use sun lamps or tanning  beds/booths. Birth control pills may not work properly while you are taking this medicine. Talk to your doctor about using an extra method of birth control. If you are being treated for a sexually transmitted infection, avoid sexual contact until you have finished your treatment. Your sexual partner may also need treatment. Avoid antacids, aluminum, calcium, magnesium, and iron products for 4 hours before and 2 hours after taking a dose of this medicine. If you are using this medicine to prevent malaria, you should still protect yourself from contact with mosquitos. Stay in screened-in areas, use mosquito nets, keep your body covered, and use an insect repellent. What side effects may I notice from receiving this medicine? Side effects that you should report to your doctor or health care professional as soon as possible: -allergic reactions like skin rash, itching or hives, swelling of the face, lips, or tongue -difficulty breathing -fever -itching in the rectal or genital area -pain on swallowing -redness, blistering, peeling or loosening of the skin, including inside the mouth -severe stomach pain or cramps -unusual bleeding or bruising -unusually weak or tired -yellowing of the eyes or skin Side effects that usually do not require medical  attention (report to your doctor or health care professional if they continue or are bothersome): -diarrhea -loss of appetite -nausea, vomiting This list may not describe all possible side effects. Call your doctor for medical advice about side effects. You may report side effects to FDA at 1-800-FDA-1088. Where should I keep my medicine? Keep out of the reach of children. Store at room temperature, below 30 degrees C (86 degrees F). Protect from light. Keep container tightly closed. Throw away any unused medicine after the expiration date. Taking this medicine after the expiration date can make you seriously ill. NOTE: This sheet is a summary. It may  not cover all possible information. If you have questions about this medicine, talk to your doctor, pharmacist, or health care provider.  2015, [employer]vier/[employer]. (2013-05-11 13:58:06)  Acetaminophen; Oxycodone tablets What is this medicine? ACETAMINOPHEN; OXYCODONE (a set a MEE noe fen; ox i KOE done) is a pain reliever. It is used to treat mild to moderate pain. This medicine may be used for other purposes; ask your health care provider or pharmacist if you have questions. COMMON BRAND NAME(S): Endocet, Magnacet, Narvox, Percocet, Perloxx, Primalev, Primlev, Roxicet, Xolox What should I tell my health care provider before I take this medicine? They need to know if you have any of these conditions: -brain tumor -Crohn's disease, inflammatory bowel disease, or ulcerative colitis -drug abuse or addiction -head injury -heart or circulation problems -if you often drink alcohol -kidney disease or problems going to the bathroom -liver disease -lung disease, asthma, or breathing problems -an unusual or allergic reaction to acetaminophen, oxycodone, other opioid analgesics, other medicines, foods, dyes, or preservatives -pregnant or trying to get pregnant -breast-feeding How should I use this medicine? Take this medicine by mouth with a full glass of water. Follow the directions on the prescription label. Take your medicine at regular intervals. Do not take your medicine more often than directed. Talk to your pediatrician regarding the use of this medicine in children. Special care may be needed. Patients over 65 years old may have a stronger reaction and need a smaller dose. Overdosage: If you think you have taken too much of this medicine contact a poison control center or emergency room at once. NOTE: This medicine is only for you. Do not share this medicine with others. What if I miss a dose? If you miss a dose, take it as soon as you can. If it is almost time for your next dose, take  only that dose. Do not take double or extra doses. What may interact with this medicine? -alcohol -antihistamines -barbiturates like amobarbital, butalbital, butabarbital, methohexital, pentobarbital, phenobarbital, thiopental, and secobarbital -benztropine -drugs for bladder problems like solifenacin, trospium, oxybutynin, tolterodine, hyoscyamine, and methscopolamine -drugs for breathing problems like ipratropium and tiotropium -drugs for certain stomach or intestine problems like propantheline, homatropine methylbromide, glycopyrrolate, atropine, belladonna, and dicyclomine -general anesthetics like etomidate, ketamine, nitrous oxide, propofol, desflurane, enflurane, halothane, isoflurane, and sevoflurane -medicines for depression, anxiety, or psychotic disturbances -medicines for sleep -muscle relaxants -naltrexone -narcotic medicines (opiates) for pain -phenothiazines like perphenazine, thioridazine, chlorpromazine, mesoridazine, fluphenazine, prochlorperazine, promazine, and trifluoperazine -scopolamine -tramadol -trihexyphenidyl This list may not describe all possible interactions. Give your health care provider a list of all the medicines, herbs, non-prescription drugs, or dietary supplements you use. Also tell them if you smoke, drink alcohol, or use illegal drugs. Some items may interact with your medicine. What should I watch for while using this medicine? Tell your doctor or health care professional if  your pain does not go away, if it gets worse, or if you have new or a different type of pain. You may develop tolerance to the medicine. Tolerance means that you will need a higher dose of the medication for pain relief. Tolerance is normal and is expected if you take this medicine for a long time. Do not suddenly stop taking your medicine because you may develop a severe reaction. Your body becomes used to the medicine. This does NOT mean you are addicted. Addiction is a behavior  related to getting and using a drug for a non-medical reason. If you have pain, you have a medical reason to take pain medicine. Your doctor will tell you how much medicine to take. If your doctor wants you to stop the medicine, the dose will be slowly lowered over time to avoid any side effects. You may get drowsy or dizzy. Do not drive, use machinery, or do anything that needs mental alertness until you know how this medicine affects you. Do not stand or sit up quickly, especially if you are an older patient. This reduces the risk of dizzy or fainting spells. Alcohol may interfere with the effect of this medicine. Avoid alcoholic drinks. There are different types of narcotic medicines (opiates) for pain. If you take more than one type at the same time, you may have more side effects. Give your health care provider a list of all medicines you use. Your doctor will tell you how much medicine to take. Do not take more medicine than directed. Call emergency for help if you have problems breathing. The medicine will cause constipation. Try to have a bowel movement at least every 2 to 3 days. If you do not have a bowel movement for 3 days, call your doctor or health care professional. Do not take Tylenol (acetaminophen) or medicines that have acetaminophen with this medicine. Too much acetaminophen can be very dangerous. Many nonprescription medicines contain acetaminophen. Always read the labels carefully to avoid taking more acetaminophen. What side effects may I notice from receiving this medicine? Side effects that you should report to your doctor or health care professional as soon as possible: -allergic reactions like skin rash, itching or hives, swelling of the face, lips, or tongue -breathing difficulties, wheezing -confusion -light headedness or fainting spells -severe stomach pain -unusually weak or tired -yellowing of the skin or the whites of the eyes Side effects that usually do not require  medical attention (report to your doctor or health care professional if they continue or are bothersome): -dizziness -drowsiness -nausea -vomiting This list may not describe all possible side effects. Call your doctor for medical advice about side effects. You may report side effects to FDA at 1-800-FDA-1088. Where should I keep my medicine? Keep out of the reach of children. This medicine can be abused. Keep your medicine in a safe place to protect it from theft. Do not share this medicine with anyone. Selling or giving away this medicine is dangerous and against the law. Store at room temperature between 20 and 25 degrees C (68 and 77 degrees F). Keep container tightly closed. Protect from light. This medicine may cause accidental overdose and death if it is taken by other adults, children, or pets. Flush any unused medicine down the toilet to reduce the chance of harm. Do not use the medicine after the expiration date. NOTE: This sheet is a summary. It may not cover all possible information. If you have questions about this medicine, talk to  your doctor, pharmacist, or health care provider.  2015, [employer] Standard. (2013-02-26 13:17:35)  Phenazopyridine tablets What is this medicine? PHENAZOPYRIDINE (fen az oh PEER i deen) is a pain reliever. It is used to stop the pain, burning, or discomfort caused by infection or irritation of the urinary tract. This medicine is not an antibiotic. It will not cure a urinary tract infection. This medicine may be used for other purposes; ask your health care provider or pharmacist if you have questions. COMMON BRAND NAME(S): AZO, Azo-100, Azo-Gesic, Azo-Septic, Azo-Standard, Phenazo, Prodium, Pyridium, Urinary Analgesic, Uristat What should I tell my health care provider before I take this medicine? They need to know if you have any of these conditions: -glucose-6-phosphate dehydrogenase (G6PD) deficiency -kidney disease -an unusual or allergic  reaction to phenazopyridine, other medicines, foods, dyes, or preservatives -pregnant or trying to get pregnant -breast-feeding How should I use this medicine? Take this medicine by mouth with a glass of water. Follow the directions on the prescription label. Take after meals. Take your doses at regular intervals. Do not take your medicine more often than directed. Do not skip doses or stop your medicine early even if you feel better. Do not stop taking except on your doctor's advice. Talk to your pediatrician regarding the use of this medicine in children. Special care may be needed. Overdosage: If you think you have taken too much of this medicine contact a poison control center or emergency room at once. NOTE: This medicine is only for you. Do not share this medicine with others. What if I miss a dose? If you miss a dose, take it as soon as you can. If it is almost time for your next dose, take only that dose. Do not take double or extra doses. What may interact with this medicine? Interactions are not expected. This list may not describe all possible interactions. Give your health care provider a list of all the medicines, herbs, non-prescription drugs, or dietary supplements you use. Also tell them if you smoke, drink alcohol, or use illegal drugs. Some items may interact with your medicine. What should I watch for while using this medicine? Tell your doctor or health care professional if your symptoms do not improve or if they get worse. This medicine colors body fluids red. This effect is harmless and will go away after you are done taking the medicine. It will change urine to an dark orange or red color. The red color may stain clothing. Soft contact lenses may become permanently stained. It is best not to wear soft contact lenses while taking this medicine. If you are diabetic you may get a false positive result for sugar in your urine. Talk to your health care provider. What side effects  may I notice from receiving this medicine? Side effects that you should report to your doctor or health care professional as soon as possible: -allergic reactions like skin rash, itching or hives, swelling of the face, lips, or tongue -blue or purple color of the skin -difficulty breathing -fever -less urine -unusual bleeding, bruising -unusual tired, weak -vomiting -yellowing of the eyes or skin Side effects that usually do not require medical attention (report to your doctor or health care professional if they continue or are bothersome): -dark urine -headache -stomach upset This list may not describe all possible side effects. Call your doctor for medical advice about side effects. You may report side effects to FDA at 1-800-FDA-1088. Where should I keep my medicine? Keep out of the reach  of children. Store at room temperature between 15 and 30 degrees C (59 and 86 degrees F). Protect from light and moisture. Throw away any unused medicine after the expiration date. NOTE: This sheet is a summary. It may not cover all possible information. If you have questions about this medicine, talk to your doctor, pharmacist, or health care provider.  2015, [employer]vier/[employer] Standard. (2008-02-01 11:04:07)

## 2014-11-28 NOTE — ED Notes (Signed)
Attempted to call report. RNs not available at this time
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
## 2014-11-28 NOTE — ED Notes (Signed)
Pt returned from US, monitor reapplied.
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
## 2014-11-28 NOTE — ED Notes (Signed)
The pt is c/o rt flank pain with chills  Rt groin painful urination urinary frequency.  She has had these symptoms for 1-2 weeks.  She was seen and treated here for the same.  5 days ago the pain increased.  C/o v and diarrhea

## 2014-11-28 NOTE — ED Notes (Signed)
Report given to [place]
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
## 2014-11-29 DIAGNOSIS — F332 Major depressive disorder, recurrent severe without psychotic features: Secondary | ICD-10-CM | POA: Diagnosis not present

## 2014-11-29 DIAGNOSIS — R45851 Suicidal ideations: Secondary | ICD-10-CM | POA: Diagnosis not present

## 2014-11-29 LAB — I-STAT CHEM 8, ED
BUN: 4 mg/dL — ABNORMAL LOW (ref 6–20)
CREATININE: 0.5 mg/dL (ref 0.44–1.00)
Calcium, Ion: 1.17 mmol/L (ref 1.12–1.23)
Chloride: 107 mmol/L (ref 101–111)
GLUCOSE: 95 mg/dL (ref 65–99)
HEMATOCRIT: 41 % (ref 36.0–46.0)
Hemoglobin: 13.9 g/dL (ref 12.0–15.0)
Potassium: 3.8 mmol/L (ref 3.5–5.1)
SODIUM: 142 mmol/L (ref 135–145)
TCO2: 18 mmol/L (ref 0–100)

## 2014-11-29 MED ORDER — HYDROXYZINE HCL 25 MG PO TABS
50.0000 mg | ORAL_TABLET | Freq: Four times a day (QID) | ORAL | Status: DC | PRN
  Administered 2014-11-29 – 2014-11-30 (×2): 50 mg via ORAL
  Filled 2014-11-29 (×2): qty 2

## 2014-11-29 MED ORDER — CLONAZEPAM 0.5 MG PO TABS
0.5000 mg | ORAL_TABLET | Freq: Every day | ORAL | Status: DC
  Administered 2014-11-30 – 2014-12-01 (×2): 0.5 mg via ORAL
  Filled 2014-11-29 (×2): qty 1
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
## 2014-11-29 NOTE — ED Notes (Signed)
Pt AAO x 3, no distress noted, watching TV at present, cam & cooperative, monitoring for safety, Q 15 min checks in effect.

## 2014-11-29 NOTE — ED Provider Notes (Signed)
Called by Psych staff to evaluate pt for bilat pedal edema. Pt denies any complaints other than "my feet are swollen." No rash. No open wounds. No calf edema/tenderness. NMS intact bilat feet with strong pedal pulses. NT feet/ankles/knees. I-stat chem reassuring. Doubt DVT with low risk Wells. Elevate, compression hose, f/u PMD.   Audrey JesterKathleen Triniti Gruetzmacher, DO 11/29/14 1159

## 2014-11-29 NOTE — BH Assessment (Signed)
[facility] Assessment Progress Note  The following facilities were contacted in an attempt to place the pt:  Beds available, referral faxed: [facility] (wait list) [facility] (wait list) [facility]  Left message, no answer: [facility]  At capacity: [facility]  TTS will continue to seek placement for the pt.  Kristen Margaretann Abate, MS, LPC Therapeutic Triage Specialist [facility]       

## 2014-11-30 ENCOUNTER — Inpatient Hospital Stay (HOSPITAL_COMMUNITY): Admission: AD | Admit: 2014-11-30 | Payer: Medicaid Other | Source: Intra-hospital | Admitting: Psychiatry

## 2014-11-30 DIAGNOSIS — F332 Major depressive disorder, recurrent severe without psychotic features: Secondary | ICD-10-CM | POA: Diagnosis not present

## 2014-11-30 MED ORDER — BUSPIRONE HCL 10 MG PO TABS
10.0000 mg | ORAL_TABLET | Freq: Two times a day (BID) | ORAL | Status: DC
  Administered 2014-11-30 – 2014-12-01 (×2): 10 mg via ORAL
  Filled 2014-11-30 (×3): qty 1

## 2014-11-30 NOTE — ED Notes (Signed)
Up to the bathroom 

## 2014-11-30 NOTE — ED Notes (Signed)
Pt. Noted sleeping in room. No complaints or concerns voiced. No distress or abnormal behavior noted. Will continue to monitor with security cameras. Q 15 minute rounds continue. 

## 2014-11-30 NOTE — BH Assessment (Signed)
[facility] Accepted to [phone] per Inetta Fermoina after 9pm.

## 2014-11-30 NOTE — ED Notes (Signed)
Pt. Noted in room. No complaints or concerns voiced. No distress or abnormal behavior noted. Will continue to monitor with security cameras. Q 15 minute rounds continue. 

## 2014-11-30 NOTE — ED Notes (Signed)
Report received from Janie Rambo RN. Pt. Alert and oriented in no distress denies SI, HI, AVH and pain.  Pt. Instructed to come to me with problems or concerns.Will continue to monitor for safety via security cameras and Q 15 minute checks. 
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
## 2014-12-01 DIAGNOSIS — F332 Major depressive disorder, recurrent severe without psychotic features: Secondary | ICD-10-CM | POA: Diagnosis not present

## 2014-12-01 MED ORDER — TRAZODONE HCL 100 MG PO TABS: 100.0000 mg | ORAL_TABLET | Freq: Every evening | ORAL | Status: DC | PRN

## 2014-12-01 MED ORDER — SERTRALINE HCL 100 MG PO TABS: 100.0000 mg | ORAL_TABLET | Freq: Every day | ORAL | Status: DC

## 2014-12-01 MED ORDER — DOXYCYCLINE HYCLATE 100 MG PO TABS: 100.0000 mg | ORAL_TABLET | Freq: Two times a day (BID) | ORAL | Status: DC

## 2014-12-01 MED ORDER — CLONAZEPAM 0.5 MG PO TABS: 0.5000 mg | ORAL_TABLET | Freq: Two times a day (BID) | ORAL | Status: DC | PRN

## 2014-12-01 MED ORDER — PANTOPRAZOLE SODIUM 40 MG PO TBEC: 80.0000 mg | DELAYED_RELEASE_TABLET | Freq: Every day | ORAL | Status: DC

## 2014-12-01 NOTE — ED Notes (Signed)
Up to the bathroom 

## 2014-12-01 NOTE — ED Notes (Signed)
Pt. Noted sleeping in room. No complaints or concerns voiced. No distress or abnormal behavior noted. Will continue to monitor with security cameras. Q 15 minute rounds continue. 

## 2014-12-01 NOTE — ED Notes (Addendum)
Written dc instructions and prescriptions reviewed w/ patient. Pt encouraged to take medications as directed and follow up with her primary providers as soon as possible.  Pt verbalized understanding. Pt had been given housing resouces.  Pt ambulatory w/o difficulty to dc window w/ [employer] officer, belongings returned when leaving.
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
## 2014-12-01 NOTE — [facility] Suicide Risk Assessment (Cosign Needed)
Suicide Risk Assessment  Discharge Assessment   [facility]   Demographic Factors:  Divorced or widowed, Low socioeconomic status and Living alone  Total Time spent with patient: 20 minutes  Musculoskeletal: Strength & Muscle Tone: within normal limits Gait & Station: normal Patient leans: N/A  Psychiatric Specialty Exam:     Blood pressure 110/64, pulse 86, temperature 98.6 F (37 C), temperature source Oral, resp. rate 18, last menstrual period 11/06/2014, SpO2 97 %.There is no weight on file to calculate BMI.  General Appearance: Casual and Fairly Groomed  Eye Contact::  Good  Speech:  Clear and Coherent and Normal Rate409  Volume:  Normal  Mood:  Anxious and Depressed  Affect:  Congruent and Depressed  Thought Process:  Coherent, Goal Directed and Intact  Orientation:  Full (Time, Place, and Person)  Thought Content:  WDL  Suicidal Thoughts:  No  Homicidal Thoughts:  No  Memory:  Immediate;   Good Recent;   Good Remote;   Good  Judgement:  Good  Insight:  Good  Psychomotor Activity:  Normal  Concentration:  Good  Recall:  NA  Fund of Knowledge:Good  Language: Good  Akathisia:  NA  Handed:  Right  AIMS (if indicated):     Assets:  Desire for Improvement  Sleep:     Cognition: WNL  ADL's:  Intact      Has this patient used any form of tobacco in the last 30 days? (Cigarettes, Smokeless Tobacco, Cigars, and/or Pipes) Yes, A prescription for an FDA-approved tobacco cessation medication was offered at discharge and the patient refused  Mental Status Per Nursing Assessment::   On Admission:     Current Mental Status by Physician: NA  Loss Factors: NA  Historical Factors: Prior suicide attempts  Risk Reduction Factors:   Positive therapeutic relationship  Continued Clinical Symptoms:  Bipolar Disorder:   Depressive phase Depression:   Insomnia  Cognitive Features That Contribute To Risk:  Polarized thinking    Suicide  Risk:  Minimal: No identifiable suicidal ideation.  Patients presenting with no risk factors but with morbid ruminations; may be classified as minimal risk based on the severity of the depressive symptoms  Principal Problem: Severe recurrent major depression without psychotic features Discharge Diagnoses:  Patient Active Problem List   Diagnosis Date Noted  . Severe recurrent major depression without psychotic features [F33.2] 01/10/2014    Priority: High  . Suicidal ideations [R45.851]   . Anxiety [F41.9]   . Suicidal behavior [F48.9]   . Recurrent major depression-severe [F33.2] 04/08/2014  . Suicidal thoughts [R45.851] 04/07/2014  . GERD (gastroesophageal reflux disease) [K21.9] 03/18/2014  . Vaginal discharge [N89.8] 03/18/2014  . Tobacco abuse [Z72.0] 03/18/2014  . Anxiety state, unspecified [F41.1] 03/18/2014  . Colitis [K52.9] 03/16/2014  . Panic attacks [F41.0] 01/11/2014  . GAD (generalized anxiety disorder) [F41.1] 01/11/2014  . Pyelonephritis [N12] 01/03/2014  . Hypotension [I95.9] 12/31/2013  . UTI (urinary tract infection) [N39.0] 12/31/2013  . Chronic pelvic pain in female [N94.9, G89.29] 03/15/2011      Plan Of Care/Follow-up recommendations:  Activity:  as tolerated Diet:  regular  Is patient on multiple antipsychotic therapies at discharge:  No   Has Patient had three or more failed trials of antipsychotic monotherapy by history:  No  Recommended Plan for Multiple Antipsychotic Therapies: NA    Dahlia ByesONUOHA, Foday Cone C  PMHNP-BC 12/01/2014, 4:35 PM

## 2014-12-01 NOTE — [facility] Counselor (Signed)
Given homeless shelter information for follow up and bus passes for discharge.   Kateri PlummerKristin Verenice Westrich, M.S., LPCA, [place]AS[place], [facility] Licensed Professional Counselor Associate  Triage Specialist  [facility]  Therapeutic Triage Services Phone: [phone] Fax: [phone]

## 2014-12-02 ENCOUNTER — Emergency Department (HOSPITAL_COMMUNITY): Payer: Medicaid Other

## 2014-12-02 ENCOUNTER — Encounter (HOSPITAL_COMMUNITY): Payer: Self-pay | Admitting: Emergency Medicine

## 2014-12-02 ENCOUNTER — Inpatient Hospital Stay (HOSPITAL_COMMUNITY)
Admission: EM | Admit: 2014-12-02 | Discharge: 2014-12-04 | DRG: 206 | Disposition: A | Discharge status: Home / Self Care | Payer: Medicaid Other | Attending: Internal Medicine | Admitting: Internal Medicine

## 2014-12-02 DIAGNOSIS — F17210 Nicotine dependence, cigarettes, uncomplicated: Secondary | ICD-10-CM | POA: Diagnosis present

## 2014-12-02 DIAGNOSIS — F410 Panic disorder [episodic paroxysmal anxiety] without agoraphobia: Secondary | ICD-10-CM | POA: Diagnosis present

## 2014-12-02 DIAGNOSIS — G8929 Other chronic pain: Secondary | ICD-10-CM | POA: Diagnosis present

## 2014-12-02 DIAGNOSIS — Z716 Tobacco abuse counseling: Secondary | ICD-10-CM | POA: Diagnosis present

## 2014-12-02 DIAGNOSIS — Z9049 Acquired absence of other specified parts of digestive tract: Secondary | ICD-10-CM | POA: Diagnosis present

## 2014-12-02 DIAGNOSIS — E669 Obesity, unspecified: Secondary | ICD-10-CM | POA: Diagnosis present

## 2014-12-02 DIAGNOSIS — Z6836 Body mass index (BMI) 36.0-36.9, adult: Secondary | ICD-10-CM

## 2014-12-02 DIAGNOSIS — K219 Gastro-esophageal reflux disease without esophagitis: Secondary | ICD-10-CM | POA: Diagnosis present

## 2014-12-02 DIAGNOSIS — M940 Chondrocostal junction syndrome [Tietze]: Principal | ICD-10-CM | POA: Diagnosis present

## 2014-12-02 DIAGNOSIS — F319 Bipolar disorder, unspecified: Secondary | ICD-10-CM | POA: Diagnosis present

## 2014-12-02 DIAGNOSIS — F411 Generalized anxiety disorder: Secondary | ICD-10-CM | POA: Diagnosis present

## 2014-12-02 DIAGNOSIS — F332 Major depressive disorder, recurrent severe without psychotic features: Secondary | ICD-10-CM | POA: Diagnosis present

## 2014-12-02 DIAGNOSIS — Z7289 Other problems related to lifestyle: Secondary | ICD-10-CM

## 2014-12-02 DIAGNOSIS — K529 Noninfective gastroenteritis and colitis, unspecified: Secondary | ICD-10-CM | POA: Diagnosis present

## 2014-12-02 DIAGNOSIS — Z888 Allergy status to other drugs, medicaments and biological substances status: Secondary | ICD-10-CM

## 2014-12-02 DIAGNOSIS — F1910 Other psychoactive substance abuse, uncomplicated: Secondary | ICD-10-CM | POA: Diagnosis present

## 2014-12-02 DIAGNOSIS — E876 Hypokalemia: Secondary | ICD-10-CM | POA: Diagnosis present

## 2014-12-02 DIAGNOSIS — Z720 Tobacco use: Secondary | ICD-10-CM | POA: Diagnosis present

## 2014-12-02 DIAGNOSIS — Z7982 Long term (current) use of aspirin: Secondary | ICD-10-CM

## 2014-12-02 DIAGNOSIS — E785 Hyperlipidemia, unspecified: Secondary | ICD-10-CM | POA: Diagnosis present

## 2014-12-02 DIAGNOSIS — F1410 Cocaine abuse, uncomplicated: Secondary | ICD-10-CM | POA: Diagnosis present

## 2014-12-02 DIAGNOSIS — R079 Chest pain, unspecified: Secondary | ICD-10-CM | POA: Diagnosis present

## 2014-12-02 HISTORY — DX: Noninfective gastroenteritis and colitis, unspecified: K52.9

## 2014-12-02 LAB — CBC WITH DIFFERENTIAL/PLATELET
Basophils Absolute: 0 10*3/uL (ref 0.0–0.1)
Basophils Relative: 0 % (ref 0–1)
Eosinophils Absolute: 0.1 10*3/uL (ref 0.0–0.7)
Eosinophils Relative: 2 % (ref 0–5)
HCT: 37.1 % (ref 36.0–46.0)
HEMOGLOBIN: 12.4 g/dL (ref 12.0–15.0)
LYMPHS ABS: 2.7 10*3/uL (ref 0.7–4.0)
Lymphocytes Relative: 37 % (ref 12–46)
MCH: 28.2 pg (ref 26.0–34.0)
MCHC: 33.4 g/dL (ref 30.0–36.0)
MCV: 84.5 fL (ref 78.0–100.0)
MONOS PCT: 7 % (ref 3–12)
Monocytes Absolute: 0.5 10*3/uL (ref 0.1–1.0)
Neutro Abs: 3.9 10*3/uL (ref 1.7–7.7)
Neutrophils Relative %: 54 % (ref 43–77)
PLATELETS: 326 10*3/uL (ref 150–400)
RBC: 4.39 MIL/uL (ref 3.87–5.11)
RDW: 13.2 % (ref 11.5–15.5)
WBC: 7.2 10*3/uL (ref 4.0–10.5)

## 2014-12-02 LAB — URINALYSIS, ROUTINE W REFLEX MICROSCOPIC
Bilirubin Urine: NEGATIVE
Glucose, UA: NEGATIVE mg/dL
Ketones, ur: NEGATIVE mg/dL
Leukocytes, UA: NEGATIVE
Nitrite: NEGATIVE
Protein, ur: NEGATIVE mg/dL
Specific Gravity, Urine: >1.03 — ABNORMAL HIGH (ref 1.005–1.030)
Urobilinogen, UA: 0.2 mg/dL (ref 0.0–1.0)
pH: 6 (ref 5.0–8.0)

## 2014-12-02 LAB — I-STAT TROPONIN, ED: Troponin i, poc: 0 ng/mL (ref 0.00–0.08)

## 2014-12-02 LAB — URINE MICROSCOPIC-ADD ON

## 2014-12-02 LAB — I-STAT CHEM 8, ED
BUN: 9 mg/dL (ref 6–20)
Calcium, Ion: 1.14 mmol/L (ref 1.12–1.23)
Chloride: 106 mmol/L (ref 101–111)
Creatinine, Ser: 0.5 mg/dL (ref 0.44–1.00)
Glucose, Bld: 130 mg/dL — ABNORMAL HIGH (ref 65–99)
HCT: 41 % (ref 36.0–46.0)
Hemoglobin: 13.9 g/dL (ref 12.0–15.0)
Potassium: 2.8 mmol/L — ABNORMAL LOW (ref 3.5–5.1)
Sodium: 144 mmol/L (ref 135–145)
TCO2: 19 mmol/L (ref 0–100)

## 2014-12-02 LAB — POC URINE PREG, ED: Preg Test, Ur: NEGATIVE

## 2014-12-02 MED ORDER — POTASSIUM CHLORIDE 10 MEQ/100ML IV SOLN
10.0000 meq | Freq: Once | INTRAVENOUS | Status: AC
  Administered 2014-12-03: 10 meq via INTRAVENOUS
  Filled 2014-12-02: qty 100

## 2014-12-02 MED ORDER — POTASSIUM CHLORIDE CRYS ER 20 MEQ PO TBCR
40.0000 meq | EXTENDED_RELEASE_TABLET | Freq: Once | ORAL | Status: AC
  Administered 2014-12-02: 40 meq via ORAL
  Filled 2014-12-02: qty 2

## 2014-12-02 MED ORDER — ASPIRIN 81 MG PO CHEW
324.0000 mg | CHEWABLE_TABLET | Freq: Once | ORAL | Status: AC
  Administered 2014-12-02: 324 mg via ORAL
  Filled 2014-12-02: qty 4

## 2014-12-02 MED ORDER — KETOROLAC TROMETHAMINE 30 MG/ML IJ SOLN
30.0000 mg | Freq: Once | INTRAMUSCULAR | Status: AC
  Administered 2014-12-03: 30 mg via INTRAVENOUS
  Filled 2014-12-02: qty 1
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
## 2014-12-02 NOTE — ED Notes (Signed)
Pt st's she started having left  Chest pain approx 1 pm today while at rest.  Pt also st's she feel short of breath.  St's has had nonproductive cough since last pm

## 2014-12-03 ENCOUNTER — Inpatient Hospital Stay (HOSPITAL_COMMUNITY): Payer: Medicaid Other

## 2014-12-03 ENCOUNTER — Encounter (HOSPITAL_COMMUNITY): Payer: Self-pay | Admitting: Internal Medicine

## 2014-12-03 DIAGNOSIS — F411 Generalized anxiety disorder: Secondary | ICD-10-CM

## 2014-12-03 DIAGNOSIS — Z716 Tobacco abuse counseling: Secondary | ICD-10-CM | POA: Diagnosis present

## 2014-12-03 DIAGNOSIS — R079 Chest pain, unspecified: Secondary | ICD-10-CM | POA: Diagnosis not present

## 2014-12-03 DIAGNOSIS — F319 Bipolar disorder, unspecified: Secondary | ICD-10-CM | POA: Diagnosis present

## 2014-12-03 DIAGNOSIS — Z888 Allergy status to other drugs, medicaments and biological substances status: Secondary | ICD-10-CM | POA: Diagnosis not present

## 2014-12-03 DIAGNOSIS — E876 Hypokalemia: Secondary | ICD-10-CM | POA: Diagnosis not present

## 2014-12-03 DIAGNOSIS — F332 Major depressive disorder, recurrent severe without psychotic features: Secondary | ICD-10-CM

## 2014-12-03 DIAGNOSIS — F1910 Other psychoactive substance abuse, uncomplicated: Secondary | ICD-10-CM | POA: Diagnosis present

## 2014-12-03 DIAGNOSIS — Z7982 Long term (current) use of aspirin: Secondary | ICD-10-CM | POA: Diagnosis not present

## 2014-12-03 DIAGNOSIS — K219 Gastro-esophageal reflux disease without esophagitis: Secondary | ICD-10-CM | POA: Diagnosis present

## 2014-12-03 DIAGNOSIS — M940 Chondrocostal junction syndrome [Tietze]: Secondary | ICD-10-CM | POA: Diagnosis present

## 2014-12-03 DIAGNOSIS — Z6836 Body mass index (BMI) 36.0-36.9, adult: Secondary | ICD-10-CM | POA: Diagnosis not present

## 2014-12-03 DIAGNOSIS — F333 Major depressive disorder, recurrent, severe with psychotic symptoms: Secondary | ICD-10-CM

## 2014-12-03 DIAGNOSIS — F1410 Cocaine abuse, uncomplicated: Secondary | ICD-10-CM | POA: Diagnosis present

## 2014-12-03 DIAGNOSIS — F410 Panic disorder [episodic paroxysmal anxiety] without agoraphobia: Secondary | ICD-10-CM | POA: Diagnosis present

## 2014-12-03 DIAGNOSIS — Z9049 Acquired absence of other specified parts of digestive tract: Secondary | ICD-10-CM | POA: Diagnosis present

## 2014-12-03 DIAGNOSIS — K529 Noninfective gastroenteritis and colitis, unspecified: Secondary | ICD-10-CM | POA: Diagnosis present

## 2014-12-03 DIAGNOSIS — Z720 Tobacco use: Secondary | ICD-10-CM

## 2014-12-03 DIAGNOSIS — G8929 Other chronic pain: Secondary | ICD-10-CM | POA: Diagnosis present

## 2014-12-03 DIAGNOSIS — E669 Obesity, unspecified: Secondary | ICD-10-CM | POA: Diagnosis present

## 2014-12-03 DIAGNOSIS — E785 Hyperlipidemia, unspecified: Secondary | ICD-10-CM | POA: Diagnosis present

## 2014-12-03 DIAGNOSIS — K210 Gastro-esophageal reflux disease with esophagitis: Secondary | ICD-10-CM

## 2014-12-03 DIAGNOSIS — F17210 Nicotine dependence, cigarettes, uncomplicated: Secondary | ICD-10-CM | POA: Diagnosis present

## 2014-12-03 DIAGNOSIS — Z7289 Other problems related to lifestyle: Secondary | ICD-10-CM | POA: Diagnosis not present

## 2014-12-03 LAB — COMPREHENSIVE METABOLIC PANEL
ALBUMIN: 2.8 g/dL — AB (ref 3.5–5.0)
ALK PHOS: 49 U/L (ref 38–126)
ALT: 53 U/L (ref 14–54)
ANION GAP: 7 (ref 5–15)
AST: 46 U/L — AB (ref 15–41)
BUN: 8 mg/dL (ref 6–20)
CHLORIDE: 112 mmol/L — AB (ref 101–111)
CO2: 20 mmol/L — ABNORMAL LOW (ref 22–32)
Calcium: 8 mg/dL — ABNORMAL LOW (ref 8.9–10.3)
Creatinine, Ser: 0.61 mg/dL (ref 0.44–1.00)
GFR calc Af Amer: >60 mL/min (ref >60)
GFR calc non Af Amer: >60 mL/min (ref >60)
Glucose, Bld: 103 mg/dL — ABNORMAL HIGH (ref 65–99)
POTASSIUM: 4.1 mmol/L (ref 3.5–5.1)
Sodium: 139 mmol/L (ref 135–145)
Total Bilirubin: 0.8 mg/dL (ref 0.3–1.2)
Total Protein: 5.8 g/dL — ABNORMAL LOW (ref 6.5–8.1)

## 2014-12-03 LAB — PROTIME-INR
INR: 1.14 (ref 0.00–1.49)
Prothrombin Time: 14.8 seconds (ref 11.6–15.2)

## 2014-12-03 LAB — LIPID PANEL
CHOL/HDL RATIO: 4.6 ratio
CHOLESTEROL: 124 mg/dL (ref 0–200)
HDL: 27 mg/dL — ABNORMAL LOW (ref >40)
LDL Cholesterol: 82 mg/dL (ref 0–99)
TRIGLYCERIDES: 77 mg/dL (ref <150)
VLDL: 15 mg/dL (ref 0–40)

## 2014-12-03 LAB — MRSA PCR SCREENING: MRSA BY PCR: POSITIVE — AB

## 2014-12-03 LAB — CBC
HCT: 32.2 % — ABNORMAL LOW (ref 36.0–46.0)
Hemoglobin: 10.9 g/dL — ABNORMAL LOW (ref 12.0–15.0)
MCH: 29.1 pg (ref 26.0–34.0)
MCHC: 33.9 g/dL (ref 30.0–36.0)
MCV: 85.9 fL (ref 78.0–100.0)
Platelets: 245 10*3/uL (ref 150–400)
RBC: 3.75 MIL/uL — ABNORMAL LOW (ref 3.87–5.11)
RDW: 13.5 % (ref 11.5–15.5)
WBC: 5.3 10*3/uL (ref 4.0–10.5)

## 2014-12-03 LAB — TROPONIN I: Troponin I: <0.03 ng/mL (ref <0.031)

## 2014-12-03 LAB — RAPID URINE DRUG SCREEN, HOSP PERFORMED
AMPHETAMINES: NOT DETECTED
BENZODIAZEPINES: POSITIVE — AB
Barbiturates: NOT DETECTED
Cocaine: NOT DETECTED
Opiates: POSITIVE — AB
TETRAHYDROCANNABINOL: NOT DETECTED

## 2014-12-03 LAB — HIV ANTIBODY (ROUTINE TESTING W REFLEX): HIV Screen 4th Generation wRfx: NONREACTIVE

## 2014-12-03 LAB — MAGNESIUM: Magnesium: 1.5 mg/dL — ABNORMAL LOW (ref 1.7–2.4)

## 2014-12-03 LAB — D-DIMER, QUANTITATIVE (NOT AT ARMC): D DIMER QUANT: 0.43 ug{FEU}/mL (ref 0.00–0.48)

## 2014-12-03 LAB — I-STAT TROPONIN, ED: TROPONIN I, POC: 0 ng/mL (ref 0.00–0.08)

## 2014-12-03 LAB — GLUCOSE, CAPILLARY: GLUCOSE-CAPILLARY: 93 mg/dL (ref 65–99)

## 2014-12-03 MED ORDER — SODIUM CHLORIDE 0.9 % IJ SOLN
3.0000 mL | Freq: Two times a day (BID) | INTRAMUSCULAR | Status: DC
  Administered 2014-12-03: 3 mL via INTRAVENOUS

## 2014-12-03 MED ORDER — TRAZODONE HCL 100 MG PO TABS
100.0000 mg | ORAL_TABLET | Freq: Every evening | ORAL | Status: DC | PRN
  Administered 2014-12-03: 100 mg via ORAL
  Filled 2014-12-03 (×2): qty 1

## 2014-12-03 MED ORDER — NITROGLYCERIN 0.4 MG SL SUBL: 0.4000 mg | SUBLINGUAL_TABLET | SUBLINGUAL | Status: DC | PRN

## 2014-12-03 MED ORDER — MUPIROCIN 2 % EX OINT
1.0000 "application " | TOPICAL_OINTMENT | Freq: Two times a day (BID) | CUTANEOUS | Status: DC
  Administered 2014-12-03 – 2014-12-04 (×3): 1 via NASAL
  Filled 2014-12-03: qty 22

## 2014-12-03 MED ORDER — ENSURE ENLIVE PO LIQD
237.0000 mL | Freq: Every day | ORAL | Status: DC
  Administered 2014-12-03: 237 mL via ORAL

## 2014-12-03 MED ORDER — MELATONIN 3 MG PO TABS
6.0000 mg | ORAL_TABLET | Freq: Every day | ORAL | Status: DC
  Administered 2014-12-03: 6 mg via ORAL
  Filled 2014-12-03 (×2): qty 2

## 2014-12-03 MED ORDER — PNEUMOCOCCAL VAC POLYVALENT 25 MCG/0.5ML IJ INJ
0.5000 mL | INJECTION | INTRAMUSCULAR | Status: DC
  Filled 2014-12-03: qty 0.5

## 2014-12-03 MED ORDER — NITROGLYCERIN 0.4 MG SL SUBL
0.4000 mg | SUBLINGUAL_TABLET | SUBLINGUAL | Status: DC | PRN
Start: 2014-12-03 — End: 2014-12-03
  Administered 2014-12-03: 0.4 mg via SUBLINGUAL

## 2014-12-03 MED ORDER — ONDANSETRON HCL 4 MG PO TABS: 4.0000 mg | ORAL_TABLET | Freq: Four times a day (QID) | ORAL | Status: DC | PRN

## 2014-12-03 MED ORDER — POTASSIUM CHLORIDE 10 MEQ/100ML IV SOLN
10.0000 meq | Freq: Once | INTRAVENOUS | Status: AC
  Administered 2014-12-03: 10 meq via INTRAVENOUS

## 2014-12-03 MED ORDER — ACETAMINOPHEN 325 MG PO TABS
650.0000 mg | ORAL_TABLET | Freq: Four times a day (QID) | ORAL | Status: DC | PRN
  Administered 2014-12-03: 650 mg via ORAL
  Filled 2014-12-03: qty 2

## 2014-12-03 MED ORDER — CHLORHEXIDINE GLUCONATE CLOTH 2 % EX PADS
6.0000 | MEDICATED_PAD | Freq: Every day | CUTANEOUS | Status: DC
  Administered 2014-12-03: 6 via TOPICAL

## 2014-12-03 MED ORDER — CLONAZEPAM 0.5 MG PO TABS
0.5000 mg | ORAL_TABLET | Freq: Two times a day (BID) | ORAL | Status: DC | PRN
  Administered 2014-12-03 – 2014-12-04 (×3): 0.5 mg via ORAL
  Filled 2014-12-03 (×3): qty 1

## 2014-12-03 MED ORDER — ALUM & MAG HYDROXIDE-SIMETH 200-200-20 MG/5ML PO SUSP: 30.0000 mL | Freq: Four times a day (QID) | ORAL | Status: DC | PRN

## 2014-12-03 MED ORDER — ALBUTEROL SULFATE (2.5 MG/3ML) 0.083% IN NEBU: 3.0000 mL | INHALATION_SOLUTION | RESPIRATORY_TRACT | Status: DC | PRN

## 2014-12-03 MED ORDER — SODIUM CHLORIDE 0.9 % IV SOLN
1000.0000 mL | Freq: Once | INTRAVENOUS | Status: AC
  Administered 2014-12-03: 1000 mL via INTRAVENOUS

## 2014-12-03 MED ORDER — SODIUM CHLORIDE 0.9 % IV SOLN
1000.0000 mL | INTRAVENOUS | Status: DC
  Administered 2014-12-03 (×2): 1000 mL via INTRAVENOUS

## 2014-12-03 MED ORDER — MORPHINE SULFATE 2 MG/ML IJ SOLN
2.0000 mg | INTRAMUSCULAR | Status: DC | PRN
  Administered 2014-12-03 (×2): 2 mg via INTRAVENOUS
  Filled 2014-12-03 (×2): qty 1

## 2014-12-03 MED ORDER — SODIUM CHLORIDE 0.9 % IV SOLN
1000.0000 mL | INTRAVENOUS | Status: DC
  Administered 2014-12-03: 1000 mL via INTRAVENOUS

## 2014-12-03 MED ORDER — SERTRALINE HCL 100 MG PO TABS
100.0000 mg | ORAL_TABLET | Freq: Every day | ORAL | Status: DC
  Administered 2014-12-03: 100 mg via ORAL
  Filled 2014-12-03: qty 1

## 2014-12-03 MED ORDER — HEPARIN SODIUM (PORCINE) 5000 UNIT/ML IJ SOLN
5000.0000 [IU] | Freq: Three times a day (TID) | INTRAMUSCULAR | Status: DC
  Administered 2014-12-03 – 2014-12-04 (×4): 5000 [IU] via SUBCUTANEOUS
  Filled 2014-12-03 (×6): qty 1

## 2014-12-03 MED ORDER — HYDROXYZINE HCL 50 MG PO TABS
50.0000 mg | ORAL_TABLET | Freq: Three times a day (TID) | ORAL | Status: DC | PRN
  Filled 2014-12-03: qty 1

## 2014-12-03 MED ORDER — ATORVASTATIN CALCIUM 40 MG PO TABS
40.0000 mg | ORAL_TABLET | Freq: Every day | ORAL | Status: DC
  Administered 2014-12-03: 40 mg via ORAL
  Filled 2014-12-03 (×2): qty 1

## 2014-12-03 MED ORDER — IBUPROFEN 400 MG PO TABS
400.0000 mg | ORAL_TABLET | Freq: Three times a day (TID) | ORAL | Status: DC
  Administered 2014-12-03 – 2014-12-04 (×3): 400 mg via ORAL
  Filled 2014-12-03: qty 1
  Filled 2014-12-03: qty 2
  Filled 2014-12-03 (×6): qty 1

## 2014-12-03 MED ORDER — SERTRALINE HCL 50 MG PO TABS
150.0000 mg | ORAL_TABLET | Freq: Every day | ORAL | Status: DC
  Administered 2014-12-04: 150 mg via ORAL
  Filled 2014-12-03: qty 1

## 2014-12-03 MED ORDER — MORPHINE SULFATE 4 MG/ML IJ SOLN
4.0000 mg | Freq: Once | INTRAMUSCULAR | Status: AC
  Administered 2014-12-03: 4 mg via INTRAVENOUS

## 2014-12-03 MED ORDER — NITROGLYCERIN 0.4 MG SL SUBL
SUBLINGUAL_TABLET | SUBLINGUAL | Status: AC
  Filled 2014-12-03: qty 1

## 2014-12-03 MED ORDER — ACETAMINOPHEN 650 MG RE SUPP: 650.0000 mg | Freq: Four times a day (QID) | RECTAL | Status: DC | PRN

## 2014-12-03 MED ORDER — ONDANSETRON HCL 4 MG/2ML IJ SOLN: 4.0000 mg | Freq: Four times a day (QID) | INTRAMUSCULAR | Status: DC | PRN

## 2014-12-03 MED ORDER — KETOROLAC TROMETHAMINE 10 MG PO TABS
10.0000 mg | ORAL_TABLET | Freq: Four times a day (QID) | ORAL | Status: DC | PRN
Start: 2014-12-03 — End: 2014-12-04
  Administered 2014-12-03 – 2014-12-04 (×3): 10 mg via ORAL
  Filled 2014-12-03 (×6): qty 1

## 2014-12-03 MED ORDER — NICOTINE 21 MG/24HR TD PT24
21.0000 mg | MEDICATED_PATCH | TRANSDERMAL | Status: DC
  Administered 2014-12-03 – 2014-12-04 (×2): 21 mg via TRANSDERMAL
  Filled 2014-12-03 (×3): qty 1

## 2014-12-03 MED ORDER — MORPHINE SULFATE 4 MG/ML IJ SOLN
INTRAMUSCULAR | Status: AC
  Filled 2014-12-03: qty 1

## 2014-12-03 MED ORDER — PANTOPRAZOLE SODIUM 40 MG PO TBEC
80.0000 mg | DELAYED_RELEASE_TABLET | Freq: Every day | ORAL | Status: DC
  Administered 2014-12-03 – 2014-12-04 (×2): 80 mg via ORAL
  Filled 2014-12-03 (×2): qty 2

## 2014-12-03 MED ORDER — MAGNESIUM OXIDE 400 (241.3 MG) MG PO TABS
400.0000 mg | ORAL_TABLET | Freq: Two times a day (BID) | ORAL | Status: AC
  Administered 2014-12-03 – 2014-12-04 (×3): 400 mg via ORAL
  Filled 2014-12-03 (×3): qty 1

## 2014-12-03 MED ORDER — ASPIRIN EC 325 MG PO TBEC
325.0000 mg | DELAYED_RELEASE_TABLET | Freq: Every day | ORAL | Status: DC
  Administered 2014-12-03 – 2014-12-04 (×2): 325 mg via ORAL
  Filled 2014-12-03 (×2): qty 1

## 2014-12-03 NOTE — Progress Notes (Signed)
Patient wanted something else for pain, notified MD, no new orders given, pt to DC tomorrow.  Hermina BartersBOWMAN, Amrit Erck M, RN

## 2014-12-03 NOTE — Progress Notes (Signed)
Utilization Review Completed.  

## 2014-12-03 NOTE — Progress Notes (Signed)
Pt safely transferred to 2W18, report called to receiving RN, pt meds and chart accompanying her. Pt husband and belongings sent with pt. No complaints of pain, VS WNL, GCS 15.

## 2014-12-03 NOTE — Progress Notes (Signed)
Initial Nutrition Assessment  DOCUMENTATION CODES:  Obesity unspecified  INTERVENTION:  Ensure Enlive (each supplement provides 350kcal and 20 grams of protein)  NUTRITION DIAGNOSIS:  Inadequate oral intake related to  (poor appetite) as evidenced by per patient/family report  GOAL:  Patient will meet greater than or equal to 90% of their needs  MONITOR:  Diet advancement, PO intake, Supplement acceptance, Weight trends, I & O's, Labs  REASON FOR ASSESSMENT:  Malnutrition Screening Tool  ASSESSMENT: 39 y.o. Female with a PMHx of GERD, renal disorder, and hyperlipidemia who presented to the ED complaining of constant, ongoing, progressively worsening L sided chest pain; Pt also reports associated shortness of breath, nausea, and nonproductive cough.   Patient reports a decreased appetite x 1 week.  Was still consuming solid food but in smaller portions.  Also reports a 10 lb weight loss x 1 month.  Per readings below, patient's weight has trended up since April 2016.  Advanced to a Heart Healthy diet this AM.  Would like chocolate Ensure supplement.  RD to order.  Nutrition focused physical exam completed.  No muscle or subcutaneous fat depletion noticed.  Height:  Ht Readings from Last 1 Encounters:  12/03/14 5\' 3"  (1.6 m)    Weight:  Wt Readings from Last 1 Encounters:  12/03/14 203 lb 7.8 oz (92.3 kg)    Ideal Body Weight:  52.2 kg  Wt Readings from Last 10 Encounters:  12/03/14 203 lb 7.8 oz (92.3 kg)  11/28/14 210 lb (95.255 kg)  10/29/14 200 lb (90.719 kg)  10/07/14 200 lb (90.719 kg)  07/26/14 200 lb (90.719 kg)  06/06/14 200 lb (90.719 kg)  04/16/14 190 lb (86.183 kg)  04/06/14 190 lb (86.183 kg)  03/16/14 200 lb (90.719 kg)  01/10/14 192 lb (87.091 kg)    BMI:  Body mass index is 36.05 kg/(m^2).  Estimated Nutritional Needs:  Kcal:  2000-2200  Protein:  100-110 gm  Fluid:  2.0-2.2 L  Skin:  Reviewed, no issues  Diet Order:  Diet Heart  Room service appropriate?: Yes; Fluid consistency:: Thin  EDUCATION NEEDS:  No education needs identified at this time   Intake/Output Summary (Last 24 hours) at 12/03/14 1223 Last data filed at 12/03/14 0800  Gross per 24 hour  Intake   2575 ml  Output      0 ml  Net   2575 ml    Last BM:  unknown   Maureen ChattersKatie Eufelia Veno, RD, LDN Pager #: [phone] After-Hours Pager #: [phone]
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
## 2014-12-03 NOTE — Progress Notes (Addendum)
[facility] TEAM 1 - Stepdown/ICU TEAM Progress Note  [name] ZOX:[id number] DOB: Aug 23, 1975 DOA: 12/02/2014 PCP: Aida Puffer, MD  Admit HPI / Brief Narrative: [name] is a 39 y.o. female with PMHx severe recurrent major depression without psychotic features, suicidal ideations, anxiety, panic disorder, generalized anxiety disorder, bipolar disorder, hypotension, chronic pelvic pain, tobacco abuse, Polysubstance abuse (5/12 UDS positive opiates, cocaine, benzodiazepine, amphetamines), hyperlipidemia, history of colitis.  Presents with chest pain.She reports that she started having chest pain at about 1 PM. Her chest pain is located in the left lower chest, persistent, 7 out of 10 in severity, sharp and squeezing like pain, radiating to the left neck. It is associated with mild shortness of breath. She has dry cough which she attributes it to smoking. her chest pain is pleuritic, it is aggravated by deep breath. There is no tenderness over chest wall. Denies recent long distant traveling. No tenderness over calf areas. Of note, patient has significant history of psych issues, including severe depression, anxiety and bipolar disorder. Today patient feels that her psych issues are stable, no suicidal or homicidal ideations. She denies drug use.   Currently patient denies fever, chills, running nose, ear pain, headaches, abdominal pain, diarrhea, constipation, dysuria, urgency, frequency, hematuria, skin rashes, joint pain or leg swelling. No unilateral weakness, numbness or tingling sensations. No vision change or hearing loss.  In ED, patient was found to have negative d-dimer 0.43, negative urinalysis, negative troponin, negative pregnancy test, temperature normal, potassium 2.8, chest x-ray is negative for acute abnormalities. The patient is admitted to inpatient for further evaluation and treatment.   HPI/Subjective: 5/17 A/O 4, still complains of sternal chest pain, negative SOB,  negative diaphoresis, patient sitting comfortably in bed. States her previous drug user 1 time a decision.  Assessment/Plan: Chest pain: - Patient has pleuritic chest pain, but d-dimer is negative, unlikely to have pulmonary embolism. Chest x-ray has no pneumonia. Other differential diagnoses include musculoskeletal pain, pericarditis, drug abuse, less likely to have ACS. Patient still has 7/10 chest pain currently. -Troponin 1 negative, continue to cycle troponins - EKG from 5/17 compared to EKG 01/06/2014 shows new flipped T waves in leads V1 through V3 cannot rule out anterior septal ischemia/MI  -Echocardiogram essentially negative for structural changes - Reproducible chest pain most likely noncardiac in nature; Toradol 10 mg QID PRN pain - Hemoglobin A1c pending   - UDS pending -HIV ab pending  Drug-seeking behavior -Patient is a significant substance abuser, with underlying psychiatric diagnosis. Patient recently positive for cocaine, benzodiazepines, opiates. -Patient now requesting additional IV opiates for noncardiac chest pain and no quantitative signs of increased pain (normal HR, normal BP, negative diaphoresis, sitting quietly in bed). Patient counseled that we are moving her toward discharge and would be stopping the IV morphine. -Considering patient's underlying substance abuse not a candidate for discharge on oral opiates.   GERD - Protonix and Mylanta   HLD: -Lipid panel within NECP guidelines. -Start Lipitor 40 mg daily  Polysubstance abuse:  -Her previous UDS was positive for cocaine and amphetamine on 11/28/14. Patient denies drug use today. She also has tobacco abuse. -Did counseling about importance of quitting smoking -Nicotine patch -consult to CSW  for substance abuse  Psych issues:  -Generalized anxiety, severe depression and bipolar. Stable, no suicidal or homicidal ideations today. -Clonazepam 0.5 mg BID PRN -Increase Zoloft to 150 mg daily -  Continue trazodone 100 mg QHS PRN  Hypokalemia:  -Resolved   Hypomagnesemia  -Magnesium oxide400  mg BID    Code Status: FULL Family Communication: no family present at time of exam Disposition Plan: If troponins negative would discharge.    Consultants: NA  Procedure/Significant Events: 5/17 echocardiogram;- LVEF= 55% to 60%.    Culture 5/17 positive MRSA by PCR  Antibiotics: NA  DVT prophylaxis: Subcutaneous heparin   Devices    LINES / TUBES:     Continuous Infusions: . sodium chloride      Objective: VITAL SIGNS: Temp: 97.8 F (36.6 C) (05/17 0829) Temp Source: Oral (05/17 0829) BP: 96/59 mmHg (05/17 0800) Pulse Rate: 58 (05/17 0829) SPO2; FIO2:   Intake/Output Summary (Last 24 hours) at 12/03/14 1124 Last data filed at 12/03/14 0800  Gross per 24 hour  Intake   2575 ml  Output      0 ml  Net   2575 ml     Exam: General: A/O 4, NAD, No acute respiratory distress Lungs: Clear to auscultation bilaterally without wheezes or crackles Cardiovascular: Regular rate and rhythm without murmur gallop or rub normal S1 and S2 Abdomen: Nontender, nondistended, soft, bowel sounds positive, no rebound, no ascites, no appreciable mass Extremities: No significant cyanosis, clubbing, or edema bilateral lower extremities   Data Reviewed: Basic Metabolic Panel:  Recent Labs Lab 11/28/14 0130 11/28/14 2029 11/29/14 1126 12/02/14 2110 12/03/14 0530  NA 137 136 142 144 139  K 3.5 3.6 3.8 2.8* 4.1  CL 109 114* 107 106 112*  CO2 20* 20*  --   --  20*  GLUCOSE 133* 88 95 130* 103*  BUN 8 8 4* 9 8  CREATININE 0.83 0.57 0.50 0.50 0.61  CALCIUM 8.6* 8.4*  --   --  8.0*  MG  --   --   --   --  1.5*   Liver Function Tests:  Recent Labs Lab 11/28/14 0130 12/03/14 0530  AST 96* 46*  ALT 105* 53  ALKPHOS 74 49  BILITOT 0.5 0.8  PROT 7.2 5.8*  ALBUMIN 3.6 2.8*    Recent Labs Lab 11/28/14 0130  LIPASE 24   No results for input(s):  AMMONIA in the last 168 hours. CBC:  Recent Labs Lab 11/28/14 0130 11/28/14 2029 11/29/14 1126 12/02/14 2101 12/02/14 2110 12/03/14 0530  WBC 7.5 5.1  --  7.2  --  5.3  NEUTROABS 4.1  --   --  3.9  --   --   HGB 12.6 12.2 13.9 12.4 13.9 10.9*  HCT 37.1 36.3 41.0 37.1 41.0 32.2*  MCV 85.1 86.6  --  84.5  --  85.9  PLT 281 264  --  326  --  245   Cardiac Enzymes:  Recent Labs Lab 12/03/14 0530  TROPONINI <0.03   BNP (last 3 results) No results for input(s): BNP in the last 8760 hours.  ProBNP (last 3 results)  Recent Labs  01/06/14 2042  PROBNP 141.3*    CBG:  Recent Labs Lab 12/03/14 0833  GLUCAP 93    Recent Results (from the past 240 hour(s))  Urine culture     Status: None   Collection Time: 11/23/14  3:50 PM  Result Value Ref Range Status   Specimen Description URINE, RANDOM  Final   Special Requests NONE  Final   Colony Count NO GROWTH [facility]   Final   Culture NO GROWTH [facility]formed at [facility]   Final   Report Status 11/24/2014 FINAL  Final  MRSA PCR Screening  Status: Abnormal   Collection Time: 12/03/14  5:00 AM  Result Value Ref Range Status   MRSA by PCR POSITIVE (A) NEGATIVE Final    Comment:        The GeneXpert MRSA Assay (FDA approved for NASAL specimens only), is one component of a comprehensive MRSA colonization surveillance program. It is not intended to diagnose MRSA infection nor to guide or monitor treatment for MRSA infections. RESULT CALLED TO, READ BACK BY AND VERIFIED WITH: Baldemar LenisL. ROBINSON RN 9:45 12/03/14 ([email or web address])      Studies:  Recent x-ray studies have been reviewed in detail by the Attending Physician  Scheduled Meds:  Scheduled Meds: . aspirin EC  325 mg Oral Daily  . atorvastatin  40 mg Oral q1800  . Chlorhexidine Gluconate Cloth  6 each Topical Q0600  . heparin  5,000 Units Subcutaneous 3 times per day  . ibuprofen  400 mg Oral 3 times per day  . magnesium oxide   400 mg Oral BID  . Melatonin  6 mg Oral QHS  . mupirocin ointment  1 application Nasal BID  . nicotine  21 mg Transdermal Q24H  . pantoprazole  80 mg Oral Daily  . [START ON 12/04/2014] pneumococcal 23 valent vaccine  0.5 mL Intramuscular Tomorrow-1000  . [START ON 12/04/2014] sertraline  150 mg Oral Daily  . sodium chloride  3 mL Intravenous Q12H    Time spent on care of this patient: 40 mins   WOODS, Roselind MessierURTIS J , MD  [facility] Office  [phone] Pager [phone]  On-Call/Text Page:      [email or web address]      password [email or web address]  If 7PM-7AM, please contact night-coverage [email or web address] Password [email or web address] 12/03/2014, 11:24 AM   LOS: 0 days   Care during the described time interval was provided by me .  I have reviewed this patient's available data, including medical history, events of note, physical examination, radiology studies and test results as part of my evaluation  Carolyne Littlesurtis Woods, MD [phone] Pager

## 2014-12-03 NOTE — Progress Notes (Signed)
*  PRELIMINARY RESULTS* Echocardiogram 2D Echocardiogram has been performed.  [name], [name] 12/03/2014, 9:47 AM

## 2014-12-03 NOTE — ED Notes (Signed)
Patient requesting something else for her squeezing in her chest

## 2014-12-04 DIAGNOSIS — K219 Gastro-esophageal reflux disease without esophagitis: Secondary | ICD-10-CM

## 2014-12-04 LAB — CBC
HCT: 36 % (ref 36.0–46.0)
Hemoglobin: 12.1 g/dL (ref 12.0–15.0)
MCH: 28.8 pg (ref 26.0–34.0)
MCHC: 33.6 g/dL (ref 30.0–36.0)
MCV: 85.7 fL (ref 78.0–100.0)
Platelets: 243 10*3/uL (ref 150–400)
RBC: 4.2 MIL/uL (ref 3.87–5.11)
RDW: 13.3 % (ref 11.5–15.5)
WBC: 5.3 10*3/uL (ref 4.0–10.5)

## 2014-12-04 LAB — COMPREHENSIVE METABOLIC PANEL
ALK PHOS: 51 U/L (ref 38–126)
ALT: 47 U/L (ref 14–54)
ANION GAP: 5 (ref 5–15)
AST: 42 U/L — ABNORMAL HIGH (ref 15–41)
Albumin: 2.6 g/dL — ABNORMAL LOW (ref 3.5–5.0)
BILIRUBIN TOTAL: 0.6 mg/dL (ref 0.3–1.2)
BUN: 7 mg/dL (ref 6–20)
CHLORIDE: 111 mmol/L (ref 101–111)
CO2: 25 mmol/L (ref 22–32)
Calcium: 8.3 mg/dL — ABNORMAL LOW (ref 8.9–10.3)
Creatinine, Ser: 0.55 mg/dL (ref 0.44–1.00)
GFR calc non Af Amer: >60 mL/min (ref >60)
GLUCOSE: 93 mg/dL (ref 65–99)
Potassium: 3.7 mmol/L (ref 3.5–5.1)
Sodium: 141 mmol/L (ref 135–145)
Total Protein: 5.8 g/dL — ABNORMAL LOW (ref 6.5–8.1)

## 2014-12-04 LAB — RAPID URINE DRUG SCREEN, HOSP PERFORMED
Amphetamines: NOT DETECTED
Barbiturates: NOT DETECTED
Benzodiazepines: NOT DETECTED
Cocaine: NOT DETECTED
Opiates: POSITIVE — AB
Tetrahydrocannabinol: NOT DETECTED

## 2014-12-04 LAB — HEMOGLOBIN A1C
Hgb A1c MFr Bld: 5.3 % (ref 4.8–5.6)
Mean Plasma Glucose: 105 mg/dL

## 2014-12-04 LAB — MAGNESIUM: Magnesium: 1.8 mg/dL (ref 1.7–2.4)

## 2014-12-04 LAB — GLUCOSE, CAPILLARY: Glucose-Capillary: 100 mg/dL — ABNORMAL HIGH (ref 65–99)

## 2014-12-04 MED ORDER — NICOTINE 21 MG/24HR TD PT24: 21.0000 mg | MEDICATED_PATCH | TRANSDERMAL | Status: DC

## 2014-12-04 MED ORDER — IBUPROFEN 400 MG PO TABS
400.0000 mg | ORAL_TABLET | Freq: Three times a day (TID) | ORAL | Status: DC | PRN
Start: 2014-12-04 — End: 2015-03-04

## 2014-12-04 MED ORDER — SERTRALINE HCL 100 MG PO TABS: 150.0000 mg | ORAL_TABLET | Freq: Every day | ORAL | Status: DC

## 2014-12-04 NOTE — Discharge Summary (Signed)
Physician Discharge Summary  [name] RUE:[id number] DOB: Jan 25, 1976 DOA: 12/02/2014  PCP: Aida Puffer, MD  Admit date: 12/02/2014 Discharge date: 12/04/2014  Time spent: >30 minutes  Recommendations for Outpatient Follow-up:  1. Reassess CP and adjust medications for reflux as needed  Discharge Diagnoses:  Principal Problem:   Chest pain Active Problems:   GAD (generalized anxiety disorder)   GERD (gastroesophageal reflux disease)   Tobacco abuse   Recurrent major depression-severe   HLD (hyperlipidemia)   Substance abuse   Hypokalemia   Discharge Condition: stable and improved. Will follow with PCP in 2 weeks. No CP at discharge  Diet recommendation: heart healthy (given dyslipidemia)  Filed Weights   12/02/14 2037 12/03/14 0459  Weight: 90.719 kg (200 lb) 92.3 kg (203 lb 7.8 oz)    History of present illness:  39 y.o. female with PMH of tobacco abuse, Polysubstance abuse, hyperlipidemia, tobacco abuse, GERD, depression, anxiety, bronchitis, bipolar disorder, history of colitis, who presents with chest pain.  She reports that she started having chest pain at about 1 PM. Her chest pain is located in the left lower chest, persistent, 7 out of 10 in severity, sharp and squeezing like pain, radiating to the left neck. It is associated with mild shortness of breath. She has dry cough which she attributes it to smoking. her chest pain is pleuritic, it is aggravated by deep breath. There is no tenderness over chest wall. Denies recent long distant traveling. No tenderness over calf areas. Of note, patient has significant history of psych issues, including severe depression, anxiety and bipolar disorder. Today patient feels that her psych issues are stable, no suicidal or homicidal ideations.  Hospital Course:  1-chest pain: non-cardiac in nature -neg troponin X3 -2-D echo w/o wall motion abnormalities and normal EF -no EKG changes to suggest ischemia. -treated with  ibuprofen for pain control, given most likely costochondritis -other consideration is her GERD; will continue PPI and lifestyle changes  2-GERD: advise to quit smoking and continue use of PPI -discussion regarding lifestyle changes to her GERD provided  3-dyslipidemia: LDL 82 and HDL 27 -advise to use fish oil and follow low fat diet -no statins needed   4-hypokalemia: repleted; most likely due to GI losses; per patient she had some vomiting prior to admission. -at discharge K and Mg WNL  5-depression and anxiety: stable and w/o hallucinations or SI -will continue home medication regimen  6-tobacco abuse: cessation counseling provided. Patient discharge on nicotine patch      Procedures:  See below for x-ray reports   2-D echo  - Left ventricle: The cavity size was normal. Wall thickness was normal. Systolic function was normal. The estimated ejection fraction was in the range of 55% to 60%. Wall motion was normal; there were no regional wall motion abnormalities. Left ventricular diastolic function parameters were normal. - Mitral valve: Mildly thickened anterior leaflet. Trivial regurgitation. - Tricuspid valve: There was mild regurgitation. - Pulmonary arteries: PA peak pressure: 20 mm Hg (S). - Inferior vena cava: The vessel was normal in size. The respirophasic diameter changes were in the normal range (>= 50%), consistent with normal central venous pressure.  Impressions: - LVEF 55-60%, normal wall thickness, normal wall motion, normal diastolic function, mild AMVL thickening with trivial MR, mild TR, normal RVSP.  Consultations:  None   Discharge Exam: Filed Vitals:   12/04/14 0530  BP: 122/83  Pulse: 78  Temp: 98.1 F (36.7 C)  Resp: 18    General: afebrile, NAD and  no CP Cardiovascular: S1 and S2, no rubs or gallops Respiratory: CTA bilaterally Abd: soft, NT, positive BS  Discharge Instructions   Discharge Instructions     Discharge instructions    Complete by:  As directed   Follow up with PCP in 2 weeks Take medications as prescribed Stop smoking Please avoid the use of recreational drugs Maintain yourself well hydrated          Current Discharge Medication List    START taking these medications   Details  ibuprofen (ADVIL,MOTRIN) 400 MG tablet Take 1 tablet (400 mg total) by mouth every 8 (eight) hours as needed (pain). Qty: 30 tablet, Refills: 0    nicotine (NICODERM CQ - DOSED IN MG/24 HOURS) 21 mg/24hr patch Place 1 patch (21 mg total) onto the skin daily. Qty: 28 patch, Refills: 0      CONTINUE these medications which have CHANGED   Details  sertraline (ZOLOFT) 100 MG tablet Take 1.5 tablets (150 mg total) by mouth daily. Qty: 45 tablet, Refills: 0      CONTINUE these medications which have NOT CHANGED   Details  albuterol (PROVENTIL HFA;VENTOLIN HFA) 108 (90 BASE) MCG/ACT inhaler Inhale 2 puffs into the lungs every 4 (four) hours as needed for wheezing or shortness of breath.    aspirin-acetaminophen-caffeine (EXCEDRIN MIGRAINE) 250-250-65 MG per tablet Take 2 tablets by mouth daily as needed for headache. Qty: 30 tablet, Refills: 0    clonazePAM (KLONOPIN) 0.5 MG tablet Take 1 tablet (0.5 mg total) by mouth 2 (two) times daily as needed for anxiety. Qty: 12 tablet, Refills: 0    hydrOXYzine (VISTARIL) 50 MG capsule Take 50 mg by mouth 3 (three) times daily as needed. For anxiety Refills: 0    pantoprazole (PROTONIX) 40 MG tablet Take 2 tablets (80 mg total) by mouth daily. Qty: 60 tablet, Refills: 0    traZODone (DESYREL) 100 MG tablet Take 100 mg by mouth at bedtime as needed for sleep.     Melatonin 3 MG TABS Take 6 mg by mouth at bedtime.      STOP taking these medications     doxycycline (VIBRA-TABS) 100 MG tablet      naproxen (NAPROSYN) 500 MG tablet      phenazopyridine (PYRIDIUM) 200 MG tablet        Allergies  Allergen Reactions  . Aripiprazole Other (See  Comments)    Causes seizures  . Risperidone And Related Anaphylaxis  . Tramadol Anaphylaxis  . Lamictal [Lamotrigine] Other (See Comments)    Blurry vision    Follow-up Information    Follow up with LITTLE,JAMES, MD. Schedule an appointment as soon as possible for a visit in 2 weeks.   Specialty:  Family Medicine   Contact information:   [address] [place] [id number] [phone]        The results of significant diagnostics from this hospitalization (including imaging, microbiology, ancillary and laboratory) are listed below for reference.    Significant Diagnostic Studies: Dg Chest 2 View  12/02/2014   CLINICAL DATA:  Acute onset of left-sided chest pain and weakness. Initial encounter.  EXAM: CHEST  2 VIEW  COMPARISON:  Chest radiograph performed 01/06/2014  FINDINGS: The lungs are well-aerated and clear. There is no evidence of focal opacification, pleural effusion or pneumothorax.  The heart is normal in size; the mediastinal contour is within normal limits. No acute osseous abnormalities are seen. Clips are noted within the right upper quadrant, reflecting prior cholecystectomy.  IMPRESSION: No acute cardiopulmonary process seen.   Electronically Signed   By: Roanna Raider M.D.   On: 12/02/2014 21:23   US Renal  11/28/2014   CLINICAL DATA:  Right flank pain.  EXAM: RENAL / URINARY TRACT ULTRASOUND COMPLETE  COMPARISON:  CT abdomen and pelvis 11/25/2014  FINDINGS: Right Kidney:  Length: 11.7 cm. Echogenicity within normal limits. No mass or hydronephrosis visualized.  Left Kidney:  Length: 12.6 cm. Echogenicity within normal limits. No mass or hydronephrosis visualized.  Bladder:  Normal appearance. No bladder wall thickening. No filling defects. Prevoid volume measured at 136 mL.  IMPRESSION: Normal sound appearance of the kidneys and bladder. Tiny stones demonstrated on previous CT are not appreciated on ultrasound.   Electronically Signed   By: Burman Nieves M.D.   On:  11/28/2014 03:58   Ct Renal Stone Study  11/23/2014   CLINICAL DATA:  RIGHT groin and flank pain for 1 week. Chills and fever. Dysuria. Cholecystectomy.  EXAM: CT ABDOMEN AND PELVIS WITHOUT CONTRAST  TECHNIQUE: Multidetector CT imaging of the abdomen and pelvis was performed following the standard protocol without IV contrast.  COMPARISON:  CT 07/26/2014.  FINDINGS: Musculoskeletal:  Normal.  Lung Bases: Atelectasis. Isolated sub 5 mm pulmonary nodule in the RIGHT middle lobe not visible on prior exams. No further evaluation is warranted under guidelines.  Liver: Unenhanced CT was performed per clinician order. Lack of IV contrast limits sensitivity and specificity, especially for evaluation of abdominal/pelvic solid viscera. Normal.  Spleen:  Normal.  Gallbladder:  Cholecystectomy.  Common bile duct:  Normal.  Pancreas:  Normal.  Adrenal glands:  Normal bilaterally.  Kidneys: Bilateral punctate nonobstructing renal collecting system calculi. The LEFT ureter appears normal. RIGHT ureter is normal. No hydronephrosis.  Stomach:  Grossly normal.  Collapsed.  Small bowel:  Grossly normal.  Colon:   Normal.  Pelvic Genitourinary:  Normal.  Peritoneum: No free air.  No free fluid.  Vascular/lymphatic: Normal.  Body Wall: Fat containing periumbilical hernia.  IMPRESSION: No acute abnormality. Unchanged bilateral punctate nonobstructing renal collecting system calculi. Cholecystectomy.   Electronically Signed   By: Andreas Newport M.D.   On: 11/23/2014 20:18    Microbiology: Recent Results (from the past 240 hour(s))  MRSA PCR Screening     Status: Abnormal   Collection Time: 12/03/14  5:00 AM  Result Value Ref Range Status   MRSA by PCR POSITIVE (A) NEGATIVE Final    Comment:        The GeneXpert MRSA Assay (FDA approved for NASAL specimens only), is one component of a comprehensive MRSA colonization surveillance program. It is not intended to diagnose MRSA infection nor to guide or monitor treatment  for MRSA infections. RESULT CALLED TO, READ BACK BY AND VERIFIED WITH: Baldemar Lenis RN 9:45 12/03/14 ([email or web address])      Labs: Basic Metabolic Panel:  Recent Labs Lab 11/28/14 0130 11/28/14 2029 11/29/14 1126 12/02/14 2110 12/03/14 0530 12/04/14 0435  NA 137 136 142 144 139 141  K 3.5 3.6 3.8 2.8* 4.1 3.7  CL 109 114* 107 106 112* 111  CO2 20* 20*  --   --  20* 25  GLUCOSE 133* 88 95 130* 103* 93  BUN 8 8 4* CREATININE 0.83 0.57 0.50 0.50 0.61 0.55  CALCIUM 8.6* 8.4*  --   --  8.0* 8.3*  MG  --   --   --   --  1.5* 1.8   Liver Function Tests:  Recent  Labs Lab 11/28/14 0130 12/03/14 0530 12/04/14 0435  AST 96* 46* 42*  ALT 105* 53 47  ALKPHOS 74 49 51  BILITOT 0.5 0.8 0.6  PROT 7.2 5.8* 5.8*  ALBUMIN 3.6 2.8* 2.6*    Recent Labs Lab 11/28/14 0130  LIPASE 24   No results for input(s): AMMONIA in the last 168 hours. CBC:  Recent Labs Lab 11/28/14 0130 11/28/14 2029 11/29/14 1126 12/02/14 2101 12/02/14 2110 12/03/14 0530 12/04/14 0435  WBC 7.5 5.1  --  7.2  --  5.3 5.3  NEUTROABS 4.1  --   --  3.9  --   --   --   HGB 12.6 12.2 13.9 12.4 13.9 10.9* 12.1  HCT 37.1 36.3 41.0 37.1 41.0 32.2* 36.0  MCV 85.1 86.6  --  84.5  --  85.9 85.7  PLT 281 264  --  326  --  245 243   Cardiac Enzymes:  Recent Labs Lab 12/03/14 0530 12/03/14 1117 12/03/14 1644  TROPONINI <0.03 <0.03 <0.03   BNP: BNP (last 3 results) No results for input(s): BNP in the last 8760 hours.  ProBNP (last 3 results)  Recent Labs  01/06/14 2042  PROBNP 141.3*    CBG:  Recent Labs Lab 12/03/14 0833  GLUCAP 93       Signed:  Vassie LollMadera, Evangeline Utley  [facility] 12/04/2014, 10:01 AM
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
## 2015-01-28 ENCOUNTER — Encounter (HOSPITAL_COMMUNITY): Payer: Self-pay | Admitting: Emergency Medicine

## 2015-01-28 ENCOUNTER — Emergency Department (HOSPITAL_COMMUNITY)
Admission: EM | Admit: 2015-01-28 | Discharge: 2015-01-28 | Disposition: A | Discharge status: Home / Self Care | Payer: Medicaid Other | Attending: Emergency Medicine | Admitting: Emergency Medicine

## 2015-01-28 DIAGNOSIS — R109 Unspecified abdominal pain: Secondary | ICD-10-CM | POA: Diagnosis present

## 2015-01-28 DIAGNOSIS — F319 Bipolar disorder, unspecified: Secondary | ICD-10-CM | POA: Insufficient documentation

## 2015-01-28 DIAGNOSIS — Z87442 Personal history of urinary calculi: Secondary | ICD-10-CM | POA: Diagnosis not present

## 2015-01-28 DIAGNOSIS — G8929 Other chronic pain: Secondary | ICD-10-CM | POA: Diagnosis not present

## 2015-01-28 DIAGNOSIS — Z8709 Personal history of other diseases of the respiratory system: Secondary | ICD-10-CM | POA: Diagnosis not present

## 2015-01-28 DIAGNOSIS — K219 Gastro-esophageal reflux disease without esophagitis: Secondary | ICD-10-CM | POA: Insufficient documentation

## 2015-01-28 DIAGNOSIS — R1031 Right lower quadrant pain: Secondary | ICD-10-CM | POA: Insufficient documentation

## 2015-01-28 DIAGNOSIS — Z87440 Personal history of urinary (tract) infections: Secondary | ICD-10-CM | POA: Diagnosis not present

## 2015-01-28 DIAGNOSIS — Z8639 Personal history of other endocrine, nutritional and metabolic disease: Secondary | ICD-10-CM | POA: Diagnosis not present

## 2015-01-28 DIAGNOSIS — Z8742 Personal history of other diseases of the female genital tract: Secondary | ICD-10-CM | POA: Diagnosis not present

## 2015-01-28 DIAGNOSIS — R112 Nausea with vomiting, unspecified: Secondary | ICD-10-CM | POA: Diagnosis not present

## 2015-01-28 DIAGNOSIS — Z3202 Encounter for pregnancy test, result negative: Secondary | ICD-10-CM | POA: Insufficient documentation

## 2015-01-28 DIAGNOSIS — F419 Anxiety disorder, unspecified: Secondary | ICD-10-CM | POA: Insufficient documentation

## 2015-01-28 DIAGNOSIS — Z79899 Other long term (current) drug therapy: Secondary | ICD-10-CM | POA: Diagnosis not present

## 2015-01-28 DIAGNOSIS — Z720 Tobacco use: Secondary | ICD-10-CM | POA: Diagnosis not present

## 2015-01-28 LAB — URINE MICROSCOPIC-ADD ON

## 2015-01-28 LAB — COMPREHENSIVE METABOLIC PANEL
ALBUMIN: 3.4 g/dL — AB (ref 3.5–5.0)
ALK PHOS: 67 U/L (ref 38–126)
ALT: 52 U/L (ref 14–54)
ANION GAP: 6 (ref 5–15)
AST: 67 U/L — ABNORMAL HIGH (ref 15–41)
BUN: 5 mg/dL — AB (ref 6–20)
CO2: 26 mmol/L (ref 22–32)
CREATININE: 0.63 mg/dL (ref 0.44–1.00)
Calcium: 8.5 mg/dL — ABNORMAL LOW (ref 8.9–10.3)
Chloride: 106 mmol/L (ref 101–111)
GFR calc non Af Amer: >60 mL/min (ref >60)
Glucose, Bld: 90 mg/dL (ref 65–99)
POTASSIUM: 3.6 mmol/L (ref 3.5–5.1)
SODIUM: 138 mmol/L (ref 135–145)
Total Bilirubin: 0.7 mg/dL (ref 0.3–1.2)
Total Protein: 7.2 g/dL (ref 6.5–8.1)

## 2015-01-28 LAB — CBC
HCT: 35.6 % — ABNORMAL LOW (ref 36.0–46.0)
Hemoglobin: 12 g/dL (ref 12.0–15.0)
MCH: 29.1 pg (ref 26.0–34.0)
MCHC: 33.7 g/dL (ref 30.0–36.0)
MCV: 86.2 fL (ref 78.0–100.0)
Platelets: 289 10*3/uL (ref 150–400)
RBC: 4.13 MIL/uL (ref 3.87–5.11)
RDW: 13.7 % (ref 11.5–15.5)
WBC: 5 10*3/uL (ref 4.0–10.5)

## 2015-01-28 LAB — URINALYSIS, ROUTINE W REFLEX MICROSCOPIC
BILIRUBIN URINE: NEGATIVE
Glucose, UA: NEGATIVE mg/dL
Ketones, ur: NEGATIVE mg/dL
Leukocytes, UA: NEGATIVE
NITRITE: NEGATIVE
Protein, ur: NEGATIVE mg/dL
Specific Gravity, Urine: 1.006 (ref 1.005–1.030)
Urobilinogen, UA: 0.2 mg/dL (ref 0.0–1.0)
pH: 6 (ref 5.0–8.0)

## 2015-01-28 LAB — LIPASE, BLOOD: LIPASE: 23 U/L (ref 22–51)

## 2015-01-28 LAB — PREGNANCY, URINE: PREG TEST UR: NEGATIVE

## 2015-01-28 MED ORDER — MORPHINE SULFATE 4 MG/ML IJ SOLN
4.0000 mg | Freq: Once | INTRAMUSCULAR | Status: AC
  Administered 2015-01-28: 4 mg via INTRAVENOUS
  Filled 2015-01-28: qty 1

## 2015-01-28 MED ORDER — ONDANSETRON HCL 4 MG/2ML IJ SOLN
4.0000 mg | Freq: Once | INTRAMUSCULAR | Status: AC
  Administered 2015-01-28: 4 mg via INTRAVENOUS
  Filled 2015-01-28: qty 2

## 2015-01-28 MED ORDER — SODIUM CHLORIDE 0.9 % IV BOLUS (SEPSIS)
1000.0000 mL | Freq: Once | INTRAVENOUS | Status: AC
  Administered 2015-01-28: 1000 mL via INTRAVENOUS
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
## 2015-01-28 NOTE — Discharge Instructions (Signed)
Abdominal Pain °Many things can cause abdominal pain. Usually, abdominal pain is not caused by a disease and will improve without treatment. It can often be observed and treated at home. Your health care provider will do a physical exam and possibly order blood tests and X-rays to help determine the seriousness of your pain. However, in many cases, more time must pass before a clear cause of the pain can be found. Before that point, your health care provider may not know if you need more testing or further treatment. °HOME CARE INSTRUCTIONS  °Monitor your abdominal pain for any changes. The following actions may help to alleviate any discomfort you are experiencing: °· Only take over-the-counter or prescription medicines as directed by your health care provider. °· Do not take laxatives unless directed to do so by your health care provider. °· Try a clear liquid diet (broth, tea, or water) as directed by your health care provider. Slowly move to a bland diet as tolerated. °SEEK MEDICAL CARE IF: °· You have unexplained abdominal pain. °· You have abdominal pain associated with nausea or diarrhea. °· You have pain when you urinate or have a bowel movement. °· You experience abdominal pain that wakes you in the night. °· You have abdominal pain that is worsened or improved by eating food. °· You have abdominal pain that is worsened with eating fatty foods. °· You have a fever. °SEEK IMMEDIATE MEDICAL CARE IF:  °· Your pain does not go away within 2 hours. °· You keep throwing up (vomiting). °· Your pain is felt only in portions of the abdomen, such as the right side or the left lower portion of the abdomen. °· You pass bloody or black tarry stools. °MAKE SURE YOU: °· Understand these instructions.   °· Will watch your condition.   °· Will get help right away if you are not doing well or get worse.   °Document Released: 04/14/2005 Document Revised: 07/10/2013 Document Reviewed: 03/14/2013 °ExitCare® Patient Information  ©2015 [employer]. This information is not intended to replace advice given to you by your health care provider. Make sure you discuss any questions you have with your health care provider. ° ° °Emergency Department Resource Guide °1) Find a Doctor and Pay Out of Pocket °Although you won't have to find out who is covered by your insurance plan, it is a good idea to ask around and get recommendations. You will then need to call the office and see if the doctor you have chosen will accept you as a new patient and what types of options they offer for patients who are self-pay. Some doctors offer discounts or will set up payment plans for their patients who do not have insurance, but you will need to ask so you aren't surprised when you get to your appointment. ° °2) Contact Your Local Health Department °Not all health departments have doctors that can see patients for sick visits, but many do, so it is worth a call to see if yours does. If you don't know where your local health department is, you can check in your phone book. The CDC also has a tool to help you locate your state's health department, and many state websites also have listings of all of their local health departments. ° °3) Find a Walk-in Clinic °If your illness is not likely to be very severe or complicated, you may want to try a walk in clinic. These are popping up all over the country in pharmacies, drugstores, and shopping centers. They're   usually staffed by nurse practitioners or physician assistants that have been trained to treat common illnesses and complaints. They're usually fairly quick and inexpensive. However, if you have serious medical issues or chronic medical problems, these are probably not your best option. ° °No Primary Care Doctor: °- Call [facility] at  832-8000 - they can help you locate a primary care doctor that  accepts your insurance, provides certain services, etc. °- Physician Referral Service- 1-800-533-3463 ° °Chronic  Pain Problems: °Organization         Address  Phone   Notes  ° [facility]  (336) 297-2271 Patients need to be referred by their primary care doctor.  ° °Medication Assistance: °Organization         Address  Phone   Notes  °[place] [facility] [address] °[place], [place] [address] (336) 641-8030 --Must be a resident of [place] °-- Must have NO insurance coverage whatsoever (no Medicaid/ Medicare, etc.) °-- The pt. MUST have a primary care doctor that directs their care regularly and follows them in the community °  °[facility]  (866) 331-1348   °[employer]  (888) 892-1162   ° °Agencies that provide inexpensive medical care: °Organization         Address  Phone   Notes  °[facility]  (336) 832-8035   °[facility]    (336) 832-7272   °[facility] [address] °[place], [place] [address] (336) 832-4777   °[facility] [address], °[place] (336) 271-4999   °[facility]    (336) 373-0678   °[facility]    (336) 272-1050   °[facility] ° [address], [place] Phone:  (336) 832-4444, Fax:  (336) 832-4440 Hours of Operation:  9 am - 6 pm, M-F.  Also accepts Medicaid/Medicare and self-pay.  °[facility] ° [address], [place] Phone: (336) 832-3150, Fax: (336) 832-3151. Hours of Operation:  8:30 am - 5:30 pm, M-F.  Also accepts Medicaid and self-pay.  °[facility] [place] [address], [place] Phone: (336) 878-6027   °[facility] [address], [place], [place] (336)723-1848, Ext. 123 Mondays & Thursdays: 7-9 AM.  First 15 patients are seen on a first come, first serve basis. °  ° °Medicaid-accepting [place] Providers: ° °Organization         Address  Phone   Notes  °[facility] [address], [place] (336) 641-2100 Also  accepts self-pay patients.  °[facility] [address], [place] ° (336) 856-9996   °[facility] [address], [place] (336) 288-8857   °[facility] [address] [place] [address], [place] (336) 299-7000   °Veita Bland [address], [place]  ° (336) 373-1557 Only accepts [place] Access Medicaid patients after they have their name applied to their card.  ° °Self-Pay (no insurance) in [place]: ° °Organization         Address  Phone   Notes  °Sickle Cell Patients, [facility] [address], [place] (336) 832-1970   °[facility] [address], [place] (336) 832-4400   °[facility] ° [address], [place] (336) 992-4800   °[facility]/Dr. Osei-Bonsu ° [address] [place] [address], [place] or [address], [place] (336) [phone] Phone number for both [place] and [place] locations is the same.  °[facility] [address], [place] (336) 299-0000   °[facility] [address] [place] [address], [place] or [address] (336) 852-7530 °(336) 878-2260   °[facility] [address], [place] (336) 350-1642, phone; (336) 294-5005, fax Sees patients 1st and 3rd Saturday of every month.  Must not qualify for public or private insurance (i.e. Medicaid, Medicare, [place] Health Choice, Veterans' Benefits) • Household income should be no more than 200% of the poverty level •The clinic cannot treat you if you are pregnant or think you are pregnant • Sexually transmitted diseases are not treated at the clinic.  ° ° °Dental Care: °Organization         Address  Phone  Notes  °[facility] [address], [place] (336) 641-6152 Accepts children up to age 21 who are enrolled in Medicaid or [place] Health Choice; pregnant  women with a Medicaid card; and children who have applied for Medicaid or [place] Health Choice, but were declined, whose parents can pay a reduced fee at time of service.  °[facility] [place]  [address], [place] (336) 641-7733 Accepts children up to age 21 who are enrolled in Medicaid or [place] Health Choice; pregnant women with a Medicaid card; and children who have applied for Medicaid or [place] Health Choice, but were declined, whose parents can pay a reduced fee at time of service.  °[facility] ° [address], [place] (336) 641-4533 Patients are seen by appointment only. Walk-ins are not accepted. [facility] will see patients 18 years of age and older. °Monday - Tuesday (8am-5pm) °Most Wednesdays (8:30-5pm) °$30 per visit, cash only  °[facility] ° [address], [place] (336) 641-4533 Patients are seen by appointment only. Walk-ins are not accepted. [facility] will see patients 18 years of age and older. °One Wednesday Evening (Monthly: Volunteer Based).  $30 per visit, cash only  °[facility]  (919) 537-3737 for adults; Children under age 4, call Graduate Pediatric Dentistry at (919) [phone]. Children aged 4-14, please call (919) [phone] to request a pediatric application. ° Dental services are provided in all areas of dental care including fillings, crowns and bridges, complete and partial dentures, implants, gum treatment, root canals, and extractions. Preventive care is also provided. Treatment is provided to both adults and children. °Patients are selected via a lottery and there is often a waiting list. °  °[facility] [address], ° ° (336) 763-8833 [email or web address] °  °[facility] [address], [place], [place] (336)723-1848, Ext. 123 Second and Fourth Thursday of each month, opens at 6:30 AM; Clinic ends at 9 AM.  Patients are  seen on a first-come first-served basis, and a limited number are seen during each clinic.  ° °[facility] ° [address], [place], [place] (336) 723-7904   Eligibility Requirements °You must have lived in [place], or [place] counties for at least the last three months. °  You cannot be eligible for state or federal sponsored healthcare insurance, including Veterans Administration, Medicaid, or Medicare. °  You generally cannot be eligible for healthcare insurance through   your employer.  °  How to apply: °Eligibility screenings are held every Tuesday and Wednesday afternoon from 1:00 pm until 4:00 pm. You do not need an appointment for the interview!  °[facility] [address], [place], [place] 336-631-2330   °[facility]  336-342-8273   °[facility]  336-703-3100   °[facility]  336-570-6415   ° °Behavioral Health Resources in the Community: °Intensive Outpatient Programs °Organization         Address  Phone  Notes  °[facility] [address], [place], [place] 336-878-6098   °[facility] [address], [place], [place] 336-832-9800   °[facility] [address], [place], [place] ° 336-882-2125   °[facility] [address],  °[place], [place] 1-800-853-5163 or 336-641-4981   °Substance Abuse Resources °Organization         Address  Phone  Notes  °[facility]  336-882-2125   °[facility]  336-784-9470   °[facility]  336-285-9073   °[facility]  336-845-3988   °Residential & Outpatient Substance Abuse Program  1-800-659-3381   °Psychological Services °Organization         Address  Phone  Notes  °[facility]  336- 832-9600   °[facility]  336- 378-7881   °[facility] [address], [place] 1-800-853-5163 or 336-641-4981   ° °Mobile Crisis  Teams °Organization         Address  Phone  Notes  °[facility], Mobile Crisis Care Unit  1-877-626-1772   °Assertive °[facility] ° [address] [place], [place] 336-834-9664   °Sharon DeEsch [address] °[place] 336-554-5454   ° °Self-Help/Support Groups °Organization         Address  Phone             Notes  °[facility] - variety of support groups  336- 373-1402 Call for more information  °Narcotics Anonymous (NA), [facility] [address], °[place]  2 meetings at this location  ° °Residential Treatment Programs °Organization         Address  Phone  Notes  °[facility] [address],    °[place]  1-866-801-8205   °[facility] ° [address], [place], [place] 704-293-8524   °[facility] [address], [place] 336-845-3988 Admissions: 8am-3pm M-F  °[facility] [address]    °[place], [place] 336-841-1104   °[facility] [address], [place], [place] 336-379-7146   °[facility] [address]  °[place], [place] 336-285-9073   °[facility] Intensive Outpatient [address], [place], [place] 336-852-3033   °[facility] [address]  °[place], [place] 1-877-615-2722 or 336-784-9470   °[facility] [address] [place], [place] 336-227-7417 Accepts Medicaid  °[facility] [address]  °[place] 1-800-659-3381 Substance Abuse/Addiction Treatment  ° °[place] Behavioral Health Resources °Organization         Address  Phone  Notes  °[facility]  (888) 581-9988   °Julie Brannon, PhD [address] [place], [place]   (336) 349-5553 or (336) 951-0000   °[facility]   [address] °[place], [place] (336) 349-4454   °  [facility] [address], [place], [place] (336) 342-8316  Insurance/Medicaid/sponsorship through [facility]  °[facility] [address]                                    [place], [place] (336) 342-8316 Therapy/tele-psych/case  °[facility] [address]  ° [place], [place] (336) 349-2233    °Dr. Arfeen  (336) 349-4544   °[facility] [place]  [employer] [place] [facility] 1) [address], [place] °2) [address], [place] °3)  [address], [place] (336) 349-3220 °(336) 342-7768 ° °(336) 342-8140   °[place] [facility] (336) 342-1394 or (336) 342-3537 (After Hours)    ° ° ° ° °

## 2015-01-28 NOTE — ED Notes (Signed)
I attempted to collect labs twice and was unsuccessful.  I made nurse aware. 

## 2015-01-28 NOTE — ED Notes (Signed)
Pt states that she has hx of kidney infections.  States that x 3 days, she has been having rt sided flank pain with vomiting and fever.  Pt is afebrile now.

## 2015-01-30 DIAGNOSIS — F1310 Sedative, hypnotic or anxiolytic abuse, uncomplicated: Secondary | ICD-10-CM | POA: Insufficient documentation

## 2015-01-30 DIAGNOSIS — Z720 Tobacco use: Secondary | ICD-10-CM | POA: Diagnosis not present

## 2015-01-30 DIAGNOSIS — Z8719 Personal history of other diseases of the digestive system: Secondary | ICD-10-CM | POA: Diagnosis not present

## 2015-01-30 DIAGNOSIS — Z87442 Personal history of urinary calculi: Secondary | ICD-10-CM | POA: Insufficient documentation

## 2015-01-30 DIAGNOSIS — Z79899 Other long term (current) drug therapy: Secondary | ICD-10-CM | POA: Diagnosis not present

## 2015-01-30 DIAGNOSIS — N72 Inflammatory disease of cervix uteri: Secondary | ICD-10-CM | POA: Diagnosis not present

## 2015-01-30 DIAGNOSIS — Z8709 Personal history of other diseases of the respiratory system: Secondary | ICD-10-CM | POA: Insufficient documentation

## 2015-01-30 DIAGNOSIS — F319 Bipolar disorder, unspecified: Secondary | ICD-10-CM | POA: Insufficient documentation

## 2015-01-30 DIAGNOSIS — K219 Gastro-esophageal reflux disease without esophagitis: Secondary | ICD-10-CM | POA: Diagnosis not present

## 2015-01-30 DIAGNOSIS — Z87440 Personal history of urinary (tract) infections: Secondary | ICD-10-CM | POA: Diagnosis not present

## 2015-01-30 DIAGNOSIS — F419 Anxiety disorder, unspecified: Secondary | ICD-10-CM | POA: Diagnosis not present

## 2015-01-30 DIAGNOSIS — R1030 Lower abdominal pain, unspecified: Secondary | ICD-10-CM | POA: Diagnosis present

## 2015-01-30 DIAGNOSIS — Z8639 Personal history of other endocrine, nutritional and metabolic disease: Secondary | ICD-10-CM | POA: Diagnosis not present

## 2015-01-30 LAB — URINE CULTURE

## 2015-01-30 NOTE — ED Notes (Addendum)
Pt was seen 2 days ago for pain that starts at her umbilicus and radiates to her RLQ. States she does not feel better but was not sent home w/ medications. Alert and oriented.

## 2015-01-31 ENCOUNTER — Emergency Department (HOSPITAL_COMMUNITY)
Admission: EM | Admit: 2015-01-31 | Discharge: 2015-01-31 | Disposition: A | Discharge status: Home / Self Care | Payer: Medicaid Other | Attending: Emergency Medicine | Admitting: Emergency Medicine

## 2015-01-31 ENCOUNTER — Encounter (HOSPITAL_COMMUNITY): Payer: Self-pay | Admitting: Emergency Medicine

## 2015-01-31 DIAGNOSIS — N72 Inflammatory disease of cervix uteri: Secondary | ICD-10-CM

## 2015-01-31 LAB — WET PREP, GENITAL
TRICH WET PREP: NONE SEEN
Yeast Wet Prep HPF POC: NONE SEEN

## 2015-01-31 LAB — URINALYSIS, ROUTINE W REFLEX MICROSCOPIC
BILIRUBIN URINE: NEGATIVE
Glucose, UA: NEGATIVE mg/dL
HGB URINE DIPSTICK: NEGATIVE
Ketones, ur: NEGATIVE mg/dL
Leukocytes, UA: NEGATIVE
Nitrite: NEGATIVE
PH: 7 (ref 5.0–8.0)
Protein, ur: NEGATIVE mg/dL
Specific Gravity, Urine: 1.004 — ABNORMAL LOW (ref 1.005–1.030)
UROBILINOGEN UA: 1 mg/dL (ref 0.0–1.0)

## 2015-01-31 LAB — GC/CHLAMYDIA PROBE AMP (~~LOC~~) NOT AT ARMC
CHLAMYDIA, DNA PROBE: NEGATIVE
Neisseria Gonorrhea: NEGATIVE

## 2015-01-31 LAB — RAPID URINE DRUG SCREEN, HOSP PERFORMED
Amphetamines: NOT DETECTED
Barbiturates: NOT DETECTED
Benzodiazepines: POSITIVE — AB
Cocaine: NOT DETECTED
OPIATES: NOT DETECTED
Tetrahydrocannabinol: NOT DETECTED

## 2015-01-31 MED ORDER — CEFTRIAXONE SODIUM 250 MG IJ SOLR
250.0000 mg | Freq: Once | INTRAMUSCULAR | Status: AC
  Administered 2015-01-31: 250 mg via INTRAMUSCULAR
  Filled 2015-01-31: qty 250

## 2015-01-31 MED ORDER — DOXYCYCLINE HYCLATE 100 MG PO CAPS: 100.0000 mg | ORAL_CAPSULE | Freq: Two times a day (BID) | ORAL | Status: DC

## 2015-01-31 MED ORDER — KETOROLAC TROMETHAMINE 60 MG/2ML IM SOLN
60.0000 mg | Freq: Once | INTRAMUSCULAR | Status: AC
  Administered 2015-01-31: 60 mg via INTRAMUSCULAR
  Filled 2015-01-31: qty 2

## 2015-01-31 MED ORDER — AZITHROMYCIN 1 G PO PACK
1.0000 g | PACK | Freq: Once | ORAL | Status: AC
  Administered 2015-01-31: 1 g via ORAL
  Filled 2015-01-31: qty 1

## 2015-01-31 MED ORDER — DICYCLOMINE HCL 10 MG/ML IM SOLN
20.0000 mg | Freq: Once | INTRAMUSCULAR | Status: AC
  Administered 2015-01-31: 20 mg via INTRAMUSCULAR
  Filled 2015-01-31: qty 2

## 2015-01-31 MED ORDER — OXYCODONE-ACETAMINOPHEN 5-325 MG PO TABS
2.0000 | ORAL_TABLET | Freq: Once | ORAL | Status: AC
  Administered 2015-01-31: 2 via ORAL
  Filled 2015-01-31: qty 2

## 2015-01-31 MED ORDER — MELOXICAM 7.5 MG PO TABS: 7.5000 mg | ORAL_TABLET | Freq: Every day | ORAL | Status: DC

## 2015-01-31 MED ORDER — STERILE WATER FOR INJECTION IJ SOLN
INTRAMUSCULAR | Status: AC
  Filled 2015-01-31: qty 10

## 2015-01-31 NOTE — ED Notes (Signed)
Pt keeps requesting pain medication. EDP made aware of same.

## 2015-01-31 NOTE — ED Notes (Signed)
Pt requesting something additional for pain. Informed pt IM medications can take up to one hour for affect. Notified EDP, Palumbo of same. No new orders at this time.

## 2015-01-31 NOTE — ED Notes (Signed)
Bed: ZO10WA05 Expected date:  Expected time:  Means of arrival:  Comments: EMS 39 yo female lower right abdominal pain-here 2 days ago for same
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
## 2015-01-31 NOTE — Discharge Instructions (Signed)
Pelvic Inflammatory Disease °Pelvic inflammatory disease (PID) refers to an infection in some or all of the female organs. The infection can be in the uterus, ovaries, fallopian tubes, or the surrounding tissues in the pelvis. PID can cause abdominal or pelvic pain that comes on suddenly (acute pelvic pain). PID is a serious infection because it can lead to lasting (chronic) pelvic pain or the inability to have children (infertile).  °CAUSES  °The infection is often caused by the normal bacteria found in the vaginal tissues. PID may also be caused by an infection that is spread during sexual contact. PID can also occur following:  °· The birth of a baby.   °· A miscarriage.   °· An abortion.   °· Major pelvic surgery.   °· The use of an intrauterine device (IUD).   °· A sexual assault.   °RISK FACTORS °Certain factors can put a person at higher risk for PID, such as: °· Being younger than 25 years. °· Being sexually active at a young age. °· Using nonbarrier contraception. °· Having multiple sexual partners. °· Having sex with someone who has symptoms of a genital infection. °· Using oral contraception. °Other times, certain behaviors can increase the possibility of getting PID, such as: °· Having sex during your period. °· Using a vaginal douche. °· Having an intrauterine device (IUD) in place. °SYMPTOMS  °· Abdominal or pelvic pain.   °· Fever.   °· Chills.   °· Abnormal vaginal discharge. °· Abnormal uterine bleeding.   °· Unusual pain shortly after finishing your period. °DIAGNOSIS  °Your caregiver will choose some of the following methods to make a diagnosis, such as:  °· Performing a physical exam and history. A pelvic exam typically reveals a very tender uterus and surrounding pelvis.   °· Ordering laboratory tests including a pregnancy test, blood tests, and urine test.  °· Ordering cultures of the vagina and cervix to check for a sexually transmitted infection (STI). °· Performing an ultrasound.    °· Performing a laparoscopic procedure to look inside the pelvis.   °TREATMENT  °· Antibiotic medicines may be prescribed and taken by mouth.   °· Sexual partners may be treated when the infection is caused by a sexually transmitted disease (STD).   °· Hospitalization may be needed to give antibiotics intravenously. °· Surgery may be needed, but this is rare. °It may take weeks until you are completely well. If you are diagnosed with PID, you should also be checked for human immunodeficiency virus (HIV).   °HOME CARE INSTRUCTIONS  °· If given, take your antibiotics as directed. Finish the medicine even if you start to feel better.   °· Only take over-the-counter or prescription medicines for pain, discomfort, or fever as directed by your caregiver.   °· Do not have sexual intercourse until treatment is completed or as directed by your caregiver. If PID is confirmed, your recent sexual partner(s) will need treatment.   °· Keep your follow-up appointments. °SEEK MEDICAL CARE IF:  °· You have increased or abnormal vaginal discharge.   °· You need prescription medicine for your pain.   °· You vomit.   °· You cannot take your medicines.   °· Your partner has an STD.   °SEEK IMMEDIATE MEDICAL CARE IF:  °· You have a fever.   °· You have increased abdominal or pelvic pain.   °· You have chills.   °· You have pain when you urinate.   °· You are not better after 72 hours following treatment.   °MAKE SURE YOU:  °· Understand these instructions. °· Will watch your condition. °· Will get help right away if you are not doing well or get worse. °  Document Released: 07/05/2005 Document Revised: 10/30/2012 Document Reviewed: 07/01/2011 °[employer]® Patient Information ©2015 [employer]. This information is not intended to replace advice given to you by your health care provider. Make sure you discuss any questions you have with your health care provider. ° °

## 2015-02-10 ENCOUNTER — Emergency Department (HOSPITAL_COMMUNITY): Payer: Medicaid Other

## 2015-02-10 ENCOUNTER — Emergency Department (HOSPITAL_COMMUNITY)
Admission: EM | Admit: 2015-02-10 | Discharge: 2015-02-10 | Disposition: A | Discharge status: Home / Self Care | Payer: Medicaid Other | Attending: Emergency Medicine | Admitting: Emergency Medicine

## 2015-02-10 ENCOUNTER — Encounter (HOSPITAL_COMMUNITY): Payer: Self-pay | Admitting: Family Medicine

## 2015-02-10 DIAGNOSIS — E785 Hyperlipidemia, unspecified: Secondary | ICD-10-CM | POA: Diagnosis not present

## 2015-02-10 DIAGNOSIS — Z720 Tobacco use: Secondary | ICD-10-CM | POA: Insufficient documentation

## 2015-02-10 DIAGNOSIS — R0602 Shortness of breath: Secondary | ICD-10-CM | POA: Insufficient documentation

## 2015-02-10 DIAGNOSIS — Z8742 Personal history of other diseases of the female genital tract: Secondary | ICD-10-CM | POA: Diagnosis not present

## 2015-02-10 DIAGNOSIS — R197 Diarrhea, unspecified: Secondary | ICD-10-CM | POA: Insufficient documentation

## 2015-02-10 DIAGNOSIS — I456 Pre-excitation syndrome: Secondary | ICD-10-CM | POA: Insufficient documentation

## 2015-02-10 DIAGNOSIS — M545 Low back pain: Secondary | ICD-10-CM | POA: Insufficient documentation

## 2015-02-10 DIAGNOSIS — R9431 Abnormal electrocardiogram [ECG] [EKG]: Secondary | ICD-10-CM

## 2015-02-10 DIAGNOSIS — R079 Chest pain, unspecified: Secondary | ICD-10-CM | POA: Insufficient documentation

## 2015-02-10 DIAGNOSIS — F419 Anxiety disorder, unspecified: Secondary | ICD-10-CM | POA: Diagnosis not present

## 2015-02-10 DIAGNOSIS — F319 Bipolar disorder, unspecified: Secondary | ICD-10-CM | POA: Insufficient documentation

## 2015-02-10 DIAGNOSIS — K219 Gastro-esophageal reflux disease without esophagitis: Secondary | ICD-10-CM | POA: Insufficient documentation

## 2015-02-10 DIAGNOSIS — J45909 Unspecified asthma, uncomplicated: Secondary | ICD-10-CM | POA: Diagnosis not present

## 2015-02-10 DIAGNOSIS — Z87440 Personal history of urinary (tract) infections: Secondary | ICD-10-CM | POA: Diagnosis not present

## 2015-02-10 LAB — BASIC METABOLIC PANEL
Anion gap: 6 (ref 5–15)
BUN: <5 mg/dL — ABNORMAL LOW (ref 6–20)
CO2: 24 mmol/L (ref 22–32)
CREATININE: 0.68 mg/dL (ref 0.44–1.00)
Calcium: 8.9 mg/dL (ref 8.9–10.3)
Chloride: 108 mmol/L (ref 101–111)
GFR calc non Af Amer: >60 mL/min (ref >60)
Glucose, Bld: 96 mg/dL (ref 65–99)
Potassium: 3.6 mmol/L (ref 3.5–5.1)
Sodium: 138 mmol/L (ref 135–145)

## 2015-02-10 LAB — CBC
HCT: 39.1 % (ref 36.0–46.0)
Hemoglobin: 13.3 g/dL (ref 12.0–15.0)
MCH: 29.4 pg (ref 26.0–34.0)
MCHC: 34 g/dL (ref 30.0–36.0)
MCV: 86.3 fL (ref 78.0–100.0)
Platelets: 284 10*3/uL (ref 150–400)
RBC: 4.53 MIL/uL (ref 3.87–5.11)
RDW: 13.9 % (ref 11.5–15.5)
WBC: 6.8 10*3/uL (ref 4.0–10.5)

## 2015-02-10 LAB — I-STAT TROPONIN, ED: TROPONIN I, POC: 0 ng/mL (ref 0.00–0.08)

## 2015-02-10 MED ORDER — OXYCODONE-ACETAMINOPHEN 5-325 MG PO TABS
2.0000 | ORAL_TABLET | Freq: Once | ORAL | Status: AC
  Administered 2015-02-10: 2 via ORAL
  Filled 2015-02-10: qty 2

## 2015-02-10 NOTE — ED Notes (Signed)
Pt here for chest pain that woke her up at 2 am. sts some SOB.
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
## 2015-02-10 NOTE — Discharge Instructions (Signed)
If you were given medicines take as directed.  If you are on coumadin or contraceptives realize their levels and effectiveness is altered by many different medicines.  If you have any reaction (rash, tongues swelling, other) to the medicines stop taking and see a physician.    If your blood pressure was elevated in the ER make sure you follow up for management with a primary doctor or return for chest pain, shortness of breath or stroke symptoms.  Please follow up as directed and return to the ER or see a physician for new or worsening symptoms.  Thank you. Filed Vitals:   02/10/15 1008 02/10/15 1015 02/10/15 1030 02/10/15 1045  BP: 101/43 108/60 113/71 111/70  Pulse: 65 61 67 62  Temp:      TempSrc:      Resp: SpO2: 97% 96% 99% 99%    Adenosine Stress Electrocardiography An adenosine stress electrocardiography is a test used to detect heart disease (coronary artery disease). Adenosine is a medicine that makes the heart arteries react as if you are exercising. Adenosine is given with a radioactive tracer. The "tracer" is a safe radioactive substance that travels in the bloodstream to the heart arteries. Special imaging cameras detect the tracer and help find blocked arteries in the heart. This test may be done with or without treadmill exercise.  LET [facility] CARE PROVIDER KNOW ABOUT:  Allergies, including latex allergies.  All prescription medicines you taking as well as all non-prescription and over-the-counter medicines, including herbs and vitamins.  Use of steroids (by mouth or creams).  Previous problems with anesthetics or novocaine.  History of blood clots or bleeding problems.  Previous surgery.  Other health problems such as kidney or lung conditions.  Possibility of pregnancy, if this applies. RISKS AND COMPLICATIONS You may develop chest discomfort, shortness of breath, sweating, or light-headedness during the test. On rare occasions, you could  experience a heart attack or your heart may go into a very fast or irregular rhythm. This could cause you to collapse. To ensure your safety, your health care provider will supervise the test. Your blood pressure and electrocardiogram are constantly watched. The test team watches for and is able to treat any problems. BEFORE THE PROCEDURE  Do not eat or drink caffeine for 12 to 24 hours before the test. This includes all caffeinated beverages and food, such as pop, coffee (roasted, instant, decaffeinated roasted, decaffeinated instant), hot chocolate, tea, and all chocolate.  Do not smoke on the day of your test. Smoking on the day of your test may change your test results.  Do not eat anything 3 hours before the test or as recommended by your health care provider. Eating may cause an unclear image and may also cause nausea. If you are diabetic, talk to your health care provider regarding your insulin coverage.  Bring a list of all the medicines you are taking. Take your medicine as usual before the test except as told by the testing center.  Wear comfortable clothing, such as a short-sleeve shirt and sweatpants. Do not wear an underwire bra or jewelry. A hospital gown can be provided.  Shower before your appointment to reduce the spread of bacteria.  You may want to bring a book to read because there are some waiting periods during the test.  Your health care provider will go over the adenosine stress test with you, such as procedure protocol, what to expect, how long it will take, and results.  PROCEDURE   An IV will be started in a vein in your hand or arm.  Electrode patches will be placed on your chest. The electrodes are connected to a monitor so your heart rhythm and heart rate can be watched. Your blood pressure will also be monitored during the test.  Two sets of images are usually taken of your heart. The images compare your heart at rest and when it is "stressed." This first image  is a "resting" picture of your heart. The "resting" image is usually done before adenosine is given.  Adenosine is given in the IV over a period of 4 to 6 minutes.  After the adenosine is given, you will be monitored for a few minutes afterward to ensure your heart rate, heart rhythm, and blood pressure are normal. AFTER THE PROCEDURE  When your test is completed, you may be asked to schedule an office visit with your health care provider to discuss the test results, or your health care provider may choose to call you with the results.  Document Released: 09/12/2006 Document Revised: 11/19/2013 Document Reviewed: 10/20/2011 [facility] Patient Information 2015 [place], [place]. This information is not intended to replace advice given to you by your health care provider. Make sure you discuss any questions you have with your health care provider.  Chest Pain (Nonspecific) It is often hard to give a diagnosis for the cause of chest pain. There is always a chance that your pain could be related to something serious, such as a heart attack or a blood clot in the lungs. You need to follow up with your doctor. HOME CARE  If antibiotic medicine was given, take it as directed by your doctor. Finish the medicine even if you start to feel better.  For the next few days, avoid activities that bring on chest pain. Continue physical activities as told by your doctor.  Do not use any tobacco products. This includes cigarettes, chewing tobacco, and e-cigarettes.  Avoid drinking alcohol.  Only take medicine as told by your doctor.  Follow your doctor's suggestions for more testing if your chest pain does not go away.  Keep all doctor visits you made. GET HELP IF:  Your chest pain does not go away, even after treatment.  You have a rash with blisters on your chest.  You have a fever. GET HELP RIGHT AWAY IF:   You have more pain or pain that spreads to your arm, neck, jaw, back, or belly  (abdomen).  You have shortness of breath.  You cough more than usual or cough up blood.  You have very bad back or belly pain.  You feel sick to your stomach (nauseous) or throw up (vomit).  You have very bad weakness.  You pass out (faint).  You have chills. This is an emergency. Do not wait to see if the problems will go away. Call your local emergency services (911 in [place] Do not drive yourself to the hospital. MAKE SURE YOU:   Understand these instructions.  Will watch your condition.  Will get help right away if you are not doing well or get worse. Document Released: 12/22/2007 Document Revised: 07/10/2013 Document Reviewed: 12/22/2007 [facility] Patient Information 2015 [place], [place]. This information is not intended to replace advice given to you by your health care provider. Make sure you discuss any questions you have with your health care provider.

## 2015-03-04 ENCOUNTER — Emergency Department (HOSPITAL_COMMUNITY)
Admission: EM | Admit: 2015-03-04 | Discharge: 2015-03-04 | Disposition: A | Discharge status: Home / Self Care | Payer: Medicaid Other | Attending: Emergency Medicine | Admitting: Emergency Medicine

## 2015-03-04 ENCOUNTER — Emergency Department (HOSPITAL_COMMUNITY): Payer: Medicaid Other

## 2015-03-04 ENCOUNTER — Encounter (HOSPITAL_COMMUNITY): Payer: Self-pay | Admitting: Emergency Medicine

## 2015-03-04 DIAGNOSIS — K219 Gastro-esophageal reflux disease without esophagitis: Secondary | ICD-10-CM | POA: Diagnosis not present

## 2015-03-04 DIAGNOSIS — Z87440 Personal history of urinary (tract) infections: Secondary | ICD-10-CM | POA: Insufficient documentation

## 2015-03-04 DIAGNOSIS — Z87448 Personal history of other diseases of urinary system: Secondary | ICD-10-CM | POA: Insufficient documentation

## 2015-03-04 DIAGNOSIS — Z720 Tobacco use: Secondary | ICD-10-CM | POA: Insufficient documentation

## 2015-03-04 DIAGNOSIS — J42 Unspecified chronic bronchitis: Secondary | ICD-10-CM | POA: Diagnosis not present

## 2015-03-04 DIAGNOSIS — Z79899 Other long term (current) drug therapy: Secondary | ICD-10-CM | POA: Diagnosis not present

## 2015-03-04 DIAGNOSIS — F329 Major depressive disorder, single episode, unspecified: Secondary | ICD-10-CM | POA: Insufficient documentation

## 2015-03-04 DIAGNOSIS — J209 Acute bronchitis, unspecified: Secondary | ICD-10-CM

## 2015-03-04 DIAGNOSIS — Z8639 Personal history of other endocrine, nutritional and metabolic disease: Secondary | ICD-10-CM | POA: Insufficient documentation

## 2015-03-04 DIAGNOSIS — R05 Cough: Secondary | ICD-10-CM | POA: Diagnosis present

## 2015-03-04 DIAGNOSIS — Z8742 Personal history of other diseases of the female genital tract: Secondary | ICD-10-CM | POA: Diagnosis not present

## 2015-03-04 DIAGNOSIS — F419 Anxiety disorder, unspecified: Secondary | ICD-10-CM | POA: Insufficient documentation

## 2015-03-04 MED ORDER — AZITHROMYCIN 250 MG PO TABS: 250.0000 mg | ORAL_TABLET | Freq: Every day | ORAL | Status: DC

## 2015-03-04 MED ORDER — GUAIFENESIN 100 MG/5ML PO LIQD: 100.0000 mg | ORAL | Status: DC | PRN

## 2015-03-04 NOTE — ED Notes (Signed)
PA Laveda Norman in room at this time

## 2015-03-04 NOTE — ED Notes (Signed)
Patient transported to X-ray 

## 2015-03-04 NOTE — ED Notes (Signed)
Pt reports productive cough for the past few days as well as some sob. Pt unsure if she has had any fevers at home. Has taken otc meds with no relief.

## 2015-03-04 NOTE — Discharge Instructions (Signed)
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
## 2015-03-29 ENCOUNTER — Encounter (HOSPITAL_COMMUNITY): Payer: Self-pay | Admitting: Emergency Medicine

## 2015-03-29 ENCOUNTER — Emergency Department (HOSPITAL_COMMUNITY)
Admission: EM | Admit: 2015-03-29 | Discharge: 2015-03-29 | Disposition: A | Discharge status: Home / Self Care | Payer: Medicaid Other | Source: Home / Self Care | Attending: Family Medicine | Admitting: Family Medicine

## 2015-03-29 DIAGNOSIS — K141 Geographic tongue: Secondary | ICD-10-CM | POA: Diagnosis not present

## 2015-03-29 DIAGNOSIS — A084 Viral intestinal infection, unspecified: Secondary | ICD-10-CM

## 2015-03-29 LAB — POCT RAPID STREP A: STREPTOCOCCUS, GROUP A SCREEN (DIRECT): NEGATIVE

## 2015-03-29 MED ORDER — MAGIC MOUTHWASH: 10.0000 mL | Freq: Three times a day (TID) | ORAL | Status: DC
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
## 2015-03-29 NOTE — Discharge Instructions (Signed)
Use medicine , buttermilk and yogurt and plenty of water . See your doctor as needed.

## 2015-03-29 NOTE — ED Notes (Signed)
C/o ST associated w/odynophagia and chills onset yest Alert and oriented x4... No acute distress.

## 2015-03-31 LAB — CULTURE, GROUP A STREP: Strep A Culture: NEGATIVE

## 2015-04-01 NOTE — ED Notes (Signed)
Final report of strep negative, no further action required 

## 2015-05-07 ENCOUNTER — Emergency Department (HOSPITAL_COMMUNITY): Payer: Medicaid Other

## 2015-05-07 ENCOUNTER — Encounter (HOSPITAL_COMMUNITY): Payer: Self-pay | Admitting: Emergency Medicine

## 2015-05-07 ENCOUNTER — Emergency Department (HOSPITAL_COMMUNITY)
Admission: EM | Admit: 2015-05-07 | Discharge: 2015-05-07 | Disposition: A | Discharge status: Home / Self Care | Payer: Medicaid Other | Attending: Emergency Medicine | Admitting: Emergency Medicine

## 2015-05-07 DIAGNOSIS — Z87442 Personal history of urinary calculi: Secondary | ICD-10-CM | POA: Diagnosis not present

## 2015-05-07 DIAGNOSIS — M545 Low back pain: Secondary | ICD-10-CM | POA: Insufficient documentation

## 2015-05-07 DIAGNOSIS — F319 Bipolar disorder, unspecified: Secondary | ICD-10-CM | POA: Diagnosis not present

## 2015-05-07 DIAGNOSIS — Z87448 Personal history of other diseases of urinary system: Secondary | ICD-10-CM | POA: Diagnosis not present

## 2015-05-07 DIAGNOSIS — Z8742 Personal history of other diseases of the female genital tract: Secondary | ICD-10-CM | POA: Diagnosis not present

## 2015-05-07 DIAGNOSIS — Z79899 Other long term (current) drug therapy: Secondary | ICD-10-CM | POA: Insufficient documentation

## 2015-05-07 DIAGNOSIS — Z3202 Encounter for pregnancy test, result negative: Secondary | ICD-10-CM | POA: Insufficient documentation

## 2015-05-07 DIAGNOSIS — Z8709 Personal history of other diseases of the respiratory system: Secondary | ICD-10-CM | POA: Diagnosis not present

## 2015-05-07 DIAGNOSIS — Z87440 Personal history of urinary (tract) infections: Secondary | ICD-10-CM | POA: Diagnosis not present

## 2015-05-07 DIAGNOSIS — R1031 Right lower quadrant pain: Secondary | ICD-10-CM | POA: Diagnosis not present

## 2015-05-07 DIAGNOSIS — K219 Gastro-esophageal reflux disease without esophagitis: Secondary | ICD-10-CM | POA: Diagnosis not present

## 2015-05-07 DIAGNOSIS — R350 Frequency of micturition: Secondary | ICD-10-CM | POA: Insufficient documentation

## 2015-05-07 DIAGNOSIS — Z720 Tobacco use: Secondary | ICD-10-CM | POA: Diagnosis not present

## 2015-05-07 DIAGNOSIS — F419 Anxiety disorder, unspecified: Secondary | ICD-10-CM | POA: Insufficient documentation

## 2015-05-07 DIAGNOSIS — R112 Nausea with vomiting, unspecified: Secondary | ICD-10-CM | POA: Diagnosis not present

## 2015-05-07 DIAGNOSIS — Z9049 Acquired absence of other specified parts of digestive tract: Secondary | ICD-10-CM | POA: Diagnosis not present

## 2015-05-07 DIAGNOSIS — R509 Fever, unspecified: Secondary | ICD-10-CM | POA: Diagnosis not present

## 2015-05-07 DIAGNOSIS — R300 Dysuria: Secondary | ICD-10-CM | POA: Diagnosis present

## 2015-05-07 DIAGNOSIS — R109 Unspecified abdominal pain: Secondary | ICD-10-CM

## 2015-05-07 LAB — CBC WITH DIFFERENTIAL/PLATELET
BASOS ABS: 0 10*3/uL (ref 0.0–0.1)
Basophils Relative: 0 %
Eosinophils Absolute: 0.2 10*3/uL (ref 0.0–0.7)
Eosinophils Relative: 3 %
HEMATOCRIT: 40.6 % (ref 36.0–46.0)
Hemoglobin: 13.5 g/dL (ref 12.0–15.0)
LYMPHS ABS: 1.5 10*3/uL (ref 0.7–4.0)
LYMPHS PCT: 17 %
MCH: 28.6 pg (ref 26.0–34.0)
MCHC: 33.3 g/dL (ref 30.0–36.0)
MCV: 86 fL (ref 78.0–100.0)
MONO ABS: 0.4 10*3/uL (ref 0.1–1.0)
MONOS PCT: 4 %
NEUTROS ABS: 6.5 10*3/uL (ref 1.7–7.7)
Neutrophils Relative %: 76 %
Platelets: 270 10*3/uL (ref 150–400)
RBC: 4.72 MIL/uL (ref 3.87–5.11)
RDW: 13.4 % (ref 11.5–15.5)
WBC: 8.6 10*3/uL (ref 4.0–10.5)

## 2015-05-07 LAB — URINALYSIS, ROUTINE W REFLEX MICROSCOPIC
GLUCOSE, UA: NEGATIVE mg/dL
HGB URINE DIPSTICK: NEGATIVE
Ketones, ur: NEGATIVE mg/dL
Nitrite: NEGATIVE
Protein, ur: NEGATIVE mg/dL
Specific Gravity, Urine: 1.023 (ref 1.005–1.030)
Urobilinogen, UA: 1 mg/dL (ref 0.0–1.0)
pH: 6 (ref 5.0–8.0)

## 2015-05-07 LAB — COMPREHENSIVE METABOLIC PANEL
ALT: 64 U/L — ABNORMAL HIGH (ref 14–54)
AST: 60 U/L — ABNORMAL HIGH (ref 15–41)
Albumin: 3.5 g/dL (ref 3.5–5.0)
Alkaline Phosphatase: 66 U/L (ref 38–126)
Anion gap: 7 (ref 5–15)
BILIRUBIN TOTAL: 0.7 mg/dL (ref 0.3–1.2)
BUN: 6 mg/dL (ref 6–20)
CO2: 29 mmol/L (ref 22–32)
Calcium: 8.9 mg/dL (ref 8.9–10.3)
Chloride: 103 mmol/L (ref 101–111)
Creatinine, Ser: 0.59 mg/dL (ref 0.44–1.00)
Glucose, Bld: 105 mg/dL — ABNORMAL HIGH (ref 65–99)
POTASSIUM: 4 mmol/L (ref 3.5–5.1)
Sodium: 139 mmol/L (ref 135–145)
TOTAL PROTEIN: 7.2 g/dL (ref 6.5–8.1)

## 2015-05-07 LAB — URINE MICROSCOPIC-ADD ON

## 2015-05-07 LAB — LIPASE, BLOOD: LIPASE: 23 U/L (ref 22–51)

## 2015-05-07 LAB — POC URINE PREG, ED: Preg Test, Ur: NEGATIVE

## 2015-05-07 MED ORDER — IBUPROFEN 800 MG PO TABS
800.0000 mg | ORAL_TABLET | Freq: Three times a day (TID) | ORAL | Status: DC
Start: 2015-05-07 — End: 2015-11-11

## 2015-05-07 MED ORDER — ONDANSETRON 8 MG PO TBDP
8.0000 mg | ORAL_TABLET | Freq: Once | ORAL | Status: AC
  Administered 2015-05-07: 8 mg via ORAL
  Filled 2015-05-07: qty 1

## 2015-05-07 MED ORDER — ONDANSETRON 4 MG PO TBDP: 4.0000 mg | ORAL_TABLET | Freq: Three times a day (TID) | ORAL | Status: DC | PRN

## 2015-05-07 MED ORDER — HYDROCODONE-ACETAMINOPHEN 5-325 MG PO TABS
1.0000 | ORAL_TABLET | Freq: Once | ORAL | Status: AC
  Administered 2015-05-07: 1 via ORAL
  Filled 2015-05-07: qty 1
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
## 2015-05-07 NOTE — Discharge Instructions (Signed)
Take your medications as prescribed for nausea and pain relief. Follow-up with your primary care provider in the next 3-4 days. Return to the emergency department if symptoms worsen or new onset of fever, vomiting blood, blood in urine, blood in stool, worsening abdominal pain.

## 2015-05-07 NOTE — ED Notes (Signed)
Patient c/o burning with urination and low right sided back pain. Reports fever at home 100.X, took 600mg  ibuprofen.

## 2015-09-22 ENCOUNTER — Encounter (HOSPITAL_COMMUNITY): Payer: Self-pay

## 2015-09-22 ENCOUNTER — Emergency Department (HOSPITAL_COMMUNITY)
Admission: EM | Admit: 2015-09-22 | Discharge: 2015-09-22 | Disposition: A | Discharge status: Home / Self Care | Payer: Medicaid Other | Attending: Emergency Medicine | Admitting: Emergency Medicine

## 2015-09-22 DIAGNOSIS — K219 Gastro-esophageal reflux disease without esophagitis: Secondary | ICD-10-CM | POA: Insufficient documentation

## 2015-09-22 DIAGNOSIS — Y9289 Other specified places as the place of occurrence of the external cause: Secondary | ICD-10-CM | POA: Insufficient documentation

## 2015-09-22 DIAGNOSIS — Z791 Long term (current) use of non-steroidal anti-inflammatories (NSAID): Secondary | ICD-10-CM | POA: Diagnosis not present

## 2015-09-22 DIAGNOSIS — S3992XA Unspecified injury of lower back, initial encounter: Secondary | ICD-10-CM | POA: Insufficient documentation

## 2015-09-22 DIAGNOSIS — Y9389 Activity, other specified: Secondary | ICD-10-CM | POA: Diagnosis not present

## 2015-09-22 DIAGNOSIS — Z87448 Personal history of other diseases of urinary system: Secondary | ICD-10-CM | POA: Insufficient documentation

## 2015-09-22 DIAGNOSIS — W01198A Fall on same level from slipping, tripping and stumbling with subsequent striking against other object, initial encounter: Secondary | ICD-10-CM | POA: Insufficient documentation

## 2015-09-22 DIAGNOSIS — Z8639 Personal history of other endocrine, nutritional and metabolic disease: Secondary | ICD-10-CM | POA: Insufficient documentation

## 2015-09-22 DIAGNOSIS — Y998 Other external cause status: Secondary | ICD-10-CM | POA: Diagnosis not present

## 2015-09-22 DIAGNOSIS — Z79899 Other long term (current) drug therapy: Secondary | ICD-10-CM | POA: Insufficient documentation

## 2015-09-22 DIAGNOSIS — F419 Anxiety disorder, unspecified: Secondary | ICD-10-CM | POA: Diagnosis not present

## 2015-09-22 DIAGNOSIS — F17210 Nicotine dependence, cigarettes, uncomplicated: Secondary | ICD-10-CM | POA: Insufficient documentation

## 2015-09-22 DIAGNOSIS — F319 Bipolar disorder, unspecified: Secondary | ICD-10-CM | POA: Diagnosis not present

## 2015-09-22 DIAGNOSIS — S199XXA Unspecified injury of neck, initial encounter: Secondary | ICD-10-CM | POA: Diagnosis not present
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
## 2015-09-22 NOTE — ED Notes (Signed)
Declined W/C at D/C and was escorted to lobby by RN. 
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
## 2015-10-27 DIAGNOSIS — Z8709 Personal history of other diseases of the respiratory system: Secondary | ICD-10-CM | POA: Insufficient documentation

## 2015-10-27 DIAGNOSIS — Z8742 Personal history of other diseases of the female genital tract: Secondary | ICD-10-CM | POA: Diagnosis not present

## 2015-10-27 DIAGNOSIS — F17210 Nicotine dependence, cigarettes, uncomplicated: Secondary | ICD-10-CM | POA: Insufficient documentation

## 2015-10-27 DIAGNOSIS — K219 Gastro-esophageal reflux disease without esophagitis: Secondary | ICD-10-CM | POA: Insufficient documentation

## 2015-10-27 DIAGNOSIS — F419 Anxiety disorder, unspecified: Secondary | ICD-10-CM | POA: Insufficient documentation

## 2015-10-27 DIAGNOSIS — G43809 Other migraine, not intractable, without status migrainosus: Secondary | ICD-10-CM | POA: Insufficient documentation

## 2015-10-27 DIAGNOSIS — Z87440 Personal history of urinary (tract) infections: Secondary | ICD-10-CM | POA: Insufficient documentation

## 2015-10-27 DIAGNOSIS — Z8639 Personal history of other endocrine, nutritional and metabolic disease: Secondary | ICD-10-CM | POA: Diagnosis not present

## 2015-10-27 DIAGNOSIS — F319 Bipolar disorder, unspecified: Secondary | ICD-10-CM | POA: Insufficient documentation

## 2015-10-27 DIAGNOSIS — Z87442 Personal history of urinary calculi: Secondary | ICD-10-CM | POA: Diagnosis not present

## 2015-10-27 DIAGNOSIS — R51 Headache: Secondary | ICD-10-CM | POA: Diagnosis present

## 2015-10-28 ENCOUNTER — Encounter (HOSPITAL_COMMUNITY): Payer: Self-pay | Admitting: *Deleted

## 2015-10-28 ENCOUNTER — Emergency Department (HOSPITAL_COMMUNITY)
Admission: EM | Admit: 2015-10-28 | Discharge: 2015-10-28 | Disposition: A | Discharge status: Home / Self Care | Payer: Medicaid Other | Attending: Emergency Medicine | Admitting: Emergency Medicine

## 2015-10-28 DIAGNOSIS — G43809 Other migraine, not intractable, without status migrainosus: Secondary | ICD-10-CM

## 2015-10-28 MED ORDER — DIPHENHYDRAMINE HCL 50 MG/ML IJ SOLN
25.0000 mg | Freq: Once | INTRAMUSCULAR | Status: AC
  Administered 2015-10-28: 25 mg via INTRAMUSCULAR
  Filled 2015-10-28: qty 1

## 2015-10-28 MED ORDER — OXYCODONE-ACETAMINOPHEN 5-325 MG PO TABS
ORAL_TABLET | ORAL | Status: AC
  Filled 2015-10-28: qty 1

## 2015-10-28 MED ORDER — PROCHLORPERAZINE EDISYLATE 5 MG/ML IJ SOLN
10.0000 mg | Freq: Once | INTRAMUSCULAR | Status: AC
  Administered 2015-10-28: 10 mg via INTRAMUSCULAR
  Filled 2015-10-28: qty 2

## 2015-10-28 MED ORDER — KETOROLAC TROMETHAMINE 60 MG/2ML IM SOLN
60.0000 mg | Freq: Once | INTRAMUSCULAR | Status: AC
  Administered 2015-10-28: 60 mg via INTRAMUSCULAR
  Filled 2015-10-28: qty 2

## 2015-10-28 MED ORDER — GI COCKTAIL ~~LOC~~
30.0000 mL | Freq: Once | ORAL | Status: AC
  Administered 2015-10-28: 30 mL via ORAL
  Filled 2015-10-28: qty 30

## 2015-10-28 MED ORDER — OMEPRAZOLE 40 MG PO CPDR: 40.0000 mg | DELAYED_RELEASE_CAPSULE | Freq: Every day | ORAL | Status: DC

## 2015-10-28 MED ORDER — OXYCODONE-ACETAMINOPHEN 5-325 MG PO TABS
1.0000 | ORAL_TABLET | ORAL | Status: DC | PRN
  Administered 2015-10-28: 1 via ORAL

## 2015-10-28 MED ORDER — PROMETHAZINE HCL 25 MG PO TABS: 25.0000 mg | ORAL_TABLET | Freq: Four times a day (QID) | ORAL | Status: DC | PRN
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
## 2015-10-28 NOTE — Discharge Instructions (Signed)

## 2015-10-28 NOTE — ED Notes (Signed)
Pt reports migraine for the past couple of hours. No medicines taken at home to help the pain. Pt states she gets migraines a couple of times a month but this one feels different. Pt unable to tell me what is different about this migraine. Pt reports nausea and photophobia.

## 2015-11-11 ENCOUNTER — Encounter (HOSPITAL_COMMUNITY): Payer: Self-pay | Admitting: Emergency Medicine

## 2015-11-11 ENCOUNTER — Emergency Department (HOSPITAL_COMMUNITY)
Admission: EM | Admit: 2015-11-11 | Discharge: 2015-11-11 | Disposition: A | Discharge status: Home / Self Care | Payer: Medicaid Other | Attending: Emergency Medicine | Admitting: Emergency Medicine

## 2015-11-11 DIAGNOSIS — Z8709 Personal history of other diseases of the respiratory system: Secondary | ICD-10-CM | POA: Diagnosis not present

## 2015-11-11 DIAGNOSIS — F17210 Nicotine dependence, cigarettes, uncomplicated: Secondary | ICD-10-CM | POA: Insufficient documentation

## 2015-11-11 DIAGNOSIS — F319 Bipolar disorder, unspecified: Secondary | ICD-10-CM | POA: Diagnosis not present

## 2015-11-11 DIAGNOSIS — Z87442 Personal history of urinary calculi: Secondary | ICD-10-CM | POA: Insufficient documentation

## 2015-11-11 DIAGNOSIS — Z8742 Personal history of other diseases of the female genital tract: Secondary | ICD-10-CM | POA: Diagnosis not present

## 2015-11-11 DIAGNOSIS — R5383 Other fatigue: Secondary | ICD-10-CM | POA: Insufficient documentation

## 2015-11-11 DIAGNOSIS — R300 Dysuria: Secondary | ICD-10-CM | POA: Diagnosis present

## 2015-11-11 DIAGNOSIS — Z3202 Encounter for pregnancy test, result negative: Secondary | ICD-10-CM | POA: Diagnosis not present

## 2015-11-11 DIAGNOSIS — N390 Urinary tract infection, site not specified: Secondary | ICD-10-CM | POA: Diagnosis not present

## 2015-11-11 DIAGNOSIS — Z79899 Other long term (current) drug therapy: Secondary | ICD-10-CM | POA: Insufficient documentation

## 2015-11-11 DIAGNOSIS — F419 Anxiety disorder, unspecified: Secondary | ICD-10-CM | POA: Diagnosis not present

## 2015-11-11 DIAGNOSIS — K219 Gastro-esophageal reflux disease without esophagitis: Secondary | ICD-10-CM | POA: Insufficient documentation

## 2015-11-11 LAB — CBC
HEMATOCRIT: 42.8 % (ref 36.0–46.0)
HEMOGLOBIN: 14.4 g/dL (ref 12.0–15.0)
MCH: 29.5 pg (ref 26.0–34.0)
MCHC: 33.6 g/dL (ref 30.0–36.0)
MCV: 87.7 fL (ref 78.0–100.0)
Platelets: 342 10*3/uL (ref 150–400)
RBC: 4.88 MIL/uL (ref 3.87–5.11)
RDW: 13.3 % (ref 11.5–15.5)
WBC: 8.9 10*3/uL (ref 4.0–10.5)

## 2015-11-11 LAB — URINALYSIS, ROUTINE W REFLEX MICROSCOPIC
Bilirubin Urine: NEGATIVE
Glucose, UA: NEGATIVE mg/dL
Ketones, ur: NEGATIVE mg/dL
Nitrite: POSITIVE — AB
Protein, ur: 100 mg/dL — AB
Specific Gravity, Urine: 1.01 (ref 1.005–1.030)
pH: 6 (ref 5.0–8.0)

## 2015-11-11 LAB — BASIC METABOLIC PANEL
ANION GAP: 9 (ref 5–15)
BUN: <5 mg/dL — ABNORMAL LOW (ref 6–20)
CALCIUM: 8.8 mg/dL — AB (ref 8.9–10.3)
CO2: 26 mmol/L (ref 22–32)
Chloride: 105 mmol/L (ref 101–111)
Creatinine, Ser: 0.79 mg/dL (ref 0.44–1.00)
GLUCOSE: 113 mg/dL — AB (ref 65–99)
POTASSIUM: 3.4 mmol/L — AB (ref 3.5–5.1)
Sodium: 140 mmol/L (ref 135–145)

## 2015-11-11 LAB — URINE MICROSCOPIC-ADD ON

## 2015-11-11 LAB — POC URINE PREG, ED: Preg Test, Ur: NEGATIVE

## 2015-11-11 MED ORDER — HYDROCODONE-ACETAMINOPHEN 5-325 MG PO TABS
1.0000 | ORAL_TABLET | Freq: Once | ORAL | Status: AC
  Administered 2015-11-11: 1 via ORAL
  Filled 2015-11-11: qty 1

## 2015-11-11 MED ORDER — FLUCONAZOLE 150 MG PO TABS: 150.0000 mg | ORAL_TABLET | Freq: Every day | ORAL | Status: AC

## 2015-11-11 MED ORDER — CEPHALEXIN 500 MG PO CAPS: 500.0000 mg | ORAL_CAPSULE | Freq: Three times a day (TID) | ORAL | Status: DC

## 2015-11-11 MED ORDER — ONDANSETRON 4 MG PO TBDP
8.0000 mg | ORAL_TABLET | Freq: Once | ORAL | Status: AC
  Administered 2015-11-11: 8 mg via ORAL
  Filled 2015-11-11: qty 2

## 2015-11-11 MED ORDER — ONDANSETRON HCL 4 MG PO TABS: 4.0000 mg | ORAL_TABLET | Freq: Four times a day (QID) | ORAL | Status: DC
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
## 2015-11-11 NOTE — ED Notes (Signed)
Pt reports 1 week hx of pain with urination, burning and foul odor. Denies recent fever. VSS.

## 2015-11-11 NOTE — ED Notes (Signed)
Pt reports "I just threw up everything you gave me but I didn't have a bag so I puked in the trash can." No vomitus present in the trash.

## 2015-11-11 NOTE — Discharge Instructions (Signed)
Urinary Tract Infection Urinary tract infections (UTIs) can develop anywhere along your urinary tract. Your urinary tract is your body's drainage system for removing wastes and extra water. Your urinary tract includes two kidneys, two ureters, a bladder, and a urethra. Your kidneys are a pair of bean-shaped organs. Each kidney is about the size of your fist. They are located below your ribs, one on each side of your spine. CAUSES Infections are caused by microbes, which are microscopic organisms, including fungi, viruses, and bacteria. These organisms are so small that they can only be seen through a microscope. Bacteria are the microbes that most commonly cause UTIs. SYMPTOMS  Symptoms of UTIs may vary by age and gender of the patient and by the location of the infection. Symptoms in young women typically include a frequent and intense urge to urinate and a painful, burning feeling in the bladder or urethra during urination. Older women and men are more likely to be tired, shaky, and weak and have muscle aches and abdominal pain. A fever may mean the infection is in your kidneys. Other symptoms of a kidney infection include pain in your back or sides below the ribs, nausea, and vomiting. DIAGNOSIS To diagnose a UTI, your caregiver will ask you about your symptoms. Your caregiver will also ask you to provide a urine sample. The urine sample will be tested for bacteria and white blood cells. White blood cells are made by your body to help fight infection. TREATMENT  Typically, UTIs can be treated with medication. Because most UTIs are caused by a bacterial infection, they usually can be treated with the use of antibiotics. The choice of antibiotic and length of treatment depend on your symptoms and the type of bacteria causing your infection. HOME CARE INSTRUCTIONS  If you were prescribed antibiotics, take them exactly as your caregiver instructs you. Finish the medication even if you feel better after  you have only taken some of the medication.  Drink enough water and fluids to keep your urine clear or pale yellow.  Avoid caffeine, tea, and carbonated beverages. They tend to irritate your bladder.  Empty your bladder often. Avoid holding urine for long periods of time.  Empty your bladder before and after sexual intercourse.  After a bowel movement, women should cleanse from front to back. Use each tissue only once. SEEK MEDICAL CARE IF:   You have back pain.  You develop a fever.  Your symptoms do not begin to resolve within 3 days. SEEK IMMEDIATE MEDICAL CARE IF:   You have severe back pain or lower abdominal pain.  You develop chills.  You have nausea or vomiting.  You have continued burning or discomfort with urination. MAKE SURE YOU:   Understand these instructions.  Will watch your condition.  Will get help right away if you are not doing well or get worse.   This information is not intended to replace advice given to you by your health care provider. Make sure you discuss any questions you have with your health care provider.

## 2015-11-13 LAB — URINE CULTURE: Culture: >=100000 — AB

## 2015-11-14 ENCOUNTER — Telehealth: Payer: Self-pay | Admitting: *Deleted

## 2015-11-14 NOTE — ED Notes (Signed)
Post ED Visit - Positive Culture Follow-up  Culture report reviewed by antimicrobial stewardship pharmacist:  []  Enzo BiNathan Batchelder, Pharm.D. []  Celedonio MiyamotoJeremy Frens, Pharm.D., BCPS [x]  Garvin FilaMike Maccia, Pharm.D. []  Georgina PillionElizabeth Martin, [address]Ph[address]D., BCPS []  BerlinMinh Pham, [address]Pharm[address]D., BCPS, AAHIVP []  Estella HuskMichelle Turner, Pharm.D., BCPS, AAHIVP []  Tennis Mustassie Stewart, [address]Pharm[address] []  Sherle Poeob Vincent, [address]Ph[address]  Positive urine culture Treated with Cephalexin, organism sensitive to the same and no further patient follow-up is required at this time.  Virl AxeRobertson, Skyler Carel [facility] 11/14/2015, 11:57 AM

## 2016-01-05 ENCOUNTER — Encounter (HOSPITAL_COMMUNITY): Payer: Self-pay | Admitting: Emergency Medicine

## 2016-01-05 ENCOUNTER — Emergency Department (HOSPITAL_COMMUNITY)
Admission: EM | Admit: 2016-01-05 | Discharge: 2016-01-05 | Disposition: A | Discharge status: Home / Self Care | Payer: Medicaid Other | Attending: Emergency Medicine | Admitting: Emergency Medicine

## 2016-01-05 DIAGNOSIS — F1490 Cocaine use, unspecified, uncomplicated: Secondary | ICD-10-CM | POA: Insufficient documentation

## 2016-01-05 DIAGNOSIS — N390 Urinary tract infection, site not specified: Secondary | ICD-10-CM

## 2016-01-05 DIAGNOSIS — F17210 Nicotine dependence, cigarettes, uncomplicated: Secondary | ICD-10-CM | POA: Diagnosis not present

## 2016-01-05 DIAGNOSIS — E785 Hyperlipidemia, unspecified: Secondary | ICD-10-CM | POA: Diagnosis not present

## 2016-01-05 DIAGNOSIS — R300 Dysuria: Secondary | ICD-10-CM | POA: Diagnosis present

## 2016-01-05 DIAGNOSIS — Z79899 Other long term (current) drug therapy: Secondary | ICD-10-CM | POA: Insufficient documentation

## 2016-01-05 DIAGNOSIS — F319 Bipolar disorder, unspecified: Secondary | ICD-10-CM | POA: Diagnosis not present

## 2016-01-05 LAB — URINALYSIS, ROUTINE W REFLEX MICROSCOPIC
BILIRUBIN URINE: NEGATIVE
Glucose, UA: NEGATIVE mg/dL
Ketones, ur: NEGATIVE mg/dL
NITRITE: NEGATIVE
PH: 6.5 (ref 5.0–8.0)
Protein, ur: NEGATIVE mg/dL
SPECIFIC GRAVITY, URINE: 1.005 (ref 1.005–1.030)

## 2016-01-05 LAB — URINE MICROSCOPIC-ADD ON

## 2016-01-05 LAB — POC URINE PREG, ED: Preg Test, Ur: NEGATIVE

## 2016-01-05 MED ORDER — NITROFURANTOIN MONOHYD MACRO 100 MG PO CAPS: 100.0000 mg | ORAL_CAPSULE | Freq: Two times a day (BID) | ORAL | Status: DC

## 2016-01-05 MED ORDER — TRAZODONE HCL 100 MG PO TABS: 100.0000 mg | ORAL_TABLET | Freq: Every day | ORAL | Status: DC

## 2016-01-05 MED ORDER — SERTRALINE HCL 100 MG PO TABS: 100.0000 mg | ORAL_TABLET | Freq: Every day | ORAL | Status: DC

## 2016-01-05 MED ORDER — IBUPROFEN 200 MG PO TABS
600.0000 mg | ORAL_TABLET | Freq: Once | ORAL | Status: AC
  Administered 2016-01-05: 600 mg via ORAL
  Filled 2016-01-05: qty 3

## 2016-01-05 MED ORDER — ZOLPIDEM TARTRATE 10 MG PO TABS: 10.0000 mg | ORAL_TABLET | Freq: Every day | ORAL | Status: DC

## 2016-01-05 MED ORDER — CLONAZEPAM 1 MG PO TABS: 1.0000 mg | ORAL_TABLET | Freq: Two times a day (BID) | ORAL | Status: DC | PRN

## 2016-01-05 MED ORDER — FLUCONAZOLE 200 MG PO TABS: 200.0000 mg | ORAL_TABLET | Freq: Every day | ORAL | Status: AC

## 2016-01-05 MED ORDER — PHENAZOPYRIDINE HCL 200 MG PO TABS
200.0000 mg | ORAL_TABLET | Freq: Three times a day (TID) | ORAL | Status: DC
Start: 2016-01-05 — End: 2016-09-20
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
## 2016-01-05 NOTE — ED Notes (Signed)
Per pt, states burning with urination and lower back pain for 4 days-also wants refills on meds

## 2016-01-05 NOTE — Discharge Instructions (Signed)
Follow-up with your primary care provider within 3 days to be reevaluated. We will perform a culture of your urine and if the antibiotic I have prescribed is not the best antibiotic for your infection you will receive a phone call.  Return to emergency department if your symptoms worsen, you experience worsening abdominal or back pain, fever, vomiting.  Urinary Tract Infection Urinary tract infections (UTIs) can develop anywhere along your urinary tract. Your urinary tract is your body's drainage system for removing wastes and extra water. Your urinary tract includes two kidneys, two ureters, a bladder, and a urethra. Your kidneys are a pair of bean-shaped organs. Each kidney is about the size of your fist. They are located below your ribs, one on each side of your spine. CAUSES Infections are caused by microbes, which are microscopic organisms, including fungi, viruses, and bacteria. These organisms are so small that they can only be seen through a microscope. Bacteria are the microbes that most commonly cause UTIs. SYMPTOMS  Symptoms of UTIs may vary by age and gender of the patient and by the location of the infection. Symptoms in young women typically include a frequent and intense urge to urinate and a painful, burning feeling in the bladder or urethra during urination. Older women and men are more likely to be tired, shaky, and weak and have muscle aches and abdominal pain. A fever may mean the infection is in your kidneys. Other symptoms of a kidney infection include pain in your back or sides below the ribs, nausea, and vomiting. DIAGNOSIS To diagnose a UTI, your caregiver will ask you about your symptoms. Your caregiver will also ask you to provide a urine sample. The urine sample will be tested for bacteria and white blood cells. White blood cells are made by your body to help fight infection. TREATMENT  Typically, UTIs can be treated with medication. Because most UTIs are caused by a  bacterial infection, they usually can be treated with the use of antibiotics. The choice of antibiotic and length of treatment depend on your symptoms and the type of bacteria causing your infection. HOME CARE INSTRUCTIONS  If you were prescribed antibiotics, take them exactly as your caregiver instructs you. Finish the medication even if you feel better after you have only taken some of the medication.  Drink enough water and fluids to keep your urine clear or pale yellow.  Avoid caffeine, tea, and carbonated beverages. They tend to irritate your bladder.  Empty your bladder often. Avoid holding urine for long periods of time.  Empty your bladder before and after sexual intercourse.  After a bowel movement, women should cleanse from front to back. Use each tissue only once. SEEK MEDICAL CARE IF:   You have back pain.  You develop a fever.  Your symptoms do not begin to resolve within 3 days. SEEK IMMEDIATE MEDICAL CARE IF:   You have severe back pain or lower abdominal pain.  You develop chills.  You have nausea or vomiting.  You have continued burning or discomfort with urination. MAKE SURE YOU:   Understand these instructions.  Will watch your condition.  Will get help right away if you are not doing well or get worse.   This information is not intended to replace advice given to you by your health care provider. Make sure you discuss any questions you have with your health care provider.   Document Released: 04/14/2005 Document Revised: 03/26/2015 Document Reviewed: 08/13/2011 Elsevier Interactive Patient Education [employer]2016 Else[employer] Inc.

## 2016-01-07 LAB — URINE CULTURE

## 2016-01-08 ENCOUNTER — Telehealth: Payer: Self-pay | Admitting: *Deleted

## 2016-01-08 NOTE — ED Notes (Signed)
Post ED Visit - Positive Culture Follow-up  Culture report reviewed by antimicrobial stewardship pharmacist:  []  Enzo BiNathan Batchelder, Pharm.D. []  Celedonio MiyamotoJeremy Frens, Pharm.D., BCPS []  Garvin FilaMike Maccia, Pharm.D. []  Georgina PillionElizabeth Martin, Pharm.D., BCPS []  HillsboroMinh Pham, [address]Pharm[address]D., BCPS, AAHIVP []  Estella HuskMichelle Turner, Pharm.D., BCPS, AAHIVP [x]  Tennis Mustassie Stewart, Pharm.D. []  Sherle Poeob Vincent, [address]Pharm[address]  Positive urine culture Treated with Fluconazole, organism sensitive to the same and no further patient follow-up is required at this time.  Virl AxeRobertson, Nicoles Sedlacek [facility] 01/08/2016, 11:37 AM

## 2016-05-09 IMAGING — CR DG CHEST 2V
2 series · 2 of 2 positions shown · non-contrast
Comparison: Two-view chest 10/16/2010

CLINICAL DATA: Right upper quadrant pain. Cough. Nausea and
vomiting.

EXAM:
CHEST  2 VIEW

[w chest pa]
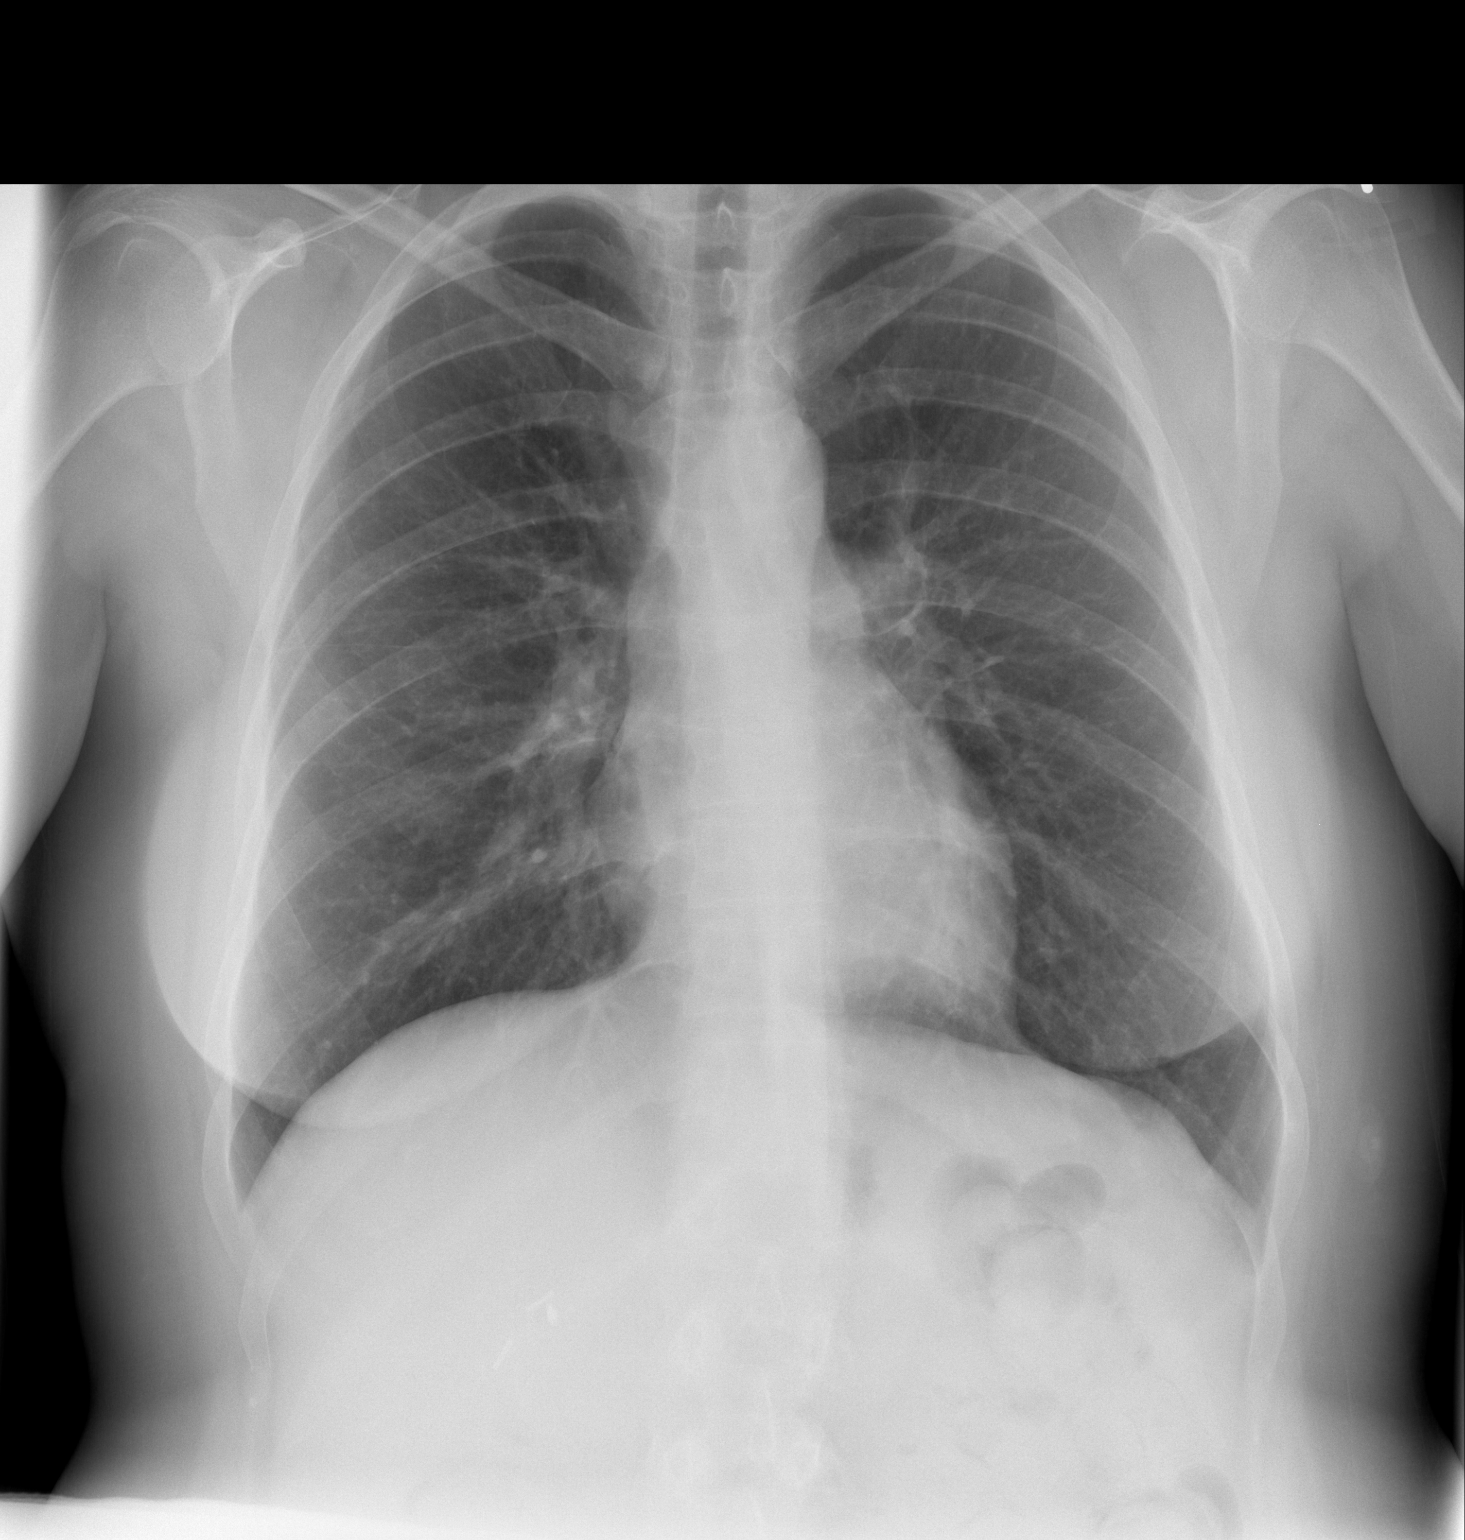

[w chest lat]
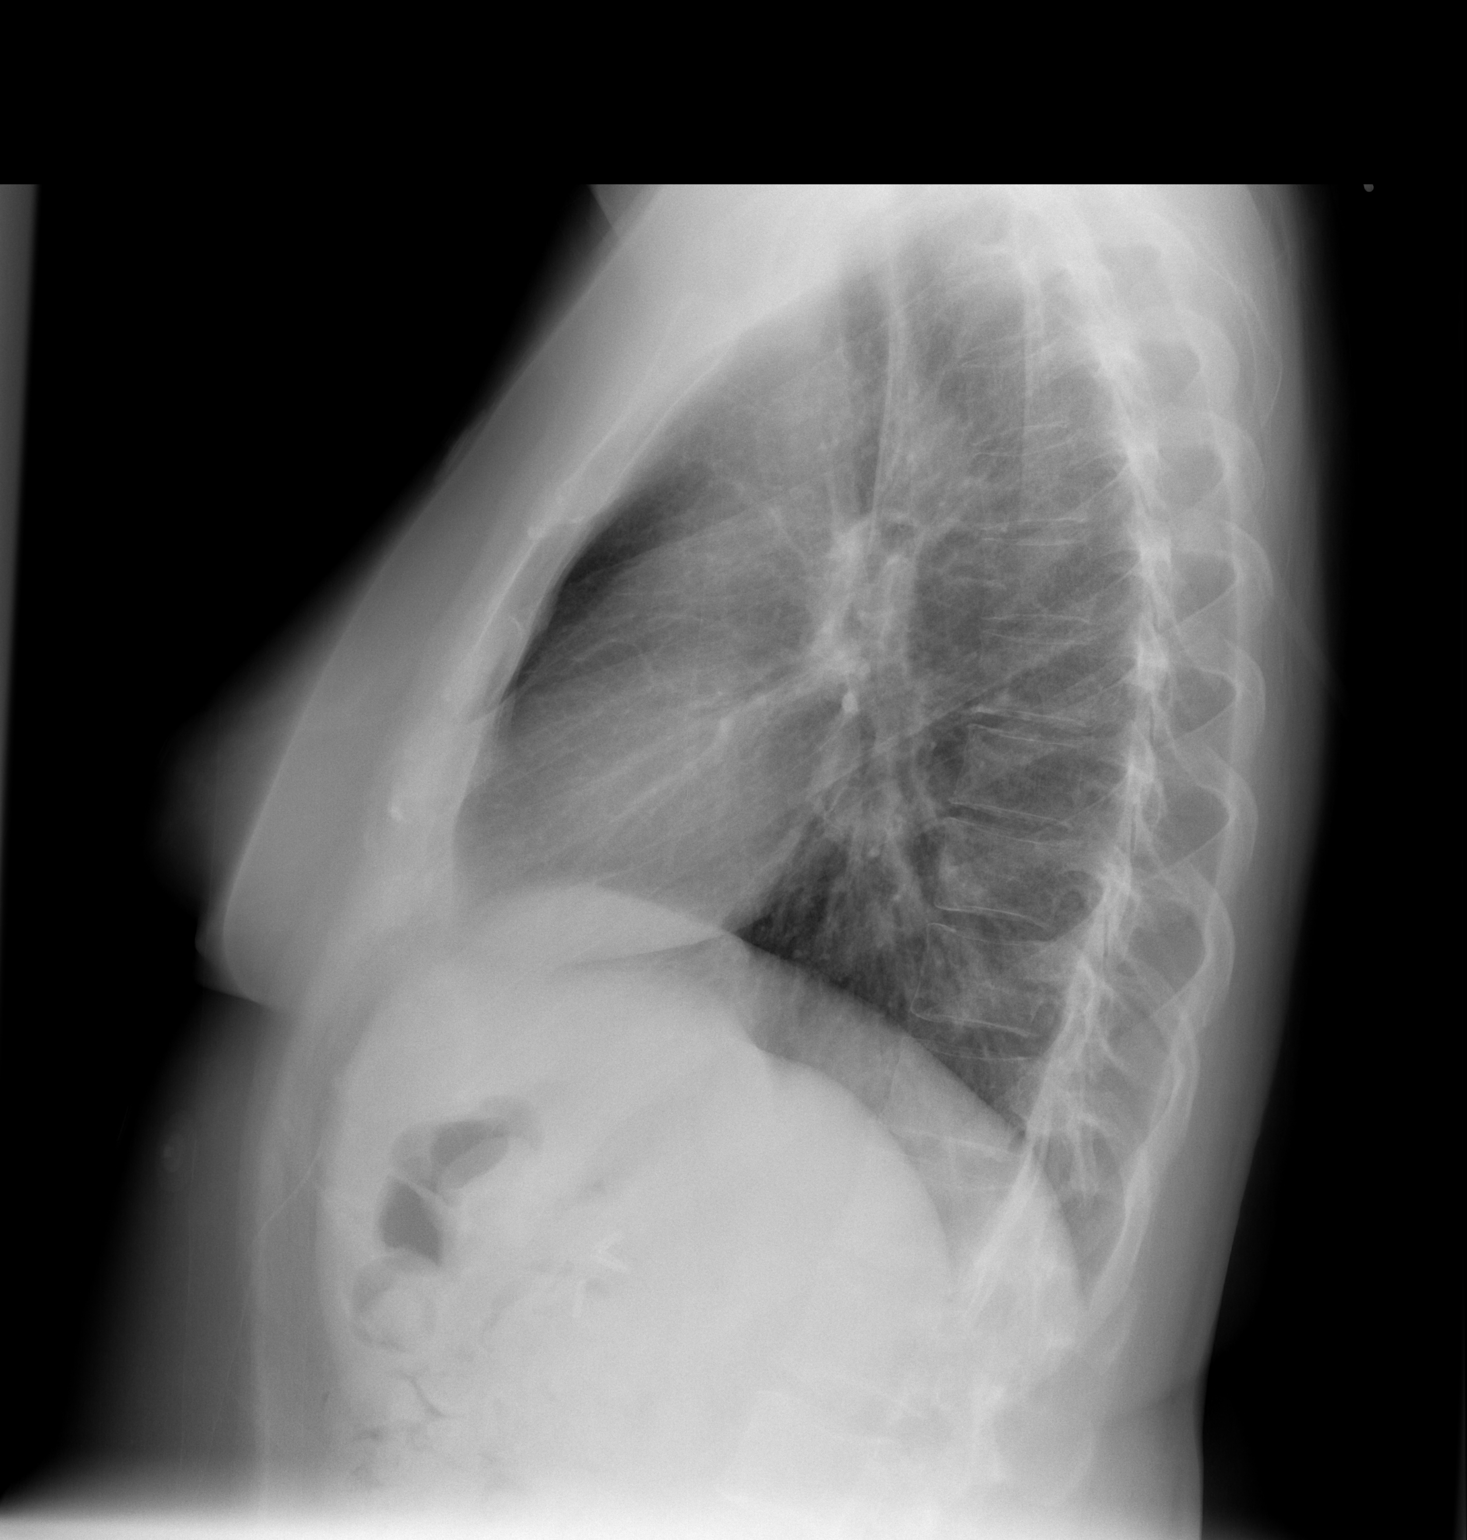

[2 of 2 positions shown; findings below may reference images not displayed]

FINDINGS: The heart size and mediastinal contours are within normal limits.
Both lungs are clear. The visualized skeletal structures are
unremarkable. Surgical clips are present at the gallbladder fossa.
IMPRESSION: No active cardiopulmonary disease.

## 2016-05-12 IMAGING — CR DG CHEST 1V PORT
1 series · 1 of 1 positions shown · non-contrast
Comparison: Chest radiograph December 28, 2013

CLINICAL DATA: Migraine, headache, nausea, history of bronchitis.

EXAM:
PORTABLE CHEST - 1 VIEW

[AP]
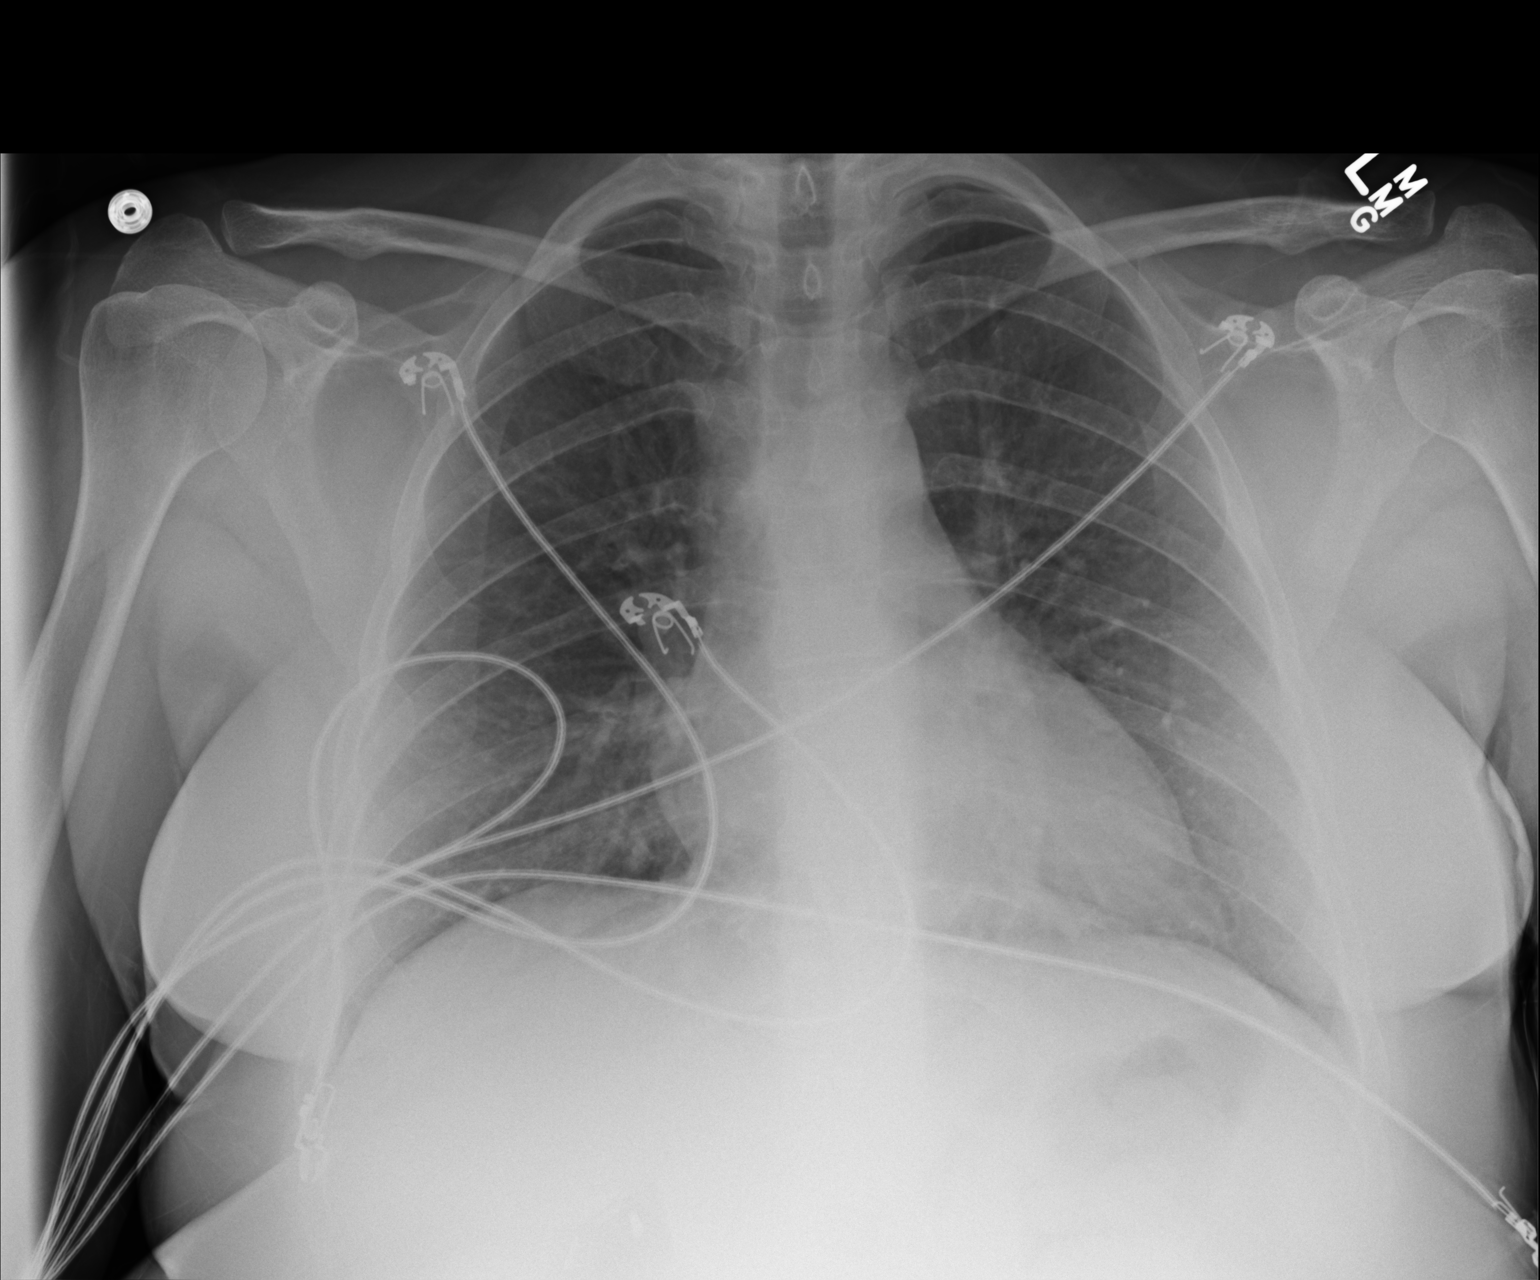

[1 of 1 positions shown; findings below may reference images not displayed]

FINDINGS: Cardiomediastinal silhouette is unremarkable. The lungs are clear
without pleural effusions or focal consolidations. Trachea projects
midline and there is no pneumothorax. Soft tissue planes and
included osseous structures are non-suspicious. Surgical clips in
the right abdomen most consistent with cholecystectomy. Multiple EKG
lines overlie the patient and may obscure subtle underlying
pathology.
IMPRESSION: No acute cardiopulmonary process.

  By: Esteeban Priscilla

## 2016-05-15 IMAGING — CT CT ABD-PELV W/O CM
2 of 4 series · 15 of 46 positions shown, 17 images · non-contrast
Comparison: Prior CT from 09/17/2013

CLINICAL DATA: Left flank pain.  Evaluate for renal stones.

EXAM:
CT ABDOMEN AND PELVIS WITHOUT CONTRAST
TECHNIQUE: Multidetector CT imaging of the abdomen and pelvis was performed
following the standard protocol without IV contrast.

[Series 2: stone study 5.0 i30f 1 · axial · 0.86mm/px · z∈[-362,+63]mm · 12 of 93 slices shown, 14 images]
[im 4/93  soft-tissue]
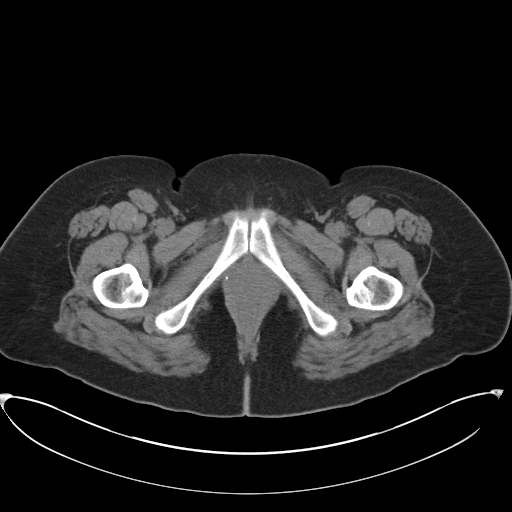
[im 4/93  bone]
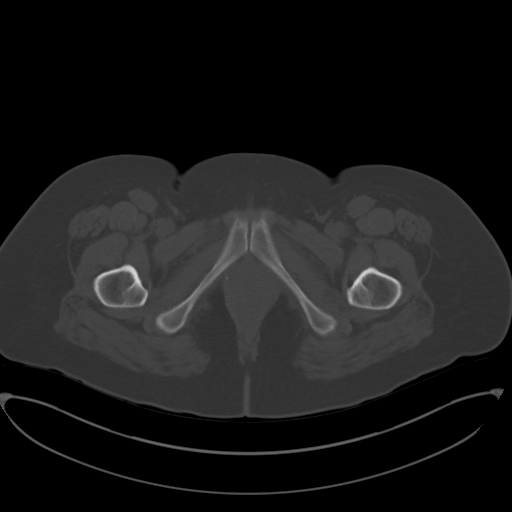
[im 12/93  soft-tissue]
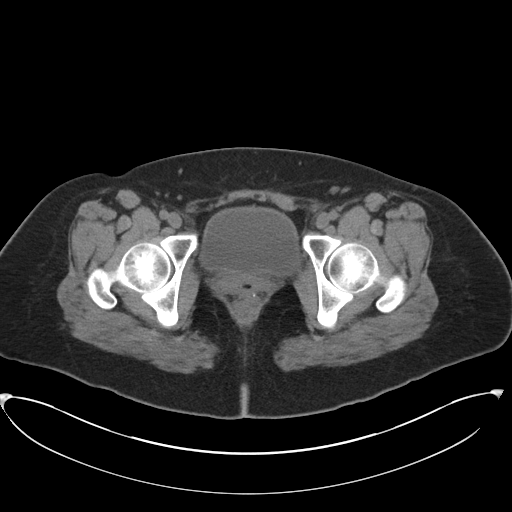
[im 20/93  soft-tissue]
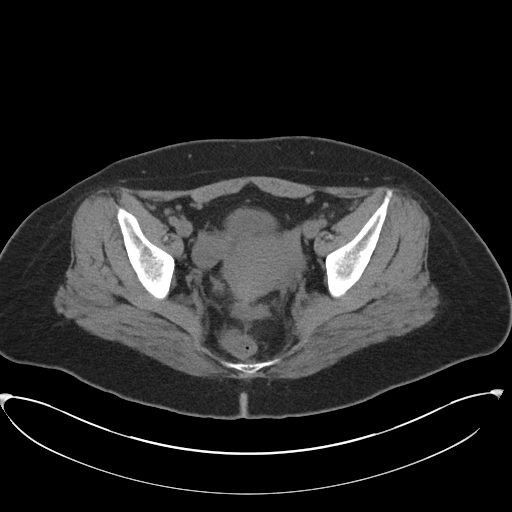
[im 27/93  soft-tissue]
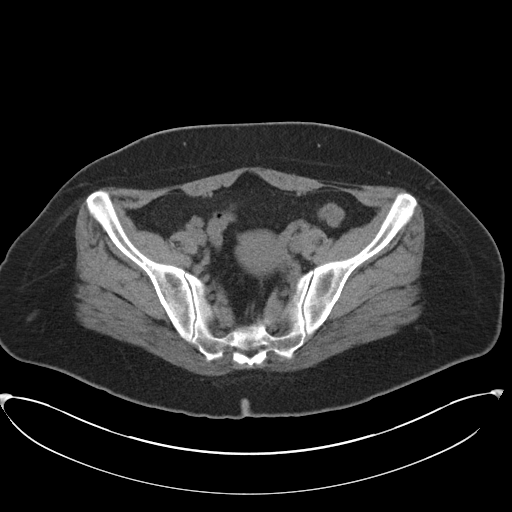
[im 35/93  soft-tissue]
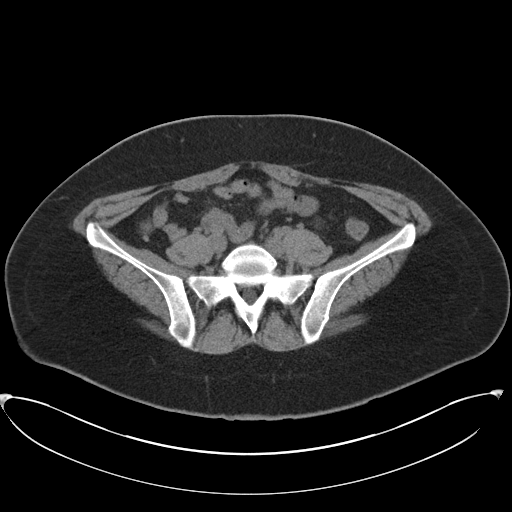
[im 43/93  soft-tissue]
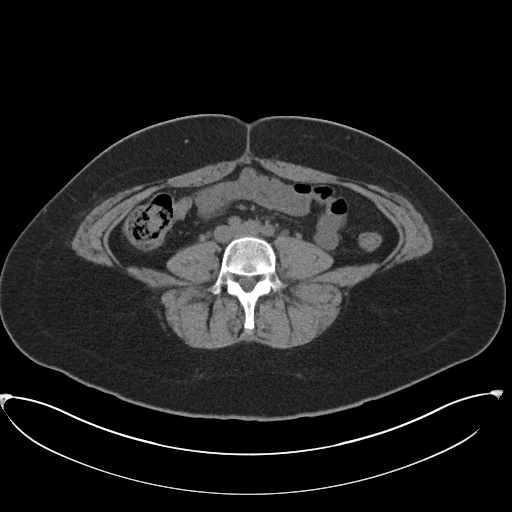
[im 50/93  soft-tissue]
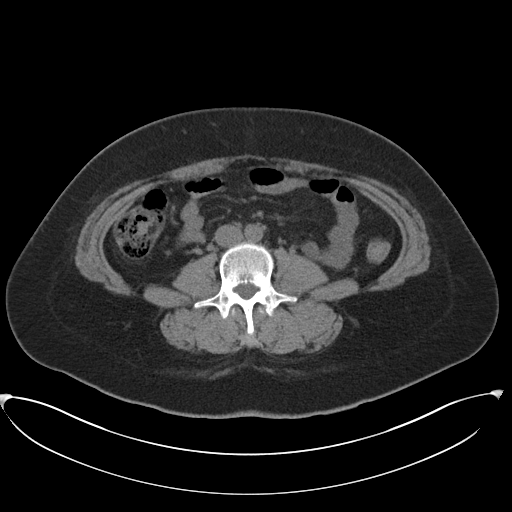
[im 58/93  soft-tissue]
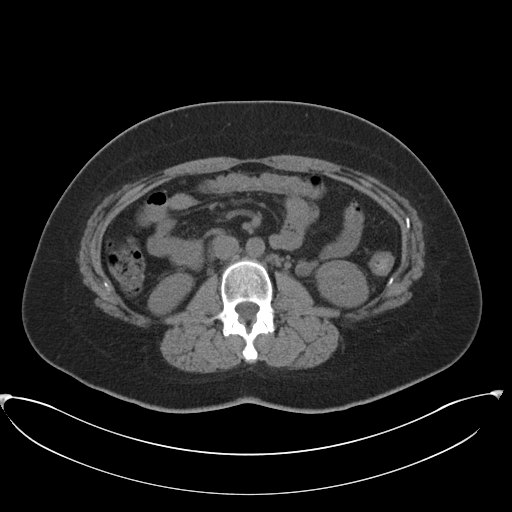
[im 66/93  soft-tissue]
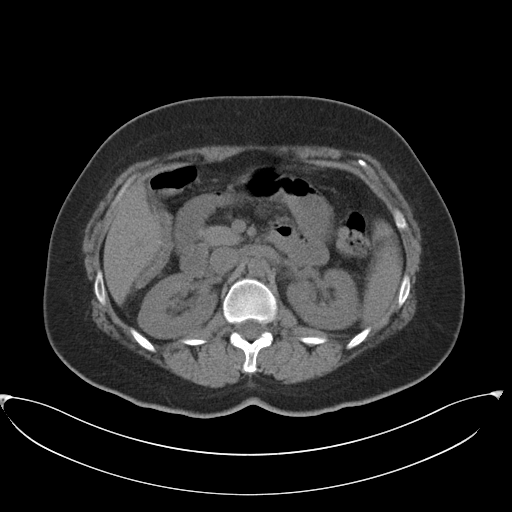
[im 66/93  bone]
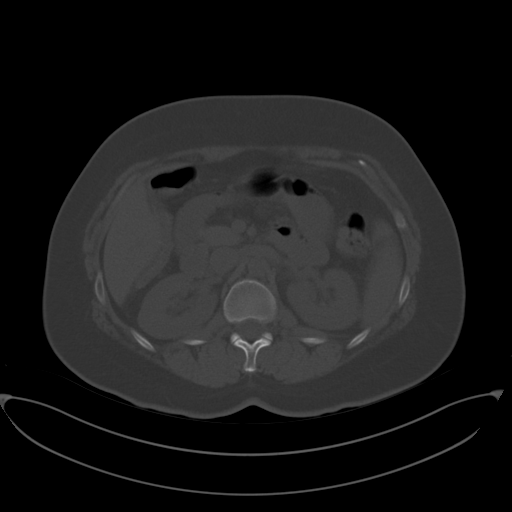
[im 73/93  soft-tissue]
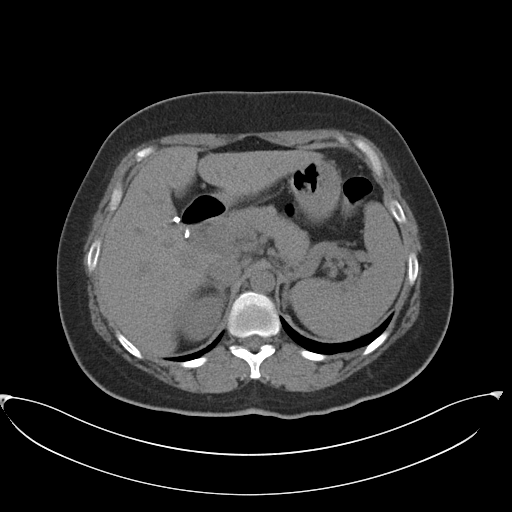
[im 81/93  soft-tissue]
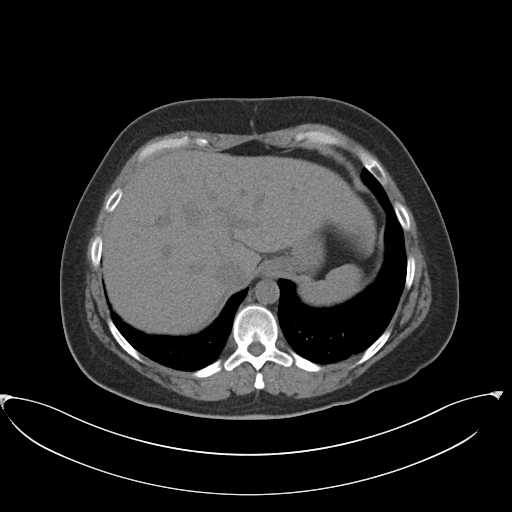
[im 89/93  soft-tissue]
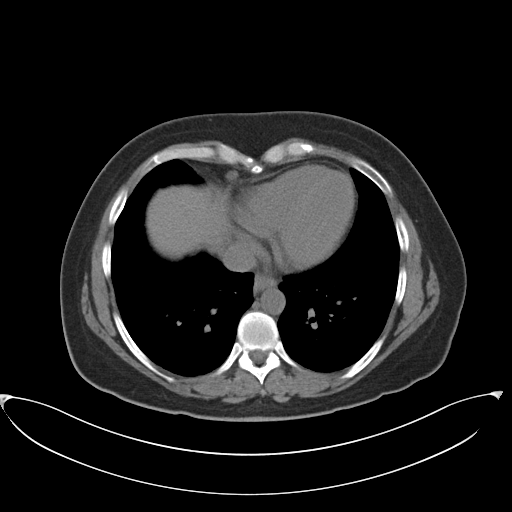

[Series 5: coronal soft tissue · coronal · 1.01mm/px · 3 of 101 slices shown]
[im 34/101  soft-tissue]
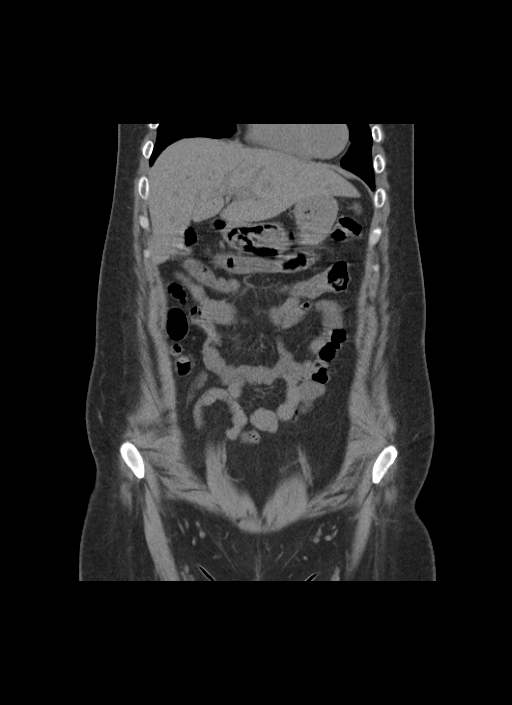
[im 45/101  soft-tissue]
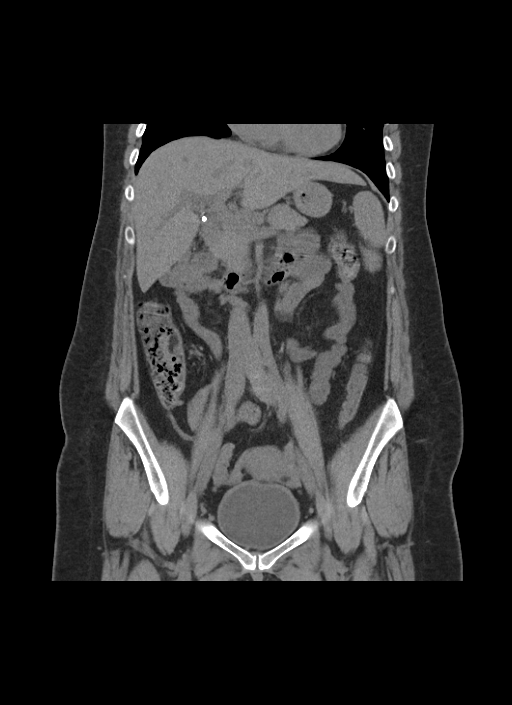
[im 56/101  soft-tissue]
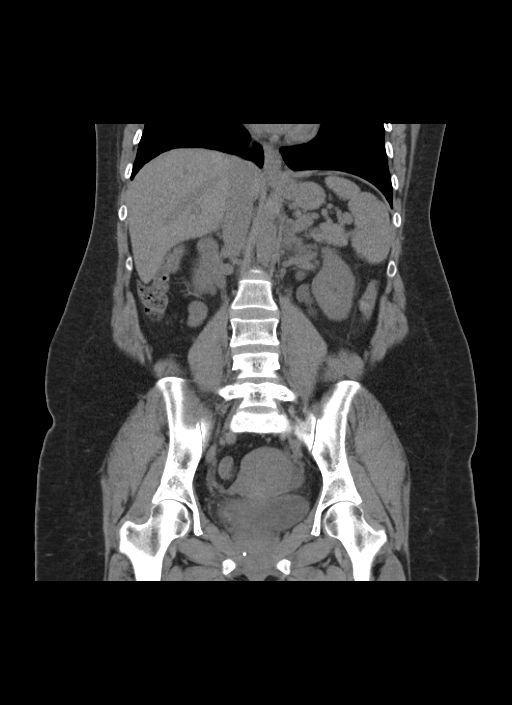

[15 of 46 positions shown; findings below may reference images not displayed]

FINDINGS: Subsegmental atelectasis seen dependently within the visualized lung
bases. Subcentimeter nodule measuring 5 mm present within the
peripheral left lower lobe (series 3, image 9).

Previously described hypodensity within the inferior right hepatic
lobe is grossly stable. The liver is otherwise unremarkable and
demonstrates a normal unenhanced appearance. Gallbladder is
surgically absent. No biliary ductal dilatation. The spleen, adrenal
glands, and pancreas demonstrate a normal unenhanced appearance.

Tiny punctate stones measuring up to 3 mm are seen within the
kidneys bilaterally. No obstructive stone seen along the course of
either renal collecting system or within the bladder. No
hydronephrosis or hydroureter.

Stomach is within normal limits. No evidence of bowel obstruction.
No abnormal wall thickening or inflammatory changes seen about the
bowels. Appendix well visualized in the right lower quadrant and is
of normal caliber and appearance without associated inflammatory
changes to suggest acute appendicitis.

Bladder is within normal limits. Uterus and ovaries are
unremarkable.

No free air or fluid. No enlarged intra-abdominal pelvic lymph
nodes.

No acute osseous abnormality. No worrisome lytic or blastic osseous
lesions identified.
IMPRESSION: 1. Bilateral nonobstructive nephrolithiasis measuring up to 3 mm. No
CT evidence of obstructive uropathy.
2. No other acute intra-abdominal pelvic process.
3. 5 mm subpleural left lower lobe nodule. If the patient is at high
risk for bronchogenic carcinoma, follow-up chest CT at 6-12 months
is recommended. If the patient is at low risk for bronchogenic
carcinoma, follow-up chest CT at 12 months is recommended. This
recommendation follows the consensus statement: Guidelines for
Management of Small Pulmonary Nodules Detected on CT Scans: A
Statement from the [HOSPITAL] as published in Radiology

## 2016-05-18 IMAGING — CR DG CHEST 2V
2 series · 2 of 2 positions shown · non-contrast
Comparison: 01/03/2014

CLINICAL DATA: Chest pain and bilateral leg swelling. Shortness of
breath. Nausea. Fatigue.

EXAM:
CHEST  2 VIEW

[w chest pa]
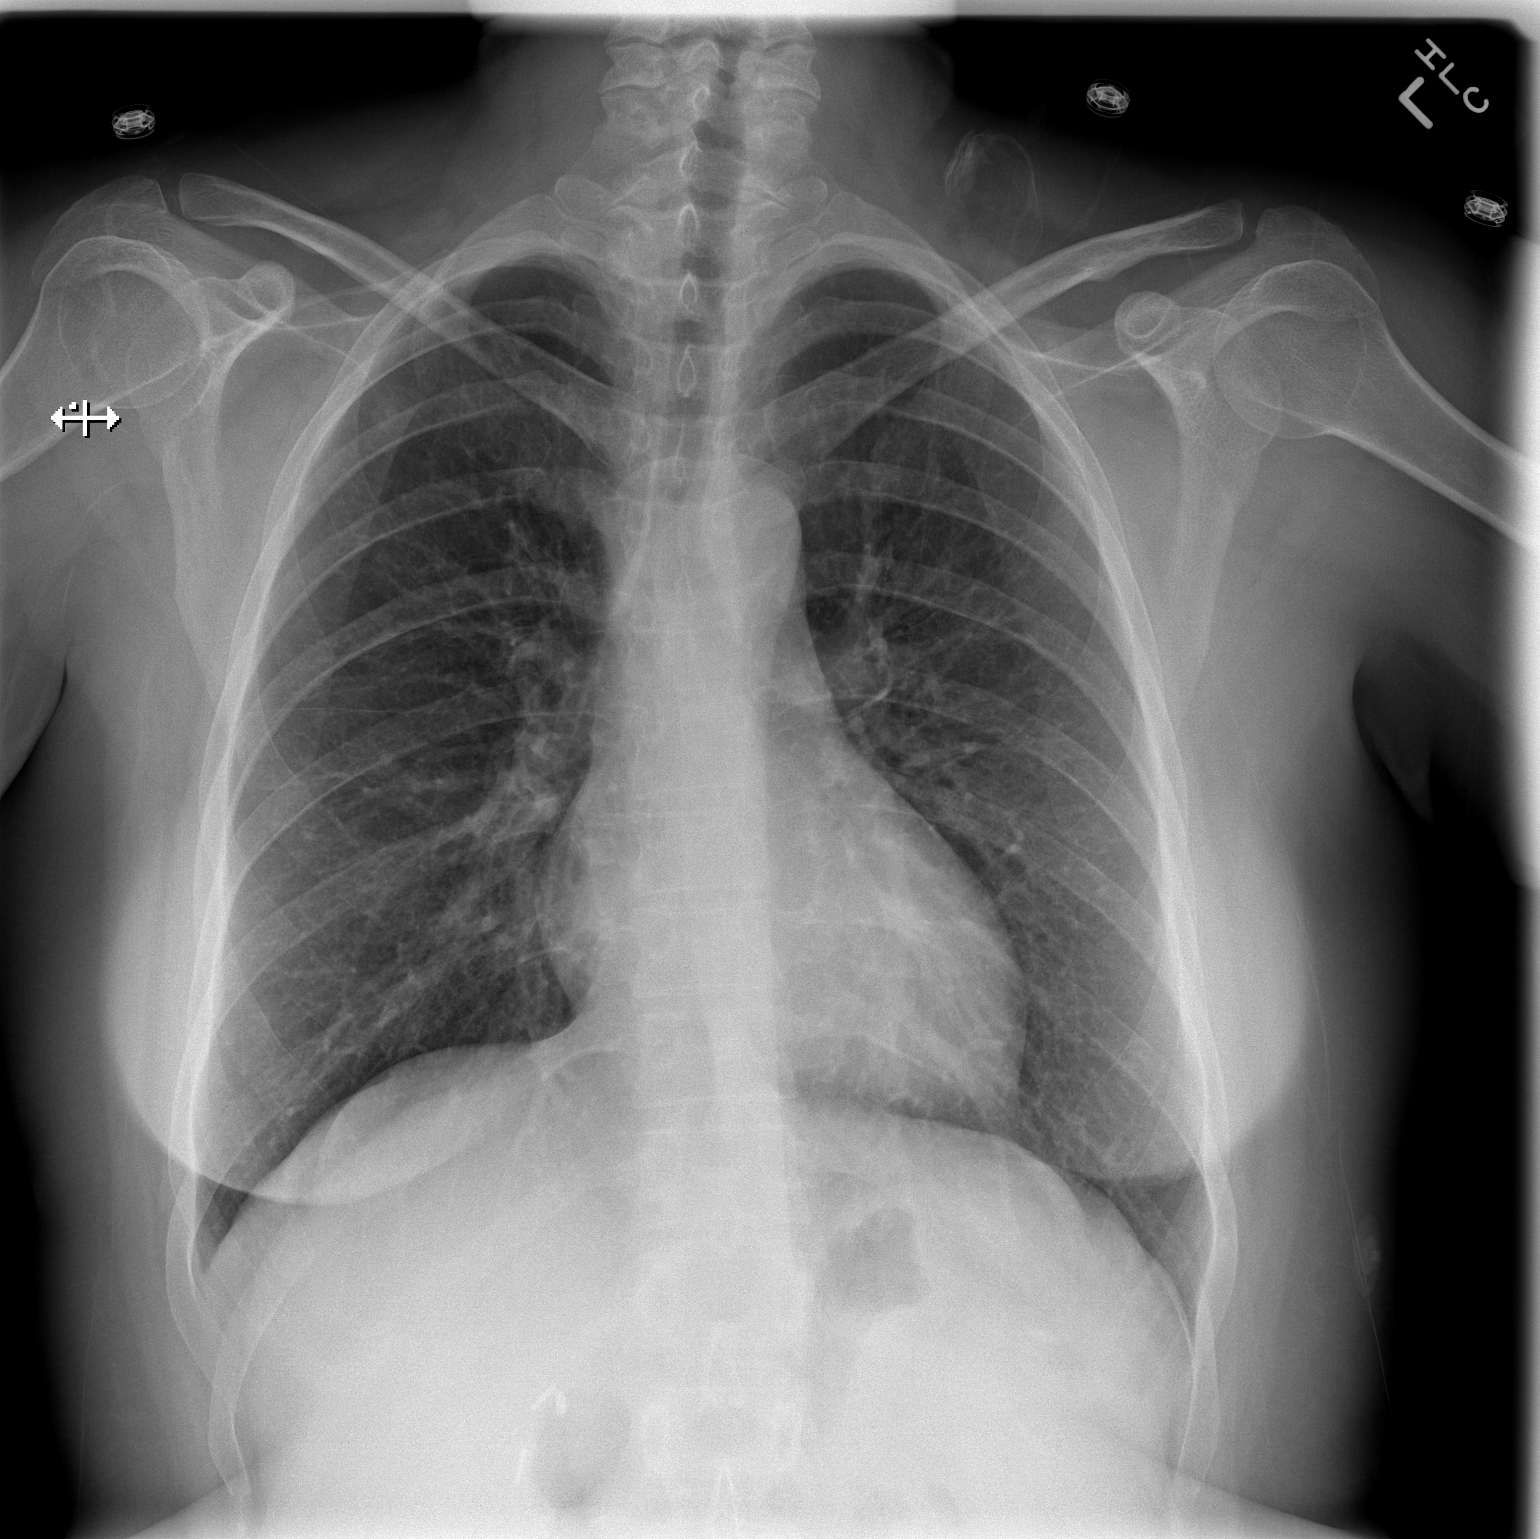

[w chest lat]
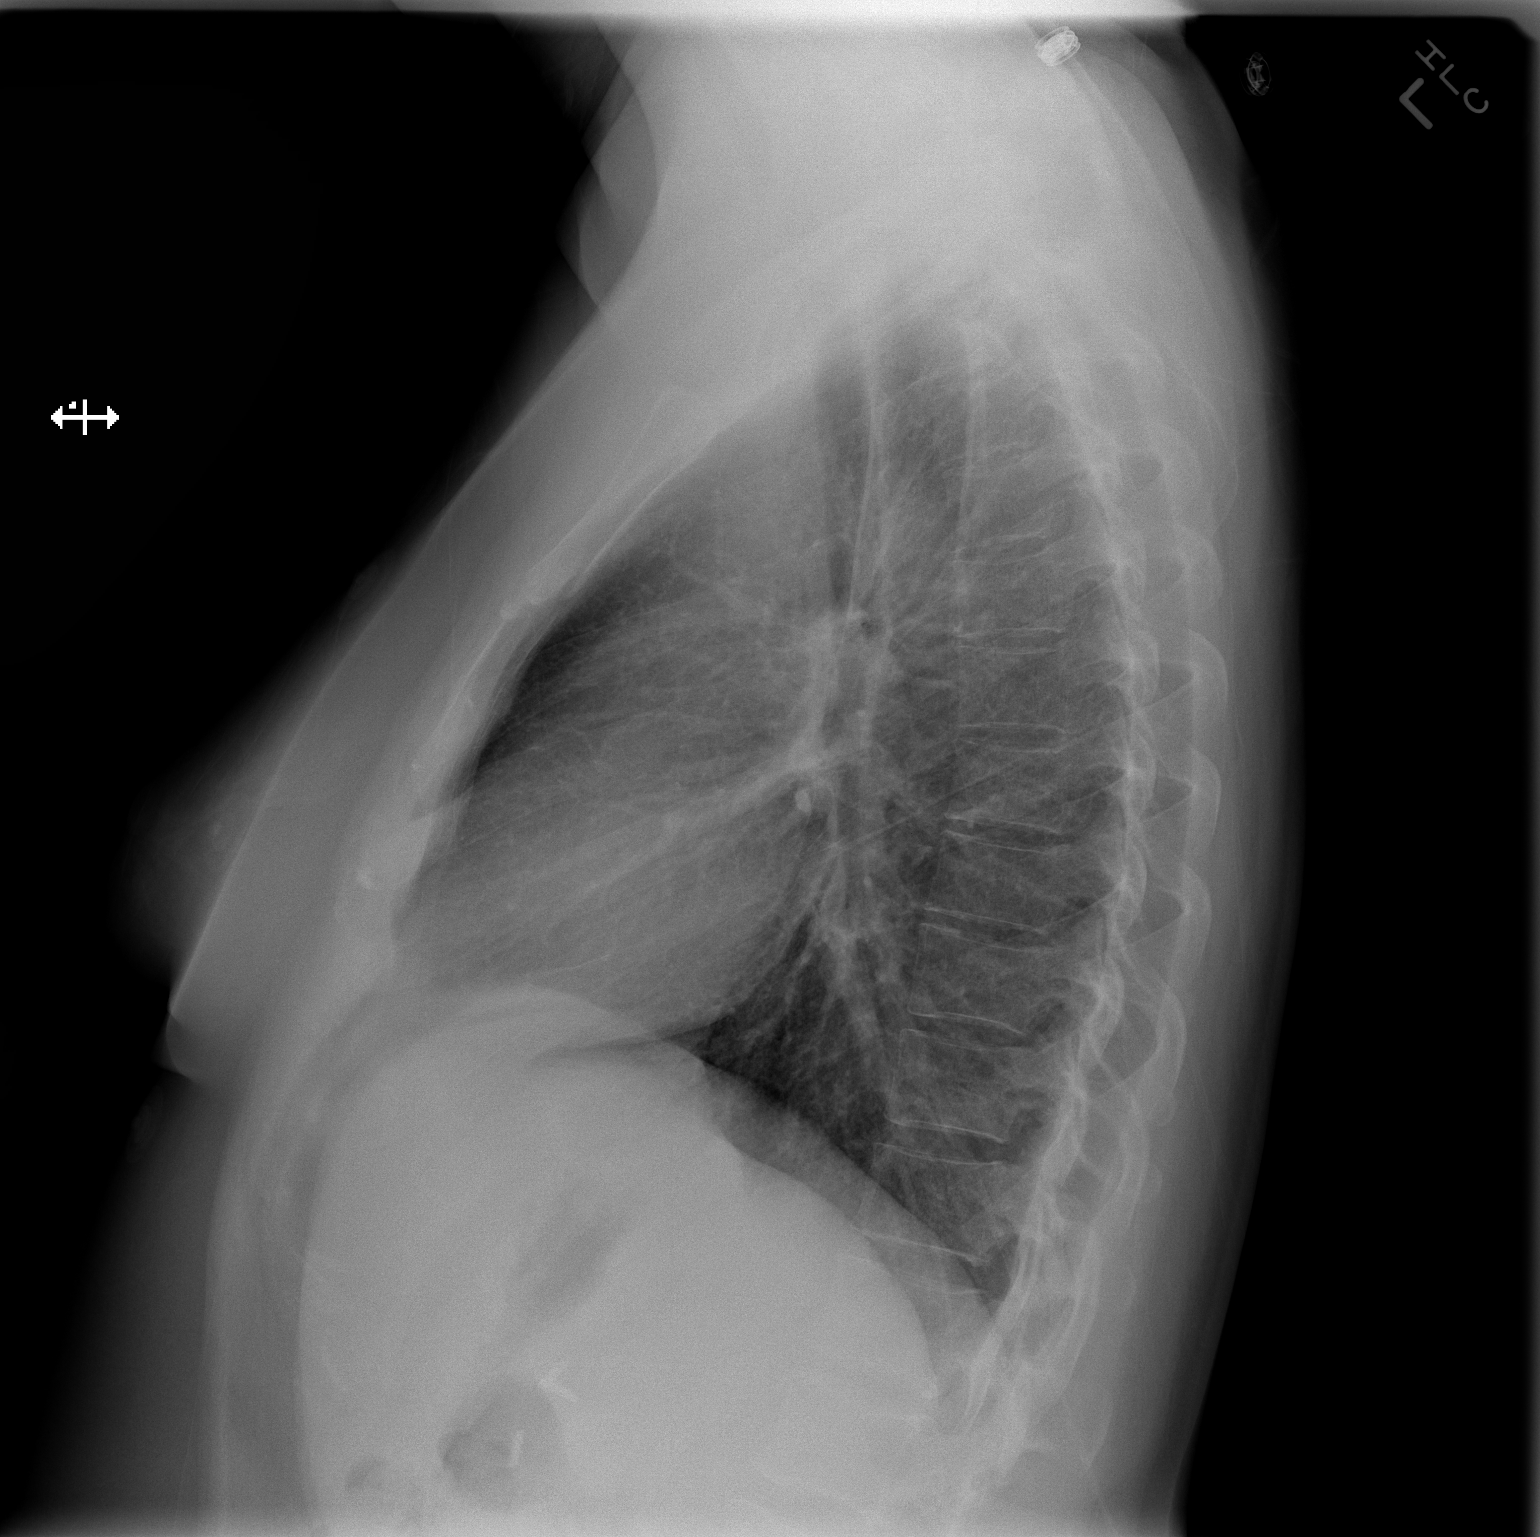

[2 of 2 positions shown; findings below may reference images not displayed]

FINDINGS: Normal heart size and pulmonary vascularity. Improved appearance of
interstitial changes in the lungs with improved inspiration. No
focal airspace disease or consolidation suggested. No blunting of
costophrenic angles.
IMPRESSION: No active cardiopulmonary disease.

## 2016-09-20 ENCOUNTER — Emergency Department (HOSPITAL_COMMUNITY): Payer: Medicaid Other

## 2016-09-20 ENCOUNTER — Encounter (HOSPITAL_COMMUNITY): Payer: Self-pay | Admitting: Emergency Medicine

## 2016-09-20 ENCOUNTER — Emergency Department (HOSPITAL_COMMUNITY)
Admission: EM | Admit: 2016-09-20 | Discharge: 2016-09-20 | Disposition: A | Discharge status: Home / Self Care | Payer: Medicaid Other | Attending: Emergency Medicine | Admitting: Emergency Medicine

## 2016-09-20 DIAGNOSIS — F17210 Nicotine dependence, cigarettes, uncomplicated: Secondary | ICD-10-CM | POA: Diagnosis not present

## 2016-09-20 DIAGNOSIS — R0789 Other chest pain: Secondary | ICD-10-CM | POA: Diagnosis not present

## 2016-09-20 DIAGNOSIS — R079 Chest pain, unspecified: Secondary | ICD-10-CM | POA: Diagnosis present

## 2016-09-20 LAB — URINALYSIS, ROUTINE W REFLEX MICROSCOPIC
BILIRUBIN URINE: NEGATIVE
Bacteria, UA: NONE SEEN
GLUCOSE, UA: NEGATIVE mg/dL
KETONES UR: NEGATIVE mg/dL
LEUKOCYTES UA: NEGATIVE
NITRITE: NEGATIVE
PH: 6 (ref 5.0–8.0)
Protein, ur: NEGATIVE mg/dL
RBC / HPF: NONE SEEN RBC/hpf (ref 0–5)
Specific Gravity, Urine: 1.002 — ABNORMAL LOW (ref 1.005–1.030)

## 2016-09-20 LAB — CBC
HCT: 43.1 % (ref 36.0–46.0)
Hemoglobin: 15.1 g/dL — ABNORMAL HIGH (ref 12.0–15.0)
MCH: 30.2 pg (ref 26.0–34.0)
MCHC: 35 g/dL (ref 30.0–36.0)
MCV: 86.2 fL (ref 78.0–100.0)
PLATELETS: 280 10*3/uL (ref 150–400)
RBC: 5 MIL/uL (ref 3.87–5.11)
RDW: 14.2 % (ref 11.5–15.5)
WBC: 8 10*3/uL (ref 4.0–10.5)

## 2016-09-20 LAB — BASIC METABOLIC PANEL
ANION GAP: 11 (ref 5–15)
BUN: 6 mg/dL (ref 6–20)
CO2: 19 mmol/L — ABNORMAL LOW (ref 22–32)
CREATININE: 0.64 mg/dL (ref 0.44–1.00)
Calcium: 8.9 mg/dL (ref 8.9–10.3)
Chloride: 105 mmol/L (ref 101–111)
GFR calc non Af Amer: >60 mL/min (ref >60)
Glucose, Bld: 92 mg/dL (ref 65–99)
Potassium: 3.7 mmol/L (ref 3.5–5.1)
SODIUM: 135 mmol/L (ref 135–145)

## 2016-09-20 LAB — TROPONIN I

## 2016-09-20 LAB — POC URINE PREG, ED: Preg Test, Ur: NEGATIVE

## 2016-09-20 MED ORDER — SODIUM CHLORIDE 0.9 % IV BOLUS (SEPSIS)
1000.0000 mL | Freq: Once | INTRAVENOUS | Status: AC
  Administered 2016-09-20: 1000 mL via INTRAVENOUS

## 2016-09-20 MED ORDER — KETOROLAC TROMETHAMINE 30 MG/ML IJ SOLN
30.0000 mg | Freq: Once | INTRAMUSCULAR | Status: AC
  Administered 2016-09-20: 30 mg via INTRAVENOUS
  Filled 2016-09-20: qty 1

## 2016-09-20 MED ORDER — ONDANSETRON HCL 4 MG/2ML IJ SOLN
4.0000 mg | Freq: Once | INTRAMUSCULAR | Status: AC
  Administered 2016-09-20: 4 mg via INTRAVENOUS
  Filled 2016-09-20: qty 2

## 2016-09-20 MED ORDER — DIAZEPAM 5 MG PO TABS
5.0000 mg | ORAL_TABLET | Freq: Once | ORAL | Status: AC
  Administered 2016-09-20: 5 mg via ORAL
  Filled 2016-09-20: qty 1
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
## 2016-09-20 NOTE — ED Notes (Signed)
ED Provider at bedside. 

## 2016-09-20 NOTE — ED Triage Notes (Signed)
Patient c/o central chest pain that radiates to left arm that started this morning. Patient states that she was shaky in her legs when walking down stairs earlier today and patient states that past out twice today.

## 2016-09-20 NOTE — Discharge Instructions (Signed)
Please call your primary care provider in the morning to schedule a follow-up appointment. Please return to ER for exertional chest pain, difficulty breathing, new or worsening symptoms, any additional concerns.

## 2017-04-02 ENCOUNTER — Emergency Department (HOSPITAL_COMMUNITY)
Admission: EM | Admit: 2017-04-02 | Discharge: 2017-04-05 | Disposition: A | Discharge status: Home / Self Care | Payer: Medicaid Other | Attending: Emergency Medicine | Admitting: Emergency Medicine

## 2017-04-02 ENCOUNTER — Encounter (HOSPITAL_COMMUNITY): Payer: Self-pay | Admitting: Emergency Medicine

## 2017-04-02 ENCOUNTER — Emergency Department (HOSPITAL_COMMUNITY): Payer: Medicaid Other

## 2017-04-02 DIAGNOSIS — F332 Major depressive disorder, recurrent severe without psychotic features: Secondary | ICD-10-CM | POA: Diagnosis not present

## 2017-04-02 DIAGNOSIS — F1910 Other psychoactive substance abuse, uncomplicated: Secondary | ICD-10-CM | POA: Insufficient documentation

## 2017-04-02 DIAGNOSIS — F17210 Nicotine dependence, cigarettes, uncomplicated: Secondary | ICD-10-CM | POA: Insufficient documentation

## 2017-04-02 DIAGNOSIS — X789XXA Intentional self-harm by unspecified sharp object, initial encounter: Secondary | ICD-10-CM | POA: Diagnosis not present

## 2017-04-02 DIAGNOSIS — Z79899 Other long term (current) drug therapy: Secondary | ICD-10-CM | POA: Diagnosis not present

## 2017-04-02 DIAGNOSIS — F1120 Opioid dependence, uncomplicated: Secondary | ICD-10-CM | POA: Diagnosis present

## 2017-04-02 DIAGNOSIS — R45851 Suicidal ideations: Secondary | ICD-10-CM | POA: Insufficient documentation

## 2017-04-02 DIAGNOSIS — F1920 Other psychoactive substance dependence, uncomplicated: Secondary | ICD-10-CM | POA: Diagnosis present

## 2017-04-02 DIAGNOSIS — Z046 Encounter for general psychiatric examination, requested by authority: Secondary | ICD-10-CM | POA: Insufficient documentation

## 2017-04-02 DIAGNOSIS — F329 Major depressive disorder, single episode, unspecified: Secondary | ICD-10-CM | POA: Diagnosis present

## 2017-04-02 DIAGNOSIS — Z23 Encounter for immunization: Secondary | ICD-10-CM | POA: Insufficient documentation

## 2017-04-02 LAB — CBC
HCT: 38.9 % (ref 36.0–46.0)
Hemoglobin: 13.6 g/dL (ref 12.0–15.0)
MCH: 30.8 pg (ref 26.0–34.0)
MCHC: 35 g/dL (ref 30.0–36.0)
MCV: 88.2 fL (ref 78.0–100.0)
PLATELETS: 290 10*3/uL (ref 150–400)
RBC: 4.41 MIL/uL (ref 3.87–5.11)
RDW: 13.5 % (ref 11.5–15.5)
WBC: 6 10*3/uL (ref 4.0–10.5)

## 2017-04-02 LAB — I-STAT TROPONIN, ED: TROPONIN I, POC: 0 ng/mL (ref 0.00–0.08)

## 2017-04-02 NOTE — ED Notes (Signed)
Bed: [facility] Expected date:  Expected time:  Means of arrival:  Comments: 

## 2017-04-02 NOTE — ED Triage Notes (Signed)
Patient states she wants to take a lot of pills and kill herself. Patient has cuts on wrist that are superficial and are scabbed over. Patient states she did that attempt two days ago.

## 2017-04-03 ENCOUNTER — Encounter (HOSPITAL_COMMUNITY): Payer: Self-pay | Admitting: Emergency Medicine

## 2017-04-03 DIAGNOSIS — F1120 Opioid dependence, uncomplicated: Secondary | ICD-10-CM | POA: Diagnosis present

## 2017-04-03 DIAGNOSIS — F1920 Other psychoactive substance dependence, uncomplicated: Secondary | ICD-10-CM | POA: Diagnosis present

## 2017-04-03 LAB — RAPID URINE DRUG SCREEN, HOSP PERFORMED
Amphetamines: NOT DETECTED
BENZODIAZEPINES: POSITIVE — AB
Barbiturates: NOT DETECTED
Cocaine: NOT DETECTED
OPIATES: POSITIVE — AB
Tetrahydrocannabinol: NOT DETECTED

## 2017-04-03 LAB — ETHANOL: Alcohol, Ethyl (B): 134 mg/dL — ABNORMAL HIGH (ref <5)

## 2017-04-03 LAB — COMPREHENSIVE METABOLIC PANEL
ALBUMIN: 3.5 g/dL (ref 3.5–5.0)
ALK PHOS: 72 U/L (ref 38–126)
ALT: 70 U/L — ABNORMAL HIGH (ref 14–54)
ANION GAP: 8 (ref 5–15)
AST: 78 U/L — ABNORMAL HIGH (ref 15–41)
BUN: 6 mg/dL (ref 6–20)
CALCIUM: 8.3 mg/dL — AB (ref 8.9–10.3)
CO2: 23 mmol/L (ref 22–32)
Chloride: 106 mmol/L (ref 101–111)
Creatinine, Ser: 0.57 mg/dL (ref 0.44–1.00)
GFR calc non Af Amer: >60 mL/min (ref >60)
GLUCOSE: 98 mg/dL (ref 65–99)
POTASSIUM: 3.4 mmol/L — AB (ref 3.5–5.1)
SODIUM: 137 mmol/L (ref 135–145)
Total Bilirubin: <0.1 mg/dL — ABNORMAL LOW (ref 0.3–1.2)
Total Protein: 7.4 g/dL (ref 6.5–8.1)

## 2017-04-03 LAB — ACETAMINOPHEN LEVEL: ACETAMINOPHEN (TYLENOL), SERUM: 15 ug/mL (ref 10–30)

## 2017-04-03 LAB — SALICYLATE LEVEL

## 2017-04-03 LAB — PREGNANCY, URINE: PREG TEST UR: NEGATIVE

## 2017-04-03 MED ORDER — SERTRALINE HCL 50 MG PO TABS: 100.0000 mg | ORAL_TABLET | Freq: Every day | ORAL | Status: DC

## 2017-04-03 MED ORDER — CLONIDINE HCL 0.1 MG PO TABS
0.1000 mg | ORAL_TABLET | Freq: Four times a day (QID) | ORAL | Status: AC
  Administered 2017-04-03 – 2017-04-05 (×7): 0.1 mg via ORAL
  Filled 2017-04-03 (×7): qty 1

## 2017-04-03 MED ORDER — ALUM & MAG HYDROXIDE-SIMETH 200-200-20 MG/5ML PO SUSP: 30.0000 mL | Freq: Four times a day (QID) | ORAL | Status: DC | PRN

## 2017-04-03 MED ORDER — TETANUS-DIPHTH-ACELL PERTUSSIS 5-2.5-18.5 LF-MCG/0.5 IM SUSP
0.5000 mL | Freq: Once | INTRAMUSCULAR | Status: AC
  Administered 2017-04-03: 0.5 mL via INTRAMUSCULAR
  Filled 2017-04-03: qty 0.5

## 2017-04-03 MED ORDER — HYDROXYZINE HCL 25 MG PO TABS: 25.0000 mg | ORAL_TABLET | Freq: Four times a day (QID) | ORAL | Status: DC | PRN

## 2017-04-03 MED ORDER — TRAZODONE HCL 100 MG PO TABS
100.0000 mg | ORAL_TABLET | Freq: Every day | ORAL | Status: DC
  Administered 2017-04-03 – 2017-04-04 (×2): 100 mg via ORAL
  Filled 2017-04-03 (×2): qty 1

## 2017-04-03 MED ORDER — CLONIDINE HCL 0.1 MG PO TABS: 0.1000 mg | ORAL_TABLET | Freq: Every day | ORAL | Status: DC

## 2017-04-03 MED ORDER — SERTRALINE HCL 50 MG PO TABS
50.0000 mg | ORAL_TABLET | Freq: Every day | ORAL | Status: DC
  Administered 2017-04-03 – 2017-04-05 (×3): 50 mg via ORAL
  Filled 2017-04-03 (×3): qty 1

## 2017-04-03 MED ORDER — DICYCLOMINE HCL 20 MG PO TABS: 20.0000 mg | ORAL_TABLET | Freq: Four times a day (QID) | ORAL | Status: DC | PRN

## 2017-04-03 MED ORDER — PANTOPRAZOLE SODIUM 40 MG PO TBEC
40.0000 mg | DELAYED_RELEASE_TABLET | Freq: Every day | ORAL | Status: DC
  Administered 2017-04-03 – 2017-04-05 (×3): 40 mg via ORAL
  Filled 2017-04-03 (×3): qty 1

## 2017-04-03 MED ORDER — IBUPROFEN 200 MG PO TABS: 600.0000 mg | ORAL_TABLET | Freq: Three times a day (TID) | ORAL | Status: DC | PRN

## 2017-04-03 MED ORDER — NICOTINE 21 MG/24HR TD PT24
21.0000 mg | MEDICATED_PATCH | Freq: Every day | TRANSDERMAL | Status: DC
  Filled 2017-04-03: qty 1

## 2017-04-03 MED ORDER — ONDANSETRON HCL 4 MG PO TABS: 4.0000 mg | ORAL_TABLET | Freq: Three times a day (TID) | ORAL | Status: DC | PRN

## 2017-04-03 MED ORDER — ALBUTEROL SULFATE HFA 108 (90 BASE) MCG/ACT IN AERS: 2.0000 | INHALATION_SPRAY | Freq: Four times a day (QID) | RESPIRATORY_TRACT | Status: DC | PRN

## 2017-04-03 MED ORDER — METHOCARBAMOL 500 MG PO TABS: 500.0000 mg | ORAL_TABLET | Freq: Three times a day (TID) | ORAL | Status: DC | PRN

## 2017-04-03 MED ORDER — GABAPENTIN 100 MG PO CAPS
200.0000 mg | ORAL_CAPSULE | Freq: Two times a day (BID) | ORAL | Status: DC
  Administered 2017-04-03 – 2017-04-05 (×5): 200 mg via ORAL
  Filled 2017-04-03 (×5): qty 2

## 2017-04-03 MED ORDER — ONDANSETRON 4 MG PO TBDP: 4.0000 mg | ORAL_TABLET | Freq: Four times a day (QID) | ORAL | Status: DC | PRN

## 2017-04-03 MED ORDER — NAPROXEN 500 MG PO TABS: 500.0000 mg | ORAL_TABLET | Freq: Two times a day (BID) | ORAL | Status: DC | PRN

## 2017-04-03 MED ORDER — BACITRACIN-NEOMYCIN-POLYMYXIN 400-5-5000 EX OINT
TOPICAL_OINTMENT | Freq: Two times a day (BID) | CUTANEOUS | Status: DC
  Administered 2017-04-03: 17:00:00 via TOPICAL
  Filled 2017-04-03: qty 1
  Filled 2017-04-03: qty 3

## 2017-04-03 MED ORDER — LOPERAMIDE HCL 2 MG PO CAPS: 2.0000 mg | ORAL_CAPSULE | ORAL | Status: DC | PRN

## 2017-04-03 MED ORDER — CLONIDINE HCL 0.1 MG PO TABS: 0.1000 mg | ORAL_TABLET | ORAL | Status: DC
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
## 2017-04-03 NOTE — ED Notes (Signed)
Patient transferred to [facility] RM 39

## 2017-04-03 NOTE — ED Notes (Signed)
Bed: [facility] Expected date:  Expected time:  Means of arrival:  Comments: 
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
## 2017-04-03 NOTE — ED Notes (Signed)
Patient denies SI,HI and AVH at this time. Plan of care discussed. Encouragement and support provided and safety maintain. Q 15 min safety checks remain in place and video monitoring. 

## 2017-04-03 NOTE — Progress Notes (Signed)
Patient is pleasant; her behavior is cooperative.  She has superficial cuts to both wrists.  Ordered neosporin antibiotic ointment and bandaged cuts.  They appear to be healing well and she states she cut 2 days ago.  Patient ran out of her medication, mainly klonopin.  She was taking klonopin 1 mg three times a day.  Patient was informed that she would not get that here.  Patient states she took 1 oxycodone and does not take them regularly.  She is on the clonidine protocol.  She denies any thoughts of self harm today.  Patient has been drowsy since she has not slept in several days.

## 2017-04-04 DIAGNOSIS — F17210 Nicotine dependence, cigarettes, uncomplicated: Secondary | ICD-10-CM

## 2017-04-04 DIAGNOSIS — F332 Major depressive disorder, recurrent severe without psychotic features: Secondary | ICD-10-CM

## 2017-04-04 DIAGNOSIS — F1490 Cocaine use, unspecified, uncomplicated: Secondary | ICD-10-CM

## 2017-04-04 DIAGNOSIS — F1920 Other psychoactive substance dependence, uncomplicated: Secondary | ICD-10-CM

## 2017-04-04 MED ORDER — ACETAMINOPHEN 325 MG PO TABS: 650.0000 mg | ORAL_TABLET | ORAL | Status: DC | PRN

## 2017-04-04 MED ORDER — IBUPROFEN 800 MG PO TABS
800.0000 mg | ORAL_TABLET | Freq: Four times a day (QID) | ORAL | Status: DC | PRN
  Administered 2017-04-04: 800 mg via ORAL
  Filled 2017-04-04: qty 1
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
## 2017-04-04 NOTE — ED Notes (Signed)
Pt talking on hallway phone.  

## 2017-04-04 NOTE — ED Notes (Signed)
Pt behavior cooperative, sad affect. Pt denies SI/HI/AVH. COWS protocol in place. Encouragement and support provided. Special checks q 15 mins in place for safety, Video monitoring in place. Will continue to monitor.

## 2017-04-04 NOTE — ED Notes (Signed)
Pt c/o HA. This nurse notified EDP.

## 2017-04-04 NOTE — Progress Notes (Signed)
04/04/17 1342:  LRT went to pt room to offer activities, pt declined.   Caroll Rancher, LRT/CTRS

## 2017-04-04 NOTE — ED Notes (Signed)
Patient in  Bed resting at the beginning of the shift. He reported a good day. Denied SI/HI and denied hallucinations. Writer encouraged and supported patient. Q 15 minute check continues as ordered for safety.

## 2017-04-05 NOTE — Progress Notes (Signed)
04/05/17 1348:  Pt was lying down.  LRT introduced self to pt and offered activities.  Pt declined.     Caroll Rancher, LRT/CTRS

## 2017-04-05 NOTE — [facility] Suicide Risk Assessment (Signed)
Suicide Risk Assessment  Discharge Assessment   [facility] Discharge Suicide Risk Assessment   Principal Problem: Recurrent major depression-severe [facility]) Discharge Diagnoses:  Patient Active Problem List   Diagnosis Date Noted  . Polysubstance (including opioids) dependence with physiol dependence ([facility]) [F19.20] 04/03/2017  . HLD (hyperlipidemia) [E78.5] 12/03/2014  . Chest pain [R07.9] 12/03/2014  . Substance abuse [F19.10] 12/03/2014  . Hypokalemia [E87.6] 12/03/2014  . Pain in the chest [R07.9]   . Suicidal ideations [R45.851]   . Anxiety [F41.9]   . Suicidal behavior [R46.89]   . Recurrent major depression-severe ([facility]) [F33.2] 04/08/2014  . Suicidal thoughts [R45.851] 04/07/2014  . GERD (gastroesophageal reflux disease) [K21.9] 03/18/2014  . Vaginal discharge [N89.8] 03/18/2014  . Tobacco abuse [Z72.0] 03/18/2014  . Anxiety state, unspecified [F41.1] 03/18/2014  . Colitis [K52.9] 03/16/2014  . Panic attacks [F41.0] 01/11/2014  . GAD (generalized anxiety disorder) [F41.1] 01/11/2014  . Severe recurrent major depression without psychotic features ([facility]) [F33.2] 01/10/2014  . Pyelonephritis [N12] 01/03/2014  . Hypotension [I95.9] 12/31/2013  . UTI (urinary tract infection) [N39.0] 12/31/2013  . Chronic pelvic pain in female [R10.2, G89.29] 03/15/2011    Total Time spent with patient: 45 minutes  Musculoskeletal: Strength & Muscle Tone: within normal limits Gait & Station: normal Patient leans: N/A  Psychiatric Specialty Exam: Physical Exam  Constitutional: She is oriented to person, place, and time. She appears well-developed and well-nourished.  Respiratory: Effort normal.  Musculoskeletal: Normal range of motion.  Neurological: She is alert and oriented to person, place, and time.   Review of Systems  Psychiatric/Behavioral: Positive for depression, substance abuse and suicidal ideas (passive, situational). Negative for hallucinations and memory loss. The patient is  nervous/anxious. The patient does not have insomnia.   All other systems reviewed and are negative.  Blood pressure 129/82, pulse 80, temperature 98.8 F (37.1 C), temperature source Oral, resp. rate 19, last menstrual period 03/21/2017, SpO2 99 %.There is no height or weight on file to calculate BMI. General Appearance: Casual Eye Contact:  Good Speech:  Clear and Coherent and Normal Rate Volume:  Normal Mood:  Euthymic Affect:  Congruent Thought Process:  Coherent, Goal Directed and Linear Orientation:  Full (Time, Place, and Person) Thought Content:  Logical Suicidal Thoughts:  No Homicidal Thoughts:  No Memory:  Immediate;   Good Recent;   Good Remote;   Fair Judgement:  Fair Insight:  Fair Psychomotor Activity:  Normal Concentration:  Concentration: Good and Attention Span: Good Recall:  Good Fund of Knowledge:  Good Language:  Good Akathisia:  No Handed:  Right AIMS (if indicated):    Assets:  Communication Skills Desire for Improvement Financial Resources/Insurance Housing Physical Health Resilience Social Support ADL's:  Intact Cognition:  WNL  Mental Status Per Nursing Assessment::   On Admission:   suicidal ideation and anxiety/panic attacks  Demographic Factors:  Caucasian, Low socioeconomic status and Living alone  Loss Factors: Legal issues and Financial problems/change in socioeconomic status  Historical Factors: Prior suicide attempts and Impulsivity  Risk Reduction Factors:   Sense of responsibility to family and Positive social support  Continued Clinical Symptoms:  Severe Anxiety and/or Agitation Panic Attacks Depression:   Comorbid alcohol abuse/dependence Impulsivity Alcohol/Substance Abuse/Dependencies More than one psychiatric diagnosis Previous Psychiatric Diagnoses and Treatments  Cognitive Features That Contribute To Risk:  Closed-mindedness    Suicide Risk:  Minimal: No identifiable suicidal ideation.  Patients presenting  with no risk factors but with morbid ruminations; may be classified as minimal risk based on the severity  of the depressive symptoms    Plan Of Care/Follow-up recommendations:  Activity:  as tolerated Diet:  Heart Healthy  Laveda Abbe, NP 04/05/2017, 11:55 AM

## 2017-04-05 NOTE — ED Notes (Signed)
Pt d/c home per MD order. Discharge summary reviewed with pt. Pt verbalizes understanding. Pt denies SI/HI/AVH. Pt given bus pass per request. Pt signed for personal property and property returned. Pt signed e-signature. Ambulatory off unit with mht.
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
## 2017-11-29 ENCOUNTER — Other Ambulatory Visit: Payer: Self-pay

## 2017-11-29 ENCOUNTER — Emergency Department (HOSPITAL_COMMUNITY)
Admission: EM | Admit: 2017-11-29 | Discharge: 2017-11-29 | Disposition: A | Discharge status: Home / Self Care | Payer: Medicaid Other | Attending: Emergency Medicine | Admitting: Emergency Medicine

## 2017-11-29 ENCOUNTER — Encounter (HOSPITAL_COMMUNITY): Payer: Self-pay | Admitting: Emergency Medicine

## 2017-11-29 ENCOUNTER — Emergency Department (HOSPITAL_COMMUNITY): Payer: Medicaid Other

## 2017-11-29 DIAGNOSIS — R079 Chest pain, unspecified: Secondary | ICD-10-CM | POA: Diagnosis present

## 2017-11-29 DIAGNOSIS — F17210 Nicotine dependence, cigarettes, uncomplicated: Secondary | ICD-10-CM | POA: Diagnosis not present

## 2017-11-29 DIAGNOSIS — B349 Viral infection, unspecified: Secondary | ICD-10-CM | POA: Diagnosis not present

## 2017-11-29 DIAGNOSIS — R05 Cough: Secondary | ICD-10-CM

## 2017-11-29 DIAGNOSIS — Z79899 Other long term (current) drug therapy: Secondary | ICD-10-CM | POA: Diagnosis not present

## 2017-11-29 DIAGNOSIS — R059 Cough, unspecified: Secondary | ICD-10-CM

## 2017-11-29 DIAGNOSIS — R197 Diarrhea, unspecified: Secondary | ICD-10-CM

## 2017-11-29 DIAGNOSIS — R112 Nausea with vomiting, unspecified: Secondary | ICD-10-CM

## 2017-11-29 LAB — BASIC METABOLIC PANEL
Anion gap: 12 (ref 5–15)
BUN: 8 mg/dL (ref 6–20)
CALCIUM: 8.4 mg/dL — AB (ref 8.9–10.3)
CHLORIDE: 103 mmol/L (ref 101–111)
CO2: 20 mmol/L — ABNORMAL LOW (ref 22–32)
Creatinine, Ser: 0.62 mg/dL (ref 0.44–1.00)
Glucose, Bld: 102 mg/dL — ABNORMAL HIGH (ref 65–99)
Potassium: 3.8 mmol/L (ref 3.5–5.1)
Sodium: 135 mmol/L (ref 135–145)

## 2017-11-29 LAB — CBC
HCT: 41.9 % (ref 36.0–46.0)
Hemoglobin: 14.2 g/dL (ref 12.0–15.0)
MCH: 27.8 pg (ref 26.0–34.0)
MCHC: 33.9 g/dL (ref 30.0–36.0)
MCV: 82 fL (ref 78.0–100.0)
PLATELETS: 330 10*3/uL (ref 150–400)
RBC: 5.11 MIL/uL (ref 3.87–5.11)
RDW: 14.6 % (ref 11.5–15.5)
WBC: 10.9 10*3/uL — AB (ref 4.0–10.5)

## 2017-11-29 LAB — I-STAT TROPONIN, ED: TROPONIN I, POC: 0.01 ng/mL (ref 0.00–0.08)

## 2017-11-29 LAB — I-STAT BETA HCG BLOOD, ED (MC, WL, AP ONLY)

## 2017-11-29 MED ORDER — DEXTROMETHORPHAN HBR 15 MG/5ML PO SYRP: 10.0000 mL | ORAL_SOLUTION | Freq: Four times a day (QID) | ORAL | 0 refills | Status: DC | PRN

## 2017-11-29 MED ORDER — ONDANSETRON HCL 4 MG PO TABS: 4.0000 mg | ORAL_TABLET | Freq: Four times a day (QID) | ORAL | 0 refills | Status: DC

## 2017-11-29 MED ORDER — BENZONATATE 100 MG PO CAPS: 100.0000 mg | ORAL_CAPSULE | Freq: Three times a day (TID) | ORAL | 0 refills | Status: DC

## 2017-11-29 MED ORDER — SODIUM CHLORIDE 0.9 % IV BOLUS
500.0000 mL | Freq: Once | INTRAVENOUS | Status: AC
  Administered 2017-11-29: 500 mL via INTRAVENOUS

## 2017-11-29 MED ORDER — ALBUTEROL SULFATE HFA 108 (90 BASE) MCG/ACT IN AERS
1.0000 | INHALATION_SPRAY | Freq: Once | RESPIRATORY_TRACT | Status: AC
  Administered 2017-11-29: 1 via RESPIRATORY_TRACT
  Filled 2017-11-29: qty 6.7

## 2017-11-29 MED ORDER — ONDANSETRON HCL 4 MG PO TABS
4.0000 mg | ORAL_TABLET | Freq: Once | ORAL | Status: AC
Start: 2017-11-29 — End: 2017-11-29
  Administered 2017-11-29: 4 mg via ORAL
  Filled 2017-11-29: qty 1

## 2017-11-29 NOTE — ED Notes (Signed)
Pt departed in NAD, refused use of wheelchair. Given bus pass upon request.  
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
## 2017-11-29 NOTE — Discharge Instructions (Signed)
You likely have a viral illness.  This should be treated symptomatically. Use Tylenol or ibuprofen as needed for fevers or body aches. Use tessalon perles and cough syrup as needed.  Use zofran as needed fir nausea or vomiting.  Make sure you stay well-hydrated with water. Wash your hands frequently to prevent spread of infection. Follow-up with your psychiatrist for medication refills.  Return to the emergency room if you develop chest pain, difficulty breathing, or any new or worsening symptoms.

## 2017-11-29 NOTE — ED Triage Notes (Signed)
Patient states she is having chest pain and does not know why she said her complaint is "kidney infection" earlier with registration. Reports pain started 0800 today, sharp stabbing pain in center of chest. Also complains of a dry mouth.

## 2018-01-16 ENCOUNTER — Encounter (HOSPITAL_COMMUNITY): Payer: Self-pay | Admitting: *Deleted

## 2018-01-16 ENCOUNTER — Emergency Department (HOSPITAL_COMMUNITY): Payer: Medicaid Other

## 2018-01-16 ENCOUNTER — Other Ambulatory Visit: Payer: Self-pay

## 2018-01-16 ENCOUNTER — Emergency Department (HOSPITAL_COMMUNITY)
Admission: EM | Admit: 2018-01-16 | Discharge: 2018-01-17 | Disposition: A | Discharge status: Home / Self Care | Payer: Medicaid Other | Attending: Emergency Medicine | Admitting: Emergency Medicine

## 2018-01-16 DIAGNOSIS — Z0489 Encounter for examination and observation for other specified reasons: Secondary | ICD-10-CM | POA: Diagnosis not present

## 2018-01-16 DIAGNOSIS — Y939 Activity, unspecified: Secondary | ICD-10-CM | POA: Diagnosis not present

## 2018-01-16 DIAGNOSIS — S5012XA Contusion of left forearm, initial encounter: Secondary | ICD-10-CM | POA: Diagnosis not present

## 2018-01-16 DIAGNOSIS — Y929 Unspecified place or not applicable: Secondary | ICD-10-CM | POA: Insufficient documentation

## 2018-01-16 DIAGNOSIS — R51 Headache: Secondary | ICD-10-CM | POA: Insufficient documentation

## 2018-01-16 DIAGNOSIS — S59912A Unspecified injury of left forearm, initial encounter: Secondary | ICD-10-CM | POA: Diagnosis present

## 2018-01-16 DIAGNOSIS — F17210 Nicotine dependence, cigarettes, uncomplicated: Secondary | ICD-10-CM | POA: Insufficient documentation

## 2018-01-16 DIAGNOSIS — Y999 Unspecified external cause status: Secondary | ICD-10-CM | POA: Diagnosis not present

## 2018-01-16 DIAGNOSIS — Z79899 Other long term (current) drug therapy: Secondary | ICD-10-CM | POA: Diagnosis not present
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
## 2018-01-17 ENCOUNTER — Emergency Department (HOSPITAL_COMMUNITY): Payer: Medicaid Other

## 2018-01-17 MED ORDER — HYDROCODONE-ACETAMINOPHEN 5-325 MG PO TABS
1.0000 | ORAL_TABLET | Freq: Once | ORAL | Status: AC
  Administered 2018-01-17: 1 via ORAL
  Filled 2018-01-17: qty 1

## 2018-01-17 MED ORDER — NAPROXEN 250 MG PO TABS
500.0000 mg | ORAL_TABLET | Freq: Once | ORAL | Status: AC
  Administered 2018-01-17: 500 mg via ORAL
  Filled 2018-01-17: qty 2

## 2018-01-17 MED ORDER — HYDROCODONE-ACETAMINOPHEN 5-325 MG PO TABS: 1.0000 | ORAL_TABLET | Freq: Four times a day (QID) | ORAL | 0 refills | Status: DC | PRN

## 2018-01-17 MED ORDER — NAPROXEN 500 MG PO TABS: 500.0000 mg | ORAL_TABLET | Freq: Two times a day (BID) | ORAL | 0 refills | Status: DC

## 2018-01-17 NOTE — ED Notes (Signed)
E-signature not available. Patient verbalizes understanding of medications and discharge instructions. No further questions at this time.

## 2018-01-17 NOTE — Discharge Instructions (Signed)
Apply ice to areas of injury 3-4 times per day to limit swelling and inflammation.  Take naproxen as prescribed for pain.  You may supplement this with Norco as needed.  Do not drive or drink alcohol after taking Norco as it may make you drowsy and impair your judgment.  Wear a sling for assistance in elevation of your arm.  We also recommend a compressive dressing over your hematoma.  Follow-up with your primary care doctor in 1 week for further evaluation of your wounds.  You may return to the ED for new or concerning symptoms.
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
## 2018-01-17 NOTE — ED Notes (Signed)
Pt removed her own towel made c-collar

## 2018-01-24 ENCOUNTER — Emergency Department (HOSPITAL_COMMUNITY)
Admission: EM | Admit: 2018-01-24 | Discharge: 2018-01-24 | Disposition: A | Discharge status: Home / Self Care | Payer: Medicaid Other | Attending: Emergency Medicine | Admitting: Emergency Medicine

## 2018-01-24 ENCOUNTER — Emergency Department (HOSPITAL_COMMUNITY): Payer: Medicaid Other

## 2018-01-24 ENCOUNTER — Encounter (HOSPITAL_COMMUNITY): Payer: Self-pay

## 2018-01-24 ENCOUNTER — Other Ambulatory Visit: Payer: Self-pay

## 2018-01-24 DIAGNOSIS — F17210 Nicotine dependence, cigarettes, uncomplicated: Secondary | ICD-10-CM | POA: Insufficient documentation

## 2018-01-24 DIAGNOSIS — Y999 Unspecified external cause status: Secondary | ICD-10-CM | POA: Insufficient documentation

## 2018-01-24 DIAGNOSIS — Y939 Activity, unspecified: Secondary | ICD-10-CM | POA: Diagnosis not present

## 2018-01-24 DIAGNOSIS — Z79899 Other long term (current) drug therapy: Secondary | ICD-10-CM | POA: Diagnosis not present

## 2018-01-24 DIAGNOSIS — Y929 Unspecified place or not applicable: Secondary | ICD-10-CM | POA: Diagnosis not present

## 2018-01-24 DIAGNOSIS — S5012XA Contusion of left forearm, initial encounter: Secondary | ICD-10-CM | POA: Insufficient documentation

## 2018-01-24 LAB — CBC WITH DIFFERENTIAL/PLATELET
ABS IMMATURE GRANULOCYTES: 0 10*3/uL (ref 0.0–0.1)
Basophils Absolute: 0 10*3/uL (ref 0.0–0.1)
Basophils Relative: 1 %
EOS ABS: 0.1 10*3/uL (ref 0.0–0.7)
Eosinophils Relative: 2 %
HEMATOCRIT: 41.6 % (ref 36.0–46.0)
HEMOGLOBIN: 13.6 g/dL (ref 12.0–15.0)
IMMATURE GRANULOCYTES: 0 %
LYMPHS ABS: 1.7 10*3/uL (ref 0.7–4.0)
LYMPHS PCT: 20 %
MCH: 28.6 pg (ref 26.0–34.0)
MCHC: 32.7 g/dL (ref 30.0–36.0)
MCV: 87.4 fL (ref 78.0–100.0)
MONOS PCT: 7 %
Monocytes Absolute: 0.6 10*3/uL (ref 0.1–1.0)
NEUTROS PCT: 70 %
Neutro Abs: 6.1 10*3/uL (ref 1.7–7.7)
Platelets: 324 10*3/uL (ref 150–400)
RBC: 4.76 MIL/uL (ref 3.87–5.11)
RDW: 16.5 % — AB (ref 11.5–15.5)
WBC: 8.6 10*3/uL (ref 4.0–10.5)

## 2018-01-24 LAB — BASIC METABOLIC PANEL
Anion gap: 8 (ref 5–15)
BUN: 7 mg/dL (ref 6–20)
CHLORIDE: 108 mmol/L (ref 98–111)
CO2: 23 mmol/L (ref 22–32)
CREATININE: 0.65 mg/dL (ref 0.44–1.00)
Calcium: 8.9 mg/dL (ref 8.9–10.3)
GFR calc Af Amer: >60 mL/min (ref >60)
GFR calc non Af Amer: >60 mL/min (ref >60)
Glucose, Bld: 98 mg/dL (ref 70–99)
Potassium: 4.1 mmol/L (ref 3.5–5.1)
Sodium: 139 mmol/L (ref 135–145)

## 2018-01-24 LAB — CK: Total CK: 74 U/L (ref 38–234)

## 2018-01-24 MED ORDER — MELOXICAM 15 MG PO TABS: 15.0000 mg | ORAL_TABLET | Freq: Every day | ORAL | 0 refills | Status: DC

## 2018-01-24 MED ORDER — MORPHINE SULFATE (PF) 4 MG/ML IV SOLN: 2.0000 mg | Freq: Once | INTRAVENOUS | Status: DC

## 2018-01-24 MED ORDER — HYDROCODONE-ACETAMINOPHEN 5-325 MG PO TABS: 2.0000 | ORAL_TABLET | Freq: Once | ORAL | Status: DC

## 2018-01-24 MED ORDER — HYDROCODONE-ACETAMINOPHEN 5-325 MG PO TABS
2.0000 | ORAL_TABLET | Freq: Once | ORAL | Status: AC
  Administered 2018-01-24: 2 via ORAL
  Filled 2018-01-24: qty 2
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
## 2018-01-24 NOTE — Discharge Instructions (Addendum)
Keep ACE wrap on forearm.   Keep arm elevated whenever possible.
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
## 2018-01-24 NOTE — Procedures (Signed)
Procedure: Left forearm aspiration   Indication: Left forearm hematoma  Surgeon: Charma IgoMichael Tiler Brandis, PA-C  Assist: None  Anesthesia: None  EBL: None  Complications: None  Findings: After risks/benefits explained patient desires to undergo procedure. Consent obtained and time out performed. The left forearm was sterilely prepped and aspirated. <265ml old blood obtained. Pt tolerated the procedure well.    Freeman CaldronMichael J. Denym Rahimi, PA-C Orthopedic Surgery [phone]

## 2018-05-11 ENCOUNTER — Encounter (HOSPITAL_COMMUNITY): Payer: Self-pay

## 2018-05-11 ENCOUNTER — Other Ambulatory Visit: Payer: Self-pay

## 2018-05-11 ENCOUNTER — Emergency Department (HOSPITAL_COMMUNITY)
Admission: EM | Admit: 2018-05-11 | Discharge: 2018-05-11 | Disposition: A | Discharge status: Home / Self Care | Payer: Medicaid Other | Attending: Emergency Medicine | Admitting: Emergency Medicine

## 2018-05-11 ENCOUNTER — Emergency Department (HOSPITAL_COMMUNITY): Payer: Medicaid Other

## 2018-05-11 DIAGNOSIS — R079 Chest pain, unspecified: Secondary | ICD-10-CM | POA: Diagnosis present

## 2018-05-11 DIAGNOSIS — Z79899 Other long term (current) drug therapy: Secondary | ICD-10-CM | POA: Insufficient documentation

## 2018-05-11 DIAGNOSIS — F17210 Nicotine dependence, cigarettes, uncomplicated: Secondary | ICD-10-CM | POA: Diagnosis not present

## 2018-05-11 DIAGNOSIS — F1490 Cocaine use, unspecified, uncomplicated: Secondary | ICD-10-CM | POA: Diagnosis not present

## 2018-05-11 DIAGNOSIS — R0789 Other chest pain: Secondary | ICD-10-CM | POA: Diagnosis not present

## 2018-05-11 LAB — CBC
HCT: 41.1 % (ref 36.0–46.0)
HEMOGLOBIN: 13.2 g/dL (ref 12.0–15.0)
MCH: 28.9 pg (ref 26.0–34.0)
MCHC: 32.1 g/dL (ref 30.0–36.0)
MCV: 89.9 fL (ref 80.0–100.0)
Platelets: 288 10*3/uL (ref 150–400)
RBC: 4.57 MIL/uL (ref 3.87–5.11)
RDW: 13.9 % (ref 11.5–15.5)
WBC: 6.8 10*3/uL (ref 4.0–10.5)
nRBC: 0 % (ref 0.0–0.2)

## 2018-05-11 LAB — RAPID URINE DRUG SCREEN, HOSP PERFORMED
Amphetamines: NOT DETECTED
BARBITURATES: NOT DETECTED
Benzodiazepines: POSITIVE — AB
COCAINE: POSITIVE — AB
OPIATES: NOT DETECTED
Tetrahydrocannabinol: NOT DETECTED

## 2018-05-11 LAB — BASIC METABOLIC PANEL
ANION GAP: 8 (ref 5–15)
BUN: 6 mg/dL (ref 6–20)
CO2: 21 mmol/L — ABNORMAL LOW (ref 22–32)
Calcium: 8.5 mg/dL — ABNORMAL LOW (ref 8.9–10.3)
Chloride: 112 mmol/L — ABNORMAL HIGH (ref 98–111)
Creatinine, Ser: 0.76 mg/dL (ref 0.44–1.00)
GFR calc Af Amer: >60 mL/min (ref >60)
GFR calc non Af Amer: >60 mL/min (ref >60)
GLUCOSE: 78 mg/dL (ref 70–99)
POTASSIUM: 3.8 mmol/L (ref 3.5–5.1)
Sodium: 141 mmol/L (ref 135–145)

## 2018-05-11 LAB — URINALYSIS, ROUTINE W REFLEX MICROSCOPIC
BILIRUBIN URINE: NEGATIVE
Glucose, UA: NEGATIVE mg/dL
Hgb urine dipstick: NEGATIVE
KETONES UR: NEGATIVE mg/dL
Leukocytes, UA: NEGATIVE
Nitrite: NEGATIVE
PH: 6 (ref 5.0–8.0)
PROTEIN: NEGATIVE mg/dL
Specific Gravity, Urine: 1.001 — ABNORMAL LOW (ref 1.005–1.030)

## 2018-05-11 LAB — I-STAT TROPONIN, ED: Troponin i, poc: 0 ng/mL (ref 0.00–0.08)

## 2018-05-11 LAB — D-DIMER, QUANTITATIVE (NOT AT ARMC): D DIMER QUANT: 0.48 ug{FEU}/mL (ref 0.00–0.50)

## 2018-05-11 LAB — I-STAT BETA HCG BLOOD, ED (MC, WL, AP ONLY)

## 2018-05-11 MED ORDER — KETOROLAC TROMETHAMINE 30 MG/ML IJ SOLN
30.0000 mg | Freq: Once | INTRAMUSCULAR | Status: AC
  Administered 2018-05-11: 30 mg via INTRAVENOUS
  Filled 2018-05-11: qty 1

## 2018-05-11 MED ORDER — LORAZEPAM 2 MG/ML IJ SOLN
0.5000 mg | Freq: Once | INTRAMUSCULAR | Status: AC
  Administered 2018-05-11: 0.5 mg via INTRAVENOUS
  Filled 2018-05-11: qty 1

## 2018-05-11 MED ORDER — ONDANSETRON HCL 4 MG/2ML IJ SOLN
4.0000 mg | Freq: Once | INTRAMUSCULAR | Status: AC
  Administered 2018-05-11: 4 mg via INTRAVENOUS
  Filled 2018-05-11: qty 2

## 2018-05-11 MED ORDER — MORPHINE SULFATE (PF) 4 MG/ML IV SOLN
4.0000 mg | Freq: Once | INTRAVENOUS | Status: AC
  Administered 2018-05-11: 4 mg via INTRAVENOUS
  Filled 2018-05-11: qty 1

## 2018-05-11 NOTE — Discharge Instructions (Signed)
Work-up today is reassuring, EKG, labs and chest x-ray do not suggest an acute problem with your heart or lungs, because you are unable to stay for additional troponin testing there is still potential risk for a cardiac issue although I have low suspicion of this think your chest pain is probably musculoskeletal and related to your cough you may have a mild viral upper respiratory infection causing this.  You can treat your symptoms supportively with Tylenol and naproxen, you can also use over-the-counter cough medication such as Robitussin or Delsym.  You can use topical throat lozenges to help with sore throat.  Please follow-up with your primary care doctor in the next few days.  Return to the emergency department if you have persistent or worsening chest pain, shortness of breath, if you feel lightheaded or like he might pass out or any other new or concerning symptoms.
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
## 2018-05-11 NOTE — ED Notes (Signed)
Patient verbalizes understanding of discharge instructions. Opportunity for questioning and answers were provided. Armband removed by staff, pt discharged from ED. Pt ambulatory to lobby. Pt to take bus home.

## 2018-05-11 NOTE — ED Triage Notes (Signed)
Per [facility], pt arrives from home c/o chest pain radiating to left arm. Pain is sharp and 10/10 and has lasted for 3 hours. Pt had this pain 4 days ago but it went away. Pt received 324 ASA and 1 ntg. Pain is still 10/10. Pt has had 1 beer. EMS denies cardiac hx.

## 2018-05-11 NOTE — ED Notes (Signed)
Patient transported to X-ray 

## 2018-07-05 ENCOUNTER — Other Ambulatory Visit: Payer: Self-pay

## 2018-07-05 ENCOUNTER — Emergency Department (HOSPITAL_COMMUNITY)
Admission: EM | Admit: 2018-07-05 | Discharge: 2018-07-05 | Disposition: A | Discharge status: Home / Self Care | Payer: Medicaid Other | Attending: Emergency Medicine | Admitting: Emergency Medicine

## 2018-07-05 ENCOUNTER — Encounter (HOSPITAL_COMMUNITY): Payer: Self-pay | Admitting: Emergency Medicine

## 2018-07-05 ENCOUNTER — Emergency Department (HOSPITAL_COMMUNITY): Payer: Medicaid Other

## 2018-07-05 DIAGNOSIS — Z79899 Other long term (current) drug therapy: Secondary | ICD-10-CM | POA: Diagnosis not present

## 2018-07-05 DIAGNOSIS — R0602 Shortness of breath: Secondary | ICD-10-CM

## 2018-07-05 DIAGNOSIS — J209 Acute bronchitis, unspecified: Secondary | ICD-10-CM | POA: Diagnosis not present

## 2018-07-05 DIAGNOSIS — F17210 Nicotine dependence, cigarettes, uncomplicated: Secondary | ICD-10-CM | POA: Insufficient documentation

## 2018-07-05 DIAGNOSIS — J40 Bronchitis, not specified as acute or chronic: Secondary | ICD-10-CM

## 2018-07-05 LAB — BASIC METABOLIC PANEL
ANION GAP: 12 (ref 5–15)
BUN: 7 mg/dL (ref 6–20)
CALCIUM: 8.9 mg/dL (ref 8.9–10.3)
CO2: 19 mmol/L — ABNORMAL LOW (ref 22–32)
Chloride: 109 mmol/L (ref 98–111)
Creatinine, Ser: 0.65 mg/dL (ref 0.44–1.00)
GFR calc Af Amer: >60 mL/min (ref >60)
GFR calc non Af Amer: >60 mL/min (ref >60)
GLUCOSE: 104 mg/dL — AB (ref 70–99)
Potassium: 4 mmol/L (ref 3.5–5.1)
Sodium: 140 mmol/L (ref 135–145)

## 2018-07-05 LAB — I-STAT TROPONIN, ED: Troponin i, poc: 0.01 ng/mL (ref 0.00–0.08)

## 2018-07-05 LAB — CBC WITH DIFFERENTIAL/PLATELET
ABS IMMATURE GRANULOCYTES: 0.04 10*3/uL (ref 0.00–0.07)
Basophils Absolute: 0.1 10*3/uL (ref 0.0–0.1)
Basophils Relative: 1 %
EOS ABS: 0.2 10*3/uL (ref 0.0–0.5)
Eosinophils Relative: 2 %
HEMATOCRIT: 45.6 % (ref 36.0–46.0)
Hemoglobin: 14.8 g/dL (ref 12.0–15.0)
IMMATURE GRANULOCYTES: 0 %
LYMPHS ABS: 2.2 10*3/uL (ref 0.7–4.0)
Lymphocytes Relative: 22 %
MCH: 28.2 pg (ref 26.0–34.0)
MCHC: 32.5 g/dL (ref 30.0–36.0)
MCV: 86.9 fL (ref 80.0–100.0)
MONOS PCT: 5 %
Monocytes Absolute: 0.6 10*3/uL (ref 0.1–1.0)
NEUTROS PCT: 70 %
Neutro Abs: 7.1 10*3/uL (ref 1.7–7.7)
Platelets: 310 10*3/uL (ref 150–400)
RBC: 5.25 MIL/uL — ABNORMAL HIGH (ref 3.87–5.11)
RDW: 14.2 % (ref 11.5–15.5)
WBC: 10.2 10*3/uL (ref 4.0–10.5)
nRBC: 0 % (ref 0.0–0.2)

## 2018-07-05 LAB — URINALYSIS, ROUTINE W REFLEX MICROSCOPIC
BILIRUBIN URINE: NEGATIVE
Glucose, UA: NEGATIVE mg/dL
Ketones, ur: NEGATIVE mg/dL
Leukocytes, UA: NEGATIVE
NITRITE: NEGATIVE
PROTEIN: NEGATIVE mg/dL
Specific Gravity, Urine: 1.003 — ABNORMAL LOW (ref 1.005–1.030)
pH: 5 (ref 5.0–8.0)

## 2018-07-05 MED ORDER — PREDNISONE 20 MG PO TABS: 60.0000 mg | ORAL_TABLET | Freq: Every day | ORAL | 0 refills | Status: AC

## 2018-07-05 MED ORDER — PREDNISONE 20 MG PO TABS
60.0000 mg | ORAL_TABLET | Freq: Once | ORAL | Status: AC
  Administered 2018-07-05: 60 mg via ORAL
  Filled 2018-07-05: qty 3

## 2018-07-05 MED ORDER — ALBUTEROL SULFATE HFA 108 (90 BASE) MCG/ACT IN AERS
2.0000 | INHALATION_SPRAY | Freq: Once | RESPIRATORY_TRACT | Status: AC
  Administered 2018-07-05: 2 via RESPIRATORY_TRACT
  Filled 2018-07-05: qty 6.7

## 2018-07-05 MED ORDER — IPRATROPIUM-ALBUTEROL 0.5-2.5 (3) MG/3ML IN SOLN
3.0000 mL | Freq: Once | RESPIRATORY_TRACT | Status: AC
  Administered 2018-07-05: 3 mL via RESPIRATORY_TRACT
  Filled 2018-07-05: qty 3

## 2018-07-05 NOTE — ED Notes (Signed)
Patient ambulatory to bathroom with steady gait at this time 

## 2018-07-05 NOTE — ED Notes (Signed)
Patient transported to X-ray 

## 2018-07-05 NOTE — ED Triage Notes (Signed)
Pt states she has chest pain and short of breath. Pt states she is out of her inhaler. Pt states she might have a UTI.
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
## 2018-07-05 NOTE — ED Notes (Signed)
Patient verbalizes understanding of discharge instructions. Opportunity for questioning and answers were provided. Armband removed by staff, pt discharged from ED ambulatory.   

## 2018-09-03 ENCOUNTER — Encounter (HOSPITAL_COMMUNITY): Payer: Self-pay | Admitting: Nurse Practitioner

## 2018-09-03 ENCOUNTER — Emergency Department (HOSPITAL_COMMUNITY): Payer: Medicaid Other

## 2018-09-03 ENCOUNTER — Emergency Department (HOSPITAL_COMMUNITY)
Admission: EM | Admit: 2018-09-03 | Discharge: 2018-09-03 | Disposition: A | Discharge status: Home / Self Care | Payer: Medicaid Other | Attending: Emergency Medicine | Admitting: Emergency Medicine

## 2018-09-03 DIAGNOSIS — R519 Headache, unspecified: Secondary | ICD-10-CM

## 2018-09-03 DIAGNOSIS — R202 Paresthesia of skin: Secondary | ICD-10-CM | POA: Diagnosis not present

## 2018-09-03 DIAGNOSIS — Z79899 Other long term (current) drug therapy: Secondary | ICD-10-CM | POA: Insufficient documentation

## 2018-09-03 DIAGNOSIS — R112 Nausea with vomiting, unspecified: Secondary | ICD-10-CM | POA: Diagnosis not present

## 2018-09-03 DIAGNOSIS — R51 Headache: Secondary | ICD-10-CM | POA: Insufficient documentation

## 2018-09-03 DIAGNOSIS — R079 Chest pain, unspecified: Secondary | ICD-10-CM

## 2018-09-03 DIAGNOSIS — R05 Cough: Secondary | ICD-10-CM | POA: Diagnosis not present

## 2018-09-03 DIAGNOSIS — F17210 Nicotine dependence, cigarettes, uncomplicated: Secondary | ICD-10-CM | POA: Insufficient documentation

## 2018-09-03 DIAGNOSIS — R0789 Other chest pain: Secondary | ICD-10-CM | POA: Insufficient documentation

## 2018-09-03 DIAGNOSIS — R0602 Shortness of breath: Secondary | ICD-10-CM | POA: Diagnosis not present

## 2018-09-03 DIAGNOSIS — R000 Tachycardia, unspecified: Secondary | ICD-10-CM | POA: Diagnosis not present

## 2018-09-03 LAB — CBC
HCT: 45 % (ref 36.0–46.0)
Hemoglobin: 14.3 g/dL (ref 12.0–15.0)
MCH: 28.4 pg (ref 26.0–34.0)
MCHC: 31.8 g/dL (ref 30.0–36.0)
MCV: 89.5 fL (ref 80.0–100.0)
Platelets: 312 10*3/uL (ref 150–400)
RBC: 5.03 MIL/uL (ref 3.87–5.11)
RDW: 15.1 % (ref 11.5–15.5)
WBC: 7.8 10*3/uL (ref 4.0–10.5)
nRBC: 0 % (ref 0.0–0.2)

## 2018-09-03 LAB — BASIC METABOLIC PANEL
Anion gap: 12 (ref 5–15)
BUN: 8 mg/dL (ref 6–20)
CO2: 18 mmol/L — ABNORMAL LOW (ref 22–32)
Calcium: 8.1 mg/dL — ABNORMAL LOW (ref 8.9–10.3)
Chloride: 108 mmol/L (ref 98–111)
Creatinine, Ser: 0.68 mg/dL (ref 0.44–1.00)
GFR calc Af Amer: >60 mL/min (ref >60)
GFR calc non Af Amer: >60 mL/min (ref >60)
Glucose, Bld: 94 mg/dL (ref 70–99)
POTASSIUM: 3.3 mmol/L — AB (ref 3.5–5.1)
Sodium: 138 mmol/L (ref 135–145)

## 2018-09-03 LAB — POCT I-STAT TROPONIN I: Troponin i, poc: 0.01 ng/mL (ref 0.00–0.08)

## 2018-09-03 LAB — I-STAT BETA HCG BLOOD, ED (NOT ORDERABLE): I-stat hCG, quantitative: <5 m[IU]/mL (ref <5)

## 2018-09-03 LAB — D-DIMER, QUANTITATIVE: D-Dimer, Quant: 0.42 ug/mL-FEU (ref 0.00–0.50)

## 2018-09-03 MED ORDER — SODIUM CHLORIDE 0.9 % IV BOLUS
1000.0000 mL | Freq: Once | INTRAVENOUS | Status: AC
  Administered 2018-09-03: 1000 mL via INTRAVENOUS

## 2018-09-03 MED ORDER — DIPHENHYDRAMINE HCL 50 MG/ML IJ SOLN
25.0000 mg | Freq: Once | INTRAMUSCULAR | Status: AC
  Administered 2018-09-03: 25 mg via INTRAVENOUS
  Filled 2018-09-03: qty 1

## 2018-09-03 MED ORDER — DEXAMETHASONE SODIUM PHOSPHATE 10 MG/ML IJ SOLN
10.0000 mg | Freq: Once | INTRAMUSCULAR | Status: AC
  Administered 2018-09-03: 10 mg via INTRAVENOUS
  Filled 2018-09-03: qty 1

## 2018-09-03 MED ORDER — METOCLOPRAMIDE HCL 5 MG/ML IJ SOLN
10.0000 mg | Freq: Once | INTRAMUSCULAR | Status: AC
  Administered 2018-09-03: 10 mg via INTRAVENOUS
  Filled 2018-09-03: qty 2

## 2018-09-03 MED ORDER — SODIUM CHLORIDE 0.9% FLUSH
3.0000 mL | Freq: Once | INTRAVENOUS | Status: AC
  Administered 2018-09-03: 3 mL via INTRAVENOUS

## 2018-09-03 MED ORDER — KETOROLAC TROMETHAMINE 30 MG/ML IJ SOLN
30.0000 mg | Freq: Once | INTRAMUSCULAR | Status: AC
  Administered 2018-09-03: 30 mg via INTRAVENOUS
  Filled 2018-09-03: qty 1

## 2018-09-03 NOTE — ED Triage Notes (Signed)
Pt is c/o chest pain with associated N/V, mild shortness of breath and generalized tingling.

## 2018-09-03 NOTE — ED Notes (Signed)
EKG given to EDP,Kohut,MD., for review. 
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
## 2018-09-03 NOTE — Discharge Instructions (Addendum)
He was seen in the emergency department for chest pain and headache.  You had blood work EKG and chest x-ray that did not show an obvious cause of your symptoms.  It will be important for you to follow-up with your primary care doctor for further work-up of this.  Please return if any worsening symptoms.

## 2019-03-31 ENCOUNTER — Emergency Department (HOSPITAL_COMMUNITY): Payer: Medicaid Other

## 2019-03-31 ENCOUNTER — Emergency Department (HOSPITAL_COMMUNITY)
Admission: EM | Admit: 2019-03-31 | Discharge: 2019-03-31 | Disposition: A | Discharge status: Home / Self Care | Payer: Medicaid Other | Attending: Emergency Medicine | Admitting: Emergency Medicine

## 2019-03-31 ENCOUNTER — Encounter (HOSPITAL_COMMUNITY): Payer: Self-pay | Admitting: Emergency Medicine

## 2019-03-31 DIAGNOSIS — Z0471 Encounter for examination and observation following alleged adult physical abuse: Secondary | ICD-10-CM

## 2019-03-31 DIAGNOSIS — N760 Acute vaginitis: Secondary | ICD-10-CM | POA: Diagnosis not present

## 2019-03-31 DIAGNOSIS — S62252A Displaced fracture of neck of first metacarpal bone, left hand, initial encounter for closed fracture: Secondary | ICD-10-CM | POA: Insufficient documentation

## 2019-03-31 DIAGNOSIS — Y939 Activity, unspecified: Secondary | ICD-10-CM | POA: Diagnosis not present

## 2019-03-31 DIAGNOSIS — F17210 Nicotine dependence, cigarettes, uncomplicated: Secondary | ICD-10-CM | POA: Insufficient documentation

## 2019-03-31 DIAGNOSIS — S301XXA Contusion of abdominal wall, initial encounter: Secondary | ICD-10-CM | POA: Diagnosis not present

## 2019-03-31 DIAGNOSIS — B9689 Other specified bacterial agents as the cause of diseases classified elsewhere: Secondary | ICD-10-CM

## 2019-03-31 DIAGNOSIS — Y929 Unspecified place or not applicable: Secondary | ICD-10-CM | POA: Diagnosis not present

## 2019-03-31 DIAGNOSIS — Y999 Unspecified external cause status: Secondary | ICD-10-CM | POA: Insufficient documentation

## 2019-03-31 DIAGNOSIS — S62212A Bennett's fracture, left hand, initial encounter for closed fracture: Secondary | ICD-10-CM

## 2019-03-31 DIAGNOSIS — S6992XA Unspecified injury of left wrist, hand and finger(s), initial encounter: Secondary | ICD-10-CM | POA: Diagnosis present

## 2019-03-31 LAB — URINALYSIS, ROUTINE W REFLEX MICROSCOPIC
Bilirubin Urine: NEGATIVE
Glucose, UA: NEGATIVE mg/dL
Hgb urine dipstick: NEGATIVE
Ketones, ur: NEGATIVE mg/dL
Leukocytes,Ua: NEGATIVE
Nitrite: NEGATIVE
Protein, ur: NEGATIVE mg/dL
Specific Gravity, Urine: 1.001 — ABNORMAL LOW (ref 1.005–1.030)
pH: 6 (ref 5.0–8.0)

## 2019-03-31 LAB — WET PREP, GENITAL
Sperm: NONE SEEN
Trich, Wet Prep: NONE SEEN
Yeast Wet Prep HPF POC: NONE SEEN

## 2019-03-31 MED ORDER — AEROCHAMBER PLUS FLO-VU MEDIUM MISC
1.0000 | Freq: Once | Status: DC
  Administered 2019-03-31: 1
  Filled 2019-03-31: qty 1

## 2019-03-31 MED ORDER — AEROCHAMBER PLUS FLO-VU LARGE MISC: 1.0000 | Freq: Once | Status: DC

## 2019-03-31 MED ORDER — METRONIDAZOLE 500 MG PO TABS: 500.0000 mg | ORAL_TABLET | Freq: Two times a day (BID) | ORAL | 0 refills | Status: DC

## 2019-03-31 MED ORDER — ALBUTEROL SULFATE HFA 108 (90 BASE) MCG/ACT IN AERS
8.0000 | INHALATION_SPRAY | Freq: Once | RESPIRATORY_TRACT | Status: AC
  Administered 2019-03-31: 8 via RESPIRATORY_TRACT
  Filled 2019-03-31: qty 6.7

## 2019-03-31 MED ORDER — FLUCONAZOLE 200 MG PO TABS: 200.0000 mg | ORAL_TABLET | Freq: Every day | ORAL | 0 refills | Status: AC
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
## 2019-03-31 NOTE — ED Notes (Signed)
Pt called out asking for some pain medicine

## 2019-03-31 NOTE — Progress Notes (Signed)
Orthopedic Tech Progress Note Patient Details:  [name] [birth year] [id number]  Ortho Devices Type of Ortho Device: Thumb spica splint Splint Material: Plaster Ortho Device/Splint Location: ULE Ortho Device/Splint Interventions: Adjustment, Application, Ordered   Post Interventions Patient Tolerated: Well Instructions Provided: Care of device, Adjustment of device   Janit Pagan 03/31/2019, 6:39 PM

## 2019-03-31 NOTE — Discharge Instructions (Addendum)
You were seen in the ER for left thumb pain and vaginal discharge.  You have a fracture at the base of your thumb.  This is being treated initially with a splint to keep it stable.  The fracture appears to be close to the nearby joint and you need close follow-up with hand surgery as soon as possible.  Call hand surgery on Monday to schedule an appointment next week.  Alternate ibuprofen, acetaminophen as needed for pain.  Elevate.  Chart review shows you have had recent oxycodone prescriptions refilled, you can take these for breakthrough pain.  Return to the ER if there is severe sudden pain in your thumb or hand, decreased sensation or coolness, tingling  Pelvic swabs today showed bacterial vaginosis.  Take Flagyl as prescribed.
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
## 2019-03-31 NOTE — ED Notes (Signed)
Called pharmacy to request spacer

## 2019-03-31 NOTE — ED Notes (Signed)
Patient verbalizes understanding of discharge instructions. Opportunity for questioning and answers were provided. pt discharged from ED. Ambulatory by self  
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
## 2019-04-03 LAB — GC/CHLAMYDIA PROBE AMP (~~LOC~~) NOT AT ARMC
Chlamydia: POSITIVE — AB
Neisseria Gonorrhea: NEGATIVE

## 2019-04-04 ENCOUNTER — Telehealth: Payer: Self-pay | Admitting: Student

## 2019-04-04 DIAGNOSIS — A749 Chlamydial infection, unspecified: Secondary | ICD-10-CM

## 2019-04-04 MED ORDER — AZITHROMYCIN 500 MG PO TABS: 1000.0000 mg | ORAL_TABLET | Freq: Once | ORAL | 0 refills | Status: AC

## 2019-04-04 NOTE — Telephone Encounter (Addendum)
[name] tested positive for  Chlamydia. Patient was called by RN and allergies and pharmacy confirmed. Rx sent to pharmacy of choice.   Jorje Guild, NP 04/04/2019 1:38 PM       ----- Message from Bjorn Loser, RN sent at 04/04/2019  1:30 PM EDT ----- This patient tested positive for :  chlamydia  She"is allergic to :  "Lamictal , Risperidone, Tramadol and Aripiprazole" ,  I have informed the patient of her results and confirmed her pharmacy is correct in her chart. Please send Rx.   Thank you,   Bjorn Loser, RN   Results faxed to [facility] Department.

## 2022-03-31 ENCOUNTER — Inpatient Hospital Stay (HOSPITAL_COMMUNITY): Admission: RE | Admit: 2022-03-31 | Payer: Self-pay | Source: Ambulatory Visit

## 2022-06-03 ENCOUNTER — Other Ambulatory Visit: Payer: Self-pay

## 2022-06-03 ENCOUNTER — Encounter (HOSPITAL_COMMUNITY): Payer: Self-pay | Admitting: Emergency Medicine

## 2022-06-03 ENCOUNTER — Emergency Department (HOSPITAL_COMMUNITY)
Admission: EM | Admit: 2022-06-03 | Discharge: 2022-06-03 | Disposition: A | Discharge status: Home / Self Care | Payer: Medicaid Other | Attending: Emergency Medicine | Admitting: Emergency Medicine

## 2022-06-03 ENCOUNTER — Emergency Department (HOSPITAL_COMMUNITY): Payer: Medicaid Other

## 2022-06-03 DIAGNOSIS — S40011A Contusion of right shoulder, initial encounter: Secondary | ICD-10-CM | POA: Diagnosis not present

## 2022-06-03 DIAGNOSIS — M25511 Pain in right shoulder: Secondary | ICD-10-CM | POA: Diagnosis present

## 2022-06-03 DIAGNOSIS — M62838 Other muscle spasm: Secondary | ICD-10-CM | POA: Insufficient documentation

## 2022-06-03 MED ORDER — CYCLOBENZAPRINE HCL 10 MG PO TABS: 10.0000 mg | ORAL_TABLET | Freq: Two times a day (BID) | ORAL | 0 refills | Status: DC | PRN

## 2022-06-03 MED ORDER — HYDROMORPHONE HCL 1 MG/ML IJ SOLN
1.0000 mg | Freq: Once | INTRAMUSCULAR | Status: AC
  Administered 2022-06-03: 1 mg via INTRAMUSCULAR
  Filled 2022-06-03: qty 1

## 2022-06-03 MED ORDER — CYCLOBENZAPRINE HCL 10 MG PO TABS
10.0000 mg | ORAL_TABLET | Freq: Once | ORAL | Status: AC
  Administered 2022-06-03: 10 mg via ORAL
  Filled 2022-06-03: qty 1

## 2022-06-03 NOTE — Discharge Instructions (Signed)
X-ray showed no acute fracture or dislocation.  Overall suspect you have contusion/muscle spasm.  Continue pain medication as prescribed and I have added Flexeril to help.  Recommend Tylenol and ibuprofen use as well.
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
## 2022-10-25 ENCOUNTER — Emergency Department (HOSPITAL_COMMUNITY)
Admission: EM | Admit: 2022-10-25 | Discharge: 2022-10-26 | Disposition: A | Discharge status: Home / Self Care | Payer: Medicaid Other | Attending: Emergency Medicine | Admitting: Emergency Medicine

## 2022-10-25 DIAGNOSIS — R0789 Other chest pain: Secondary | ICD-10-CM | POA: Diagnosis present

## 2022-10-25 DIAGNOSIS — F17210 Nicotine dependence, cigarettes, uncomplicated: Secondary | ICD-10-CM | POA: Diagnosis not present

## 2022-10-25 DIAGNOSIS — R072 Precordial pain: Secondary | ICD-10-CM | POA: Diagnosis not present

## 2022-10-25 DIAGNOSIS — R0602 Shortness of breath: Secondary | ICD-10-CM | POA: Insufficient documentation

## 2022-10-25 NOTE — ED Triage Notes (Signed)
Pt brought by ems with chest pain starting about an hour ago. Pt described it a sharp throbbing pain 8/10 and radiates to right arm. Pt endorses light SOB, denies N/V. Pain betters with pressure and worsens with deep breath. PMH GERD. Family hx of MI (mother @ 21).

## 2022-10-26 ENCOUNTER — Emergency Department (HOSPITAL_COMMUNITY): Payer: Medicaid Other

## 2022-10-26 LAB — CBG MONITORING, ED: Glucose-Capillary: 104 mg/dL — ABNORMAL HIGH (ref 70–99)

## 2022-10-26 LAB — COMPREHENSIVE METABOLIC PANEL
ALT: 36 U/L (ref 0–44)
AST: 59 U/L — ABNORMAL HIGH (ref 15–41)
Albumin: 3.3 g/dL — ABNORMAL LOW (ref 3.5–5.0)
Alkaline Phosphatase: 42 U/L (ref 38–126)
Anion gap: 14 (ref 5–15)
BUN: <5 mg/dL — ABNORMAL LOW (ref 6–20)
CO2: 18 mmol/L — ABNORMAL LOW (ref 22–32)
Calcium: 8.4 mg/dL — ABNORMAL LOW (ref 8.9–10.3)
Chloride: 101 mmol/L (ref 98–111)
Creatinine, Ser: 0.56 mg/dL (ref 0.44–1.00)
GFR, Estimated: >60 mL/min (ref >60)
Glucose, Bld: 101 mg/dL — ABNORMAL HIGH (ref 70–99)
Potassium: 3.6 mmol/L (ref 3.5–5.1)
Sodium: 133 mmol/L — ABNORMAL LOW (ref 135–145)
Total Bilirubin: 0.3 mg/dL (ref 0.3–1.2)
Total Protein: 8.1 g/dL (ref 6.5–8.1)

## 2022-10-26 LAB — CBC
HCT: 38.5 % (ref 36.0–46.0)
Hemoglobin: 13.2 g/dL (ref 12.0–15.0)
MCH: 29.2 pg (ref 26.0–34.0)
MCHC: 34.3 g/dL (ref 30.0–36.0)
MCV: 85.2 fL (ref 80.0–100.0)
Platelets: 258 10*3/uL (ref 150–400)
RBC: 4.52 MIL/uL (ref 3.87–5.11)
RDW: 16.4 % — ABNORMAL HIGH (ref 11.5–15.5)
WBC: 6.3 10*3/uL (ref 4.0–10.5)
nRBC: 0 % (ref 0.0–0.2)

## 2022-10-26 LAB — TROPONIN I (HIGH SENSITIVITY)
Troponin I (High Sensitivity): <2 ng/L (ref <18)
Troponin I (High Sensitivity): <2 ng/L (ref <18)

## 2022-10-26 LAB — D-DIMER, QUANTITATIVE: D-Dimer, Quant: 0.49 ug/mL-FEU (ref 0.00–0.50)

## 2022-10-26 LAB — I-STAT BETA HCG BLOOD, ED (MC, WL, AP ONLY): I-stat hCG, quantitative: <5 m[IU]/mL (ref <5)

## 2022-10-26 LAB — LIPASE, BLOOD: Lipase: 57 U/L — ABNORMAL HIGH (ref 11–51)

## 2022-10-26 MED ORDER — SODIUM CHLORIDE 0.9 % IV BOLUS (SEPSIS)
1000.0000 mL | Freq: Once | INTRAVENOUS | Status: AC
  Administered 2022-10-26: 1000 mL via INTRAVENOUS

## 2022-10-26 MED ORDER — FENTANYL CITRATE PF 50 MCG/ML IJ SOSY
50.0000 ug | PREFILLED_SYRINGE | Freq: Once | INTRAMUSCULAR | Status: AC
  Administered 2022-10-26: 50 ug via INTRAVENOUS
  Filled 2022-10-26: qty 1

## 2022-10-26 MED ORDER — KETOROLAC TROMETHAMINE 15 MG/ML IJ SOLN
15.0000 mg | Freq: Once | INTRAMUSCULAR | Status: AC
  Administered 2022-10-26: 15 mg via INTRAVENOUS
  Filled 2022-10-26: qty 1

## 2022-10-26 NOTE — ED Provider Notes (Signed)
[place] EMERGENCY DEPARTMENT AT [facility] Provider Note   CSN: [id number] Arrival date & time: 10/25/22  2357     History  Chief Complaint  Patient presents with   Chest Pain    [name] is a 47 y.o. female.  The history is provided by the patient.  Patient with history of bipolar, GERD, hyperlipidemia presents with chest pain She reports approximately 1 hour ago she was watching TV when she had onset of sharp central chest pain that radiates to her right shoulder.  It is worse with breathing.  No fevers or vomiting.  No hemoptysis She reports shortness of breath and diaphoresis.  She had a brief episode of this pain about 2 days ago, but otherwise has been at her baseline She is a current cigarette smoker.  She reports family history of CAD.  No previous personal history of CAD/VTE.  No recent travel or surgery.  She does not take any estrogen products   Past Medical History:  Diagnosis Date   Anxiety    Anxiety    Bipolar 1 disorder (HCC)    Bronchitis    history of   Chronic bronchitis (HCC)    Cigarette smoker    Colitis    Depression    Depression    Endometriosis    GERD (gastroesophageal reflux disease)    uses otc acid reducer   Headache(784.0)    Hyperlipidemia    Renal disorder    kidney stones   UTI (lower urinary tract infection)     Home Medications Prior to Admission medications   Medication Sig Start Date End Date Taking? Authorizing Provider  albuterol (PROVENTIL HFA;VENTOLIN HFA) 108 (90 Base) MCG/ACT inhaler Inhale 2 puffs into the lungs every 6 (six) hours as needed for wheezing or shortness of breath.    [provider]  ibuprofen (ADVIL,MOTRIN) 200 MG tablet Take 400-600 mg by mouth every 6 (six) hours as needed for moderate pain.     [provider]  omeprazole (PRILOSEC) 40 MG capsule Take 1 capsule (40 mg total) by mouth daily. 10/28/15   Gilda Crease, MD  sertraline (ZOLOFT) 100 MG tablet Take 1  tablet (100 mg total) by mouth daily. Patient not taking: Reported on 03/31/2019 01/05/16   Jerre Simon, PA      Allergies    Aripiprazole, Risperidone and related, Tramadol, and Lamictal [lamotrigine]    Review of Systems   Review of Systems  Constitutional:  Negative for fever.  Respiratory:  Positive for shortness of breath.   Cardiovascular:  Positive for chest pain.    Physical Exam Updated Vital Signs BP 100/74   Pulse (!) 107   Temp 98.2 F (36.8 C) (Oral)   Resp 18   SpO2 96%  Physical Exam CONSTITUTIONAL: Appears older than stated age HEAD: Normocephalic/atraumatic EYES: EOMI/PERRL ENMT: Mucous membranes moist NECK: supple no meningeal signs SPINE/BACK:entire spine nontender CV: S1/S2 noted, no murmurs/rubs/gallops noted, tachycardia LUNGS: Lungs are clear to auscultation bilaterally, no apparent distress Chest-no bruising, no tenderness ABDOMEN: soft, nontender, no rebound or guarding, bowel sounds noted throughout abdomen GU:no cva tenderness NEURO: Pt is awake/alert/appropriate, moves all extremitiesx4.  No facial droop.   EXTREMITIES: pulses normal/equal, full ROM, no LE edema or tenderness SKIN: warm, color normal PSYCH: anxious  ED Results / Procedures / Treatments   Labs (all labs ordered are listed, but only abnormal results are displayed) Labs Reviewed  CBC - Abnormal; Notable for the following components:  Result Value   RDW 16.4 (*)    All other components within normal limits  COMPREHENSIVE METABOLIC PANEL - Abnormal; Notable for the following components:   Sodium 133 (*)    CO2 18 (*)    Glucose, Bld 101 (*)    BUN <5 (*)    Calcium 8.4 (*)    Albumin 3.3 (*)    AST 59 (*)    All other components within normal limits  LIPASE, BLOOD - Abnormal; Notable for the following components:   Lipase 57 (*)    All other components within normal limits  CBG MONITORING, ED - Abnormal; Notable for the following components:   Glucose-Capillary  104 (*)    All other components within normal limits  D-DIMER, QUANTITATIVE  I-STAT BETA HCG BLOOD, ED ([facility], [facility], [facility] ONLY)  TROPONIN I (HIGH SENSITIVITY)  TROPONIN I (HIGH SENSITIVITY)    EKG EKG Interpretation  Date/Time:  Tuesday October 26 2022 00:16:52 EDT Ventricular Rate:  111 PR Interval:  120 QRS Duration: 73 QT Interval:  353 QTC Calculation: 480 R Axis:   -7 Text Interpretation: Sinus tachycardia Probable left atrial enlargement Confirmed by Zadie Rhine ([id number]) on 10/26/2022 12:20:20 AM  Radiology DG Chest Portable 1 View  Result Date: 10/26/2022 CLINICAL DATA:  Chest pain EXAM: PORTABLE CHEST 1 VIEW COMPARISON:  09/03/2018 FINDINGS: The heart size and mediastinal contours are within normal limits. Both lungs are clear. The visualized skeletal structures are unremarkable. IMPRESSION: No active disease. Electronically Signed   By: Alcide Clever M.D.   On: 10/26/2022 00:35    Procedures Procedures    Medications Ordered in ED Medications  fentaNYL (SUBLIMAZE) injection 50 mcg (50 mcg Intravenous Given 10/26/22 0034)  sodium chloride 0.9 % bolus 1,000 mL (0 mLs Intravenous Stopped 10/26/22 0238)  ketorolac (TORADOL) 15 MG/ML injection 15 mg (15 mg Intravenous Given 10/26/22 0136)    ED Course/ Medical Decision Making/ A&P Clinical Course as of 10/26/22 0332  Tue Oct 26, 2022  0331 Patient stable in the ER.  She feels improved.  PE ruled out.  Low suspicion for ACS.  I have low suspicion for dissection.  She is safe for discharge home. [DW]    Clinical Course User Index [DW] Zadie Rhine, MD             HEART Score: 3                Medical Decision Making Amount and/or Complexity of Data Reviewed Labs: ordered. Radiology: ordered.  Risk Prescription drug management.   This patient presents to the ED for concern of chest pain, this involves an extensive number of treatment options, and is a complaint that carries with it a high risk of complications and  morbidity.  The differential diagnosis includes but is not limited to acute coronary syndrome, aortic dissection, pulmonary embolism, pericarditis, pneumothorax, pneumonia, myocarditis, pleurisy, esophageal rupture    Comorbidities that complicate the patient evaluation: Patient's presentation is complicated by their family history of CAD  Social Determinants of Health: Patient's  tobacco use   increases the complexity of managing their presentation  Additional history obtained: Records reviewed  outpatient record reviewed  Lab Tests: I Ordered, and personally interpreted labs.  The pertinent results include: Labs overall unremarkable  Imaging Studies ordered: I ordered imaging studies including X-ray chest   I independently visualized and interpreted imaging which showed no acute findings I agree with the radiologist interpretation  Cardiac Monitoring: The patient was maintained on a  cardiac monitor.  I personally viewed and interpreted the cardiac monitor which showed an underlying rhythm of:  sinus tachycardia  Medicines ordered and prescription drug management: I ordered medication including fentanyl  for pain as NTG did not improve her pain  Reevaluation of the patient after these medicines showed that the patient    improved  Test Considered: Patient is low risk / negative by heart score, therefore do not feel that cardiac admission is indicated.   Reevaluation: After the interventions noted above, I reevaluated the patient and found that they have :improved  Complexity of problems addressed: Patient's presentation is most consistent with  acute presentation with potential threat to life or bodily function  Disposition: After consideration of the diagnostic results and the patient's response to treatment,  I feel that the patent would benefit from discharge   .           Final Clinical Impression(s) / ED Diagnoses Final diagnoses:  Precordial pain    Rx /  DC Orders ED Discharge Orders     None         Zadie RhineWickline, Shaughnessy Gethers, MD 10/26/22 [phone]

## 2022-10-26 NOTE — Discharge Instructions (Signed)

## 2023-04-01 ENCOUNTER — Other Ambulatory Visit: Payer: Self-pay

## 2023-04-01 ENCOUNTER — Encounter (HOSPITAL_COMMUNITY): Payer: Self-pay

## 2023-04-01 ENCOUNTER — Inpatient Hospital Stay (HOSPITAL_COMMUNITY)
Admission: AD | Admit: 2023-04-01 | Discharge: 2023-04-05 | DRG: 885 | Disposition: A | Discharge status: Home / Self Care | Payer: MEDICAID | Source: Intra-hospital | Attending: Psychiatry | Admitting: Psychiatry

## 2023-04-01 ENCOUNTER — Encounter (HOSPITAL_COMMUNITY): Payer: Self-pay | Admitting: Nurse Practitioner

## 2023-04-01 ENCOUNTER — Emergency Department (HOSPITAL_COMMUNITY)
Admission: EM | Admit: 2023-04-01 | Discharge: 2023-04-01 | Disposition: A | Discharge status: Home / Self Care | Payer: MEDICAID | Attending: Emergency Medicine | Admitting: Emergency Medicine

## 2023-04-01 DIAGNOSIS — Y908 Blood alcohol level of 240 mg/100 ml or more: Secondary | ICD-10-CM | POA: Insufficient documentation

## 2023-04-01 DIAGNOSIS — S51819A Laceration without foreign body of unspecified forearm, initial encounter: Secondary | ICD-10-CM | POA: Insufficient documentation

## 2023-04-01 DIAGNOSIS — F3130 Bipolar disorder, current episode depressed, mild or moderate severity, unspecified: Secondary | ICD-10-CM | POA: Diagnosis present

## 2023-04-01 DIAGNOSIS — F17200 Nicotine dependence, unspecified, uncomplicated: Secondary | ICD-10-CM | POA: Insufficient documentation

## 2023-04-01 DIAGNOSIS — Z8249 Family history of ischemic heart disease and other diseases of the circulatory system: Secondary | ICD-10-CM

## 2023-04-01 DIAGNOSIS — F1120 Opioid dependence, uncomplicated: Secondary | ICD-10-CM | POA: Diagnosis present

## 2023-04-01 DIAGNOSIS — F17210 Nicotine dependence, cigarettes, uncomplicated: Secondary | ICD-10-CM | POA: Diagnosis present

## 2023-04-01 DIAGNOSIS — F603 Borderline personality disorder: Secondary | ICD-10-CM | POA: Diagnosis present

## 2023-04-01 DIAGNOSIS — J42 Unspecified chronic bronchitis: Secondary | ICD-10-CM | POA: Diagnosis present

## 2023-04-01 DIAGNOSIS — Z789 Other specified health status: Secondary | ICD-10-CM | POA: Insufficient documentation

## 2023-04-01 DIAGNOSIS — Z79899 Other long term (current) drug therapy: Secondary | ICD-10-CM | POA: Diagnosis not present

## 2023-04-01 DIAGNOSIS — F1090 Alcohol use, unspecified, uncomplicated: Secondary | ICD-10-CM | POA: Insufficient documentation

## 2023-04-01 DIAGNOSIS — Z23 Encounter for immunization: Secondary | ICD-10-CM | POA: Diagnosis not present

## 2023-04-01 DIAGNOSIS — Z9151 Personal history of suicidal behavior: Secondary | ICD-10-CM | POA: Diagnosis not present

## 2023-04-01 DIAGNOSIS — R45851 Suicidal ideations: Secondary | ICD-10-CM | POA: Diagnosis present

## 2023-04-01 DIAGNOSIS — F332 Major depressive disorder, recurrent severe without psychotic features: Secondary | ICD-10-CM | POA: Diagnosis present

## 2023-04-01 DIAGNOSIS — F1010 Alcohol abuse, uncomplicated: Secondary | ICD-10-CM | POA: Diagnosis present

## 2023-04-01 DIAGNOSIS — E785 Hyperlipidemia, unspecified: Secondary | ICD-10-CM | POA: Diagnosis present

## 2023-04-01 DIAGNOSIS — Z7289 Other problems related to lifestyle: Secondary | ICD-10-CM

## 2023-04-01 DIAGNOSIS — F411 Generalized anxiety disorder: Secondary | ICD-10-CM | POA: Diagnosis present

## 2023-04-01 DIAGNOSIS — X789XXA Intentional self-harm by unspecified sharp object, initial encounter: Secondary | ICD-10-CM | POA: Insufficient documentation

## 2023-04-01 DIAGNOSIS — Z87442 Personal history of urinary calculi: Secondary | ICD-10-CM

## 2023-04-01 DIAGNOSIS — F319 Bipolar disorder, unspecified: Principal | ICD-10-CM | POA: Diagnosis present

## 2023-04-01 DIAGNOSIS — Z9152 Personal history of nonsuicidal self-harm: Secondary | ICD-10-CM | POA: Diagnosis not present

## 2023-04-01 DIAGNOSIS — K219 Gastro-esophageal reflux disease without esophagitis: Secondary | ICD-10-CM | POA: Diagnosis present

## 2023-04-01 DIAGNOSIS — Z818 Family history of other mental and behavioral disorders: Secondary | ICD-10-CM

## 2023-04-01 LAB — CBC WITH DIFFERENTIAL/PLATELET
Abs Immature Granulocytes: 0.02 10*3/uL (ref 0.00–0.07)
Basophils Absolute: 0.1 10*3/uL (ref 0.0–0.1)
Basophils Relative: 2 %
Eosinophils Absolute: 0.2 10*3/uL (ref 0.0–0.5)
Eosinophils Relative: 3 %
HCT: 42.7 % (ref 36.0–46.0)
Hemoglobin: 14.3 g/dL (ref 12.0–15.0)
Immature Granulocytes: 0 %
Lymphocytes Relative: 52 %
Lymphs Abs: 4.3 10*3/uL — ABNORMAL HIGH (ref 0.7–4.0)
MCH: 29.7 pg (ref 26.0–34.0)
MCHC: 33.5 g/dL (ref 30.0–36.0)
MCV: 88.6 fL (ref 80.0–100.0)
Monocytes Absolute: 1 10*3/uL (ref 0.1–1.0)
Monocytes Relative: 12 %
Neutro Abs: 2.5 10*3/uL (ref 1.7–7.7)
Neutrophils Relative %: 31 %
Platelets: 355 10*3/uL (ref 150–400)
RBC: 4.82 MIL/uL (ref 3.87–5.11)
RDW: 15.8 % — ABNORMAL HIGH (ref 11.5–15.5)
WBC: 8.2 10*3/uL (ref 4.0–10.5)
nRBC: 0 % (ref 0.0–0.2)

## 2023-04-01 LAB — COMPREHENSIVE METABOLIC PANEL
ALT: 37 U/L (ref 0–44)
AST: 72 U/L — ABNORMAL HIGH (ref 15–41)
Albumin: 3.7 g/dL (ref 3.5–5.0)
Alkaline Phosphatase: 59 U/L (ref 38–126)
Anion gap: 16 — ABNORMAL HIGH (ref 5–15)
BUN: <5 mg/dL — ABNORMAL LOW (ref 6–20)
CO2: 17 mmol/L — ABNORMAL LOW (ref 22–32)
Calcium: 8.4 mg/dL — ABNORMAL LOW (ref 8.9–10.3)
Chloride: 99 mmol/L (ref 98–111)
Creatinine, Ser: 0.55 mg/dL (ref 0.44–1.00)
GFR, Estimated: >60 mL/min (ref >60)
Glucose, Bld: 94 mg/dL (ref 70–99)
Potassium: 3.6 mmol/L (ref 3.5–5.1)
Sodium: 132 mmol/L — ABNORMAL LOW (ref 135–145)
Total Bilirubin: 0.5 mg/dL (ref 0.3–1.2)
Total Protein: 9.2 g/dL — ABNORMAL HIGH (ref 6.5–8.1)

## 2023-04-01 LAB — RAPID URINE DRUG SCREEN, HOSP PERFORMED
Amphetamines: NOT DETECTED
Barbiturates: NOT DETECTED
Benzodiazepines: POSITIVE — AB
Cocaine: NOT DETECTED
Opiates: NOT DETECTED
Tetrahydrocannabinol: NOT DETECTED

## 2023-04-01 LAB — HCG, QUANTITATIVE, PREGNANCY: hCG, Beta Chain, Quant, S: 1 m[IU]/mL (ref <5)

## 2023-04-01 LAB — ETHANOL: Alcohol, Ethyl (B): 247 mg/dL — ABNORMAL HIGH (ref <10)

## 2023-04-01 MED ORDER — SERTRALINE HCL 50 MG PO TABS
100.0000 mg | ORAL_TABLET | Freq: Every day | ORAL | Status: DC
  Administered 2023-04-01: 100 mg via ORAL
  Filled 2023-04-01: qty 2

## 2023-04-01 MED ORDER — PANTOPRAZOLE SODIUM 40 MG PO TBEC
40.0000 mg | DELAYED_RELEASE_TABLET | Freq: Every day | ORAL | Status: DC
  Administered 2023-04-02 – 2023-04-05 (×4): 40 mg via ORAL
  Filled 2023-04-01 (×5): qty 1

## 2023-04-01 MED ORDER — LORAZEPAM 1 MG PO TABS: 1.0000 mg | ORAL_TABLET | ORAL | Status: DC | PRN

## 2023-04-01 MED ORDER — HALOPERIDOL LACTATE 5 MG/ML IJ SOLN: 5.0000 mg | Freq: Three times a day (TID) | INTRAMUSCULAR | Status: DC | PRN

## 2023-04-01 MED ORDER — DIPHENHYDRAMINE HCL 25 MG PO CAPS: 50.0000 mg | ORAL_CAPSULE | Freq: Three times a day (TID) | ORAL | Status: DC | PRN

## 2023-04-01 MED ORDER — ALBUTEROL SULFATE HFA 108 (90 BASE) MCG/ACT IN AERS: 2.0000 | INHALATION_SPRAY | Freq: Four times a day (QID) | RESPIRATORY_TRACT | Status: DC | PRN

## 2023-04-01 MED ORDER — OXYCODONE-ACETAMINOPHEN 10-325 MG PO TABS: 1.0000 | ORAL_TABLET | Freq: Four times a day (QID) | ORAL | Status: DC | PRN

## 2023-04-01 MED ORDER — SERTRALINE HCL 100 MG PO TABS
100.0000 mg | ORAL_TABLET | Freq: Every day | ORAL | Status: DC
  Administered 2023-04-02 – 2023-04-05 (×4): 100 mg via ORAL
  Filled 2023-04-01 (×5): qty 1

## 2023-04-01 MED ORDER — LORAZEPAM 1 MG PO TABS: 2.0000 mg | ORAL_TABLET | Freq: Three times a day (TID) | ORAL | Status: DC | PRN

## 2023-04-01 MED ORDER — IBUPROFEN 200 MG PO TABS
400.0000 mg | ORAL_TABLET | Freq: Four times a day (QID) | ORAL | Status: DC | PRN
  Administered 2023-04-01: 600 mg via ORAL
  Filled 2023-04-01: qty 3

## 2023-04-01 MED ORDER — OXYCODONE-ACETAMINOPHEN 5-325 MG PO TABS
1.0000 | ORAL_TABLET | Freq: Four times a day (QID) | ORAL | Status: DC | PRN
  Administered 2023-04-02: 1 via ORAL
  Filled 2023-04-01 (×2): qty 1

## 2023-04-01 MED ORDER — ALBUTEROL SULFATE (2.5 MG/3ML) 0.083% IN NEBU: 2.5000 mg | INHALATION_SOLUTION | Freq: Four times a day (QID) | RESPIRATORY_TRACT | Status: DC | PRN

## 2023-04-01 MED ORDER — OXYCODONE HCL 5 MG PO TABS
5.0000 mg | ORAL_TABLET | Freq: Four times a day (QID) | ORAL | Status: DC | PRN
  Administered 2023-04-01: 5 mg via ORAL
  Filled 2023-04-01: qty 1

## 2023-04-01 MED ORDER — HALOPERIDOL 5 MG PO TABS: 5.0000 mg | ORAL_TABLET | Freq: Three times a day (TID) | ORAL | Status: DC | PRN

## 2023-04-01 MED ORDER — BACITRACIN ZINC 500 UNIT/GM EX OINT
TOPICAL_OINTMENT | Freq: Two times a day (BID) | CUTANEOUS | Status: DC
  Administered 2023-04-01 (×2): 1 via TOPICAL
  Filled 2023-04-01 (×2): qty 0.9

## 2023-04-01 MED ORDER — LORAZEPAM 2 MG/ML IJ SOLN: 2.0000 mg | Freq: Three times a day (TID) | INTRAMUSCULAR | Status: DC | PRN

## 2023-04-01 MED ORDER — LORAZEPAM 1 MG PO TABS: 0.0000 mg | ORAL_TABLET | Freq: Two times a day (BID) | ORAL | Status: DC

## 2023-04-01 MED ORDER — ACETAMINOPHEN 325 MG PO TABS: 650.0000 mg | ORAL_TABLET | Freq: Four times a day (QID) | ORAL | Status: DC | PRN

## 2023-04-01 MED ORDER — DIPHENHYDRAMINE HCL 50 MG/ML IJ SOLN: 50.0000 mg | Freq: Three times a day (TID) | INTRAMUSCULAR | Status: DC | PRN

## 2023-04-01 MED ORDER — FOLIC ACID 1 MG PO TABS
1.0000 mg | ORAL_TABLET | Freq: Every day | ORAL | Status: DC
  Administered 2023-04-01 – 2023-04-05 (×5): 1 mg via ORAL
  Filled 2023-04-01 (×7): qty 1

## 2023-04-01 MED ORDER — OXYCODONE HCL 5 MG PO TABS
5.0000 mg | ORAL_TABLET | Freq: Four times a day (QID) | ORAL | Status: DC | PRN
  Administered 2023-04-02: 5 mg via ORAL
  Filled 2023-04-01 (×2): qty 1

## 2023-04-01 MED ORDER — ALUM & MAG HYDROXIDE-SIMETH 200-200-20 MG/5ML PO SUSP: 30.0000 mL | ORAL | Status: DC | PRN

## 2023-04-01 MED ORDER — VITAMIN B-1 100 MG PO TABS
100.0000 mg | ORAL_TABLET | Freq: Every day | ORAL | Status: DC
  Administered 2023-04-01 – 2023-04-05 (×5): 100 mg via ORAL
  Filled 2023-04-01 (×7): qty 1

## 2023-04-01 MED ORDER — LORAZEPAM 1 MG PO TABS: 1.0000 mg | ORAL_TABLET | Freq: Four times a day (QID) | ORAL | Status: DC | PRN

## 2023-04-01 MED ORDER — OXYCODONE-ACETAMINOPHEN 5-325 MG PO TABS
1.0000 | ORAL_TABLET | Freq: Four times a day (QID) | ORAL | Status: DC | PRN
  Administered 2023-04-01: 1 via ORAL
  Filled 2023-04-01: qty 1

## 2023-04-01 MED ORDER — PANTOPRAZOLE SODIUM 40 MG PO TBEC
40.0000 mg | DELAYED_RELEASE_TABLET | Freq: Every day | ORAL | Status: DC
  Administered 2023-04-01: 40 mg via ORAL
  Filled 2023-04-01: qty 1

## 2023-04-01 MED ORDER — THIAMINE HCL 100 MG/ML IJ SOLN: 100.0000 mg | Freq: Every day | INTRAMUSCULAR | Status: DC

## 2023-04-01 MED ORDER — ADULT MULTIVITAMIN W/MINERALS CH
1.0000 | ORAL_TABLET | Freq: Every day | ORAL | Status: DC
  Administered 2023-04-01 – 2023-04-05 (×5): 1 via ORAL
  Filled 2023-04-01 (×7): qty 1

## 2023-04-01 MED ORDER — ACETAMINOPHEN 325 MG PO TABS
ORAL_TABLET | ORAL | Status: AC
  Filled 2023-04-01: qty 2

## 2023-04-01 MED ORDER — TETANUS-DIPHTHERIA TOXOIDS TD 5-2 LFU IM INJ
0.5000 mL | INJECTION | Freq: Once | INTRAMUSCULAR | Status: AC
  Administered 2023-04-01: 0.5 mL via INTRAMUSCULAR
  Filled 2023-04-01: qty 0.5

## 2023-04-01 MED ORDER — LORAZEPAM 1 MG PO TABS
0.0000 mg | ORAL_TABLET | Freq: Four times a day (QID) | ORAL | Status: DC
  Administered 2023-04-01: 2 mg via ORAL
  Administered 2023-04-02: 1 mg via ORAL
  Administered 2023-04-02 (×2): 2 mg via ORAL
  Administered 2023-04-03: 1 mg via ORAL
  Filled 2023-04-01: qty 2
  Filled 2023-04-01: qty 1
  Filled 2023-04-01: qty 2
  Filled 2023-04-01: qty 1
  Filled 2023-04-01: qty 2

## 2023-04-01 MED ORDER — TRAZODONE HCL 50 MG PO TABS
50.0000 mg | ORAL_TABLET | Freq: Every evening | ORAL | Status: DC | PRN
  Administered 2023-04-02 – 2023-04-04 (×3): 50 mg via ORAL
  Filled 2023-04-01: qty 1
  Filled 2023-04-01: qty 7
  Filled 2023-04-01 (×3): qty 1

## 2023-04-01 MED ORDER — ACETAMINOPHEN 325 MG PO TABS
650.0000 mg | ORAL_TABLET | Freq: Once | ORAL | Status: AC
  Administered 2023-04-01: 650 mg via ORAL

## 2023-04-01 MED ORDER — BACITRACIN ZINC 500 UNIT/GM EX OINT
TOPICAL_OINTMENT | Freq: Two times a day (BID) | CUTANEOUS | Status: DC
  Filled 2023-04-01 (×17): qty 0.9

## 2023-04-01 NOTE — [facility] Group Notes (Signed)
[facility] Group Notes:  (Nursing/MHT/Case Management/Adjunct)  Date:  04/01/2023  Time:  9:46 PM  Type of Therapy:   Wrap-Up Group  Participation Level:  Did Not Attend  [name] 04/01/2023, 9:46 PM
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
## 2023-04-01 NOTE — Progress Notes (Addendum)
   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 Admitting provider, Julieanne Cotton, NP notified. Order for alcohol detox protocol placed. Administered PO Ativan 2mg  to patient per MAR.

## 2023-04-01 NOTE — ED Notes (Addendum)
Pt has been provided with burgundy scrubs to change into and pt belonging bags by this NT.

## 2023-04-01 NOTE — ED Notes (Signed)
Called [facility] for transport to [facility]

## 2023-04-01 NOTE — Progress Notes (Addendum)
[facility] Admission Note:  [name] is a 47 year old female involuntarily admitted to [facility] on 04/01/23 following a suicide attempt by cutting her left wrist with a swiss army knife. Patient report that she felt very anxious and had a panic attack prior to cutting herself. Patient reports that her anxiety got so high that she cut herself to relieve her stress. Patient received stitches in the ED for her laceration.   Patient has had 2 prior suicide attempts in the past. Patient reports that her stressors are mainly related to financial and occupational issues. Patient reports that she lives in an apartment with her boyfriend of 10 years. Patient denied drug use however her UDS is positive for benzodiazepines. Pt reports that she smokes 1 pack of cigarettes per day. Patient reports that she drinks 1x per week and usually has about 3-4 beers. Patient's blood alcohol level was 247 upon admission to ED.   Patient visibly detoxing from alcohol upon admission to [facility]. Patient presents with elevated BP; 170/108, sweating, tremors, headache, and high anxiety. Patient's CIWA score = 14 upon admission, 2mg  of Ativan administered per MAR. Pt denies SI/HI/AVH upon admission.  Patient oriented to the unit and provided with a dinner tray. Q 15 minute safety checks are in place.

## 2023-04-01 NOTE — Progress Notes (Signed)
Pt has been accepted to [facility] TODAY 04/01/2023. Bed assignment: 306-1  Pt meets inpatient criteria per Dahlia Byes, NP  Attending Physician will be Phineas Inches, MD  Report can be called to: - Adult unit: [phone]  Pt can arrive after 4 PM  Care Team Notified: [facility], RN, Hyman Bower, RN, and Dahlia Byes, NP   [place], [place]  04/01/2023 1:11 PM

## 2023-04-01 NOTE — ED Provider Notes (Addendum)
Emergency Medicine Observation Re-evaluation Note  [name] is a 47 y.o. female, seen on rounds today.  Pt initially presented to the ED for complaints of Psychiatric Evaluation Currently, the patient is asleep.  Pt came to the ED today with multiple self-inflicted lacerations.  She denies si, but has never done this in the past.  TTS did see her and recommended inpatient admission.  Physical Exam  BP (!) 167/121 (BP Location: Right Arm)   Pulse (!) 113   Temp 98.8 F (37.1 C) (Oral)   Resp 18   Ht 5\' 4"  (1.626 m)   Wt 79.8 kg   SpO2 96%   BMI 30.21 kg/m  Physical Exam General: asleep Cardiac: rr Lungs: clear Psych: asleep  ED Course / MDM  EKG:   I have reviewed the labs performed to date as well as medications administered while in observation.  Recent changes in the last 24 hours include TTS eval.  Plan  Current plan is for inpatient tx.    Jacalyn Lefevre, MD 04/01/23 [phone]  Pt is now awake and wants to leave.  There is a concern that the self harm was a suicide attempt, so IVC papers have been filled out.  Pt is aware.  She requested that her pain medication be ordered, so that is done.    Jacalyn Lefevre, MD 04/01/23 1030

## 2023-04-01 NOTE — ED Notes (Signed)
Provider is aware of HR of 117

## 2023-04-01 NOTE — ED Notes (Signed)
Called report to [employer] at [facility]

## 2023-04-01 NOTE — ED Triage Notes (Signed)
Pt presents via EMS c/o self-inflicted lacerations to left wrist. Reports not suicidal at this time. Reports "not the best living situation at home". Bleeding of wrist controlled at this time. 2 lacerations noted to left wrist. PA at bedside during triage.

## 2023-04-01 NOTE — ED Notes (Signed)
One pt belongings bag placed in locker # [phone]

## 2023-04-01 NOTE — Tx Team (Addendum)
Initial Treatment Plan 04/01/2023 6:16 PM [name] [id number]    PATIENT STRESSORS: Financial difficulties   Occupational concerns   Substance abuse     PATIENT STRENGTHS: Capable of independent living  Supportive family/friends    PATIENT IDENTIFIED PROBLEMS:   "2 self-inflicted lacerations on left wrist"    "Increased stress at home with boyfriend"    "Not medicated for Bipolar"           DISCHARGE CRITERIA:  Improved stabilization in mood, thinking, and/or behavior  PRELIMINARY DISCHARGE PLAN: Outpatient therapy  PATIENT/FAMILY INVOLVEMENT: This treatment plan has been presented to and reviewed with the patient, [name]. The patient has been given the opportunity to ask questions and make suggestions.  Earma Reading Safaa Stingley, RN 04/01/2023, 6:16 PM

## 2023-04-01 NOTE — Plan of Care (Signed)
Problem: Education: Goal: Knowledge of [facility] Education information/materials will improve Outcome: Progressing Goal: Verbalization of understanding the information provided will improve Outcome: Progressing

## 2023-04-01 NOTE — BH Assessment (Signed)
Comprehensive Clinical Assessment (CCA) Note  04/01/2023 [name] [id number]  Disposition:  Per Rockney Ghee, NP, Inpatient is recommended  The patient demonstrates the following risk factors for suicide: Chronic risk factors for suicide include: psychiatric disorder of bipolar disorder, substance use disorder, previous suicide attempts x2, and previous self-harm by cutting . Acute risk factors for suicide include: unemployment and loss (financial, interpersonal, professional). Protective factors for this patient include: positive social support and hope for the future. Considering these factors, the overall suicide risk at this point appears to be moderate. Patient is not appropriate for outpatient follow up.   AUDIT    Flowsheet [facility]) [facility]  Alcohol Use Disorder Identification Test Final Score (AUDIT) 0 0      PHQ2-9    Flowsheet Row ED from 04/01/2023 in [facility] Emergency Department at [facility]  PHQ-2 Total Score 1  PHQ-9 Total Score 9      Flowsheet Row ED from 04/01/2023 in [facility] Department at [facility] ED from 10/25/2022 in [facility] Emergency Department at [facility] ED from 06/03/2022 in [facility] Emergency Department at [facility]  C-SSRS RISK CATEGORY No Risk No Risk No Risk        Chief Complaint:  Chief Complaint  Patient presents with   Psychiatric Evaluation   Visit Diagnosis: F31.30 Bipolar Disorder Depressed   CCA Screening, Triage and Referral (STR)  Patient Reported Information How did you hear about [place]? Self  What Is the Reason for Your Visit/Call Today? Patient [facility] to [facility] after intentionally cutting herself in what appears to be a possible suicide attempt.  Patient states that shehad a history of cutting when she  was in her twenties, but states that she has not done any cutting in the past 20 years.  Tonight, she states that her anxiety got so high that she decided to cut herself to relieve stress, but she cut deep enought to require stitches. Patient has a history of at least two prior suicide attempts in the past and states that she has also been hospitalized on a couple occasions in the past.  Patient states, "I want to go home, I don't want to go to [facility]." Patient denies that she is currently suicidal.  She also denies HI/Psychosis.  She has a past history of cocaine use, but denies any currentuse of cocaine.  Patient tested positive for benzodiazepines which she did not admit to during the assessment.  She did report that she has no current PCP or mental health provider, so TTS is unclear how much medication she is using and where she is getting it. Patient states that she has to take medications to sleep because she has a difficult time going to sleep. She states that her appetite is good.  She denies any history of abuse.  Patient states that she was married on one occasion, but states that she has been separated for the past 18 years.  Patient states that she has two children ages 45 and 75. Patient has been in her current relationship for the past ten years. Patient states that she is currently unemployed and states that she is financially strained.  She denies any current legal issues or access to weapons.  Patient is alert and oriented. Her judgment, insight and impulse control are impaired.  Her thoughts are  organized and her memory intact.  She does not appear to be responding to any internal stimuli. Her speech is coherent and normal in tone and rate and her eye contact is good.  How Long Has This Been Causing You Problems? <Week  What Do You Feel Would Help You the Most Today? Treatment for Depression or other mood problem   Have You Recently Had Any Thoughts About Hurting Yourself?  No  Are You Planning to Commit Suicide/Harm Yourself At This time? No   Flowsheet Row ED from 04/01/2023 in [facility] Emergency Department at [facility] ED from 10/25/2022 in [facility] Department at [facility] ED from 06/03/2022 in [facility] Emergency Department at [facility]  C-SSRS RISK CATEGORY No Risk No Risk No Risk       Have you Recently Had Thoughts About Hurting Someone [name]? No  Are You Planning to Harm Someone at This Time? No  Explanation: NA   Have You Used Any Alcohol or Drugs in the Past 24 Hours? No  What Did You Use and How Much? patient denies drug use, but tested positive for Benzodiazepines   Do You Currently Have a Therapist/Psychiatrist? No  Name of Therapist/Psychiatrist: Name of Therapist/Psychiatrist: NA   Have You Been Recently Discharged From Any Office Practice or Programs? No  Explanation of Discharge From Practice/Program: NA     CCA Screening Triage Referral Assessment Type of Contact: Tele-Assessment  Telemedicine Service Delivery:   Is this Initial or Reassessment? Is this Initial or Reassessment?: Initial Assessment  Date Telepsych consult ordered in [facility]:  Date Telepsych consult ordered in [facility]: 04/01/23  Time Telepsych consult ordered in [facility]:  Time Telepsych consult ordered in [facility]: 0240  Location of Assessment: [facility] ED  Provider Location: [facility] Services   Collateral Involvement: no collateral information available   Does Patient Have a Automotive engineer Guardian? No  Legal Guardian Contact Information: NA  Copy of Legal Guardianship Form: -- (NA)  Legal Guardian Notified of Arrival: -- (NA)  Legal Guardian Notified of Pending Discharge: -- (NA)  If Minor and Not Living with Parent(s), Who has Custody? NA  Is CPS involved or ever been involved? Never  Is APS involved or ever been involved? Never   Patient Determined To Be At Risk for Harm To Self or Others  Based on Review of Patient Reported Information or Presenting Complaint? Yes, for Self-Harm  Method: Plan without intent (states that she cut herself to relieve stress)  Availability of Means: In hand or used (cut enough to require stitches)  Intent: Intends to cause physical harm but not necessarily death  Notification Required: Identifiable person is aware  Additional Information for Danger to Others Potential: Previous attempts  Additional Comments for Danger to Others Potential: NA  Are There Guns or Other Weapons in Your Home? No  Types of Guns/Weapons: NA  Are These Weapons Safely Secured?                            No  Who Could Verify You Are Able To Have These Secured: NA  Do You Have any Outstanding Charges, Pending Court Dates, Parole/Probation? none reported  Contacted To Inform of Risk of Harm To Self or Others: Other: Comment (only wanted to harm self)    Does Patient Present under Involuntary Commitment? No    [place] of Residence: [place]   Patient Currently Receiving the Following Services:  Not Receiving Services   Determination of Need: Urgent (48 hours)   Options For Referral: Inpatient Hospitalization     CCA Biopsychosocial Patient Reported Schizophrenia/Schizoaffective Diagnosis in Past: No   Strengths: none identified   Mental Health Symptoms Depression:   None   Duration of Depressive symptoms:    Mania:   None   Anxiety:    Difficulty concentrating; Restlessness; Sleep; Tension; Worrying   Psychosis:   None   Duration of Psychotic symptoms:    Trauma:   None   Obsessions:   None   Compulsions:   None   Inattention:   None   Hyperactivity/Impulsivity:   None   Oppositional/Defiant Behaviors:   None   Emotional Irregularity:   Potentially harmful impulsivity   Other Mood/Personality Symptoms:   depressed mood, moderate anxiety    Mental Status Exam Appearance and self-care  Stature:   Average    Weight:   Average weight   Clothing:   Neat/clean   Grooming:   Normal   Cosmetic use:   None   Posture/gait:   Normal   Motor activity:   Restless   Sensorium  Attention:   Normal   Concentration:   Anxiety interferes   Orientation:   Object; Person; Place; Situation; Time   Recall/memory:   Normal   Affect and Mood  Affect:   Anxious; Depressed   Mood:   Depressed; Anxious   Relating  Eye contact:   None   Facial expression:   Depressed; Anxious   Attitude toward examiner:   Cooperative   Thought and Language  Speech flow:  Clear and Coherent   Thought content:   Appropriate to Mood and Circumstances   Preoccupation:   None   Hallucinations:   None   Organization:   Goal-directed   Company secretary of Knowledge:   Average   Intelligence:   Average   Abstraction:   Normal   Judgement:   Impaired   Reality Testing:   Realistic   Insight:   Flashes of insight   Decision Making:   Impulsive   Social Functioning  Social Maturity:   Impulsive   Social Judgement:   Normal   Stress  Stressors:   Office manager Ability:   Normal   Skill Deficits:   Interpersonal   Supports:   Family     Religion: Religion/Spirituality Are You A Religious Person?: Yes What is Your Religious Affiliation?: Christian How Might This Affect Treatment?: NA  Leisure/Recreation: Leisure / Recreation Do You Have Hobbies?: No  Exercise/Diet: Exercise/Diet Do You Exercise?: No Have You Gained or Lost A Significant Amount of Weight in the Past Six Months?: No Do You Follow a Special Diet?: No Do You Have Any Trouble Sleeping?: Yes Explanation of Sleeping Difficulties: problems falling asleep   CCA Employment/Education Employment/Work Situation: Employment / Work Situation Employment Situation: Unemployed Patient's Job has Been Impacted by Current Illness: Yes Describe how Patient's Job has Been Impacted:  Patient reports an inability to work due to Bipolar illness Has Patient ever Been in Equities trader?: No  Education: Education Is Patient Currently Attending School?: No Last Grade Completed: 12 Did You Product manager?: No Did You Have An Individualized Education Program (IIEP): No Did You Have Any Difficulty At School?: No Patient's Education Has Been Impacted by Current Illness: No   CCA Family/Childhood History Family and Relationship History: Family history Marital status: Separated Separated, when?: Separated for 19 years What types of issues is  patient dealing with in the relationship?: Patient reports that her boyfriend is very supportive, they have been together for the past ten years Additional relationship information: N/A Does patient have children?: Yes How many children?: 2 How is patient's relationship with their children?: Patient has a 49 and 42 year old that she sees on the weekends, reports a "close" relationship with children  Childhood History:  Childhood History By whom was/is the patient raised?: Mother Did patient suffer any verbal/emotional/physical/sexual abuse as a child?: No Did patient suffer from severe childhood neglect?: No Has patient ever been sexually abused/assaulted/raped as an adolescent or adult?: No Was the patient ever a victim of a crime or a disaster?: No Witnessed domestic violence?: No Has patient been affected by domestic violence as an adult?: No       CCA Substance Use Alcohol/Drug Use: Alcohol / Drug Use Pain Medications: See MAR  Prescriptions: See MAR  Over the Counter: See MAR  History of alcohol / drug use?: Yes Longest period of sobriety (when/how long): None  Negative Consequences of Use: Work / Programmer, multimedia, Copywriter, advertising relationships Withdrawal Symptoms: None Substance #1 Name of Substance 1: benzodiazepines 1 - Age of First Use: unknown. patient did not admit to use of benzodiazepines 1 - Amount (size/oz): unknown. patient  did not admit to use of benzodiazepines 1 - Frequency: unknown. patient did not admit to use of benzodiazepines 1 - Duration: unknown. patient did not admit to use of benzodiazepines 1 - Last Use / Amount: unknown. patient did not admit to use of benzodiazepines 1 - Method of Aquiring: unknown. patient did not admit to use of benzodiazepines 1- Route of Use: unknown. patient did not admit to use of benzodiazepines                       ASAM's:  Six Dimensions of Multidimensional Assessment  Dimension 1:  Acute Intoxication and/or Withdrawal Potential:   Dimension 1:  Description of individual's past and current experiences of substance use and withdrawal: Patient denies any history of withdrawal symptoms  Dimension 2:  Biomedical Conditions and Complications:   Dimension 2:  Description of patient's biomedical conditions and  complications: Patient denies medical issues  Dimension 3:  Emotional, Behavioral, or Cognitive Conditions and Complications:  Dimension 3:  Description of emotional, behavioral, or cognitive conditions and complications: Patient has a diagnosis of bipolar disorder and she has not been on medications in a long time  Dimension 4:  Readiness to Change:  Dimension 4:  Description of Readiness to Change criteria: Patient has a history of cocaine use, but has not used any drugs in four years, denies any current use of drugs, but tested positive for benzodiazepines.  Dimension 5:  Relapse, Continued use, or Continued Problem Potential:  Dimension 5:  Relapse, continued use, or continued problem potential critiera description: Patient lacks coping strategies to  prevent relapse  Dimension 6:  Recovery/Living Environment:  Dimension 6:  Recovery/Iiving environment criteria description: Patient states that she lives in a satble and supportive environment  ASAM Severity Score: ASAM's Severity Rating Score: 8  ASAM Recommended Level of Treatment: ASAM Recommended Level of  Treatment: Level III Residential Treatment   Substance use Disorder (SUD) Substance Use Disorder (SUD)  Checklist Symptoms of Substance Use:  (unable to assess, patient denied any substance abuse)  Recommendations for Services/Supports/Treatments: Recommendations for Services/Supports/Treatments Recommendations For Services/Supports/Treatments: Residential-Level 3  Discharge Disposition:    DSM5 Diagnoses: Patient Active Problem List   Diagnosis  Date Noted   Bipolar I disorder, most recent episode depressed (HCC) 04/01/2023   Polysubstance (including opioids) dependence with physiol dependence (HCC) 04/03/2017   HLD (hyperlipidemia) 12/03/2014   Chest pain 12/03/2014   Substance abuse (HCC) 12/03/2014   Hypokalemia 12/03/2014   Pain in the chest    Suicidal ideations    Anxiety    Suicidal behavior    Recurrent major depression-severe (HCC) 04/08/2014   Suicidal thoughts 04/07/2014   GERD (gastroesophageal reflux disease) 03/18/2014   Vaginal discharge 03/18/2014   Tobacco abuse 03/18/2014   Anxiety state, unspecified 03/18/2014   Colitis 03/16/2014   Panic attacks 01/11/2014   GAD (generalized anxiety disorder) 01/11/2014   Severe recurrent major depression without psychotic features (HCC) 01/10/2014   Pyelonephritis 01/03/2014   Hypotension 12/31/2013   UTI (urinary tract infection) 12/31/2013   Chronic pelvic pain in female 03/15/2011     Referrals to Alternative Service(s): Referred to Alternative Service(s):   Place:   Date:   Time:    Referred to Alternative Service(s):   Place:   Date:   Time:    Referred to Alternative Service(s):   Place:   Date:   Time:    Referred to Alternative Service(s):   Place:   Date:   Time:     Vaughan Garfinkle J Makesha Belitz, LCAS

## 2023-04-02 ENCOUNTER — Encounter (HOSPITAL_COMMUNITY): Payer: Self-pay | Admitting: Nurse Practitioner

## 2023-04-02 DIAGNOSIS — Z789 Other specified health status: Secondary | ICD-10-CM | POA: Insufficient documentation

## 2023-04-02 MED ORDER — OXYCODONE-ACETAMINOPHEN 5-325 MG PO TABS
1.0000 | ORAL_TABLET | Freq: Three times a day (TID) | ORAL | Status: DC | PRN
  Administered 2023-04-02 – 2023-04-03 (×2): 1 via ORAL
  Filled 2023-04-02 (×2): qty 1

## 2023-04-02 MED ORDER — CLONIDINE HCL 0.1 MG PO TABS
0.1000 mg | ORAL_TABLET | Freq: Once | ORAL | Status: AC
  Administered 2023-04-02: 0.1 mg via ORAL
  Filled 2023-04-02 (×2): qty 1

## 2023-04-02 MED ORDER — NICOTINE 21 MG/24HR TD PT24: 21.0000 mg | MEDICATED_PATCH | Freq: Every day | TRANSDERMAL | Status: DC | PRN

## 2023-04-02 MED ORDER — HYDROXYZINE HCL 25 MG PO TABS
25.0000 mg | ORAL_TABLET | Freq: Three times a day (TID) | ORAL | Status: DC | PRN
  Administered 2023-04-02 – 2023-04-04 (×3): 25 mg via ORAL
  Filled 2023-04-02: qty 1
  Filled 2023-04-02: qty 10
  Filled 2023-04-02 (×2): qty 1

## 2023-04-02 MED ORDER — NICOTINE POLACRILEX 2 MG MT GUM: 2.0000 mg | CHEWING_GUM | OROMUCOSAL | Status: DC | PRN

## 2023-04-02 MED ORDER — CLONIDINE HCL 0.1 MG PO TABS: 0.1000 mg | ORAL_TABLET | Freq: Four times a day (QID) | ORAL | Status: DC | PRN

## 2023-04-02 MED ORDER — LOPERAMIDE HCL 2 MG PO CAPS
4.0000 mg | ORAL_CAPSULE | Freq: Four times a day (QID) | ORAL | Status: DC | PRN
  Administered 2023-04-02: 4 mg via ORAL
  Filled 2023-04-02: qty 2

## 2023-04-02 MED ORDER — BACITRACIN-NEOMYCIN-POLYMYXIN OINTMENT TUBE
1.0000 | TOPICAL_OINTMENT | Freq: Two times a day (BID) | CUTANEOUS | Status: DC
  Administered 2023-04-02 – 2023-04-05 (×4): 1 via TOPICAL
  Filled 2023-04-02 (×2): qty 14.17

## 2023-04-02 NOTE — Plan of Care (Signed)
  Problem: Education: Goal: Emotional status will improve Outcome: Progressing Goal: Mental status will improve Outcome: Progressing   Problem: Activity: Goal: Sleeping patterns will improve Outcome: Progressing   

## 2023-04-02 NOTE — BHH Counselor (Signed)
Adult Comprehensive Assessment  Patient ID: ROBIE ECCLESTON, female   DOB: 19-Feb-1976, 47 y.o.   MRN: [id number]  Information Source: Information source: Patient  Current Stressors:  Patient states their primary concerns and needs for treatment are:: Patient states that she would like to get panic attack and anxiety back to [place] Patient states their goals for this hospitilization and ongoing recovery are:: Patient states that she wants to learn coping mechanism Educational / Learning stressors: none reported Employment / Job issues: unemployed Family Relationships: none reported Surveyor, quantity / Lack of resources (include bankruptcy): Patient staes that financial and resources are cause of her anxiety Housing / Lack of housing: none reported Physical health (include injuries & life threatening diseases): none reported Social relationships: Client states that she does not have many friend to cause any stressor Substance abuse: none reported Bereavement / Loss: none reported  Living/Environment/Situation:  Living Arrangements: Spouse/significant other Living conditions (as described by patient or guardian): Client states that "its goodness enough for me and him." Who else lives in the home?: Client states that boyfriend lives in the home too How long has patient lived in current situation?: Client states that it"will be about three years." What is atmosphere in current home: Loving, Comfortable  Family History:  Marital status: Separated Separated, when?: Separated for 19 years What types of issues is patient dealing with in the relationship?: Patient reports that her boyfriend is very supportive, they have been together for the past ten years Additional relationship information: N/A Are you sexually active?: Yes What is your sexual orientation?: heterosexual Has your sexual activity been affected by drugs, alcohol, medication, or emotional stress?: none reported Does patient have  children?: Yes How many children?: 2 How is patient's relationship with their children?: Patient states that she is close to her children  Childhood History:  By whom was/is the patient raised?: Mother Additional childhood history information: Pt reports having a good childhood.  Description of patient's relationship with caregiver when they were a child: close with mother Patient's description of current relationship with people who raised him/her: Client states that mom has passed away How were you disciplined when you got in trouble as a child/adolescent?: Client states that she never discipline Does patient have siblings?: Yes Number of Siblings: 1 Description of patient's current relationship with siblings: estranged Did patient suffer any verbal/emotional/physical/sexual abuse as a child?: No Did patient suffer from severe childhood neglect?: No Has patient ever been sexually abused/assaulted/raped as an adolescent or adult?: No Was the patient ever a victim of a crime or a disaster?: No Witnessed domestic violence?: No Has patient been affected by domestic violence as an adult?: No  Education:  Highest grade of school patient has completed: high school Currently a Consulting civil engineer?: No Learning disability?: No  Employment/Work Situation:   Employment Situation: Unemployed Patient's Job has Been Impacted by Current Illness: No Has Patient ever Been in the [employer]?: No  Financial Resources:   Surveyor, quantity resources: Medicaid Does patient have a Lawyer or guardian?: No  Alcohol/Substance Abuse:   What has been your use of drugs/alcohol within the last 12 months?: none reported If attempted suicide, did drugs/alcohol play a role in this?: No Alcohol/Substance Abuse Treatment Hx: Past Tx, Inpatient, Denies past history Has alcohol/substance abuse ever caused legal problems?: No  Social Support System:   Patient's Community Support System: Good Describe Community Support  System: Paitent states that she goes to [employer] for grocery Type of faith/religion: Chirstian How does patient's faith  help to cope with current illness?: Client states that she pray  Leisure/Recreation:   Do You Have Hobbies?: Yes Leisure and Hobbies: Client states that she is a good friend and a good listener  Strengths/Needs:   What is the patient's perception of their strengths?: Patient states that she can be a good friend and a good listener Patient states they can use these personal strengths during their treatment to contribute to their recovery: Patient states that making friends here will help her and to listen and follow directions Patient states these barriers may affect/interfere with their treatment: Patient states that she is shy Patient states these barriers may affect their return to the community: Patient states that she will have a hard time going to look for resources because she is shy Other important information patient would like considered in planning for their treatment: none reported  Discharge Plan:   Currently receiving community mental health services: No Patient states concerns and preferences for aftercare planning are: Patient states that her concerns  would be not following up with aftercare Patient states they will know when they are safe and ready for discharge when: Patient states that she feel safe for discharge NOW Does patient have access to transportation?: No (patient states that she needs a taxi voucher to go home) Does patient have financial barriers related to discharge medications?: No Patient description of barriers related to discharge medications: none reported Plan for no access to transportation at discharge: Client states that she would like a taxi voucher Will patient be returning to same living situation after discharge?: Yes  Summary/Recommendations:   Summary and Recommendations (to be completed by the evaluator): DAILEEN RINES  is an 47 y.o. female. Patient is admitted to [facility] from [facility] for SI. Patient denies HI and AVH. Patient reports living with boyfriend. Patient has a daughter and a son but does not live with her. Patient states that she cut herself to relieve the anxiety level. Patient has a history of panic attack, anxiety, and history of hospitalization for SI, and "I have not experienced this level of anxiety for 19 years." Patient states that financial plays a role in her anxiety level. Patient states that transportation can be a strain for her to getting home. Patient will benefit from crisis stabilization, medication evaluation, group therapy and psychoeducation, in addition to case management for discharge planning. At discharge it is recommended that Patient adhere to the established discharge plan and continue in treatment.  Kamali Sakata O Glenden Rossell. 04/02/2023

## 2023-04-02 NOTE — BHH Group Notes (Signed)
B[facility] Group Notes:  (Nursing/MHT/Case Management/Adjunct)  Date:  04/02/2023  Time:  2000  Type of Therapy:   Wrap up group  Participation Level:  Active  Participation Quality:  Appropriate, Attentive, Sharing, and Supportive  Affect:  Depressed  Cognitive:  Alert  Insight:  Improving  Engagement in Group:  Engaged  Modes of Intervention:  Clarification, Education, and Support  Summary of Progress/Problems: Positive thinking and positive change were discussed.   Marcille Buffy 04/02/2023, 8:56 PM

## 2023-04-02 NOTE — Progress Notes (Signed)
   04/02/23 0000  Psych Admission Type (Psych Patients Only)  Admission Status Involuntary  Psychosocial Assessment  Patient Complaints Anxiety  Eye Contact Fair  Facial Expression Anxious  Affect Anxious  Speech Logical/coherent  Interaction Assertive  Motor Activity Restless  Appearance/Hygiene Disheveled  Behavior Characteristics Anxious  Mood Anxious  Aggressive Behavior  Effect Self-harm  Thought Process  Coherency WDL  Content WDL  Delusions WDL  Perception WDL  Hallucination None reported or observed  Judgment WDL  Confusion WDL  Danger to Self  Current suicidal ideation? Denies  Danger to Others  Danger to Others None reported or observed

## 2023-04-02 NOTE — BHH Suicide Risk Assessment (Signed)
[facility] Admission Suicide Risk Assessment  Nursing information obtained from:  Patient Demographic factors:  Caucasian, Low socioeconomic status Current Mental Status:  Suicidal ideation indicated by patient Loss Factors:  Financial problems / change in socioeconomic status Historical Factors:  Impulsivity Risk Reduction Factors:  Positive social support, Living with another person, especially a relative  Total Time spent with patient: 45 minutes Principal Problem: Major depressive disorder, recurrent episode, severe (HCC) Diagnosis:  Principal Problem:   Major depressive disorder, recurrent episode, severe (HCC) Active Problems:   Generalized anxiety disorder   Opioid dependence (HCC)   Alcohol use  Subjective Data:  " It built up and it just came out. "   [name] is a 47 y.o. female  with a past psychiatric history of bipolar disorder, major depressive disorder, sedative dependence, panic disorder with agoraphobia. Patient initially arrived to [facility] on 04/01/23 for 2 self-inflected lacerations, and admitted to [facility] under IVC on 04/02/23 for acute suicidal or self-harming behaviors. PPHx is significant for bipolar disorder, and history of Suicide Attempts (x2), Self Injurious Behavior (cutting, none in past 20 years), and multiple prior psychiatric hospitalizations [facility] 2008x2, 2009x2, 2010, 2011, 2012, 2015, 2016) . PMHx is significant for colitis, GERD, chronic bronchitis, hyperlipidemia.        Current Outpatient Medications  Medication Instructions   albuterol (PROVENTIL HFA;VENTOLIN HFA) 108 (90 Base) MCG/ACT inhaler 2 puffs, Inhalation, Every 6 hours PRN   ibuprofen (ADVIL) 400-600 mg, Oral, Every 6 hours PRN   omeprazole (PRILOSEC) 40 mg, Oral, Daily   oxyCODONE-acetaminophen (PERCOCET) 10-325 MG tablet 1 tablet, Oral, Every 6 hours PRN   sertraline (ZOLOFT) 100 mg, Oral, Daily    PRN medication prior to evaluation: none   According to outside records, the patient presented  to the ED for evaluation of 2 self-inflected lacerations by Swiss army knife. Per TTS evaluation, Patient states that shehad a history of cutting when she was in her twenties, but states that she has not done any cutting in the past 20 years. Tonight, she states that her anxiety got so high that she decided to cut herself to relieve stress, but she cut deep enought to require stitches. Patient has a history of at least two prior suicide attempts in the past and states that she has also been hospitalized on a couple occasions in the past. Patient states, "I want to go home, I don't want to go to [facility]." Patient denies that she is currently suicidal. She also denies HI/Psychosis. She has a past history of cocaine use, but denies any currentuse of cocaine. Patient tested positive for benzodiazepines which she did not admit to during the assessment. She did report that she has no current PCP or mental health provider, so TTS is unclear how much medication she is using and where she is getting it. Patient states that she has to take medications to sleep because she has a difficult time going to sleep. She states that her appetite is good. She denies any history of abuse.    Chart review:  MAR was reviewed and patient was compliant with medications. Patient received no PRNs. CIWA 14, 13, 12 (tremor, sweats, anxiety -6, mild HA)   On admission to [facility], [name] is a 47 year old female involuntarily admitted to [facility] on 04/01/23 following a suicide attempt by cutting her left wrist with a swiss army knife. Patient report that she felt very anxious and had a panic attack prior to cutting herself. Patient reports that  her anxiety got so high that she cut herself to relieve her stress. Patient received stitches in the ED for her laceration.    Patient has had 2 prior suicide attempts in the past. Patient reports that her stressors are mainly related to financial and occupational issues. Patient reports that  she lives in an apartment with her boyfriend of 10 years. Patient denied drug use however her UDS is positive for benzodiazepines. Pt reports that she smokes 1 pack of cigarettes per day. Patient reports that she drinks 1x per week and usually has about 3-4 beers. Patient's blood alcohol level was 247 upon admission to ED.    Patient visibly detoxing from alcohol upon admission to [facility]. Patient presents with elevated BP; 170/108, sweating, tremors, headache, and high anxiety. Patient's CIWA score = 14 upon admission, 2mg  of Ativan administered per MAR. Pt denies SI/HI/AVH upon admission.   Labs notable for: Na 132, Ethanol 247, CBC wnl, UDS+BZD She was given zoloft 100mg  and percocet and oxy prior to discharge.   HPI:  Patient is seen in assessment room. She is calm and cooperative. She minimizes her substance use and the events leading up to the hospitalization. Patient reports this hasn't happened in many years. Reports it's been building up for a few days. Reports had a panic attack. Reports didn't feel like doing certain things. "It's been a long time since I've been on any medication for anxiety. For the past 3-4 days couldn't quit shaking. It built up and it just came out."    When asked what stressors may have contributed to this incident she reports the only thing she can think of is that she had a doctor's appointment and have to take the bus. That day, didn't want to get out and get on the bus. Reports after going to the doctor's, still felt hot in the face and shaking.    Denies anhedonia. Denies suicidal thoughts. Reports 2-3/10 depression. Reports was future-looking. Reports sleep not great. Reports have trazodone at home, on 300mg . Reports have recently lost weight, appetite not great.    Reports when she cut herself "the bipolar just came out really bad." Reports in the past when she had bad anxiety and panic attacks, cutting helped relieve the pressure. She reports she didn't mean to cut  herself as deep as she did. She reports feeling scared. She reports feeling more anxiety.    When asked about her substance use, she reports she possibly had a can of beer prior. Just watching a movie. Reports drinking a can of beer once every 2 weeks. Denies history of seizures or delirium tremens.    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    Psychiatric ROS Mood Symptoms Denies changes in concentration. Denies moving slower or faster than normal. Denies suicidal ideation. Denies anhedonia. Reports poor sleep and appetite.    Manic Symptoms Reports last "manic episode" was a month ago, cleaning everything that needed to be cleaned. Reports sleeping 3-4 hours, would last maybe 3 days. Denies distractibility, grandiosity, impulsive behaviors.    Anxiety Symptoms Reports generalized anxiety. Reports panic attacks 2-3 times a week. Reports shaking, sweating, last for a couple of hours. Tries coping strategies. Feeling generalized anxiety. Reports when anxious face will get hot.     Trauma Symptoms She denies history of physical, emotional or sexual trauma.    Psychosis Symptoms She denies thought insertion, thought withdrawal, thought broadcasting. She denies AVH. She does not appear  to be responding to internal or external stimuli.    Collateral information: [name] boyfriend: [phone] Reports she was fine, they were watching movies. Then, when he came out of the bathroom, she was bleeding. This is the first time it happened. Reports over the last couple of days, her mood has been normal. Reports this came out of the blue. This hadn't happened before. Denies that she seemed to have thoughts of harming self. Reports it was out of the blue and she's been fine. Reports they were talking, laughing.    Past Psychiatric Hx: Current Psychiatrist: None Current Therapist: None Previous Psychiatric Diagnoses: Bipolar disorder, GAD, Borderline Personality  Disorder, MDD  Current psychiatric medications: percocet, trazodone Psychiatric medication history/compliance: Reports quit taking everything all at once. History of taking Zoloft, Xanax, Seroquel, Ambien, lamictal, propranolol. Risperidone - made back of throat clog up.  Psychiatric Hospitalization hx: [facility] 2008x2, 2009x2, 2010, 2011, 2012, 2015, 2016) Psychotherapy hx: none Neuromodulation history: none  History of suicide (obtained from HPI): Twice in the past per chart review (patient denied).  History of homicide or aggression (obtained in HPI): Denies    Substance Abuse Hx: (Frequency, quantity, last use, impact) Alcohol: Reports drank more than a beer.  Tobacco: Reports smoking cigarettes,  Cannabis: Denies Other Illicit drugs: Reports used to use Klonopin, ativan at home, reports last used a year or 2. Reports took one of cousin's xanax prior to admission.  Rx drug abuse: Denies  Rehab hx: Denies    Past Medical History: PCP: Denies, reports seeing a pain management doctor at [facility] - Dr. Kirtland Bouchard  Medical Dx: GERD Medications: pain medication for back and neck (oxy 10 TID), GERD (omeprazole 40), sleeping pills (trazodone) Allergies: abilify - reports some muscle spasms, risperdal - reports itch back of throat, couldn't swallow  Hospitalizations: colitis (02/2014), hypotension (12/2013) Surgeries: cholecystectomy, metal rod in R wrist Trauma: Denies  Seizures: Denies    LMP: 8/30 Contraceptives: Denies   Family Medical History:      Family History  Problem Relation Age of Onset   Heart attack Mother     Kidney Stones Mother     Heart attack Father     Ovarian cancer Maternal Grandmother            Family Psychiatric History: Psychiatric Dx: Mom - panic attacks. Aunt - depression, anxiety.  Suicide Hx: Denies Violence/Aggression: Denies  Substance use: Denies    Social History: Living Situation: Reports living with boyfriend Education: Completed high  school Occupational hx: unemployed Marital Status: Separated  Children: 2 (18, 48 y/o)  Legal: denies  Hotel manager: denies    Access to firearms: denies   Continued Clinical Symptoms:  Alcohol Use Disorder Identification Test Final Score (AUDIT): 11 The "Alcohol Use Disorders Identification Test", Guidelines for Use in Primary Care, Second Edition.  World Science writer [facility]). Score between 0-7:  no or low risk or alcohol related problems. Score between 8-15:  moderate risk of alcohol related problems. Score between 16-19:  high risk of alcohol related problems. Score 20 or above:  warrants further diagnostic evaluation for alcohol dependence and treatment.   CLINICAL FACTORS:   Depression:   Impulsivity Alcohol/Substance Abuse/Dependencies Previous Psychiatric Diagnoses and Treatments  Musculoskeletal: Strength & Muscle Tone: within normal limits Gait & Station: normal Patient leans: N/A  Psychiatric Specialty Exam  Presentation  General Appearance: Appropriate for Environment  Eye Contact:Minimal  Speech:Clear and Coherent  Speech Volume:Normal  Handedness:-- (not assessed)   Mood and Affect  Mood:Anxious  Affect:Flat   Thought Process  Thought Processes:Coherent  Descriptions of Associations:Intact  Orientation:Full (Time, Place and Person)  Thought Content:WDL  History of Schizophrenia/Schizoaffective disorder:No  Duration of Psychotic Symptoms:N/A Hallucinations:Hallucinations: None  Ideas of Reference:None  Suicidal Thoughts:Suicidal Thoughts: No  Homicidal Thoughts:Homicidal Thoughts: No   Sensorium  Memory:Immediate Fair; Recent Fair  Judgment:Poor  Insight:Poor   Executive Functions  Concentration:Fair  Attention Span:Fair  Recall:Fair  Fund of Knowledge:Fair  Language:Fair   Psychomotor Activity  Psychomotor Activity:Psychomotor Activity: Normal   Assets  Assets:Communication Skills; Desire for Improvement;  Housing; Intimacy; Social Support; Physical Health; Resilience   Sleep  Sleep:Sleep: Fair Number of Hours of Sleep: 8.5    Physical Exam ROS  Physical Exam Constitutional:      Appearance: the patient is not toxic-appearing.  Pulmonary:     Effort: Pulmonary effort is normal.  Neurological:     General: No focal deficit present.     Mental Status: the patient is alert and oriented to person, place, and time.  MSK: L forearm laceration with bandage over   Review of Systems  Respiratory:  Negative for shortness of breath.   Cardiovascular:  Negative for chest pain.  Gastrointestinal:  Negative for abdominal pain, constipation, diarrhea, nausea and vomiting.  Neurological:  Negative for headaches.   Blood pressure (!) 149/108, pulse (!) 105, temperature 98.3 F (36.8 C), temperature source Oral, resp. rate 16, height 5\' 3"  (1.6 m), weight 76.2 kg, SpO2 98%. Body mass index is 29.76 kg/m.   COGNITIVE FEATURES THAT CONTRIBUTE TO RISK:  Thought constriction (tunnel vision)    SUICIDE RISK:   Moderate:  Frequent suicidal ideation with limited intensity, and duration, some specificity in terms of plans, no associated intent, good self-control, limited dysphoria/symptomatology, some risk factors present, and identifiable protective factors, including available and accessible social support.  PLAN OF CARE: See H&P  ASSESSMENT: See H&P  I certify that inpatient services furnished can reasonably be expected to improve the patient's condition.   Karie Fetch, MD, PGY-2 04/02/2023, 3:53 PM

## 2023-04-02 NOTE — Plan of Care (Signed)
  Problem: Education: Goal: Verbalization of understanding the information provided will improve Outcome: Progressing   Problem: Health Behavior/Discharge Planning: Goal: Compliance with treatment plan for underlying cause of condition will improve Outcome: Progressing   Problem: Safety: Goal: Periods of time without injury will increase Outcome: Progressing

## 2023-04-02 NOTE — Progress Notes (Signed)
   04/02/23 2136  Psych Admission Type (Psych Patients Only)  Admission Status Involuntary  Psychosocial Assessment  Patient Complaints Anxiety  Eye Contact Fair  Facial Expression Anxious  Affect Appropriate to circumstance  Speech Logical/coherent  Interaction Assertive  Motor Activity Slow  Appearance/Hygiene Disheveled  Behavior Characteristics Appropriate to situation  Mood Anxious;Pleasant  Thought Process  Coherency WDL  Content WDL  Delusions None reported or observed  Perception WDL  Hallucination None reported or observed  Judgment Poor  Confusion None  Danger to Self  Current suicidal ideation? Denies  Danger to Others  Danger to Others None reported or observed

## 2023-04-02 NOTE — H&P (Signed)
Psychiatric Admission Assessment Adult  Patient Identification: [name] MRN:  [id number] Date of Evaluation:  04/02/2023 Chief Complaint:  Bipolar 1 disorder, depressed (HCC) [F31.9] Principal Diagnosis: Major depressive disorder, recurrent episode, severe (HCC) Diagnosis:  Principal Problem:   Major depressive disorder, recurrent episode, severe (HCC) Active Problems:   Generalized anxiety disorder   Opioid dependence (HCC)   Alcohol use  CC: " It built up and it just came out. "  [name] is a 47 y.o. female  with a past psychiatric history of bipolar disorder, major depressive disorder, sedative dependence, panic disorder with agoraphobia. Patient initially arrived to [facility] on 04/01/23 for 2 self-inflected lacerations, and admitted to [facility] under IVC on 04/02/23 for acute suicidal or self-harming behaviors. PPHx is significant for bipolar disorder, and history of Suicide Attempts (x2), Self Injurious Behavior (cutting, none in past 20 years), and multiple prior psychiatric hospitalizations [facility] 2008x2, 2009x2, 2010, 2011, 2012, 2015, 2016) . PMHx is significant for colitis, GERD, chronic bronchitis, hyperlipidemia.   Current Outpatient Medications  Medication Instructions   albuterol (PROVENTIL HFA;VENTOLIN HFA) 108 (90 Base) MCG/ACT inhaler 2 puffs, Inhalation, Every 6 hours PRN   ibuprofen (ADVIL) 400-600 mg, Oral, Every 6 hours PRN   omeprazole (PRILOSEC) 40 mg, Oral, Daily   oxyCODONE-acetaminophen (PERCOCET) 10-325 MG tablet 1 tablet, Oral, Every 6 hours PRN   sertraline (ZOLOFT) 100 mg, Oral, Daily    PRN medication prior to evaluation: none  According to outside records, the patient presented to the ED for evaluation of 2 self-inflected lacerations by Swiss army knife. Per TTS evaluation, Patient states that shehad a history of cutting when she was in her twenties, but states that she has not done any cutting in the past 20 years. Tonight, she states that her anxiety got  so high that she decided to cut herself to relieve stress, but she cut deep enought to require stitches. Patient has a history of at least two prior suicide attempts in the past and states that she has also been hospitalized on a couple occasions in the past. Patient states, "I want to go home, I don't want to go to [facility]." Patient denies that she is currently suicidal. She also denies HI/Psychosis. She has a past history of cocaine use, but denies any currentuse of cocaine. Patient tested positive for benzodiazepines which she did not admit to during the assessment. She did report that she has no current PCP or mental health provider, so TTS is unclear how much medication she is using and where she is getting it. Patient states that she has to take medications to sleep because she has a difficult time going to sleep. She states that her appetite is good. She denies any history of abuse.   Chart review:  MAR was reviewed and patient was compliant with medications. Patient received no PRNs. CIWA 14, 13, 12 (tremor, sweats, anxiety -6, mild HA)  On admission to [facility], [name] is a 47 year old female involuntarily admitted to [facility] on 04/01/23 following a suicide attempt by cutting her left wrist with a swiss army knife. Patient report that she felt very anxious and had a panic attack prior to cutting herself. Patient reports that her anxiety got so high that she cut herself to relieve her stress. Patient received stitches in the ED for her laceration.    Patient has had 2 prior suicide attempts in the past. Patient reports that her stressors are mainly related to financial and occupational issues.  Patient reports that she lives in an apartment with her boyfriend of 10 years. Patient denied drug use however her UDS is positive for benzodiazepines. Pt reports that she smokes 1 pack of cigarettes per day. Patient reports that she drinks 1x per week and usually has about 3-4 beers. Patient's blood  alcohol level was 247 upon admission to ED.    Patient visibly detoxing from alcohol upon admission to [facility]. Patient presents with elevated BP; 170/108, sweating, tremors, headache, and high anxiety. Patient's CIWA score = 14 upon admission, 2mg  of Ativan administered per MAR. Pt denies SI/HI/AVH upon admission.  Labs notable for: Na 132, Ethanol 247, CBC wnl, UDS+BZD She was given zoloft 100mg  and percocet and oxy prior to discharge.  HPI:  Patient is seen in assessment room. She is calm and cooperative. She minimizes her substance use and the events leading up to the hospitalization. Patient reports this hasn't happened in many years. Reports it's been building up for a few days. Reports had a panic attack. Reports didn't feel like doing certain things. "It's been a long time since I've been on any medication for anxiety. For the past 3-4 days couldn't quit shaking. It built up and it just came out."   When asked what stressors may have contributed to this incident she reports the only thing she can think of is that she had a doctor's appointment and have to take the bus. That day, didn't want to get out and get on the bus. Reports after going to the doctor's, still felt hot in the face and shaking.   Denies anhedonia. Denies suicidal thoughts. Reports 2-3/10 depression. Reports was future-looking. Reports sleep not great. Reports have trazodone at home, on 300mg . Reports have recently lost weight, appetite not great.   Reports when she cut herself "the bipolar just came out really bad." Reports in the past when she had bad anxiety and panic attacks, cutting helped relieve the pressure. She reports she didn't mean to cut herself as deep as she did. She reports feeling scared. She reports feeling more anxiety.   When asked about her substance use, she reports she possibly had a can of beer prior. Just watching a movie. Reports drinking a can of beer once every 2 weeks. Denies history of seizures or  delirium tremens.   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   Psychiatric ROS Mood Symptoms Denies changes in concentration. Denies moving slower or faster than normal. Denies suicidal ideation. Denies anhedonia. Reports poor sleep and appetite.   Manic Symptoms Reports last "manic episode" was a month ago, cleaning everything that needed to be cleaned. Reports sleeping 3-4 hours, would last maybe 3 days. Denies distractibility, grandiosity, impulsive behaviors.   Anxiety Symptoms Reports generalized anxiety. Reports panic attacks 2-3 times a week. Reports shaking, sweating, last for a couple of hours. Tries coping strategies. Feeling generalized anxiety. Reports when anxious face will get hot.    Trauma Symptoms She denies history of physical, emotional or sexual trauma.   Psychosis Symptoms She denies thought insertion, thought withdrawal, thought broadcasting. She denies AVH. She does not appear to be responding to internal or external stimuli.    Collateral information: [name] boyfriend: [phone] Reports she was fine, they were watching movies. Then, when he came out of the bathroom, she was bleeding. This is the first time it happened. Reports over the last couple of days, her mood has been normal. Reports this came out of the  blue. This hadn't happened before. Denies that she seemed to have thoughts of harming self. Reports it was out of the blue and she's been fine. Reports they were talking, laughing.   Past Psychiatric Hx: Current Psychiatrist: None Current Therapist: None Previous Psychiatric Diagnoses: Bipolar disorder, GAD, Borderline Personality Disorder, MDD  Current psychiatric medications: percocet, trazodone Psychiatric medication history/compliance: Reports quit taking everything all at once. History of taking Zoloft, Xanax, Seroquel, Ambien, lamictal, propranolol. Risperidone - made back of throat clog up.  Psychiatric  Hospitalization hx: [facility] 2008x2, 2009x2, 2010, 2011, 2012, 2015, 2016) Psychotherapy hx: none Neuromodulation history: none  History of suicide (obtained from HPI): Twice in the past per chart review (patient denied).  History of homicide or aggression (obtained in HPI): Denies   Substance Abuse Hx: (Frequency, quantity, last use, impact) Alcohol: Reports drank more than a beer.  Tobacco: Reports smoking cigarettes,  Cannabis: Denies Other Illicit drugs: Reports used to use Klonopin, ativan at home, reports last used a year or 2. Reports took one of cousin's xanax prior to admission.  Rx drug abuse: Denies  Rehab hx: Denies   Past Medical History: PCP: Denies, reports seeing a pain management doctor at [facility] - Dr. Kirtland Bouchard  Medical Dx: GERD Medications: pain medication for back and neck (oxy 10 TID), GERD (omeprazole 40), sleeping pills (trazodone) Allergies: abilify - reports some muscle spasms, risperdal - reports itch back of throat, couldn't swallow  Hospitalizations: colitis (02/2014), hypotension (12/2013) Surgeries: cholecystectomy, metal rod in R wrist Trauma: Denies  Seizures: Denies   LMP: 8/30 Contraceptives: Denies  Family Medical History: Family History  Problem Relation Age of Onset   Heart attack Mother    Kidney Stones Mother    Heart attack Father    Ovarian cancer Maternal Grandmother     Family Psychiatric History: Psychiatric Dx: Mom - panic attacks. Aunt - depression, anxiety.  Suicide Hx: Denies Violence/Aggression: Denies  Substance use: Denies   Social History: Living Situation: Reports living with boyfriend Education: Completed high school Occupational hx: unemployed Marital Status: Separated  Children: 2 (18, 53 y/o)  Legal: denies  Hotel manager: denies   Access to firearms: denies    Total Time spent with patient: 45 minutes  Is the patient at risk to self? Yes.    Has the patient been a risk to self in the past 6 months? Yes.     Has the patient been a risk to self within the distant past? Yes.    Is the patient a risk to others? No.  Has the patient been a risk to others in the past 6 months? No.  Has the patient been a risk to others within the distant past? No.   [place] Scale:  Flowsheet Row [facility] ([facility] Most recent reading at 04/01/2023  6:09 PM ED from 04/01/2023 in [facility] Emergency Department at [facility] Most recent reading at 04/01/2023  1:33 AM ED from 10/25/2022 in [facility] Emergency Department at [facility] Most recent reading at 10/26/2022  2:37 AM  C-SSRS RISK CATEGORY High Risk No Risk No Risk        Tobacco Screening:  Social History   Tobacco Use  Smoking Status Every Day   Current packs/day: 1.00   Average packs/day: 1 pack/day for 10.0 years (10.0 ttl pk-yrs)   Types: Cigarettes  Smokeless Tobacco Never    BH Tobacco Counseling     Are you interested  in Tobacco Cessation Medications?  No, patient refused Counseled patient on smoking cessation:  Refused/Declined practical counseling Reason Tobacco Screening Not Completed: Patient Refused Screening       Social History:  Social History   Substance and Sexual Activity  Alcohol Use No     Social History   Substance and Sexual Activity  Drug Use Yes   Types: Cocaine   Comment: denies 05/07/2015    Additional Social History: Marital status: Separated Separated, when?: Separated for 19 years What types of issues is patient dealing with in the relationship?: Patient reports that her boyfriend is very supportive, they have been together for the past ten years Additional relationship information: N/A Are you sexually active?: Yes What is your sexual orientation?: heterosexual Has your sexual activity been affected by drugs, alcohol, medication, or emotional stress?: none reported Does patient have children?: Yes How many children?:  2 How is patient's relationship with their children?: Patient states that she is close to her children                         Allergies:   Allergies  Allergen Reactions   Aripiprazole Other (See Comments)    Causes seizures   Risperidone And Related Anaphylaxis   Tramadol Anaphylaxis   Lamictal [Lamotrigine] Other (See Comments)    Blurry vision    Lab Results:  Results for orders placed or performed during the hospital encounter of 04/01/23 (from the past 48 hour(s))  Comprehensive metabolic panel     Status: Abnormal   Collection Time: 04/01/23  1:33 AM  Result Value Ref Range   Sodium 132 (L) 135 - 145 mmol/L   Potassium 3.6 3.5 - 5.1 mmol/L   Chloride 99 98 - 111 mmol/L   CO2 17 (L) 22 - 32 mmol/L   Glucose, Bld 94 70 - 99 mg/dL    Comment: Glucose reference range applies only to samples taken after fasting for at least 8 hours.   BUN <5 (L) 6 - 20 mg/dL   Creatinine, Ser 6.96 0.44 - 1.00 mg/dL   Calcium 8.4 (L) 8.9 - 10.3 mg/dL   Total Protein 9.2 (H) 6.5 - 8.1 g/dL   Albumin 3.7 3.5 - 5.0 g/dL   AST 72 (H) 15 - 41 U/L   ALT 37 0 - 44 U/L   Alkaline Phosphatase 59 38 - 126 U/L   Total Bilirubin 0.5 0.3 - 1.2 mg/dL   GFR, Estimated >29 >52 mL/min    Comment: (NOTE) Calculated using the CKD-EPI Creatinine Equation (2021)    Anion gap 16 (H) 5 - 15    Comment: Performed at [facility], [address] [place], [place] [address]  Ethanol     Status: Abnormal   Collection Time: 04/01/23  1:33 AM  Result Value Ref Range   Alcohol, Ethyl (B) 247 (H) <10 mg/dL    Comment: (NOTE) Lowest detectable limit for serum alcohol is 10 mg/dL.  For medical purposes only. Performed at [facility], [address], [place], [place] [address]   CBC with Diff     Status: Abnormal   Collection Time: 04/01/23  1:33 AM  Result Value Ref Range   WBC 8.2 4.0 - 10.5 K/uL   RBC 4.82 3.87 - 5.11 MIL/uL   Hemoglobin 14.3 12.0 - 15.0 g/dL    HCT 27.2 53.6 - 64.4 %   MCV 88.6 80.0 - 100.0 fL   MCH 29.7 26.0 -  34.0 pg   MCHC 33.5 30.0 - 36.0 g/dL   RDW 16.1 (H) 09.6 - 04.5 %   Platelets 355 150 - 400 K/uL   nRBC 0.0 0.0 - 0.2 %   Neutrophils Relative % 31 %   Neutro Abs 2.5 1.7 - 7.7 K/uL   Lymphocytes Relative 52 %   Lymphs Abs 4.3 (H) 0.7 - 4.0 K/uL   Monocytes Relative 12 %   Monocytes Absolute 1.0 0.1 - 1.0 K/uL   Eosinophils Relative 3 %   Eosinophils Absolute 0.2 0.0 - 0.5 K/uL   Basophils Relative 2 %   Basophils Absolute 0.1 0.0 - 0.1 K/uL   Immature Granulocytes 0 %   Abs Immature Granulocytes 0.02 0.00 - 0.07 K/uL    Comment: Performed at [facility], [address] [place], [place] [address]  hCG, quantitative, pregnancy     Status: None   Collection Time: 04/01/23  1:33 AM  Result Value Ref Range   hCG, Beta Chain, Quant, S 1 <5 mIU/mL    Comment:          GEST. AGE      CONC.  (mIU/mL)   <=1 WEEK        5 - 50     2 WEEKS       50 - 500     3 WEEKS       100 - 10,000     4 WEEKS     1,000 - 30,000     5 WEEKS     3,500 - 115,000   6-8 WEEKS     12,000 - 270,000    12 WEEKS     15,000 - 220,000        FEMALE AND NON-PREGNANT FEMALE:     LESS THAN 5 mIU/mL Performed at [facility], [address] [place], [place] [address]   Urine rapid drug screen (hosp performed)     Status: Abnormal   Collection Time: 04/01/23  2:55 AM  Result Value Ref Range   Opiates NONE DETECTED NONE DETECTED   Cocaine NONE DETECTED NONE DETECTED   Benzodiazepines POSITIVE (A) NONE DETECTED   Amphetamines NONE DETECTED NONE DETECTED   Tetrahydrocannabinol NONE DETECTED NONE DETECTED   Barbiturates NONE DETECTED NONE DETECTED    Comment: (NOTE) DRUG SCREEN FOR MEDICAL PURPOSES ONLY.  IF CONFIRMATION IS NEEDED FOR ANY PURPOSE, NOTIFY LAB WITHIN 5 DAYS.  LOWEST DETECTABLE LIMITS FOR URINE DRUG SCREEN Drug Class                     Cutoff (ng/mL) Amphetamine and metabolites     1000 Barbiturate and metabolites    200 Benzodiazepine                 200 Opiates and metabolites        300 Cocaine and metabolites        300 THC                            50 Performed at [facility], [address] [place], [place] [address]     Blood Alcohol level:  Lab Results  Component Value Date   ETH 247 (H) 04/01/2023   ETH 134 (H) 04/02/2017    Metabolic Disorder Labs:  Lab Results  Component Value Date   HGBA1C 5.3 12/03/2014   MPG 105 12/03/2014   MPG  105 01/03/2014   No results found for: "PROLACTIN" Lab Results  Component Value Date   CHOL 124 12/03/2014   TRIG 77 12/03/2014   HDL 27 (L) 12/03/2014   CHOLHDL 4.6 12/03/2014   VLDL 15 12/03/2014   LDLCALC 82 12/03/2014    Current Medications: Current Facility-Administered Medications  Medication Dose Route Frequency Provider Last Rate Last Admin   acetaminophen (TYLENOL) tablet 650 mg  650 mg Oral Q6H PRN Onuoha, Josephine C, NP       albuterol (PROVENTIL) (2.5 MG/3ML) 0.083% nebulizer solution 2.5 mg  2.5 mg Nebulization Q6H PRN Massengill, Harrold Donath, MD       alum & mag hydroxide-simeth (MAALOX/MYLANTA) 200-200-20 MG/5ML suspension 30 mL  30 mL Oral Q4H PRN Welford Roche, Josephine C, NP       diphenhydrAMINE (BENADRYL) capsule 50 mg  50 mg Oral TID PRN Dahlia Byes C, NP       Or   diphenhydrAMINE (BENADRYL) injection 50 mg  50 mg Intramuscular TID PRN Dahlia Byes C, NP       folic acid (FOLVITE) tablet 1 mg  1 mg Oral Daily Onuoha, Josephine C, NP   1 mg at 04/02/23 0805   haloperidol (HALDOL) tablet 5 mg  5 mg Oral TID PRN Dahlia Byes C, NP       Or   haloperidol lactate (HALDOL) injection 5 mg  5 mg Intramuscular TID PRN Dahlia Byes C, NP       loperamide (IMODIUM) capsule 4 mg  4 mg Oral Q6H PRN Karie Fetch, MD   4 mg at 04/02/23 1449   LORazepam (ATIVAN) tablet 0-4 mg  0-4 mg Oral Q6H Onuoha, Josephine C, NP   2 mg at 04/02/23 1257   Followed by   Melene Muller  ON 04/03/2023] LORazepam (ATIVAN) tablet 0-4 mg  0-4 mg Oral Q12H Onuoha, Josephine C, NP       LORazepam (ATIVAN) tablet 1-4 mg  1-4 mg Oral Q1H PRN Dahlia Byes C, NP       Or   LORazepam (ATIVAN) tablet 1 mg  1 mg Oral Q6H PRN Dahlia Byes C, NP       multivitamin with minerals tablet 1 tablet  1 tablet Oral Daily Dahlia Byes C, NP   1 tablet at 04/02/23 0805   neomycin-bacitracin-polymyxin (NEOSPORIN) ointment 1 Application  1 Application Topical BID Otho Bellows, RPH   1 Application at 04/02/23 3016   nicotine polacrilex (NICORETTE) gum 2 mg  2 mg Oral Q4H PRN Karie Fetch, MD       oxyCODONE-acetaminophen (PERCOCET/ROXICET) 5-325 MG per tablet 1 tablet  1 tablet Oral Q6H PRN Karie Fetch, MD   1 tablet at 04/02/23 1440   pantoprazole (PROTONIX) EC tablet 40 mg  40 mg Oral Daily Dahlia Byes C, NP   40 mg at 04/02/23 0805   sertraline (ZOLOFT) tablet 100 mg  100 mg Oral Daily Onuoha, Josephine C, NP   100 mg at 04/02/23 0805   thiamine (Vitamin B-1) tablet 100 mg  100 mg Oral Daily Onuoha, Josephine C, NP   100 mg at 04/02/23 0109   Or   thiamine (VITAMIN B1) injection 100 mg  100 mg Intravenous Daily Onuoha, Josephine C, NP       traZODone (DESYREL) tablet 50 mg  50 mg Oral QHS PRN Bobbitt, Shalon E, NP       PTA Medications: Medications Prior to Admission  Medication Sig Dispense Refill Last Dose   albuterol (PROVENTIL HFA;VENTOLIN HFA)  108 (90 Base) MCG/ACT inhaler Inhale 2 puffs into the lungs every 6 (six) hours as needed for wheezing or shortness of breath.      ibuprofen (ADVIL,MOTRIN) 200 MG tablet Take 400-600 mg by mouth every 6 (six) hours as needed for moderate pain.       omeprazole (PRILOSEC) 40 MG capsule Take 1 capsule (40 mg total) by mouth daily. 30 capsule 3    oxyCODONE-acetaminophen (PERCOCET) 10-325 MG tablet Take 1 tablet by mouth every 6 (six) hours as needed for pain.      sertraline (ZOLOFT) 100 MG tablet Take 1 tablet (100 mg total)  by mouth daily. (Patient not taking: Reported on 03/31/2019) 10 tablet 0     Musculoskeletal: Strength & Muscle Tone: within normal limits Gait & Station: normal Patient leans: N/A  Psychiatric Specialty Exam:  Presentation  General Appearance: Appropriate for Environment  Eye Contact:Minimal  Speech:Clear and Coherent  Speech Volume:Normal  Handedness:-- (not assessed)   Mood and Affect  Mood:Anxious  Affect:Flat   Thought Process  Thought Processes:Coherent  Descriptions of Associations:Intact  Orientation:Full (Time, Place and Person)  Thought Content:WDL  History of Schizophrenia/Schizoaffective disorder:No  Duration of Psychotic Symptoms:N/A Hallucinations:Hallucinations: None  Ideas of Reference:None  Suicidal Thoughts:Suicidal Thoughts: No  Homicidal Thoughts:Homicidal Thoughts: No   Sensorium  Memory:Immediate Fair; Recent Fair  Judgment:Poor  Insight:Poor   Executive Functions  Concentration:Fair  Attention Span:Fair  Recall:Fair  Fund of Knowledge:Fair  Language:Fair   Psychomotor Activity  Psychomotor Activity:Psychomotor Activity: Normal   Assets  Assets:Communication Skills; Desire for Improvement; Housing; Intimacy; Social Support; Physical Health; Resilience   Sleep  Sleep:Sleep: Fair Number of Hours of Sleep: 8.5   Physical Exam ROS  Physical Exam Constitutional:      Appearance: the patient is not toxic-appearing.  Pulmonary:     Effort: Pulmonary effort is normal.  Neurological:     General: No focal deficit present.     Mental Status: the patient is alert and oriented to person, place, and time.   Review of Systems  Respiratory:  Negative for shortness of breath.   Cardiovascular:  Negative for chest pain.  Gastrointestinal:  Negative for abdominal pain, constipation, diarrhea, nausea and vomiting.  Neurological:  Negative for headaches.   Blood pressure (!) 149/108, pulse (!) 105, temperature 98.3  F (36.8 C), temperature source Oral, resp. rate 16, height 5\' 3"  (1.6 m), weight 76.2 kg, SpO2 98%. Body mass index is 29.76 kg/m.  Treatment Plan Summary: Daily contact with patient to assess and evaluate symptoms and progress in treatment, Medication management, and Plan    ASSESSMENT: [name] is a 47 y.o. female  with a past psychiatric history of bipolar disorder, major depressive disorder, sedative dependence, panic disorder with agoraphobia. Patient initially arrived to [facility] on 04/01/23 for 2 self-inflected lacerations, and admitted to [facility] under IVC on 04/02/23 for acute suicidal or self-harming behaviors. PPHx is significant for bipolar disorder, and history of Suicide Attempts (x2), Self Injurious Behavior (cutting, none in past 20 years), and multiple prior psychiatric hospitalizations [facility] 2008x2, 2009x2, 2010, 2011, 2012, 2015, 2016) . PMHx is significant for colitis, GERD, chronic bronchitis, hyperlipidemia.   Diagnoses / Active Problems: -Major Depressive Disorder, recurrent episode, severe (r/o bipolar II) -Generalized anxiety disorder with panic attacks -Opioid dependence -Alcohol use   PLAN: Safety and Monitoring:  --  INVOLUNTARY  admission to inpatient psychiatric unit for safety, stabilization and treatment  -- Daily contact with patient to assess and evaluate symptoms and progress  in treatment  -- Patient's case to be discussed in multi-disciplinary team meeting  -- Observation Level : q15 minute checks  -- Vital signs:  q12 hours  -- Precautions: suicide, elopement, and assault  2. Psychiatric Diagnoses and Treatment:  -- Continue zoloft 100mg  for depression and anxiety (started in the ED), will monitor for mania  -- Continue CIWA with PRN ativan for alcohol withdrawal  -- Continue thiamine and MV for alcohol use disorder   -- Per PDMP, patient previously rx'ed percocet 10 for 6 days (03/28/23). Prior to that, was getting percocet 40mg  daily from  04/2022-12/2022.   -- The risks/benefits/side-effects/alternatives to this medication were discussed in detail with the patient and time was given for questions. The patient consents to medication trial.              -- Metabolic profile and EKG monitoring obtained while on an atypical antipsychotic  BMI: 29.76 TSH: pending  Lipid Panel: pending HbgA1c: pending QTc: 483             -- Encouraged patient to participate in unit milieu and in scheduled group therapies   -- Short Term Goals: Ability to identify changes in lifestyle to reduce recurrence of condition will improve, Ability to verbalize feelings will improve, Ability to disclose and discuss suicidal ideas, Ability to demonstrate self-control will improve, Ability to identify and develop effective coping behaviors will improve, Ability to maintain clinical measurements within normal limits will improve, Compliance with prescribed medications will improve, and Ability to identify triggers associated with substance abuse/mental health issues will improve  -- Long Term Goals: Improvement in symptoms so as ready for discharge  Other PRNS:               -- start acetaminophen 650 mg every 6 hours as needed for mild to moderate pain, fever, and headaches              -- start hydroxyzine 25 mg three times a day as needed for anxiety              -- start aluminum-magnesium hydroxide + simethicone 30 mL every 4 hours as needed for heartburn or indigestion              -- start trazodone 50 mg at bedtime as needed for insomnia -- start clonidine 0.1mg  Q6H PRN for systolic BP > 180, diastolic > 100  -- As needed agitation protocol in-place  3. Medical Issues Being Addressed:   #Tobacco Use Disorder  Nicotine patch 21mg /24 hours ordered PRN Nicorette gum ordered Smoking cessation encouraged  #GERD Protonix 40mg  daily  #L forearm laceration -Stop oxy 5mg  Q6H PRN  -continue percocet q8H pRN for moderate pain for 3 days   4. Discharge  Planning:   -- Social work and case management to assist with discharge planning and identification of hospital follow-up needs prior to discharge  -- Estimated LOS: 5-7 days  -- Discharge Concerns: Need to establish a safety plan; Medication compliance and effectiveness  -- Discharge Goals: Return home with outpatient referrals for mental health follow-up including medication management/psychotherapy   I certify that inpatient services furnished can reasonably be expected to improve the patient's condition.   This note was created using a voice recognition software as a result there may be grammatical errors inadvertently enclosed that do not reflect the nature of this encounter. Every attempt is made to correct such errors.   Signed: Dr. Karie Fetch, MD PGY-2, Psychiatry Residency  9/14/20243:30 PM

## 2023-04-02 NOTE — [facility] Suicide Risk Assessment (Signed)
[facility] INPATIENT:  Family/Significant Other Suicide Prevention Education  Suicide Prevention Education:  Education Completed; [name],  (Boyfriend, [phone]) has been identified by the patient as the family member/significant other with whom the patient will be residing, and identified as the person(s) who will aid the patient in the event of a mental health crisis (suicidal ideations/suicide attempt).  With written consent from the patient, the family member/significant other has been provided the following suicide prevention education, prior to the and/or following the discharge of the patient.  The suicide prevention education provided includes the following: Suicide risk factors Suicide prevention and interventions National Suicide Hotline telephone number [facility] assessment telephone number [facility] Emergency Assistance 911 [facility] and/or Residential Mobile Crisis Unit telephone number  Request made of family/significant other to: Remove weapons (e.g., guns, rifles, knives), all items previously/currently identified as safety concern.   Remove drugs/medications (over-the-counter, prescriptions, illicit drugs), all items previously/currently identified as a safety concern.  The family member/significant other verbalizes understanding of the suicide prevention education information provided.  The family member/significant other agrees to remove the items of safety concern listed above.  [name] 04/02/2023, 11:20 AM

## 2023-04-02 NOTE — Progress Notes (Signed)
   04/02/23 0806  Psych Admission Type (Psych Patients Only)  Admission Status Involuntary  Psychosocial Assessment  Patient Complaints Anxiety  Eye Contact Fair  Facial Expression Anxious  Affect Anxious  Speech Logical/coherent  Interaction Assertive  Motor Activity Slow  Appearance/Hygiene Disheveled  Behavior Characteristics Cooperative;Anxious  Mood Anxious  Thought Process  Coherency WDL  Content WDL  Delusions None reported or observed  Perception WDL  Hallucination None reported or observed  Judgment Poor  Confusion None  Danger to Self  Current suicidal ideation? Denies  Danger to Others  Danger to Others None reported or observed

## 2023-04-03 DIAGNOSIS — F332 Major depressive disorder, recurrent severe without psychotic features: Secondary | ICD-10-CM | POA: Diagnosis not present

## 2023-04-03 LAB — TSH: TSH: 1.848 u[IU]/mL (ref 0.350–4.500)

## 2023-04-03 LAB — LIPID PANEL
Cholesterol: 161 mg/dL (ref 0–200)
HDL: 56 mg/dL (ref >40)
LDL Cholesterol: 92 mg/dL (ref 0–99)
Total CHOL/HDL Ratio: 2.9 ratio
Triglycerides: 66 mg/dL (ref <150)
VLDL: 13 mg/dL (ref 0–40)

## 2023-04-03 MED ORDER — LORAZEPAM 1 MG PO TABS: 1.0000 mg | ORAL_TABLET | Freq: Four times a day (QID) | ORAL | Status: DC | PRN

## 2023-04-03 MED ORDER — IBUPROFEN 200 MG PO TABS
200.0000 mg | ORAL_TABLET | Freq: Four times a day (QID) | ORAL | Status: DC | PRN
  Administered 2023-04-03 – 2023-04-04 (×2): 200 mg via ORAL
  Filled 2023-04-03 (×2): qty 1

## 2023-04-03 NOTE — Group Note (Signed)
[facility] LCSW Group Therapy Note  Date/Time:  04/03/2023 10:00am-11:00am  Type of Therapy and Topic:  Group Therapy:  Healthy and Unhealthy Coping Skills and Supports  Participation Level:  Did Not Attend   Description of Group:  Patients in this group explored the differences between healthy and unhealthy, specifically with coping skills and supports, using the worksheet below.  Many examples were provided by CSW and group members.  The emphasis was on providing hope for change.  A demonstration was given about how to set boundaries with family and/or friends, which patients expressed was beneficial.    Therapeutic Goals:   1)  compare healthy versus unhealthy coping skills and healthy versus unhealthy supports 2)  identify examples of each and demonstrate commonalities within the group  3)  generate ideas of healthy coping skills and supports that can be added  4)  discuss importance of adding healthy supports and setting boundaries with unhealthy supports  5)  offer mutual support about how to address unhealthy coping skills and supports  6)  encourage active participation in group   Healthy vs. Unhealthy  Coping Skills and Supports   Unhealthy Qualities                                             Healthy Qualities Works (at first) Works   Stops working or starts Interior and spatial designer working  Fast Usually takes time to develop  Easy Often difficult to learn  Often effortless, can be done without thought Usually takes effort, thinking about it  Usually a habit Usually unknown, has to become a habit  Can do alone Often need to reach out for help   Leads to loss Leads to gain         My Unhealthy Coping Skills                                    My Healthy Coping Skills                       My Unhealthy Supports                                           My Healthy Supports                       Summary of Patient Progress:  The patient was invited to group but  chose not to attend.   Therapeutic Modalities:   Psychoeducation Brief Solution-Focused Therapy  Ambrose Mantle, LCSW

## 2023-04-03 NOTE — Plan of Care (Signed)
  Problem: Education: Goal: Emotional status will improve Outcome: Progressing   Problem: Education: Goal: Verbalization of understanding the information provided will improve Outcome: Progressing

## 2023-04-03 NOTE — Progress Notes (Signed)
   04/03/23 0527  15 Minute Checks  Location Bedroom  Visual Appearance Calm  Behavior Sleeping  Sleep (Behavioral Health Patients Only)  Calculate sleep? (Click Yes once per 24 hr at 0600 safety check) Yes  Documented sleep last 24 hours 7.5

## 2023-04-03 NOTE — [facility] Group Notes (Signed)
Psychoeducational Group Note  Date:  04/03/2023 Time:  2000  Group Topic/Focus:  Wrap up group  Participation Level: Did Not Attend  Participation Quality:  Not Applicable  Affect:  Not Applicable  Cognitive:  Not Applicable  Insight:  Not Applicable  Engagement in Group: Not Applicable  Additional Comments:  Did not attend.   Marcille Buffy 04/03/2023, 9:09 PM

## 2023-04-03 NOTE — Progress Notes (Addendum)
D: Patient is alert, oriented, anxious, and cooperative. Denies SI, HI, AVH, and verbally contracts for safety. Patient reports she slept fair last night with sleeping medication. Patient reports her appetite as fair, energy level as normal, and concentration as good. Patient rates her depression 3/10, hopelessness 1/10, and anxiety 6/10. Patient reports pain in arm rated 6/10 after night shift gave PRN pain medication.   CIWA 4 at 1600.  A: Scheduled medications administered per MD order. Support provided. Patient educated on safety on the unit and medications. Routine safety checks every 15 minutes. Patient stated understanding to tell nurse about any new physical symptoms. Patient understands to tell staff of any needs.     R: No adverse drug reactions noted. Patient remains safe at this time and will continue to monitor.    04/03/23 0900  Psych Admission Type (Psych Patients Only)  Admission Status Involuntary  Psychosocial Assessment  Patient Complaints Anxiety  Eye Contact Fair  Facial Expression Anxious  Affect Appropriate to circumstance  Speech Logical/coherent  Interaction Assertive  Motor Activity Slow  Appearance/Hygiene Disheveled  Behavior Characteristics Cooperative;Appropriate to situation;Anxious  Mood Anxious;Pleasant  Thought Process  Coherency WDL  Content WDL  Delusions None reported or observed  Perception WDL  Hallucination None reported or observed  Judgment Limited  Confusion None  Danger to Self  Current suicidal ideation? Denies  Danger to Others  Danger to Others None reported or observed

## 2023-04-03 NOTE — Progress Notes (Signed)
   04/03/23 2100  Psych Admission Type (Psych Patients Only)  Admission Status Involuntary  Psychosocial Assessment  Patient Complaints Anxiety  Eye Contact Fair  Facial Expression Anxious  Affect Appropriate to circumstance  Speech Logical/coherent  Interaction Assertive  Motor Activity Slow  Appearance/Hygiene Disheveled  Behavior Characteristics Cooperative  Mood Anxious;Pleasant  Aggressive Behavior  Effect No apparent injury  Thought Process  Coherency WDL  Content WDL  Delusions WDL  Perception WDL  Hallucination None reported or observed  Judgment Poor  Confusion None  Danger to Self  Current suicidal ideation? Denies

## 2023-04-03 NOTE — Plan of Care (Signed)
  Problem: Education: Goal: Emotional status will improve Outcome: Progressing Goal: Mental status will improve Outcome: Progressing   Problem: Activity: Goal: Sleeping patterns will improve Outcome: Progressing   Problem: Safety: Goal: Periods of time without injury will increase Outcome: Progressing   Problem: Safety: Goal: Ability to remain free from injury will improve Outcome: Progressing

## 2023-04-03 NOTE — Progress Notes (Signed)
[facility] MD Progress Note  04/03/2023 6:46 AM [name]  MRN:  [id number]  Principal Problem: Major depressive disorder, recurrent episode, severe (HCC) Diagnosis: Principal Problem:   Major depressive disorder, recurrent episode, severe (HCC) Active Problems:   Generalized anxiety disorder   Opioid dependence (HCC)   Alcohol use  Reason for Admission:  [name] is a 47 y.o. female  with a past psychiatric history of bipolar disorder, major depressive disorder, sedative dependence, panic disorder with agoraphobia. Patient initially arrived to [facility] on 04/01/23 for 2 self-inflected lacerations, and admitted to [facility] under IVC on 04/02/23 for acute suicidal or self-harming behaviors. PPHx is significant for bipolar disorder, and history of Suicide Attempts (x2), Self Injurious Behavior (cutting, none in past 20 years), and multiple prior psychiatric hospitalizations [facility] 2008x2, 2009x2, 2010, 2011, 2012, 2015, 2016) . PMHx is significant for colitis, GERD, chronic bronchitis, hyperlipidemia.   (admitted on 04/01/2023, total  LOS: 2 days )  Yesterday, the psychiatry team made following recommendations:  -- Continue zoloft 100mg  for depression and anxiety (started in the ED), will monitor for mania            -- Continue CIWA with PRN ativan for alcohol withdrawal            -- Continue thiamine and MV for alcohol use disorder   Chart review: Overnight Events HR 102. BP 110/84. MAR was reviewed and patient was compliant with medications.  Patient received PRN ativan x2, percocet x2. CIWA 6, 3. Labs pending. Slept 7.5 hours.   Pertinent information discussed during bed progression:  Case was discussed in the multidisciplinary team. Reports dressing was changed.   Information Obtained Today During Patient Interview: Patient evaluated on the unit. Reports sleep no complaints, reports some grogginess this AM. Reports appetite no changes. States mood is "I wish I had never done it" today. Today  patient reports less anxiety today. . Side effects to currently prescribed medications are none. There are no somatic complaints. Reports regular bowel movements.. Reports physical complaints are the lacerations is more irritating than painful. Denies alcohol withdrawal symptoms besides anxiety. Thought that the CIWA ativan was for anxiety. Discussed CIWA ativan for alcohol withdrawal. Discussed stopping percocet for her forearm pain and proceeding with alternating tylenol and ibuprofen.    On interview, suicidal ideations are not present . Homicidal ideations are not present.  There are no auditory hallucinations, visual hallucinations, paranoid ideations, or delusional thought processes.   Past Psychiatric History:  Current Psychiatrist: None Current Therapist: None Previous Psychiatric Diagnoses: Bipolar disorder, GAD, Borderline Personality Disorder, MDD  Current psychiatric medications: percocet, trazodone Psychiatric medication history/compliance: Reports quit taking everything all at once. History of taking Zoloft, Xanax, Seroquel, Ambien, lamictal, propranolol. Risperidone - made back of throat clog up.  Psychiatric Hospitalization hx: [facility] 2008x2, 2009x2, 2010, 2011, 2012, 2015, 2016) Psychotherapy hx: none Neuromodulation history: none  History of suicide (obtained from HPI): Twice in the past per chart review (patient denied).  History of homicide or aggression (obtained in HPI): Denies   Family Psychiatric History:  Psychiatric Dx: Mom - panic attacks. Aunt - depression, anxiety.  Suicide Hx: Denies Violence/Aggression: Denies  Substance use: Denies   Social History:  Living Situation: Reports living with boyfriend Education: Completed high school Occupational hx: unemployed Marital Status: Separated  Children: 2 (18, 24 y/o)  Legal: denies  Hotel manager: denies    Access to firearms: denies   Past Medical History:  Past Medical History:  Diagnosis Date  Anxiety     Anxiety    Bipolar 1 disorder (HCC)    Bronchitis    history of   Chronic bronchitis (HCC)    Cigarette smoker    Colitis    Depression    Depression    Endometriosis    GERD (gastroesophageal reflux disease)    uses otc acid reducer   Headache(784.0)    Hyperlipidemia    Renal disorder    kidney stones   UTI (lower urinary tract infection)    Family History:  Family History  Problem Relation Age of Onset   Heart attack Mother    Kidney Stones Mother    Heart attack Father    Ovarian cancer Maternal Grandmother     Current Medications: Current Facility-Administered Medications  Medication Dose Route Frequency Provider Last Rate Last Admin   acetaminophen (TYLENOL) tablet 650 mg  650 mg Oral Q6H PRN Onuoha, Josephine C, NP       albuterol (PROVENTIL) (2.5 MG/3ML) 0.083% nebulizer solution 2.5 mg  2.5 mg Nebulization Q6H PRN Massengill, Harrold Donath, MD       alum & mag hydroxide-simeth (MAALOX/MYLANTA) 200-200-20 MG/5ML suspension 30 mL  30 mL Oral Q4H PRN Welford Roche, Josephine C, NP       cloNIDine (CATAPRES) tablet 0.1 mg  0.1 mg Oral Q6H PRN Karie Fetch, MD       diphenhydrAMINE (BENADRYL) capsule 50 mg  50 mg Oral TID PRN Dahlia Byes C, NP       Or   diphenhydrAMINE (BENADRYL) injection 50 mg  50 mg Intramuscular TID PRN Dahlia Byes C, NP       folic acid (FOLVITE) tablet 1 mg  1 mg Oral Daily Onuoha, Josephine C, NP   1 mg at 04/02/23 0805   haloperidol (HALDOL) tablet 5 mg  5 mg Oral TID PRN Dahlia Byes C, NP       Or   haloperidol lactate (HALDOL) injection 5 mg  5 mg Intramuscular TID PRN Dahlia Byes C, NP       hydrOXYzine (ATARAX) tablet 25 mg  25 mg Oral TID PRN Karie Fetch, MD   25 mg at 04/02/23 2108   loperamide (IMODIUM) capsule 4 mg  4 mg Oral Q6H PRN Karie Fetch, MD   4 mg at 04/02/23 1449   LORazepam (ATIVAN) tablet 0-4 mg  0-4 mg Oral Q6H Onuoha, Josephine C, NP   1 mg at 04/03/23 7829   Followed by   LORazepam (ATIVAN)  tablet 0-4 mg  0-4 mg Oral Q12H Onuoha, Josephine C, NP       LORazepam (ATIVAN) tablet 1-4 mg  1-4 mg Oral Q1H PRN Dahlia Byes C, NP       Or   LORazepam (ATIVAN) tablet 1 mg  1 mg Oral Q6H PRN Dahlia Byes C, NP       multivitamin with minerals tablet 1 tablet  1 tablet Oral Daily Dahlia Byes C, NP   1 tablet at 04/02/23 0805   neomycin-bacitracin-polymyxin (NEOSPORIN) ointment 1 Application  1 Application Topical BID Otho Bellows, RPH   1 Application at 04/02/23 5621   nicotine (NICODERM CQ - dosed in mg/24 hours) patch 21 mg  21 mg Transdermal Daily PRN Karie Fetch, MD       nicotine polacrilex (NICORETTE) gum 2 mg  2 mg Oral Q4H PRN Karie Fetch, MD       oxyCODONE-acetaminophen (PERCOCET/ROXICET) 5-325 MG per tablet 1 tablet  1 tablet Oral Q8H PRN Karie Fetch,  MD   1 tablet at 04/03/23 0623   pantoprazole (PROTONIX) EC tablet 40 mg  40 mg Oral Daily Dahlia Byes C, NP   40 mg at 04/02/23 0805   sertraline (ZOLOFT) tablet 100 mg  100 mg Oral Daily Onuoha, Josephine C, NP   100 mg at 04/02/23 0805   thiamine (Vitamin B-1) tablet 100 mg  100 mg Oral Daily Onuoha, Josephine C, NP   100 mg at 04/02/23 8657   Or   thiamine (VITAMIN B1) injection 100 mg  100 mg Intravenous Daily Dahlia Byes C, NP       traZODone (DESYREL) tablet 50 mg  50 mg Oral QHS PRN Bobbitt, Shalon E, NP   50 mg at 04/02/23 2108    Lab Results: No results found for this or any previous visit (from the past 48 hour(s)).  Blood Alcohol level:  Lab Results  Component Value Date   ETH 247 (H) 04/01/2023   ETH 134 (H) 04/02/2017    Metabolic Labs: Lab Results  Component Value Date   HGBA1C 5.3 12/03/2014   MPG 105 12/03/2014   MPG 105 01/03/2014   No results found for: "PROLACTIN" Lab Results  Component Value Date   CHOL 124 12/03/2014   TRIG 77 12/03/2014   HDL 27 (L) 12/03/2014   CHOLHDL 4.6 12/03/2014   VLDL 15 12/03/2014   LDLCALC 82 12/03/2014    Sleep:Sleep:  Fair Number of Hours of Sleep: 8.5   Physical Findings: AIMS: No  CIWA:  CIWA-Ar Total: 3 COWS:     Psychiatric Specialty Exam:  Presentation  General Appearance: Appropriate for Environment  Eye Contact:Minimal  Speech:Clear and Coherent  Speech Volume:Normal  Handedness:-- (not assessed)   Mood and Affect  Mood:Anxious  Affect:Flat   Thought Process  Thought Processes:Coherent  Descriptions of Associations:Intact  Orientation:Full (Time, Place and Person)  Thought Content:WDL  History of Schizophrenia/Schizoaffective disorder:No  Duration of Psychotic Symptoms:No data recorded Hallucinations:Hallucinations: None  Ideas of Reference:None  Suicidal Thoughts:Suicidal Thoughts: No  Homicidal Thoughts:Homicidal Thoughts: No   Sensorium  Memory:Immediate Fair; Recent Fair  Judgment:Poor  Insight:Poor   Executive Functions  Concentration:Fair  Attention Span:Fair  Recall:Fair  Fund of Knowledge:Fair  Language:Fair   Psychomotor Activity  Psychomotor Activity:Psychomotor Activity: Normal   Assets  Assets:Communication Skills; Desire for Improvement; Housing; Intimacy; Social Support; Physical Health; Resilience   Sleep  Sleep:Sleep: Fair Number of Hours of Sleep: 8.5  Physical Exam ROS Physical Exam Constitutional:      Appearance: the patient is not toxic-appearing.  Pulmonary:     Effort: Pulmonary effort is normal.  Neurological:     General: No focal deficit present.     Mental Status: the patient is alert and oriented to person, place, and time.    L forearm: dressing intact  Review of Systems  Respiratory:  Negative for shortness of breath.   Cardiovascular:  Negative for chest pain.  Gastrointestinal:  Negative for abdominal pain, constipation, diarrhea, nausea and vomiting.  Neurological:  Negative for headaches.   Blood pressure 110/84, pulse (!) 102, temperature 98.2 F (36.8 C), temperature source Oral, resp.  rate 17, height 5\' 3"  (1.6 m), weight 76.2 kg, SpO2 97%. Body mass index is 29.76 kg/m.  Treatment Plan Summary: Daily contact with patient to assess and evaluate symptoms and progress in treatment, Medication management, and Plan     ASSESSMENT: [name] is a 47 y.o. female  with a past psychiatric history of bipolar disorder, major depressive disorder,  sedative dependence, panic disorder with agoraphobia. Patient initially arrived to [facility] on 04/01/23 for 2 self-inflected lacerations, and admitted to [facility] under IVC on 04/02/23 for acute suicidal or self-harming behaviors. PPHx is significant for bipolar disorder, and history of Suicide Attempts (x2), Self Injurious Behavior (cutting, none in past 20 years), and multiple prior psychiatric hospitalizations [facility] 2008x2, 2009x2, 2010, 2011, 2012, 2015, 2016) . PMHx is significant for colitis, GERD, chronic bronchitis, hyperlipidemia.   CIWAs downtrending. Denying withdrawal symptoms. Reporting improved anxiety. Continuing to report minimal substance use prior to admission.   Diagnoses / Active Problems: -Major Depressive Disorder, recurrent episode, severe (r/o bipolar II) -Generalized anxiety disorder with panic attacks -Opioid dependence -Alcohol use -Sedative abuse  PLAN: Safety and Monitoring:   --  INVOLUNTARY  admission to inpatient psychiatric unit for safety, stabilization and treatment            -- Daily contact with patient to assess and evaluate symptoms and progress in treatment            -- Patient's case to be discussed in multi-disciplinary team meeting            -- Observation Level : q15 minute checks            -- Vital signs:  q12 hours            -- Precautions: suicide, elopement, and assault  2. Psychiatric Diagnoses and Treatment:  -- Continue zoloft 100mg  for depression and anxiety (started in the ED), will monitor for mania -- Continue CIWA with PRN ativan for alcohol withdrawal, changed to CIWA >10 for PRN  ativan            -- Continue thiamine and MV for alcohol use disorder   -- Discontinued percocet for forearm pain  -- The risks/benefits/side-effects/alternatives to this medication were discussed in detail with the patient and time was given for questions. The patient consents to medication trial.              -- Metabolic profile and EKG monitoring obtained while on an atypical antipsychotic  BMI: 29.76 TSH:  Lab Results  Component Value Date   TSH 1.848 04/03/2023  Lipid Panel:  Lab Results  Component Value Date   CHOL 161 04/03/2023   HDL 56 04/03/2023   LDLCALC 92 04/03/2023   TRIG 66 04/03/2023   CHOLHDL 2.9 04/03/2023  HbgA1c:  Lab Results  Component Value Date   HGBA1C 5.3 12/03/2014  QTc: 483             -- Encouraged patient to participate in unit milieu and in scheduled group therapies    -- Short Term Goals: Ability to identify changes in lifestyle to reduce recurrence of condition will improve, Ability to verbalize feelings will improve, Ability to disclose and discuss suicidal ideas, Ability to demonstrate self-control will improve, Ability to identify and develop effective coping behaviors will improve, Ability to maintain clinical measurements within normal limits will improve, Compliance with prescribed medications will improve, and Ability to identify triggers associated with substance abuse/mental health issues will improve            -- Long Term Goals: Improvement in symptoms so as ready for discharge  Other PRNS:               -- start acetaminophen 650 mg every 6 hours as needed for mild to moderate pain, fever, and headaches              --  start hydroxyzine 25 mg three times a day as needed for anxiety              -- start aluminum-magnesium hydroxide + simethicone 30 mL every 4 hours as needed for heartburn or indigestion              -- start trazodone 50 mg at bedtime as needed for insomnia -- start clonidine 0.1mg  Q6H PRN for systolic BP > 180,  diastolic > 100            -- As needed agitation protocol in-place   3. Medical Issues Being Addressed:   #Tobacco Use Disorder  Nicotine patch 21mg /24 hours ordered PRN Nicorette gum ordered Smoking cessation encouraged   #GERD Protonix 40mg  daily   #L forearm laceration -Stop oxy 5mg  Q6H PRN  -stop percocet q8H pRN for moderate pain for 3 days  - start PRN advil   4. Discharge Planning:   -- Social work and case management to assist with discharge planning and identification of hospital follow-up needs prior to discharge  -- Estimated LOS: Possible d/c Tuesday  -- Discharge Concerns: Need to establish a safety plan; Medication compliance and effectiveness  -- Discharge Goals: Return home with outpatient referrals for mental health follow-up including medication management/psychotherapy   I certify that inpatient services furnished can reasonably be expected to improve the patient's condition.   This note was created using a voice recognition software as a result there may be grammatical errors inadvertently enclosed that do not reflect the nature of this encounter. Every attempt is made to correct such errors.   Dr. Karie Fetch, MD PGY-2, Psychiatry Residency  9/15/20246:46 AM

## 2023-04-04 ENCOUNTER — Encounter (HOSPITAL_COMMUNITY): Payer: Self-pay

## 2023-04-04 LAB — HEMOGLOBIN A1C
Hgb A1c MFr Bld: 5.2 % (ref 4.8–5.6)
Mean Plasma Glucose: 103 mg/dL

## 2023-04-04 NOTE — Group Note (Signed)
Recreation Therapy Group Note   Group Topic:Team Building  Group Date: 04/04/2023 Start Time: 0935 End Time: 0955 Facilitators: Ralphael Southgate-McCall, LRT,CTRS Location: [address] Dayroom   Goal Area(s) Addresses:  Patient will effectively work with peer towards shared goal.  Patient will identify skills used to make activity successful.  Patient will identify how skills used during activity can be applied to reach post d/c goals.   Intervention: STEM Activity- Glass blower/designer  Group Description: Tallest Pharmacist, community. In teams of 5-6, patients were given 11 craft pipe cleaners. Using the materials provided, patients were instructed to compete again the opposing team(s) to build the tallest free-standing structure from floor level. The activity was timed; difficulty increased by Clinical research associate as Production designer, theatre/television/film continued.  Systematically resources were removed with additional directions for example, placing one arm behind their back, working in silence, and shape stipulations. LRT facilitated post-activity discussion reviewing team processes and necessary communication skills involved in completion. Patients were encouraged to reflect how the skills utilized, or not utilized, in this activity can be incorporated to positively impact support systems post discharge.   Affect/Mood: N/A   Participation Level: Did not attend    Clinical Observations/Individualized Feedback:    Plan: Continue to engage patient in RT group sessions 2-3x/week.   [name], LRT,CTRS 04/04/2023 12:11 PM

## 2023-04-04 NOTE — Progress Notes (Signed)
   04/04/23 0601  15 Minute Checks  Location Bedroom  Visual Appearance Calm  Behavior Sleeping  Sleep (Behavioral Health Patients Only)  Calculate sleep? (Click Yes once per 24 hr at 0600 safety check) Yes  Documented sleep last 24 hours 8

## 2023-04-04 NOTE — Progress Notes (Signed)
[facility] MD Progress Note  04/04/2023 2:29 PM [name]  MRN:  [id number]  Principal Problem: Major depressive disorder, recurrent episode, severe (HCC) Diagnosis: Principal Problem:   Major depressive disorder, recurrent episode, severe (HCC) Active Problems:   Generalized anxiety disorder   Opioid dependence (HCC)   Alcohol use  Reason for Admission:  [name] is a 47 y.o. female  with a past psychiatric history of bipolar disorder, major depressive disorder, sedative dependence, panic disorder with agoraphobia,  Suicide Attempts (x2), Self Injurious Behavior (cutting, none in past 20 years), and multiple prior psychiatric hospitalizations [facility] 2008x2, 2009x2, 2010, 2011, 2012, 2015, 2016). Patient initially arrived to [facility] on 04/01/23 for 2 self-inflected lacerations, and (admitted on 04/01/2023, total  LOS: 3 days ) to [facility]. She is currently here under IVC.   Chart Review from last 24 hours:  The patient's chart was reviewed and nursing notes were reviewed. The patient's case was discussed in multidisciplinary team meeting.   - Overnight events to report per chart review / staff report: no notable overnight events to report - Patient received all scheduled medications - Patient received the following PRN medications: hydroxyzine 25 mg  Information Obtained Today During Patient Interview: The patient was seen and evaluated on the unit. On assessment today the patient reports improving depression and anxiety. Reports that cutting was away of her releasing her frustrations and anxious energy. Denies any current SI, HI or AVH. Desires home discharge with boyfriend [name]. States that he could pick her up and watch her.   Patient endorses fair sleep; endorses good appetite.  Patient does not endorse any side-effects they attribute to medications.  Collateral Information:  [name] boyfriend: [phone], this call was conducted with the assistance of medical student, I was present  throughout the conversation and available to assist   Per report, [name] feels safe with Drema returning home. Denies any current weapons or guns in the home. Will be able to monitor the patient and is ready for her to come home once discharged. Angelique Blonder any safety concerns currently.    Past Psychiatric History:  Current Psychiatrist: None Current Therapist: None Previous Psychiatric Diagnoses: Bipolar disorder, GAD, Borderline Personality Disorder, MDD  Current psychiatric medications: percocet, trazodone Psychiatric medication history/compliance: Reports quit taking everything all at once. History of taking Zoloft, Xanax, Seroquel, Ambien, lamictal, propranolol. Risperidone - made back of throat clog up.  Psychiatric Hospitalization hx: [facility] 2008x2, 2009x2, 2010, 2011, 2012, 2015, 2016) Psychotherapy hx: none Neuromodulation history: none  History of suicide (obtained from HPI): Twice in the past per chart review (patient denied).  History of homicide or aggression (obtained in HPI): Denies   Past Medical History:  Past Medical History:  Diagnosis Date   Anxiety    Anxiety    Bipolar 1 disorder (HCC)    Bronchitis    history of   Chronic bronchitis (HCC)    Cigarette smoker    Colitis    Depression    Depression    Endometriosis    GERD (gastroesophageal reflux disease)    uses otc acid reducer   Headache(784.0)    Hyperlipidemia    Renal disorder    kidney stones   UTI (lower urinary tract infection)     Family Psychiatric History:  Psychiatric Dx: Mom - panic attacks. Aunt - depression, anxiety.  Suicide Hx: Denies Violence/Aggression: Denies  Substance use: Denies   Social History:  Living Situation: Reports living with boyfriend Education: Completed high school Occupational hx: unemployed Marital  Status: Separated  Children: 2 (63, 71 y/o)  Legal: denies  Hotel manager: denies   Access to firearms: denies   Current Medications: Current Facility-Administered  Medications  Medication Dose Route Frequency Provider Last Rate Last Admin   acetaminophen (TYLENOL) tablet 650 mg  650 mg Oral Q6H PRN Onuoha, Josephine C, NP       albuterol (PROVENTIL) (2.5 MG/3ML) 0.083% nebulizer solution 2.5 mg  2.5 mg Nebulization Q6H PRN Massengill, Harrold Donath, MD       alum & mag hydroxide-simeth (MAALOX/MYLANTA) 200-200-20 MG/5ML suspension 30 mL  30 mL Oral Q4H PRN Dahlia Byes C, NP       cloNIDine (CATAPRES) tablet 0.1 mg  0.1 mg Oral Q6H PRN Karie Fetch, MD       diphenhydrAMINE (BENADRYL) capsule 50 mg  50 mg Oral TID PRN Dahlia Byes C, NP       Or   diphenhydrAMINE (BENADRYL) injection 50 mg  50 mg Intramuscular TID PRN Earney Navy, NP       folic acid (FOLVITE) tablet 1 mg  1 mg Oral Daily Onuoha, Josephine C, NP   1 mg at 04/04/23 0900   haloperidol (HALDOL) tablet 5 mg  5 mg Oral TID PRN Dahlia Byes C, NP       Or   haloperidol lactate (HALDOL) injection 5 mg  5 mg Intramuscular TID PRN Dahlia Byes C, NP       hydrOXYzine (ATARAX) tablet 25 mg  25 mg Oral TID PRN Karie Fetch, MD   25 mg at 04/03/23 2106   ibuprofen (ADVIL) tablet 200 mg  200 mg Oral Q6H PRN Karie Fetch, MD   200 mg at 04/03/23 2106   loperamide (IMODIUM) capsule 4 mg  4 mg Oral Q6H PRN Karie Fetch, MD   4 mg at 04/02/23 1449   LORazepam (ATIVAN) tablet 1 mg  1 mg Oral Q6H PRN Karie Fetch, MD       multivitamin with minerals tablet 1 tablet  1 tablet Oral Daily Dahlia Byes C, NP   1 tablet at 04/04/23 0900   neomycin-bacitracin-polymyxin (NEOSPORIN) ointment 1 Application  1 Application Topical BID Otho Bellows, RPH   1 Application at 04/03/23 0820   nicotine (NICODERM CQ - dosed in mg/24 hours) patch 21 mg  21 mg Transdermal Daily PRN Karie Fetch, MD       nicotine polacrilex (NICORETTE) gum 2 mg  2 mg Oral Q4H PRN Karie Fetch, MD       pantoprazole (PROTONIX) EC tablet 40 mg  40 mg Oral Daily Onuoha, Josephine C, NP   40 mg  at 04/04/23 0900   sertraline (ZOLOFT) tablet 100 mg  100 mg Oral Daily Onuoha, Josephine C, NP   100 mg at 04/04/23 0936   thiamine (Vitamin B-1) tablet 100 mg  100 mg Oral Daily Onuoha, Josephine C, NP   100 mg at 04/04/23 0900   Or   thiamine (VITAMIN B1) injection 100 mg  100 mg Intravenous Daily Dahlia Byes C, NP       traZODone (DESYREL) tablet 50 mg  50 mg Oral QHS PRN Bobbitt, Shalon E, NP   50 mg at 04/03/23 2106    Lab Results:  Results for orders placed or performed during the hospital encounter of 04/01/23 (from the past 48 hour(s))  TSH     Status: None   Collection Time: 04/03/23  6:55 AM  Result Value Ref Range   TSH 1.848 0.350 - 4.500 uIU/mL  Comment: Performed by a 3rd Generation assay with a functional sensitivity of <=0.01 uIU/mL. Performed at [facility], [address] [place], [place] [address]   Lipid panel     Status: None   Collection Time: 04/03/23  6:55 AM  Result Value Ref Range   Cholesterol 161 0 - 200 mg/dL   Triglycerides 66 <604 mg/dL   HDL 56 >54 mg/dL   Total CHOL/HDL Ratio 2.9 RATIO   VLDL 13 0 - 40 mg/dL   LDL Cholesterol 92 0 - 99 mg/dL    Comment:        Total Cholesterol/HDL:CHD Risk Coronary Heart Disease Risk Table                     Men   Women  1/2 Average Risk   3.4   3.3  Average Risk       5.0   4.4  2 X Average Risk   9.6   7.1  3 X Average Risk  23.4   11.0        Use the calculated Patient Ratio above and the CHD Risk Table to determine the patient's CHD Risk.        ATP III CLASSIFICATION (LDL):  <100     mg/dL   Optimal  098-119  mg/dL   Near or Above                    Optimal  130-159  mg/dL   Borderline  147-829  mg/dL   High  >562     mg/dL   Very High Performed at [facility], [address] [place], [place] [address]   Hemoglobin A1c     Status: None   Collection Time: 04/03/23  6:55 AM  Result Value Ref Range   Hgb A1c MFr Bld 5.2 4.8 - 5.6 %    Comment:  (NOTE)         Prediabetes: 5.7 - 6.4         Diabetes: >6.4         Glycemic control for adults with diabetes: <7.0    Mean Plasma Glucose 103 mg/dL    Comment: (NOTE) Performed At: [facility] [address] [place], [place] [id number] Jolene Schimke MD BM:[id number]     Blood Alcohol level:  Lab Results  Component Value Date   ETH 247 (H) 04/01/2023   ETH 134 (H) 04/02/2017    Metabolic Labs: Lab Results  Component Value Date   HGBA1C 5.2 04/03/2023   MPG 103 04/03/2023   MPG 105 12/03/2014   No results found for: "PROLACTIN" Lab Results  Component Value Date   CHOL 161 04/03/2023   TRIG 66 04/03/2023   HDL 56 04/03/2023   CHOLHDL 2.9 04/03/2023   VLDL 13 04/03/2023   LDLCALC 92 04/03/2023   LDLCALC 82 12/03/2014    Physical Findings: AIMS: No  CIWA:  CIWA-Ar Total: 4     Psychiatric Specialty Exam: General Appearance:  Appropriate for Environment; Casual   Eye Contact:  Fair   Speech:  Clear and Coherent   Volume:  Normal   Mood:  Euthymic   Affect:  Blunt; Other (comment) (guarded)   Thought Content:  Logical   Suicidal Thoughts:  Suicidal Thoughts: No   Homicidal Thoughts:  Homicidal Thoughts: No   Thought Process:  Coherent   Orientation:  Full (Time, Place and Person)     Memory:  Immediate  Fair; Recent Fair   Judgment:  Intact   Insight:  Lacking   Concentration:  Fair   Recall:  [employer] of Knowledge:  Fair   Language:  Fair   Psychomotor Activity:  Psychomotor Activity: Normal   Assets:  Manufacturing systems engineer; Desire for Improvement; Housing; Intimacy; Social Support   Sleep:  Sleep: Good Number of Hours of Sleep: 7.5    Review of Systems Review of Systems  Constitutional:  Negative for chills and fever.  Respiratory:  Negative for cough and shortness of breath.   Cardiovascular:  Negative for chest pain.  Gastrointestinal:  Negative for diarrhea, nausea and vomiting.   Musculoskeletal:  Negative for myalgias.  Neurological:  Negative for tremors and headaches.  Psychiatric/Behavioral:  Negative for depression, hallucinations, memory loss, substance abuse and suicidal ideas. The patient is not nervous/anxious and does not have insomnia.     Vital Signs: Blood pressure (!) 124/55, pulse 94, temperature 98.4 F (36.9 C), temperature source Oral, resp. rate 19, height 5\' 3"  (1.6 m), weight 76.2 kg, SpO2 97%. Body mass index is 29.76 kg/m. Physical Exam Constitutional:      General: She is not in acute distress.    Appearance: Normal appearance. She is not toxic-appearing or diaphoretic.  Pulmonary:     Effort: Pulmonary effort is normal.  Neurological:     Mental Status: She is alert.  Psychiatric:        Attention and Perception: Attention and perception normal.        Mood and Affect: Mood is not anxious or depressed. Affect is not flat.        Speech: Speech normal. She is communicative.        Behavior: Behavior normal. Behavior is not combative. Behavior is cooperative.        Thought Content: Thought content is not paranoid or delusional. Thought content does not include homicidal or suicidal ideation. Thought content does not include homicidal or suicidal plan.     Comments: Guarded intermittently    Assets  Assets: Communication Skills; Desire for Improvement; Housing; Intimacy; Social Support   Treatment Plan Summary: Daily contact with patient to assess and evaluate symptoms and progress in treatment and Medication management  ASSESSMENT:  NAKEYAH ADDICOTT is a 47 y.o. female  with a past psychiatric history of bipolar disorder, major depressive disorder, sedative dependence, panic disorder with agoraphobia. Patient initially arrived to [facility] on 04/01/23 for 2 self-inflected lacerations, and admitted to [facility] under IVC on 04/02/23 for acute suicidal or self-harming behaviors. PPHx is significant for bipolar disorder, and history of Suicide  Attempts (x2), Self Injurious Behavior (cutting, none in past 20 years), and multiple prior psychiatric hospitalizations [facility] 2008x2, 2009x2, 2010, 2011, 2012, 2015, 2016) . PMHx is significant for colitis, GERD, chronic bronchitis, hyperlipidemia.    CIWAs downtrending. Denying withdrawal symptoms. Continues to minimize depressive symptoms. Discussed safety planning with boyfriend who states she can come home once discharged. Anticipate home discharge tomorrow or Wednesday and will attempt to set up follow-up appointments.   Diagnoses / Active Problems: Major depressive disorder, recurrent episode, severe (HCC) Principal Problem:   Major depressive disorder, recurrent episode, severe (HCC) Active Problems:   Generalized anxiety disorder   Opioid dependence (HCC)   Alcohol use  PLAN: Safety and Monitoring:  -- Involuntary admission to inpatient psychiatric unit for safety, stabilization and treatment  -- Daily contact with patient to assess and evaluate symptoms and progress in treatment  -- Patient's case to be  discussed in multi-disciplinary team meeting  -- Observation Level : q15 minute checks  -- Vital signs:  q12 hours  -- Precautions: suicide, elopement, and assault  2. Interventions (medications, psychoeducation, etc):               -- Continue Sertraline 100 mg daily for depression and anxiety, was started in the ED and monitor for mania   -- Discussed the utility of starting a mood stabilizer, patient declined trial of medications at this time, had bad side effects w/ Lamictal, Risperdal and Abilify in the past                 -- CIWA protocol w/ prn ativan for alcohol withdrawal   -- Last CIWA was 4 and hasn't required ativan last 48 hours               -- medical regimen:  GERD:  -Protonix EC 40 mg daily Alcohol Use Disorder:  -multivitamin and thiamine for alcohol   -- Patient does not need nicotine replacement  PRN medications for symptomatic management:               -- continue acetaminophen 650 mg every 6 hours as needed for mild to moderate pain, fever, and headaches              -- continue hydroxyzine 25 mg three times a day as needed for anxiety              -- continue aluminum-magnesium hydroxide + simethicone 30 mL every 4 hours as needed for heartburn or indigestion  -- As needed agitation protocol in-place  The risks/benefits/side-effects/alternatives to the above medication were discussed in detail with the patient and time was given for questions. The patient consents to medication trial. FDA black box warnings, if present, were discussed.  The patient is agreeable with the medication plan, as above. We will monitor the patient's response to pharmacologic treatment, and adjust medications as necessary.  3. Routine and other pertinent labs:             -- Metabolic profile:  BMI: Body mass index is 29.76 kg/m.  Prolactin: No results found for: "PROLACTIN"  Lipid Panel: Lab Results  Component Value Date   CHOL 161 04/03/2023   TRIG 66 04/03/2023   HDL 56 04/03/2023   CHOLHDL 2.9 04/03/2023   VLDL 13 04/03/2023   LDLCALC 92 04/03/2023   LDLCALC 82 12/03/2014    HbgA1c: Hgb A1c MFr Bld (%)  Date Value  04/03/2023 5.2    TSH: TSH (uIU/mL)  Date Value  04/03/2023 1.848  01/03/2014 1.310    EKG monitoring: Sinus Tach HR 107, low voltage pre-cordial leads QTc: 483  4. Group Therapy:  -- Encouraged patient to participate in unit milieu and in scheduled group therapies   -- Short Term Goals: Ability to identify changes in lifestyle to reduce recurrence of condition, verbalize feelings, identify and develop effective coping behaviors, maintain clinical measurements within normal limits, and identify triggers associated with substance abuse/mental health issues will improve. Improvement in ability to disclose and discuss suicidal ideas, demonstrate self-control, and comply with prescribed medications.  -- Long Term Goals:  Improvement in symptoms so as ready for discharge -- Patient is encouraged to participate in group therapy while admitted to the psychiatric unit. -- We will address other chronic and acute stressors, which contributed to the patient's Major depressive disorder, recurrent episode, severe (HCC) in order to reduce the risk of self-harm at  discharge.  5. Discharge Planning:   -- Social work and case management to assist with discharge planning and identification of hospital follow-up needs prior to discharge  -- Estimated LOS: 1-2 days  -- Discharge Concerns: Need to establish a safety plan; Medication compliance and effectiveness  -- Discharge Goals: Return home with outpatient referrals for mental health follow-up including medication management/psychotherapy  I certify that inpatient services furnished can reasonably be expected to improve the patient's condition.   Signed: Peterson Ao, MD 04/04/2023, 2:29 PM

## 2023-04-04 NOTE — Plan of Care (Signed)
Nurse discussed coping skills with patient.  

## 2023-04-04 NOTE — Progress Notes (Signed)
D:  Patient denied SI and HI, contracts for safety.  Denied A/V hallucinations. A:  Medications administered per MD orders.  Emotional support and encouragement given patient. R:  Safety maintained with 15 minute checks. Patient stated she feels she is ready for discharge.  Wants therapist.  Lives with boyfriend, history of cutting.

## 2023-04-04 NOTE — [facility] Group Notes (Signed)

## 2023-04-04 NOTE — BH IP Treatment Plan (Signed)
1Interdisciplinary Treatment and Diagnostic Plan Update  04/04/2023 Time of Session: 10:50AM [name] MRN: [id number]  Principal Diagnosis: Major depressive disorder, recurrent episode, severe (HCC)  Secondary Diagnoses: Principal Problem:   Major depressive disorder, recurrent episode, severe (HCC) Active Problems:   Generalized anxiety disorder   Opioid dependence (HCC)   Alcohol use   Current Medications:  Current Facility-Administered Medications  Medication Dose Route Frequency Provider Last Rate Last Admin   acetaminophen (TYLENOL) tablet 650 mg  650 mg Oral Q6H PRN Onuoha, Josephine C, NP       albuterol (PROVENTIL) (2.5 MG/3ML) 0.083% nebulizer solution 2.5 mg  2.5 mg Nebulization Q6H PRN Massengill, Harrold Donath, MD       alum & mag hydroxide-simeth (MAALOX/MYLANTA) 200-200-20 MG/5ML suspension 30 mL  30 mL Oral Q4H PRN Dahlia Byes C, NP       cloNIDine (CATAPRES) tablet 0.1 mg  0.1 mg Oral Q6H PRN Karie Fetch, MD       diphenhydrAMINE (BENADRYL) capsule 50 mg  50 mg Oral TID PRN Earney Navy, NP       Or   diphenhydrAMINE (BENADRYL) injection 50 mg  50 mg Intramuscular TID PRN Earney Navy, NP       folic acid (FOLVITE) tablet 1 mg  1 mg Oral Daily Onuoha, Josephine C, NP   1 mg at 04/04/23 0900   haloperidol (HALDOL) tablet 5 mg  5 mg Oral TID PRN Dahlia Byes C, NP       Or   haloperidol lactate (HALDOL) injection 5 mg  5 mg Intramuscular TID PRN Dahlia Byes C, NP       hydrOXYzine (ATARAX) tablet 25 mg  25 mg Oral TID PRN Karie Fetch, MD   25 mg at 04/03/23 2106   ibuprofen (ADVIL) tablet 200 mg  200 mg Oral Q6H PRN Karie Fetch, MD   200 mg at 04/03/23 2106   loperamide (IMODIUM) capsule 4 mg  4 mg Oral Q6H PRN Karie Fetch, MD   4 mg at 04/02/23 1449   LORazepam (ATIVAN) tablet 1 mg  1 mg Oral Q6H PRN Karie Fetch, MD       multivitamin with minerals tablet 1 tablet  1 tablet Oral Daily Dahlia Byes C, NP   1  tablet at 04/04/23 0900   neomycin-bacitracin-polymyxin (NEOSPORIN) ointment 1 Application  1 Application Topical BID Otho Bellows, RPH   1 Application at 04/03/23 0820   nicotine (NICODERM CQ - dosed in mg/24 hours) patch 21 mg  21 mg Transdermal Daily PRN Karie Fetch, MD       nicotine polacrilex (NICORETTE) gum 2 mg  2 mg Oral Q4H PRN Karie Fetch, MD       pantoprazole (PROTONIX) EC tablet 40 mg  40 mg Oral Daily Onuoha, Josephine C, NP   40 mg at 04/04/23 0900   sertraline (ZOLOFT) tablet 100 mg  100 mg Oral Daily Onuoha, Josephine C, NP   100 mg at 04/04/23 0936   thiamine (Vitamin B-1) tablet 100 mg  100 mg Oral Daily Onuoha, Josephine C, NP   100 mg at 04/04/23 0900   Or   thiamine (VITAMIN B1) injection 100 mg  100 mg Intravenous Daily Dahlia Byes C, NP       traZODone (DESYREL) tablet 50 mg  50 mg Oral QHS PRN Bobbitt, Shalon E, NP   50 mg at 04/03/23 2106   PTA Medications: Medications Prior to Admission  Medication Sig Dispense Refill Last Dose  albuterol (PROVENTIL HFA;VENTOLIN HFA) 108 (90 Base) MCG/ACT inhaler Inhale 2 puffs into the lungs every 6 (six) hours as needed for wheezing or shortness of breath.      ibuprofen (ADVIL,MOTRIN) 200 MG tablet Take 400-600 mg by mouth every 6 (six) hours as needed for moderate pain.       omeprazole (PRILOSEC) 40 MG capsule Take 1 capsule (40 mg total) by mouth daily. 30 capsule 3    oxyCODONE-acetaminophen (PERCOCET) 10-325 MG tablet Take 1 tablet by mouth every 6 (six) hours as needed for pain.      sertraline (ZOLOFT) 100 MG tablet Take 1 tablet (100 mg total) by mouth daily. (Patient not taking: Reported on 03/31/2019) 10 tablet 0     Patient Stressors: Financial difficulties   Occupational concerns   Substance abuse    Patient Strengths: Capable of independent living  Supportive family/friends   Treatment Modalities: Medication Management, Group therapy, Case management,  1 to 1 session with clinician,  Psychoeducation, Recreational therapy.   Physician Treatment Plan for Primary Diagnosis: Major depressive disorder, recurrent episode, severe (HCC) Long Term Goal(s): Improvement in symptoms so as ready for discharge   Short Term Goals: Ability to identify changes in lifestyle to reduce recurrence of condition will improve Ability to verbalize feelings will improve Ability to disclose and discuss suicidal ideas Ability to demonstrate self-control will improve Ability to identify and develop effective coping behaviors will improve Ability to maintain clinical measurements within normal limits will improve Compliance with prescribed medications will improve Ability to identify triggers associated with substance abuse/mental health issues will improve  Medication Management: Evaluate patient's response, side effects, and tolerance of medication regimen.  Therapeutic Interventions: 1 to 1 sessions, Unit Group sessions and Medication administration.  Evaluation of Outcomes: Progressing  Physician Treatment Plan for Secondary Diagnosis: Principal Problem:   Major depressive disorder, recurrent episode, severe (HCC) Active Problems:   Generalized anxiety disorder   Opioid dependence (HCC)   Alcohol use  Long Term Goal(s): Improvement in symptoms so as ready for discharge   Short Term Goals: Ability to identify changes in lifestyle to reduce recurrence of condition will improve Ability to verbalize feelings will improve Ability to disclose and discuss suicidal ideas Ability to demonstrate self-control will improve Ability to identify and develop effective coping behaviors will improve Ability to maintain clinical measurements within normal limits will improve Compliance with prescribed medications will improve Ability to identify triggers associated with substance abuse/mental health issues will improve     Medication Management: Evaluate patient's response, side effects, and tolerance  of medication regimen.  Therapeutic Interventions: 1 to 1 sessions, Unit Group sessions and Medication administration.  Evaluation of Outcomes: Progressing   RN Treatment Plan for Primary Diagnosis: Major depressive disorder, recurrent episode, severe (HCC) Long Term Goal(s): Knowledge of disease and therapeutic regimen to maintain health will improve  Short Term Goals: Ability to remain free from injury will improve, Ability to verbalize frustration and anger appropriately will improve, Ability to participate in decision making will improve, Ability to verbalize feelings will improve, Ability to identify and develop effective coping behaviors will improve, and Compliance with prescribed medications will improve  Medication Management: RN will administer medications as ordered by provider, will assess and evaluate patient's response and provide education to patient for prescribed medication. RN will report any adverse and/or side effects to prescribing provider.  Therapeutic Interventions: 1 on 1 counseling sessions, Psychoeducation, Medication administration, Evaluate responses to treatment, Monitor vital signs and CBGs as ordered, Perform/monitor  CIWA, COWS, AIMS and Fall Risk screenings as ordered, Perform wound care treatments as ordered.  Evaluation of Outcomes: Progressing   LCSW Treatment Plan for Primary Diagnosis: Major depressive disorder, recurrent episode, severe (HCC) Long Term Goal(s): Safe transition to appropriate next level of care at discharge, Engage patient in therapeutic group addressing interpersonal concerns.  Short Term Goals: Engage patient in aftercare planning with referrals and resources, Increase social support, Increase emotional regulation, Facilitate acceptance of mental health diagnosis and concerns, Identify triggers associated with mental health/substance abuse issues, and Increase skills for wellness and recovery  Therapeutic Interventions: Assess for all  discharge needs, 1 to 1 time with Social worker, Explore available resources and support systems, Assess for adequacy in community support network, Educate family and significant other(s) on suicide prevention, Complete Psychosocial Assessment, Interpersonal group therapy.  Evaluation of Outcomes: Progressing   Progress in Treatment: Attending groups: No. Participating in groups: No. Taking medication as prescribed: Yes. Toleration medication: Yes. Family/Significant other contact made: Yes, individual(s) contacted:   [name] (BF)- [phone] Patient understands diagnosis: Yes. Discussing patient identified problems/goals with staff: Yes. Medical problems stabilized or resolved: Yes. Denies suicidal/homicidal ideation: Yes. Issues/concerns per patient self-inventory: No.  New problem(s) identified: No, Describe:  none reported  New Short Term/Long Term Goal(s): medication stabilization, elimination of SI thoughts, development of comprehensive mental wellness plan. .  Patient Goals:  "work on my discharge and find outside follow-up"  Discharge Plan or Barriers: Patient recently admitted. CSW will continue to follow and assess for appropriate referrals and possible discharge planning.    Reason for Continuation of Hospitalization: Anxiety Depression Medication stabilization Suicidal ideation  Estimated Length of Stay: 5-7 days  Last 3 [place] Suicide Severity Risk Score: Flowsheet Row [facility] (Current) [facility] Most recent reading at 04/01/2023  6:09 PM ED from 04/01/2023 in [facility] Department at [facility] Most recent reading at 04/01/2023  1:33 AM ED from 10/25/2022 in [facility] Emergency Department at [facility] Most recent reading at 10/26/2022  2:37 AM  C-SSRS RISK CATEGORY High Risk No Risk No Risk       Last PHQ 2/9 Scores:    04/01/2023    4:47 AM  Depression screen PHQ  2/9  Decreased Interest 0  Down, Depressed, Hopeless 1  PHQ - 2 Score 1  Altered sleeping 2  Tired, decreased energy 1  Change in appetite 0  Feeling bad or failure about yourself  2  Trouble concentrating 1  Moving slowly or fidgety/restless 1  Suicidal thoughts 1  PHQ-9 Score 9  Difficult doing work/chores Somewhat difficult    Scribe for Treatment Team: Izell Delmar, Alexander Mt 04/04/2023 12:41 PM

## 2023-04-04 NOTE — [facility] Group Notes (Signed)
[facility] Group Notes:  (Nursing/MHT/Case Management/Adjunct)  Date:  04/04/2023  Time:  10:49 PM  Type of Therapy:  Group Therapy  Participation Level:  Did Not Attend  Participation Quality:   n/a  Affect:   n/a  Cognitive:   n/a  Insight:  None  Engagement in Group:   n/a  Modes of Intervention:   n/a  Summary of Progress/Problems:  Isaish Alemu E Patsey Pitstick 04/04/2023, 10:49 PM

## 2023-04-04 NOTE — Group Note (Signed)
Date:  04/04/2023 Time:  9:41 AM  Group Topic/Focus:  Goals Group:   The focus of this group is to help patients establish daily goals to achieve during treatment and discuss how the patient can incorporate goal setting into their daily lives to aide in recovery. Orientation:   The focus of this group is to educate the patient on the purpose and policies of crisis stabilization and provide a format to answer questions about their admission.  The group details unit policies and expectations of patients while admitted.    Participation Level:  Did Not Attend  Additional Comments:  Patient was encouraged to attend group multiple times.  Kierre Hintz T Lorraine Lax 04/04/2023, 9:41 AM

## 2023-04-05 DIAGNOSIS — F332 Major depressive disorder, recurrent severe without psychotic features: Secondary | ICD-10-CM | POA: Diagnosis not present

## 2023-04-05 MED ORDER — NICOTINE POLACRILEX 2 MG MT GUM: 2.0000 mg | CHEWING_GUM | OROMUCOSAL | 0 refills | Status: DC | PRN

## 2023-04-05 MED ORDER — SERTRALINE HCL 100 MG PO TABS: 100.0000 mg | ORAL_TABLET | Freq: Every day | ORAL | 0 refills | Status: DC

## 2023-04-05 MED ORDER — NICOTINE 21 MG/24HR TD PT24: 21.0000 mg | MEDICATED_PATCH | Freq: Every day | TRANSDERMAL | 0 refills | Status: DC | PRN

## 2023-04-05 MED ORDER — HYDROXYZINE HCL 25 MG PO TABS: 25.0000 mg | ORAL_TABLET | Freq: Three times a day (TID) | ORAL | 0 refills | Status: DC | PRN

## 2023-04-05 MED ORDER — BACITRACIN-NEOMYCIN-POLYMYXIN OINTMENT TUBE: 1.0000 | TOPICAL_OINTMENT | Freq: Two times a day (BID) | CUTANEOUS | Status: DC

## 2023-04-05 MED ORDER — TRAZODONE HCL 50 MG PO TABS: 50.0000 mg | ORAL_TABLET | Freq: Every evening | ORAL | 0 refills | Status: DC | PRN

## 2023-04-05 MED ORDER — IBUPROFEN 200 MG PO TABS: 200.0000 mg | ORAL_TABLET | Freq: Four times a day (QID) | ORAL | Status: DC | PRN

## 2023-04-05 NOTE — [facility] Group Notes (Signed)
[facility] Group Notes:  (Nursing/MHT/Case Management/Adjunct)  Date:  04/05/2023  Time:  9:20 AM  Type of Therapy:  Group Topic/ Focus: Goals Group: The focus of this group is to help patients establish daily goals to achieve during treatment and discuss how the patient can incorporate goal setting into their daily lives to aide in recovery.   Participation Level:  Did Not Attend  Summary of Progress/Problems:  Patient did not attend goals group today. Patient was encouraged but refused.   Osvaldo Human R Kaylah Chiasson 04/05/2023, 9:20 AM

## 2023-04-05 NOTE — Progress Notes (Incomplete)
[facility] MD Progress Note  04/05/2023 6:40 AM [name]  MRN:  [id number]  Principal Problem: Major depressive disorder, recurrent episode, severe (HCC) Diagnosis: Principal Problem:   Major depressive disorder, recurrent episode, severe (HCC) Active Problems:   Generalized anxiety disorder   Opioid dependence (HCC)   Alcohol use  Reason for Admission:  [name] is a 47 y.o. female  with a past psychiatric history of bipolar disorder, major depressive disorder, sedative dependence, panic disorder with agoraphobia,  Suicide Attempts (x2), Self Injurious Behavior (cutting, none in past 20 years), and multiple prior psychiatric hospitalizations [facility] 2008x2, 2009x2, 2010, 2011, 2012, 2015, 2016). Patient initially arrived to [facility] on 04/01/23 for 2 self-inflected lacerations, and (admitted on 04/01/2023, total  LOS: 4 days ) to [facility]. She is currently here under IVC.   Chart Review from last 24 hours:  The patient's chart was reviewed and nursing notes were reviewed. The patient's case was discussed in multidisciplinary team meeting.   - Overnight events to report per chart review / staff report: no notable overnight events to report - Patient received all scheduled medications - Patient received the following PRN medications: hydroxyzine 25 mg, ibuprofen, trazodone  VSS. Last CIWA 3, 4.  Information Obtained Today During Patient Interview: Patient evaluated {EvaluatedLocation:30461}. Reports sleep ***. Reports appetite ***. States mood is "***" today. {Moodreports:30463:s}. Side effects to currently prescribed medications are ***. {SomaticComplaints:30465}. Reports physical complaints are ***.    On interview, suicidal ideations are {Symptompresence:30462} . Homicidal ideations are {Symptompresence:30462}.  There are no {FirstRankSymptoms:30464}.   Reports goals for today include ***.   *** The patient was seen and evaluated on the unit. On assessment today the patient reports  improving depression and anxiety. Reports that cutting was away of her releasing her frustrations and anxious energy. Denies any current SI, HI or AVH. Desires home discharge with boyfriend [name]. States that he could pick her up and watch her.   Patient endorses fair sleep; endorses good appetite.  Patient does not endorse any side-effects they attribute to medications.  Collateral Information:  [name] boyfriend: [phone], this call was conducted with the assistance of medical student, I was present throughout the conversation and available to assist   Per report, [name] feels safe with Ginessa returning home. Denies any current weapons or guns in the home. Will be able to monitor the patient and is ready for her to come home once discharged. Angelique Blonder any safety concerns currently.    Past Psychiatric History:  Current Psychiatrist: None Current Therapist: None Previous Psychiatric Diagnoses: Bipolar disorder, GAD, Borderline Personality Disorder, MDD  Current psychiatric medications: percocet, trazodone Psychiatric medication history/compliance: Reports quit taking everything all at once. History of taking Zoloft, Xanax, Seroquel, Ambien, lamictal, propranolol. Risperidone - made back of throat clog up.  Psychiatric Hospitalization hx: [facility] 2008x2, 2009x2, 2010, 2011, 2012, 2015, 2016) Psychotherapy hx: none Neuromodulation history: none  History of suicide (obtained from HPI): Twice in the past per chart review (patient denied).  History of homicide or aggression (obtained in HPI): Denies   Past Medical History:  Past Medical History:  Diagnosis Date   Anxiety    Anxiety    Bipolar 1 disorder (HCC)    Bronchitis    history of   Chronic bronchitis (HCC)    Cigarette smoker    Colitis    Depression    Depression    Endometriosis    GERD (gastroesophageal reflux disease)    uses otc acid reducer   Headache(784.0)  Hyperlipidemia    Renal disorder    kidney stones   UTI  (lower urinary tract infection)     Family Psychiatric History:  Psychiatric Dx: Mom - panic attacks. Aunt - depression, anxiety.  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  Hotel manager: denies   Access to firearms: denies   Current Medications: Current Facility-Administered Medications  Medication Dose Route Frequency Provider Last Rate Last Admin   acetaminophen (TYLENOL) tablet 650 mg  650 mg Oral Q6H PRN Onuoha, Josephine C, NP       albuterol (PROVENTIL) (2.5 MG/3ML) 0.083% nebulizer solution 2.5 mg  2.5 mg Nebulization Q6H PRN Massengill, Harrold Donath, MD       alum & mag hydroxide-simeth (MAALOX/MYLANTA) 200-200-20 MG/5ML suspension 30 mL  30 mL Oral Q4H PRN Welford Roche, Josephine C, NP       cloNIDine (CATAPRES) tablet 0.1 mg  0.1 mg Oral Q6H PRN Karie Fetch, MD       diphenhydrAMINE (BENADRYL) capsule 50 mg  50 mg Oral TID PRN Dahlia Byes C, NP       Or   diphenhydrAMINE (BENADRYL) injection 50 mg  50 mg Intramuscular TID PRN Dahlia Byes C, NP       folic acid (FOLVITE) tablet 1 mg  1 mg Oral Daily Onuoha, Josephine C, NP   1 mg at 04/04/23 0900   haloperidol (HALDOL) tablet 5 mg  5 mg Oral TID PRN Dahlia Byes C, NP       Or   haloperidol lactate (HALDOL) injection 5 mg  5 mg Intramuscular TID PRN Dahlia Byes C, NP       hydrOXYzine (ATARAX) tablet 25 mg  25 mg Oral TID PRN Karie Fetch, MD   25 mg at 04/04/23 2104   ibuprofen (ADVIL) tablet 200 mg  200 mg Oral Q6H PRN Karie Fetch, MD   200 mg at 04/04/23 1710   loperamide (IMODIUM) capsule 4 mg  4 mg Oral Q6H PRN Karie Fetch, MD   4 mg at 04/02/23 1449   LORazepam (ATIVAN) tablet 1 mg  1 mg Oral Q6H PRN Karie Fetch, MD       multivitamin with minerals tablet 1 tablet   1 tablet Oral Daily Dahlia Byes C, NP   1 tablet at 04/04/23 0900   neomycin-bacitracin-polymyxin (NEOSPORIN) ointment 1 Application  1 Application Topical BID Otho Bellows, RPH   1 Application at 04/04/23 1707   nicotine (NICODERM CQ - dosed in mg/24 hours) patch 21 mg  21 mg Transdermal Daily PRN Karie Fetch, MD       nicotine polacrilex (NICORETTE) gum 2 mg  2 mg Oral Q4H PRN Karie Fetch, MD       pantoprazole (PROTONIX) EC tablet 40 mg  40 mg Oral Daily Onuoha, Josephine C, NP   40 mg at 04/04/23 0900   sertraline (ZOLOFT) tablet 100 mg  100 mg Oral Daily Onuoha, Josephine C, NP   100 mg at 04/04/23 0936   thiamine (Vitamin B-1) tablet 100 mg  100 mg Oral Daily Onuoha, Josephine C, NP   100 mg at 04/04/23 0900   Or   thiamine (VITAMIN B1) injection 100 mg  100 mg Intravenous Daily Dahlia Byes C, NP       traZODone (DESYREL) tablet 50 mg  50 mg Oral QHS  PRN Jasper Riling, NP   50 mg at 04/04/23 2104    Lab Results:  Results for orders placed or performed during the hospital encounter of 04/01/23 (from the past 48 hour(s))  TSH     Status: None   Collection Time: 04/03/23  6:55 AM  Result Value Ref Range   TSH 1.848 0.350 - 4.500 uIU/mL    Comment: Performed by a 3rd Generation assay with a functional sensitivity of <=0.01 uIU/mL. Performed at [facility], [address] [place], [place] [address]   Lipid panel     Status: None   Collection Time: 04/03/23  6:55 AM  Result Value Ref Range   Cholesterol 161 0 - 200 mg/dL   Triglycerides 66 <742 mg/dL   HDL 56 >59 mg/dL   Total CHOL/HDL Ratio 2.9 RATIO   VLDL 13 0 - 40 mg/dL   LDL Cholesterol 92 0 - 99 mg/dL    Comment:        Total Cholesterol/HDL:CHD Risk Coronary Heart Disease Risk Table                     Men   Women  1/2 Average Risk   3.4   3.3  Average Risk       5.0   4.4  2 X Average Risk   9.6   7.1  3 X Average Risk  23.4   11.0        Use the calculated Patient  Ratio above and the CHD Risk Table to determine the patient's CHD Risk.        ATP III CLASSIFICATION (LDL):  <100     mg/dL   Optimal  563-875  mg/dL   Near or Above                    Optimal  130-159  mg/dL   Borderline  643-329  mg/dL   High  >518     mg/dL   Very High Performed at [facility], [address], [place], [place] [address]   Hemoglobin A1c     Status: None   Collection Time: 04/03/23  6:55 AM  Result Value Ref Range   Hgb A1c MFr Bld 5.2 4.8 - 5.6 %    Comment: (NOTE)         Prediabetes: 5.7 - 6.4         Diabetes: >6.4         Glycemic control for adults with diabetes: <7.0    Mean Plasma Glucose 103 mg/dL    Comment: (NOTE) Performed At: [facility] [address] [place], [place] [id number] Jolene Schimke MD XN:[id number]     Blood Alcohol level:  Lab Results  Component Value Date   ETH 247 (H) 04/01/2023   ETH 134 (H) 04/02/2017    Metabolic Labs: Lab Results  Component Value Date   HGBA1C 5.2 04/03/2023   MPG 103 04/03/2023   MPG 105 12/03/2014   No results found for: "PROLACTIN" Lab Results  Component Value Date   CHOL 161 04/03/2023   TRIG 66 04/03/2023   HDL 56 04/03/2023   CHOLHDL 2.9 04/03/2023   VLDL 13 04/03/2023   LDLCALC 92 04/03/2023   LDLCALC 82 12/03/2014    Physical Findings: AIMS: No  CIWA:  CIWA-Ar Total: 4     Psychiatric Specialty Exam: General Appearance:  Appropriate for Environment; Casual   Eye Contact:  Fair  Speech:  Clear and Coherent   Volume:  Normal   Mood:  Euthymic   Affect:  Blunt; Other (comment) (guarded)   Thought Content:  Logical   Suicidal Thoughts:  Suicidal Thoughts: No   Homicidal Thoughts:  Homicidal Thoughts: No   Thought Process:  Coherent   Orientation:  Full (Time, Place and Person)     Memory:  Immediate Fair; Recent Fair   Judgment:  Intact   Insight:  Lacking   Concentration:  Fair   Recall:  [employer] of  Knowledge:  Fair   Language:  Fair   Psychomotor Activity:  Psychomotor Activity: Normal   Assets:  Manufacturing systems engineer; Desire for Improvement; Housing; Intimacy; Social Support   Sleep:  Sleep: Good    Review of Systems Review of Systems  Constitutional:  Negative for chills and fever.  Respiratory:  Negative for cough and shortness of breath.   Cardiovascular:  Negative for chest pain.  Gastrointestinal:  Negative for diarrhea, nausea and vomiting.  Musculoskeletal:  Negative for myalgias.  Neurological:  Negative for tremors and headaches.  Psychiatric/Behavioral:  Negative for depression, hallucinations, memory loss, substance abuse and suicidal ideas. The patient is not nervous/anxious and does not have insomnia.     Vital Signs: Blood pressure (!) 124/55, pulse 94, temperature 98.4 F (36.9 C), temperature source Oral, resp. rate 19, height 5\' 3"  (1.6 m), weight 76.2 kg, SpO2 97%. Body mass index is 29.76 kg/m. Physical Exam Constitutional:      General: She is not in acute distress.    Appearance: Normal appearance. She is not toxic-appearing or diaphoretic.  Pulmonary:     Effort: Pulmonary effort is normal.  Neurological:     Mental Status: She is alert.  Psychiatric:        Attention and Perception: Attention and perception normal.        Mood and Affect: Mood is not anxious or depressed. Affect is not flat.        Speech: Speech normal. She is communicative.        Behavior: Behavior normal. Behavior is not combative. Behavior is cooperative.        Thought Content: Thought content is not paranoid or delusional. Thought content does not include homicidal or suicidal ideation. Thought content does not include homicidal or suicidal plan.     Comments: Guarded intermittently    Assets  Assets: Communication Skills; Desire for Improvement; Housing; Intimacy; Social Support   Treatment Plan Summary: Daily contact with patient to assess and evaluate  symptoms and progress in treatment and Medication management  ASSESSMENT:  [name] is a 47 y.o. female  with a past psychiatric history of bipolar disorder, major depressive disorder, sedative dependence, panic disorder with agoraphobia. Patient initially arrived to [facility] on 04/01/23 for 2 self-inflected lacerations, and admitted to [facility] under IVC on 04/02/23 for acute suicidal or self-harming behaviors. PPHx is significant for bipolar disorder, and history of Suicide Attempts (x2), Self Injurious Behavior (cutting, none in past 20 years), and multiple prior psychiatric hospitalizations [facility] 2008x2, 2009x2, 2010, 2011, 2012, 2015, 2016) . PMHx is significant for colitis, GERD, chronic bronchitis, hyperlipidemia.    CIWAs downtrending. Denying withdrawal symptoms. Continues to minimize depressive symptoms. Discussed safety planning with boyfriend who states she can come home once discharged. Anticipate home discharge tomorrow or Wednesday and will attempt to set up follow-up appointments.   Diagnoses / Active Problems: Major depressive disorder, recurrent episode, severe (HCC) Principal Problem:   Major  depressive disorder, recurrent episode, severe (HCC) Active Problems:   Generalized anxiety disorder   Opioid dependence (HCC)   Alcohol use  PLAN: Safety and Monitoring:  -- Involuntary admission to inpatient psychiatric unit for safety, stabilization and treatment  -- Daily contact with patient to assess and evaluate symptoms and progress in treatment  -- Patient's case to be discussed in multi-disciplinary team meeting  -- Observation Level : q15 minute checks  -- Vital signs:  q12 hours  -- Precautions: suicide, elopement, and assault  2. Interventions (medications, psychoeducation, etc):               -- Continue Sertraline 100 mg daily for depression and anxiety, was started in the ED and monitor for mania   -- Discussed the utility of starting a mood stabilizer, patient declined  trial of medications at this time, had bad side effects w/ Lamictal, Risperdal and Abilify in the past                 -- CIWA protocol w/ prn ativan for alcohol withdrawal   -- Last CIWA was 4 and hasn't required ativan last 48 hours               -- medical regimen:  GERD:  -Protonix EC 40 mg daily Alcohol Use Disorder:  -multivitamin and thiamine for alcohol   -- Patient does not need nicotine replacement  PRN medications for symptomatic management:              -- continue acetaminophen 650 mg every 6 hours as needed for mild to moderate pain, fever, and headaches              -- continue hydroxyzine 25 mg three times a day as needed for anxiety              -- continue aluminum-magnesium hydroxide + simethicone 30 mL every 4 hours as needed for heartburn or indigestion  -- As needed agitation protocol in-place  The risks/benefits/side-effects/alternatives to the above medication were discussed in detail with the patient and time was given for questions. The patient consents to medication trial. FDA black box warnings, if present, were discussed.  The patient is agreeable with the medication plan, as above. We will monitor the patient's response to pharmacologic treatment, and adjust medications as necessary.  3. Routine and other pertinent labs:             -- Metabolic profile:  BMI: Body mass index is 29.76 kg/m.  Prolactin: No results found for: "PROLACTIN"  Lipid Panel: Lab Results  Component Value Date   CHOL 161 04/03/2023   TRIG 66 04/03/2023   HDL 56 04/03/2023   CHOLHDL 2.9 04/03/2023   VLDL 13 04/03/2023   LDLCALC 92 04/03/2023   LDLCALC 82 12/03/2014    HbgA1c: Hgb A1c MFr Bld (%)  Date Value  04/03/2023 5.2    TSH: TSH (uIU/mL)  Date Value  04/03/2023 1.848  01/03/2014 1.310    EKG monitoring: Sinus Tach HR 107, low voltage pre-cordial leads QTc: 483  4. Group Therapy:  -- Encouraged patient to participate in unit milieu and in scheduled group  therapies   -- Short Term Goals: Ability to identify changes in lifestyle to reduce recurrence of condition, verbalize feelings, identify and develop effective coping behaviors, maintain clinical measurements within normal limits, and identify triggers associated with substance abuse/mental health issues will improve. Improvement in ability to disclose and discuss suicidal ideas, demonstrate self-control, and comply  with prescribed medications.  -- Long Term Goals: Improvement in symptoms so as ready for discharge -- Patient is encouraged to participate in group therapy while admitted to the psychiatric unit. -- We will address other chronic and acute stressors, which contributed to the patient's Major depressive disorder, recurrent episode, severe (HCC) in order to reduce the risk of self-harm at discharge.  5. Discharge Planning:   -- Social work and case management to assist with discharge planning and identification of hospital follow-up needs prior to discharge  -- Estimated LOS: 1-2 days  -- Discharge Concerns: Need to establish a safety plan; Medication compliance and effectiveness  -- Discharge Goals: Return home with outpatient referrals for mental health follow-up including medication management/psychotherapy  I certify that inpatient services furnished can reasonably be expected to improve the patient's condition.   Signed: Karie Fetch, MD 04/05/2023, 6:40 AM

## 2023-04-05 NOTE — Group Note (Signed)
Date:  04/05/2023 Time:  1:24 AM  Group Topic/Focus:  Wrap-Up Group:   The focus of this group is to help patients review their daily goal of treatment and discuss progress on daily workbooks.    Participation Level:  Did Not Attend  Participation Quality:   N/A  Affect:   N/A  Cognitive:   n/a  Insight: None  Engagement in Group:  None  Modes of Intervention:  Activity and Socialization  Additional Comments: The patient did not attend group.   [name] 04/05/2023, 1:24 AM

## 2023-04-05 NOTE — Discharge Instructions (Signed)
-  Follow-up with your outpatient psychiatric provider -instructions on appointment date, time, and address (location) are provided to you in discharge paperwork.  -Take your psychiatric medications as prescribed at discharge - instructions are provided to you in the discharge paperwork  -Follow-up with outpatient primary care doctor and other specialists -for management of preventative medicine and any chronic medical disease.  -Recommend abstinence from alcohol, tobacco, and other illicit drug use at discharge.   -If your psychiatric symptoms recur, worsen, or if you have side effects to your psychiatric medications, call your outpatient psychiatric provider, 911, [phone] or go to the nearest emergency department.  -If suicidal thoughts occur, call your outpatient psychiatric provider, 911, [phone] or go to the nearest emergency department.

## 2023-04-05 NOTE — Progress Notes (Signed)
  [facility] Adult Case Management Discharge Plan :  Will you be returning to the same living situation after discharge:  Yes,  [name] (BF)- [phone] At discharge, do you have transportation home?: Yes,  [name] (BF)- [phone] Do you have the ability to pay for your medications: No. [employer]  Release of information consent forms completed and in the chart;  Patient's signature needed at discharge.  Patient to Follow up at:  Follow-up Information     [facility]. Go to.   Specialty: Behavioral Health Why: Please go to this provider for an assessment, to obtain therapy and medication management services.  For fastest service, please go Monday through Friday, arrive by 7:00 am for same day service. Contact information: [phone] [place] [address] [phone]                Next level of care provider has access to [facility] Link:yes  Safety Planning and Suicide Prevention discussed: Yes,  [name] (BF)- [phone]     Has patient been referred to the Quitline?: Patient refused referral for treatment  Patient has been referred for addiction treatment: Yes, referral information given but appointment not made [facility] (list facility). Patient to continue working towards treatment goals after discharge. Patient no longer meets criteria for inpatient criteria per attending physician. Continue taking medications as prescribed, nursing to provide instructions at discharge. Follow up with all scheduled appointments.   Misti Towle S Ellarose Titiana, LCSW 04/05/2023, 9:23 AM

## 2023-04-05 NOTE — Progress Notes (Signed)
   04/04/23 2343  Psych Admission Type (Psych Patients Only)  Admission Status Involuntary  Psychosocial Assessment  Patient Complaints Anxiety  Eye Contact Fair  Facial Expression Anxious  Affect Appropriate to circumstance  Speech Logical/coherent  Interaction Assertive  Motor Activity Slow  Appearance/Hygiene Unremarkable;In scrubs  Behavior Characteristics Cooperative  Mood Anxious  Thought Process  Coherency WDL  Content WDL  Delusions WDL  Perception WDL  Hallucination None reported or observed  Judgment Poor  Confusion None  Danger to Self  Current suicidal ideation? Denies  Agreement Not to Harm Self Yes  Description of Agreement verbal  Danger to Others  Danger to Others None reported or observed
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
## 2023-04-05 NOTE — Progress Notes (Signed)
Order received for pt discharge. Pt denies any SI HI or AV Hallucinations. Crisis services reviewed with patient at length including [phone], 911 and [phone]. Pt verbalized understanding of follow up care plan. No signs of acute decompensation. All belongings returned and patient escorted from unit to lobby to the care of family.

## 2023-04-05 NOTE — BHH Suicide Risk Assessment (Signed)
[facility] Discharge Suicide Risk Assessment   Principal Problem: Major depressive disorder, recurrent episode, severe (HCC) Discharge Diagnoses: Principal Problem:   Major depressive disorder, recurrent episode, severe (HCC) Active Problems:   Generalized anxiety disorder   Opioid dependence (HCC)   Alcohol use  Reason for admission:  [name] is a 47 y.o. female  with a past psychiatric history of bipolar disorder, major depressive disorder, sedative dependence, panic disorder with agoraphobia,  Suicide Attempts (x2), Self Injurious Behavior (cutting, none in past 20 years), and multiple prior psychiatric hospitalizations [facility] 2008x2, 2009x2, 2010, 2011, 2012, 2015, 2016). Patient initially arrived to [facility] on 04/01/23 for 2 self-inflected lacerations, and (admitted on 04/01/2023, total  LOS: 4 days ) to [facility]. She is currently here under IVC.   PTA Medications: percocet  Hospital Course:   During the patient's hospitalization, patient had extensive initial psychiatric evaluation, and follow-up psychiatric evaluations every day.  Psychiatric diagnoses provided upon initial assessment:  -Major Depressive Disorder, recurrent episode, severe (r/o bipolar II) -Generalized anxiety disorder with panic attacks -Opioid dependence -Alcohol use   Patient's psychiatric medications were adjusted on admission:  -- Continue zoloft 100mg  for depression and anxiety (started in the ED), will monitor for mania  -- Continue CIWA with PRN ativan for alcohol withdrawal  -- Continue thiamine and MV for alcohol use disorder   During the hospitalization, other adjustments were made to the patient's psychiatric medication regimen:  -PRN ativan scheduled changed due to minimal withdrawal symptoms, downtrending CIWAs  Patient's care was discussed during the interdisciplinary team meeting every day during the hospitalization.  The patient denied having side effects to prescribed psychiatric  medication.  Gradually, patient started adjusting to milieu. The patient was evaluated each day by a clinical provider to ascertain response to treatment. Improvement was noted by the patient's report of decreasing symptoms, improved sleep and appetite, affect, medication tolerance, behavior, and participation in unit programming.  Patient was asked each day to complete a self inventory noting mood, mental status, pain, new symptoms, anxiety and concerns.    Symptoms were reported as significantly decreased or resolved completely by discharge.   On day of discharge, the patient reports that their mood is stable. The patient denied having suicidal thoughts for more than 48 hours prior to discharge.  Patient denies having homicidal thoughts.  Patient denies having auditory hallucinations.  Patient denies any visual hallucinations or other symptoms of psychosis. The patient was motivated to continue taking medication with a goal of continued improvement in mental health.   The patient reports their target psychiatric symptoms of anxiety responded well to the psychiatric medications, and the patient reports overall benefit other psychiatric hospitalization. Supportive psychotherapy was provided to the patient. The patient also participated in regular group therapy while hospitalized. Coping skills, problem solving as well as relaxation therapies were also part of the unit programming.  Labs were reviewed with the patient, and abnormal results were discussed with the patient.  The patient is able to verbalize their individual safety plan to this provider.  Behavioral Events: none  Sleep  Sleep:Sleep: Good   Musculoskeletal: Strength & Muscle Tone: within normal limits Gait & Station: normal Patient leans: N/A  Psychiatric Specialty Exam  Presentation  General Appearance:  Appropriate for Environment; Casual  Eye Contact: Fair  Speech: Clear and Coherent  Speech  Volume: Normal  Handedness: -- (not assessed)   Mood and Affect  Mood: Euthymic  Affect: Blunt; Other (comment) (guarded)   Thought Process  Thought Processes:  Coherent  Descriptions of Associations: Intact  Orientation: Full (Time, Place and Person)  Thought Content: Logical  History of Schizophrenia/Schizoaffective disorder: No  Duration of Psychotic Symptoms:N/A Hallucinations: Hallucinations: None  Ideas of Reference: None  Suicidal Thoughts: Suicidal Thoughts: No  Homicidal Thoughts: Homicidal Thoughts: No   Sensorium  Memory: Immediate Fair; Recent Fair  Judgment: Intact  Insight: Lacking   Executive Functions  Concentration: Fair  Attention Span: Fair  Recall: [name] of Knowledge: Fair  Language: Fair   Psychomotor Activity  Psychomotor Activity: Psychomotor Activity: Normal   Assets  Assets: Communication Skills; Desire for Improvement; Housing; Intimacy; Social Support   Sleep  Sleep: Sleep: Good    Assets  Assets: Manufacturing systems engineer; Desire for Improvement; Housing; Intimacy; Social Support   Physical Exam Constitutional:      Appearance: Normal appearance.  Neurological:     General: No focal deficit present.     Mental Status: She is alert and oriented to person, place, and time.  Psychiatric:        Attention and Perception: She does not perceive auditory or visual hallucinations.        Mood and Affect: Mood normal. Mood is not anxious or depressed.        Speech: Speech normal.        Behavior: Behavior is not withdrawn. Behavior is cooperative.        Thought Content: Thought content is not paranoid or delusional. Thought content does not include homicidal or suicidal ideation. Thought content does not include homicidal or suicidal plan.    Review of Systems  Constitutional:  Negative for chills and fever.  Respiratory:  Negative for cough.   Cardiovascular:  Negative for chest pain.   Gastrointestinal:  Negative for abdominal pain, nausea and vomiting.  Neurological:  Negative for tremors, weakness and headaches.  Psychiatric/Behavioral:  Negative for depression, hallucinations, memory loss, substance abuse and suicidal ideas. The patient is not nervous/anxious and does not have insomnia.    Physical Exam Constitutional:      Appearance: the patient is not toxic-appearing.  Pulmonary:     Effort: Pulmonary effort is normal.  Neurological:     General: No focal deficit present.     Mental Status: the patient is alert and oriented to person, place, and time.   Review of Systems  Respiratory:  Negative for shortness of breath.   Cardiovascular:  Negative for chest pain.  Gastrointestinal:  Negative for abdominal pain, constipation, diarrhea, nausea and vomiting.  Neurological:  Negative for headaches.    Blood pressure (!) 124/55, pulse 94, temperature 98.4 F (36.9 C), temperature source Oral, resp. rate 19, height 5\' 3"  (1.6 m), weight 76.2 kg, SpO2 97%. Body mass index is 29.76 kg/m.  Mental Status Per Nursing Assessment::   On Admission:  Suicidal ideation indicated by patient  Demographic Factors:  Caucasian, Low socioeconomic status, and Unemployed  Loss Factors: NA  Historical Factors: Prior suicide attempts and Impulsivity  Risk Reduction Factors:   Living with another person, especially a relative and Positive social support  Continued Clinical Symptoms:  Severe Anxiety and/or Agitation Depression:   Comorbid alcohol abuse/dependence Impulsivity Alcohol/Substance Abuse/Dependencies Previous Psychiatric Diagnoses and Treatments  Cognitive Features That Contribute To Risk:  Loss of executive function    Suicide Risk:  Mild:  Suicidal ideation of limited frequency, intensity, duration, and specificity.  There are no identifiable plans, no associated intent, mild dysphoria and related symptoms, good self-control (both objective and subjective  assessment),  few other risk factors, and identifiable protective factors, including available and accessible social support.   Follow-up Information     [facility]. Go to.   Specialty: Behavioral Health Why: Please go to this provider for an assessment, to obtain therapy and medication management services.  For fastest service, please go Monday through Friday, arrive by 7:00 am for same day service. Contact information: [phone] [place] [address] [phone]                Discharge recommendations:    Activity: as tolerated  Diet: heart healthy  # It is recommended to the patient to continue psychiatric medications as prescribed, after discharge from the hospital.     # It is recommended to the patient to follow up with your outpatient psychiatric provider -instructions on appointment date, time, and address (location) are provided to you in discharge paperwork  # Follow-up with outpatient primary care doctor and other specialists -for management of chronic medical disease, including:  -follow-up for removal of stitches  -GERD  # Testing: Follow-up with outpatient provider for abnormal lab results:  -Na 132 -AST 72 / ALT 37   # It was discussed with the patient, the impact of alcohol, drugs, tobacco have been there overall psychiatric and medical wellbeing, and total abstinence from substance use was recommended to the patient.   # Prescriptions provided or sent directly to preferred pharmacy at discharge. Patient agreeable to plan. Given opportunity to ask questions. Appears to feel comfortable with discharge.    # In the event of worsening symptoms, the patient is instructed to call the crisis hotline, 911 and or go to the nearest ED for appropriate evaluation and treatment of symptoms. To follow-up with primary care provider for other medical issues, concerns and or health care needs  Patient agrees with D/C instructions  and plan.   Total Time Spent in Direct Patient Care:  I personally spent 20 minutes on the unit in direct patient care. The direct patient care time included face-to-face time with the patient, reviewing the patient's chart, communicating with other professionals, and coordinating care. Greater than 50% of this time was spent in counseling or coordinating care with the patient regarding goals of hospitalization, psycho-education, and discharge planning needs.   Peterson Ao, MD, PGY-1 04/05/2023, 9:46 AM

## 2023-09-26 ENCOUNTER — Emergency Department (HOSPITAL_COMMUNITY)
Admission: EM | Admit: 2023-09-26 | Discharge: 2023-09-27 | Disposition: A | Discharge status: Home / Self Care | Payer: MEDICAID | Attending: Emergency Medicine | Admitting: Emergency Medicine

## 2023-09-26 ENCOUNTER — Other Ambulatory Visit: Payer: Self-pay

## 2023-09-26 ENCOUNTER — Encounter (HOSPITAL_COMMUNITY): Payer: Self-pay

## 2023-09-26 DIAGNOSIS — M549 Dorsalgia, unspecified: Secondary | ICD-10-CM | POA: Insufficient documentation

## 2023-09-26 DIAGNOSIS — R059 Cough, unspecified: Secondary | ICD-10-CM | POA: Insufficient documentation

## 2023-09-26 DIAGNOSIS — F17210 Nicotine dependence, cigarettes, uncomplicated: Secondary | ICD-10-CM | POA: Insufficient documentation

## 2023-09-26 DIAGNOSIS — R079 Chest pain, unspecified: Secondary | ICD-10-CM | POA: Insufficient documentation

## 2023-09-26 NOTE — ED Triage Notes (Signed)
 Pt BIB [facility] from home d/t CP sudden onset while watching TV.  Has neck and back pain on same left side.  EMS gave1 Nirto and 324 ASA PO.  Pain increases in with breathing. Denies N/V/SOB - has had intermittent cough

## 2023-09-27 ENCOUNTER — Emergency Department (HOSPITAL_COMMUNITY): Payer: MEDICAID

## 2023-09-27 LAB — CBC
HCT: 39.8 % (ref 36.0–46.0)
Hemoglobin: 13.1 g/dL (ref 12.0–15.0)
MCH: 29.6 pg (ref 26.0–34.0)
MCHC: 32.9 g/dL (ref 30.0–36.0)
MCV: 89.8 fL (ref 80.0–100.0)
Platelets: 297 10*3/uL (ref 150–400)
RBC: 4.43 MIL/uL (ref 3.87–5.11)
RDW: 15.3 % (ref 11.5–15.5)
WBC: 7.1 10*3/uL (ref 4.0–10.5)
nRBC: 0 % (ref 0.0–0.2)

## 2023-09-27 LAB — D-DIMER, QUANTITATIVE: D-Dimer, Quant: 1.04 ug{FEU}/mL — ABNORMAL HIGH (ref 0.00–0.50)

## 2023-09-27 LAB — BASIC METABOLIC PANEL
Anion gap: 10 (ref 5–15)
BUN: 7 mg/dL (ref 6–20)
CO2: 24 mmol/L (ref 22–32)
Calcium: 8.4 mg/dL — ABNORMAL LOW (ref 8.9–10.3)
Chloride: 106 mmol/L (ref 98–111)
Creatinine, Ser: 0.66 mg/dL (ref 0.44–1.00)
GFR, Estimated: >60 mL/min (ref >60)
Glucose, Bld: 87 mg/dL (ref 70–99)
Potassium: 3.6 mmol/L (ref 3.5–5.1)
Sodium: 140 mmol/L (ref 135–145)

## 2023-09-27 LAB — TROPONIN I (HIGH SENSITIVITY)
Troponin I (High Sensitivity): <2 ng/L (ref <18)
Troponin I (High Sensitivity): <2 ng/L (ref <18)

## 2023-09-27 LAB — HCG, SERUM, QUALITATIVE: Preg, Serum: NEGATIVE

## 2023-09-27 MED ORDER — OMEPRAZOLE 40 MG PO CPDR: 40.0000 mg | DELAYED_RELEASE_CAPSULE | Freq: Every day | ORAL | 3 refills | Status: AC

## 2023-09-27 MED ORDER — HYDROMORPHONE HCL 1 MG/ML IJ SOLN
0.5000 mg | Freq: Once | INTRAMUSCULAR | Status: AC
  Administered 2023-09-27: 0.5 mg via INTRAVENOUS
  Filled 2023-09-27: qty 1

## 2023-09-27 MED ORDER — IOHEXOL 350 MG/ML SOLN
80.0000 mL | Freq: Once | INTRAVENOUS | Status: AC | PRN
  Administered 2023-09-27: 80 mL via INTRAVENOUS

## 2023-09-27 MED ORDER — IPRATROPIUM-ALBUTEROL 0.5-2.5 (3) MG/3ML IN SOLN
3.0000 mL | Freq: Once | RESPIRATORY_TRACT | Status: AC
  Administered 2023-09-27: 3 mL via RESPIRATORY_TRACT
  Filled 2023-09-27: qty 3

## 2023-09-27 MED ORDER — ALUM & MAG HYDROXIDE-SIMETH 200-200-20 MG/5ML PO SUSP
30.0000 mL | Freq: Once | ORAL | Status: AC
  Administered 2023-09-27: 30 mL via ORAL
  Filled 2023-09-27: qty 30

## 2023-09-27 MED ORDER — HYDROXYZINE HCL 25 MG PO TABS
25.0000 mg | ORAL_TABLET | Freq: Once | ORAL | Status: AC
  Administered 2023-09-27: 25 mg via ORAL
  Filled 2023-09-27: qty 1

## 2023-09-27 MED ORDER — LIDOCAINE VISCOUS HCL 2 % MT SOLN
15.0000 mL | Freq: Once | OROMUCOSAL | Status: AC
  Administered 2023-09-27: 15 mL via ORAL
  Filled 2023-09-27: qty 15

## 2023-09-27 NOTE — ED Provider Notes (Signed)
 [place] EMERGENCY DEPARTMENT AT [facility] Provider Note  CSN: [id number] Arrival date & time: 09/26/23 2347  Chief Complaint(s) Chest Pain  HPI [name] is a 48 y.o. female    The history is provided by the patient.  Chest Pain Pain location:  Substernal area Pain quality: stabbing   Pain radiates to:  Lower back Pain severity:  Moderate Onset quality:  Gradual Duration:  4 hours Timing:  Constant Chronicity:  New Associated symptoms: back pain and cough   Associated symptoms: no fever, no nausea, no shortness of breath and no vomiting   Risk factors: smoking   Risk factors: no coronary artery disease, no diabetes mellitus, no high cholesterol, no hypertension, not female and no prior DVT/PE     Past Medical History Past Medical History:  Diagnosis Date   Anxiety    Anxiety    Bipolar 1 disorder (HCC)    Bronchitis    history of   Chronic bronchitis (HCC)    Cigarette smoker    Colitis    Depression    Depression    Endometriosis    GERD (gastroesophageal reflux disease)    uses otc acid reducer   Headache(784.0)    Hyperlipidemia    Renal disorder    kidney stones   UTI (lower urinary tract infection)    Patient Active Problem List   Diagnosis Date Noted   Alcohol use 04/02/2023   Bipolar I disorder, most recent episode depressed (HCC) 04/01/2023   Opioid dependence (HCC) 04/03/2017   HLD (hyperlipidemia) 12/03/2014   Chest pain 12/03/2014   Substance abuse (HCC) 12/03/2014   Hypokalemia 12/03/2014   Pain in the chest    Suicidal ideations    Anxiety    Suicidal behavior    Major depressive disorder, recurrent episode, severe (HCC) 04/08/2014   Suicidal thoughts 04/07/2014   GERD (gastroesophageal reflux disease) 03/18/2014   Vaginal discharge 03/18/2014   Tobacco abuse 03/18/2014   Anxiety state 03/18/2014   Colitis 03/16/2014   Panic attacks 01/11/2014   Generalized anxiety disorder 01/11/2014   MDD (major depressive  disorder), recurrent severe, without psychosis (HCC) 01/10/2014   Pyelonephritis 01/03/2014   Hypotension 12/31/2013   UTI (urinary tract infection) 12/31/2013   Chronic pelvic pain in female 03/15/2011   Home Medication(s) Prior to Admission medications   Medication Sig Start Date End Date Taking? Authorizing Provider  albuterol (PROVENTIL HFA;VENTOLIN HFA) 108 (90 Base) MCG/ACT inhaler Inhale 2 puffs into the lungs every 6 (six) hours as needed for wheezing or shortness of breath.    [provider]  hydrOXYzine (ATARAX) 25 MG tablet Take 1 tablet (25 mg total) by mouth 3 (three) times daily as needed for anxiety. 04/05/23   Peterson Ao, MD  ibuprofen (ADVIL) 200 MG tablet Take 1 tablet (200 mg total) by mouth every 6 (six) hours as needed for mild pain (alternate with tylenol). 04/05/23   Peterson Ao, MD  neomycin-bacitracin-polymyxin (NEOSPORIN) OINT Apply 1 Application topically 2 (two) times daily. 04/05/23   Peterson Ao, MD  nicotine (NICODERM CQ - DOSED IN MG/24 HOURS) 21 mg/24hr patch Place 1 patch (21 mg total) onto the skin daily as needed (nicotine dependence). 04/05/23   Peterson Ao, MD  nicotine polacrilex (NICORETTE) 2 MG gum Take 1 each (2 mg total) by mouth every 4 (four) hours as needed for smoking cessation. 04/05/23   Peterson Ao, MD  omeprazole (PRILOSEC) 40 MG capsule Take 1 capsule (40 mg total) by mouth daily.  10/28/15   Gilda Crease, MD  oxyCODONE-acetaminophen (PERCOCET) 10-325 MG tablet Take 1 tablet by mouth every 6 (six) hours as needed for pain.    [provider]  sertraline (ZOLOFT) 100 MG tablet Take 1 tablet (100 mg total) by mouth daily. 04/05/23   Peterson Ao, MD  traZODone (DESYREL) 50 MG tablet Take 1 tablet (50 mg total) by mouth at bedtime as needed for sleep. 04/05/23   Peterson Ao, MD                                                                                                                                     Allergies Aripiprazole, Risperidone and related, Tramadol, and Lamictal [lamotrigine]  Review of Systems Review of Systems  Constitutional:  Negative for fever.  Respiratory:  Positive for cough. Negative for shortness of breath.   Cardiovascular:  Positive for chest pain.  Gastrointestinal:  Negative for nausea and vomiting.  Musculoskeletal:  Positive for back pain.   As noted in HPI  Physical Exam Vital Signs  I have reviewed the triage vital signs BP (!) 151/90   Pulse (!) 103   Temp 98 F (36.7 C) (Oral)   Resp 13   Ht 5\' 3"  (1.6 m)   Wt 72.6 kg   SpO2 97%   BMI 28.34 kg/m   Physical Exam Vitals reviewed.  Constitutional:      General: She is not in acute distress.    Appearance: She is well-developed. She is not diaphoretic.  HENT:     Head: Normocephalic and atraumatic.     Nose: Nose normal.  Eyes:     General: No scleral icterus.       Right eye: No discharge.        Left eye: No discharge.     Conjunctiva/sclera: Conjunctivae normal.     Pupils: Pupils are equal, round, and reactive to light.  Cardiovascular:     Rate and Rhythm: Normal rate and regular rhythm.     Heart sounds: No murmur heard.    No friction rub. No gallop.  Pulmonary:     Effort: Pulmonary effort is normal. No respiratory distress.     Breath sounds: No stridor. Examination of the right-lower field reveals decreased breath sounds. Examination of the left-lower field reveals decreased breath sounds. Decreased breath sounds present. No rales.  Abdominal:     General: There is no distension.     Palpations: Abdomen is soft.     Tenderness: There is no abdominal tenderness.  Musculoskeletal:        General: No tenderness.     Cervical back: Normal range of motion and neck supple.  Skin:    General: Skin is warm and dry.     Findings: No erythema or rash.  Neurological:     Mental Status: She is alert and oriented to person, place, and time.     ED  Results and  Treatments Labs (all labs ordered are listed, but only abnormal results are displayed) Labs Reviewed  BASIC METABOLIC PANEL - Abnormal; Notable for the following components:      Result Value   Calcium 8.4 (*)    All other components within normal limits  D-DIMER, QUANTITATIVE - Abnormal; Notable for the following components:   D-Dimer, Quant 1.04 (*)    All other components within normal limits  CBC  HCG, SERUM, QUALITATIVE  TROPONIN I (HIGH SENSITIVITY)  TROPONIN I (HIGH SENSITIVITY)                                                                                                                         EKG  EKG Interpretation Date/Time:  Monday September 26 2023 23:59:28 EDT Ventricular Rate:  98 PR Interval:  120 QRS Duration:  74 QT Interval:  387 QTC Calculation: 495 R Axis:   31  Text Interpretation: Sinus rhythm Borderline prolonged QT interval No acute changes Confirmed by Drema Pry [phone]) on 09/27/2023 12:17:42 AM       Radiology CT Angio Chest/Abd/Pel for Dissection W and/or Wo Contrast Result Date: 09/27/2023 CLINICAL DATA:  Acute aortic syndrome suspected. Sudden onset chest pain with neck and back pain on the left EXAM: CT ANGIOGRAPHY CHEST, ABDOMEN AND PELVIS TECHNIQUE: Non-contrast CT of the chest was initially obtained. Multidetector CT imaging through the chest, abdomen and pelvis was performed using the standard protocol during bolus administration of intravenous contrast. Multiplanar reconstructed images and MIPs were obtained and reviewed to evaluate the vascular anatomy. RADIATION DOSE REDUCTION: This exam was performed according to the departmental dose-optimization program which includes automated exposure control, adjustment of the mA and/or kV according to patient size and/or use of iterative reconstruction technique. CONTRAST:  80mL OMNIPAQUE IOHEXOL 350 MG/ML SOLN COMPARISON:  Abdominal CT 05/07/2015 FINDINGS: CTA CHEST FINDINGS Cardiovascular: Preferential  opacification of the thoracic aorta. No evidence of thoracic aortic aneurysm or dissection. Normal heart size. No pericardial effusion. Low-density atheromatous wall thickening of the descending aorta. Mediastinum/Nodes: No mass, adenopathy, or pneumo mediastinum. Lungs/Pleura: There is no edema, consolidation, effusion, or pneumothorax. Generalized airway thickening. 4 mm average diameter pulmonary nodule in the right lower lobe on 9:71. Musculoskeletal: No acute or aggressive finding Review of the MIP images confirms the above findings. CTA ABDOMEN AND PELVIS FINDINGS VASCULAR Aorta: Generalized low-density and calcified atheromatous wall thickening of the aorta. No dissection or aneurysm Celiac: Unremarkable SMA: Unremarkable Renals: Unremarkable IMA: Patent Inflow: No stenosis, dissection, or aneurysm Veins: Unremarkable Review of the MIP images confirms the above findings. NON-VASCULAR Hepatobiliary: Cirrhotic liver morphology with diffuse surface lobulation. No focal lesion is seen in the arterial phase.Cholecystectomy without biliary dilatation Pancreas: Unremarkable. Spleen: Unremarkable. Adrenals/Urinary Tract: Negative adrenals. No hydronephrosis or stone. Unremarkable bladder. Stomach/Bowel:  No obstruction. No visible bowel inflammation Lymphatic: No mass or adenopathy. Reproductive:No pathologic findings. Other: No ascites or pneumoperitoneum. Musculoskeletal: No acute abnormalities. Review of the MIP images confirms the above findings.  IMPRESSION: 1. No acute finding.  No evidence of acute aortic syndrome. 2. Generalized atherosclerosis. 3. Cirrhosis. 4. Generalized airway thickening. 4 mm average diameter pulmonary nodule in the right upper lobe. If patient is low risk for malignancy, no routine follow-up imaging is recommended. If patient is high risk for malignancy, a non-contrast chest CT at 12 months is optional.This recommendation follows the consensus statement: Guidelines for Management of  Incidental Pulmonary Nodules Detected on CT Images: From the Fleischner Society 2017; Radiology 2017; [phone]. Electronically Signed   By: Tiburcio Pea M.D.   On: 09/27/2023 05:31   DG Chest 2 View Result Date: 09/27/2023 CLINICAL DATA:  Chest pain EXAM: CHEST - 2 VIEW COMPARISON:  10/26/2022 FINDINGS: The heart size and mediastinal contours are within normal limits. Both lungs are clear. The visualized skeletal structures are unremarkable. IMPRESSION: No active cardiopulmonary disease. Electronically Signed   By: Jasmine Pang M.D.   On: 09/27/2023 00:17    Medications Ordered in ED Medications  alum & mag hydroxide-simeth (MAALOX/MYLANTA) 200-200-20 MG/5ML suspension 30 mL (30 mLs Oral Given 09/27/23 0119)    And  lidocaine (XYLOCAINE) 2 % viscous mouth solution 15 mL (15 mLs Oral Given 09/27/23 0119)  HYDROmorphone (DILAUDID) injection 0.5 mg (0.5 mg Intravenous Given 09/27/23 0122)  hydrOXYzine (ATARAX) tablet 25 mg (25 mg Oral Given 09/27/23 0150)  HYDROmorphone (DILAUDID) injection 0.5 mg (0.5 mg Intravenous Given 09/27/23 0311)  iohexol (OMNIPAQUE) 350 MG/ML injection 80 mL (80 mLs Intravenous Contrast Given 09/27/23 0301)  ipratropium-albuterol (DUONEB) 0.5-2.5 (3) MG/3ML nebulizer solution 3 mL (3 mLs Nebulization Given 09/27/23 0544)   Procedures Procedures  (including critical care time) Medical Decision Making / ED Course   Medical Decision Making Amount and/or Complexity of Data Reviewed Labs: ordered. Decision-making details documented in ED Course. Radiology: ordered and independent interpretation performed. Decision-making details documented in ED Course. ECG/medicine tests: ordered and independent interpretation performed. Decision-making details documented in ED Course.  Risk OTC drugs. Prescription drug management. Parenteral controlled substances.    Chest pain  Differential diagnosis considered.  Workup below.  EKG without acute ischemic changes or evidence  of pericarditis.  Serial troponins negative x 2.  Unlikely ACS. CBC without leukocytosis or anemia.  No significant electrolyte derangements or renal insufficiency. Dimer positive but CTA negative for any evidence of acute aortic dissection, pulmonary embolism, pneumonia, pneumothorax.  Patient provided with IV pain medicine, GI cocktail and breathing treatment.  Able to tolerate p.o.  Reported feeling better.    Final Clinical Impression(s) / ED Diagnoses Final diagnoses:  Nonspecific chest pain   The patient appears reasonably screened and/or stabilized for discharge and I doubt any other medical condition or other [facility] requiring further screening, evaluation, or treatment in the ED at this time. I have discussed the findings, Dx and Tx plan with the patient/family who expressed understanding and agree(s) with the plan. Discharge instructions discussed at length. The patient/family was given strict return precautions who verbalized understanding of the instructions. No further questions at time of discharge.  Disposition: Discharge  Condition: Good  ED Discharge Orders     None        Follow Up: No follow-up provider specified.   This chart was dictated using voice recognition software.  Despite best efforts to proofread,  errors can occur which can change the documentation meaning.    Nira Conn, MD 09/27/23 [phone]

## 2023-09-27 NOTE — Discharge Instructions (Addendum)
 For pain control you may take 1000 mg of acetaminophen (Tylenol) every 8 hours and/or 600 mg of Ibuprofen (Motrin, Advil, etc.) every 6-8 hours as needed.  Please limit acetaminophen (Tylenol) to 4000 mg and Ibuprofen (Motrin, Advil, etc.) to 2400 mg for a 24hr period. Please note that other over-the-counter medicine may contain acetaminophen or ibuprofen as a component of their ingredients.

## 2023-11-19 ENCOUNTER — Other Ambulatory Visit: Payer: Self-pay

## 2023-11-19 ENCOUNTER — Emergency Department (HOSPITAL_COMMUNITY): Payer: MEDICAID

## 2023-11-19 ENCOUNTER — Emergency Department (HOSPITAL_COMMUNITY)
Admission: EM | Admit: 2023-11-19 | Discharge: 2023-11-19 | Disposition: A | Discharge status: Home / Self Care | Payer: MEDICAID | Attending: Emergency Medicine | Admitting: Emergency Medicine

## 2023-11-19 DIAGNOSIS — H538 Other visual disturbances: Secondary | ICD-10-CM | POA: Insufficient documentation

## 2023-11-19 DIAGNOSIS — M545 Low back pain, unspecified: Secondary | ICD-10-CM | POA: Diagnosis present

## 2023-11-19 DIAGNOSIS — M542 Cervicalgia: Secondary | ICD-10-CM | POA: Insufficient documentation

## 2023-11-19 DIAGNOSIS — M5416 Radiculopathy, lumbar region: Secondary | ICD-10-CM | POA: Diagnosis not present

## 2023-11-19 LAB — CBC WITH DIFFERENTIAL/PLATELET
Abs Immature Granulocytes: 0.02 10*3/uL (ref 0.00–0.07)
Basophils Absolute: 0.1 10*3/uL (ref 0.0–0.1)
Basophils Relative: 1 %
Eosinophils Absolute: 0.1 10*3/uL (ref 0.0–0.5)
Eosinophils Relative: 2 %
HCT: 40.7 % (ref 36.0–46.0)
Hemoglobin: 13.3 g/dL (ref 12.0–15.0)
Immature Granulocytes: 0 %
Lymphocytes Relative: 34 %
Lymphs Abs: 2.2 10*3/uL (ref 0.7–4.0)
MCH: 28.1 pg (ref 26.0–34.0)
MCHC: 32.7 g/dL (ref 30.0–36.0)
MCV: 85.9 fL (ref 80.0–100.0)
Monocytes Absolute: 0.7 10*3/uL (ref 0.1–1.0)
Monocytes Relative: 11 %
Neutro Abs: 3.4 10*3/uL (ref 1.7–7.7)
Neutrophils Relative %: 52 %
Platelets: 253 10*3/uL (ref 150–400)
RBC: 4.74 MIL/uL (ref 3.87–5.11)
RDW: 14.6 % (ref 11.5–15.5)
WBC: 6.5 10*3/uL (ref 4.0–10.5)
nRBC: 0 % (ref 0.0–0.2)

## 2023-11-19 LAB — COMPREHENSIVE METABOLIC PANEL WITH GFR
ALT: 64 U/L — ABNORMAL HIGH (ref 0–44)
AST: 93 U/L — ABNORMAL HIGH (ref 15–41)
Albumin: 3.7 g/dL (ref 3.5–5.0)
Alkaline Phosphatase: 58 U/L (ref 38–126)
Anion gap: 12 (ref 5–15)
BUN: 6 mg/dL (ref 6–20)
CO2: 23 mmol/L (ref 22–32)
Calcium: 9.4 mg/dL (ref 8.9–10.3)
Chloride: 98 mmol/L (ref 98–111)
Creatinine, Ser: 0.54 mg/dL (ref 0.44–1.00)
GFR, Estimated: >60 mL/min (ref >60)
Glucose, Bld: 90 mg/dL (ref 70–99)
Potassium: 3.4 mmol/L — ABNORMAL LOW (ref 3.5–5.1)
Sodium: 133 mmol/L — ABNORMAL LOW (ref 135–145)
Total Bilirubin: 1.1 mg/dL (ref 0.0–1.2)
Total Protein: 8.9 g/dL — ABNORMAL HIGH (ref 6.5–8.1)

## 2023-11-19 LAB — TROPONIN I (HIGH SENSITIVITY)
Troponin I (High Sensitivity): 4 ng/L (ref <18)
Troponin I (High Sensitivity): 3 ng/L (ref <18)

## 2023-11-19 LAB — HCG, SERUM, QUALITATIVE: Preg, Serum: NEGATIVE

## 2023-11-19 MED ORDER — METHOCARBAMOL 500 MG PO TABS: 500.0000 mg | ORAL_TABLET | Freq: Three times a day (TID) | ORAL | 0 refills | Status: DC | PRN

## 2023-11-19 MED ORDER — IOHEXOL 350 MG/ML SOLN
75.0000 mL | Freq: Once | INTRAVENOUS | Status: AC | PRN
  Administered 2023-11-19: 75 mL via INTRAVENOUS

## 2023-11-19 MED ORDER — ALPRAZOLAM 0.25 MG PO TABS
1.0000 mg | ORAL_TABLET | Freq: Once | ORAL | Status: AC
  Administered 2023-11-19: 1 mg via ORAL
  Filled 2023-11-19: qty 4

## 2023-11-19 MED ORDER — HYDROCODONE-ACETAMINOPHEN 5-325 MG PO TABS
1.0000 | ORAL_TABLET | Freq: Once | ORAL | Status: AC
  Administered 2023-11-19: 1 via ORAL
  Filled 2023-11-19: qty 1

## 2023-11-19 MED ORDER — METHYLPREDNISOLONE 4 MG PO TBPK: ORAL_TABLET | ORAL | 0 refills | Status: DC
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
## 2023-11-19 NOTE — Discharge Instructions (Signed)
 Your traumatic imaging is reassuring. No damage to your head or neck.  Your lumbar spine does show some degenerative changes and pinched nerves in your back.  Take the steroids and muscle relaxers as prescribed.  Follow-up with your doctor.  Return to the ED with worsening pain, weakness, numbness, tingling, bowel or bladder incontinence or other concerns.
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
## 2024-03-23 ENCOUNTER — Emergency Department (HOSPITAL_COMMUNITY): Payer: MEDICAID

## 2024-03-23 ENCOUNTER — Other Ambulatory Visit: Payer: Self-pay

## 2024-03-23 ENCOUNTER — Emergency Department (HOSPITAL_COMMUNITY)
Admission: EM | Admit: 2024-03-23 | Discharge: 2024-03-24 | Disposition: A | Discharge status: Home / Self Care | Payer: MEDICAID | Attending: Emergency Medicine | Admitting: Emergency Medicine

## 2024-03-23 DIAGNOSIS — F17200 Nicotine dependence, unspecified, uncomplicated: Secondary | ICD-10-CM | POA: Diagnosis not present

## 2024-03-23 DIAGNOSIS — R0781 Pleurodynia: Secondary | ICD-10-CM | POA: Diagnosis present

## 2024-03-23 DIAGNOSIS — K920 Hematemesis: Secondary | ICD-10-CM | POA: Diagnosis not present
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
## 2024-03-23 NOTE — ED Notes (Signed)
 Pt unable to void at this time.  KM

## 2024-03-24 LAB — URINALYSIS, ROUTINE W REFLEX MICROSCOPIC
Bilirubin Urine: NEGATIVE
Glucose, UA: NEGATIVE mg/dL
Ketones, ur: NEGATIVE mg/dL
Nitrite: NEGATIVE
Protein, ur: NEGATIVE mg/dL
Specific Gravity, Urine: 1.005 (ref 1.005–1.030)
pH: 5 (ref 5.0–8.0)

## 2024-03-24 LAB — COMPREHENSIVE METABOLIC PANEL WITH GFR
ALT: 82 U/L — ABNORMAL HIGH (ref 0–44)
AST: 97 U/L — ABNORMAL HIGH (ref 15–41)
Albumin: 3.7 g/dL (ref 3.5–5.0)
Alkaline Phosphatase: 53 U/L (ref 38–126)
Anion gap: 14 (ref 5–15)
BUN: 5 mg/dL — ABNORMAL LOW (ref 6–20)
CO2: 19 mmol/L — ABNORMAL LOW (ref 22–32)
Calcium: 8.6 mg/dL — ABNORMAL LOW (ref 8.9–10.3)
Chloride: 105 mmol/L (ref 98–111)
Creatinine, Ser: 0.49 mg/dL (ref 0.44–1.00)
GFR, Estimated: >60 mL/min (ref >60)
Glucose, Bld: 87 mg/dL (ref 70–99)
Potassium: 4.2 mmol/L (ref 3.5–5.1)
Sodium: 138 mmol/L (ref 135–145)
Total Bilirubin: 0.5 mg/dL (ref 0.0–1.2)
Total Protein: 8.5 g/dL — ABNORMAL HIGH (ref 6.5–8.1)

## 2024-03-24 LAB — LIPASE, BLOOD: Lipase: 50 U/L (ref 11–51)

## 2024-03-24 LAB — CBC
HCT: 41.3 % (ref 36.0–46.0)
Hemoglobin: 13.6 g/dL (ref 12.0–15.0)
MCH: 29.2 pg (ref 26.0–34.0)
MCHC: 32.9 g/dL (ref 30.0–36.0)
MCV: 88.6 fL (ref 80.0–100.0)
Platelets: 219 K/uL (ref 150–400)
RBC: 4.66 MIL/uL (ref 3.87–5.11)
RDW: 15 % (ref 11.5–15.5)
WBC: 6.6 K/uL (ref 4.0–10.5)
nRBC: 0 % (ref 0.0–0.2)

## 2024-03-24 LAB — HCG, SERUM, QUALITATIVE: Preg, Serum: NEGATIVE

## 2024-03-24 MED ORDER — HYDROCODONE-ACETAMINOPHEN 5-325 MG PO TABS
1.0000 | ORAL_TABLET | Freq: Once | ORAL | Status: AC
  Administered 2024-03-24: 1 via ORAL
  Filled 2024-03-24: qty 1
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
## 2024-03-24 NOTE — Discharge Instructions (Addendum)
 Your workup this evening was reassuring.  Please follow with your primary care provider for further evaluation of your rib pain.  If you continue to have episodes of bloody emesis please return to the emergency department for reevaluation.

## 2024-06-20 ENCOUNTER — Ambulatory Visit (HOSPITAL_COMMUNITY): Payer: MEDICAID

## 2024-08-07 ENCOUNTER — Encounter (HOSPITAL_COMMUNITY): Payer: Self-pay

## 2024-08-07 ENCOUNTER — Inpatient Hospital Stay (HOSPITAL_COMMUNITY): Admission: AD | Admit: 2024-08-07 | Payer: MEDICAID | Source: Intra-hospital

## 2024-08-07 ENCOUNTER — Ambulatory Visit (HOSPITAL_COMMUNITY)
Admission: EM | Admit: 2024-08-07 | Discharge: 2024-08-07 | Disposition: A | Discharge status: Other Acute Inpatient Hospital | Payer: MEDICAID

## 2024-08-07 ENCOUNTER — Inpatient Hospital Stay (HOSPITAL_COMMUNITY)
Admission: AD | Admit: 2024-08-07 | Discharge: 2024-08-11 | DRG: 885 | Disposition: A | Discharge status: Home / Self Care | Payer: MEDICAID | Source: Intra-hospital

## 2024-08-07 ENCOUNTER — Other Ambulatory Visit: Payer: Self-pay

## 2024-08-07 DIAGNOSIS — F411 Generalized anxiety disorder: Secondary | ICD-10-CM | POA: Diagnosis present

## 2024-08-07 DIAGNOSIS — F1120 Opioid dependence, uncomplicated: Secondary | ICD-10-CM | POA: Diagnosis present

## 2024-08-07 DIAGNOSIS — F1320 Sedative, hypnotic or anxiolytic dependence, uncomplicated: Secondary | ICD-10-CM | POA: Insufficient documentation

## 2024-08-07 DIAGNOSIS — F319 Bipolar disorder, unspecified: Secondary | ICD-10-CM | POA: Diagnosis not present

## 2024-08-07 DIAGNOSIS — F1590 Other stimulant use, unspecified, uncomplicated: Secondary | ICD-10-CM | POA: Insufficient documentation

## 2024-08-07 DIAGNOSIS — F17200 Nicotine dependence, unspecified, uncomplicated: Secondary | ICD-10-CM | POA: Insufficient documentation

## 2024-08-07 DIAGNOSIS — I10 Essential (primary) hypertension: Secondary | ICD-10-CM | POA: Diagnosis present

## 2024-08-07 DIAGNOSIS — Z8249 Family history of ischemic heart disease and other diseases of the circulatory system: Secondary | ICD-10-CM | POA: Diagnosis not present

## 2024-08-07 DIAGNOSIS — Z818 Family history of other mental and behavioral disorders: Secondary | ICD-10-CM | POA: Diagnosis not present

## 2024-08-07 DIAGNOSIS — K219 Gastro-esophageal reflux disease without esophagitis: Secondary | ICD-10-CM | POA: Diagnosis present

## 2024-08-07 DIAGNOSIS — I4581 Long QT syndrome: Secondary | ICD-10-CM | POA: Diagnosis not present

## 2024-08-07 DIAGNOSIS — Z560 Unemployment, unspecified: Secondary | ICD-10-CM

## 2024-08-07 DIAGNOSIS — G894 Chronic pain syndrome: Secondary | ICD-10-CM | POA: Diagnosis present

## 2024-08-07 DIAGNOSIS — F332 Major depressive disorder, recurrent severe without psychotic features: Secondary | ICD-10-CM | POA: Diagnosis present

## 2024-08-07 DIAGNOSIS — F4321 Adjustment disorder with depressed mood: Principal | ICD-10-CM | POA: Diagnosis present

## 2024-08-07 DIAGNOSIS — F1911 Other psychoactive substance abuse, in remission: Secondary | ICD-10-CM | POA: Insufficient documentation

## 2024-08-07 DIAGNOSIS — G4700 Insomnia, unspecified: Secondary | ICD-10-CM | POA: Diagnosis present

## 2024-08-07 DIAGNOSIS — F1510 Other stimulant abuse, uncomplicated: Secondary | ICD-10-CM | POA: Diagnosis present

## 2024-08-07 DIAGNOSIS — Z9049 Acquired absence of other specified parts of digestive tract: Secondary | ICD-10-CM | POA: Diagnosis not present

## 2024-08-07 DIAGNOSIS — F4001 Agoraphobia with panic disorder: Secondary | ICD-10-CM | POA: Insufficient documentation

## 2024-08-07 DIAGNOSIS — R45851 Suicidal ideations: Secondary | ICD-10-CM | POA: Insufficient documentation

## 2024-08-07 DIAGNOSIS — F603 Borderline personality disorder: Secondary | ICD-10-CM | POA: Insufficient documentation

## 2024-08-07 DIAGNOSIS — F1010 Alcohol abuse, uncomplicated: Secondary | ICD-10-CM | POA: Insufficient documentation

## 2024-08-07 DIAGNOSIS — Z79899 Other long term (current) drug therapy: Secondary | ICD-10-CM | POA: Diagnosis not present

## 2024-08-07 DIAGNOSIS — Z9152 Personal history of nonsuicidal self-harm: Secondary | ICD-10-CM | POA: Insufficient documentation

## 2024-08-07 DIAGNOSIS — Z9151 Personal history of suicidal behavior: Secondary | ICD-10-CM | POA: Diagnosis not present

## 2024-08-07 DIAGNOSIS — F17210 Nicotine dependence, cigarettes, uncomplicated: Secondary | ICD-10-CM | POA: Diagnosis present

## 2024-08-07 DIAGNOSIS — F3130 Bipolar disorder, current episode depressed, mild or moderate severity, unspecified: Principal | ICD-10-CM | POA: Diagnosis present

## 2024-08-07 DIAGNOSIS — Z91410 Personal history of adult physical and sexual abuse: Secondary | ICD-10-CM

## 2024-08-07 DIAGNOSIS — E785 Hyperlipidemia, unspecified: Secondary | ICD-10-CM | POA: Diagnosis present

## 2024-08-07 DIAGNOSIS — Z8041 Family history of malignant neoplasm of ovary: Secondary | ICD-10-CM | POA: Diagnosis not present

## 2024-08-07 DIAGNOSIS — Z5941 Food insecurity: Secondary | ICD-10-CM

## 2024-08-07 DIAGNOSIS — Z91411 Personal history of adult psychological abuse: Secondary | ICD-10-CM | POA: Diagnosis not present

## 2024-08-07 DIAGNOSIS — F1410 Cocaine abuse, uncomplicated: Secondary | ICD-10-CM | POA: Insufficient documentation

## 2024-08-07 DIAGNOSIS — G8929 Other chronic pain: Secondary | ICD-10-CM | POA: Insufficient documentation

## 2024-08-07 HISTORY — DX: Suicide attempt, initial encounter: T14.91XA

## 2024-08-07 HISTORY — DX: Unspecified viral hepatitis C without hepatic coma: B19.20

## 2024-08-07 LAB — COMPREHENSIVE METABOLIC PANEL WITH GFR
ALT: 56 U/L — ABNORMAL HIGH (ref 0–44)
AST: 56 U/L — ABNORMAL HIGH (ref 15–41)
Albumin: 4.3 g/dL (ref 3.5–5.0)
Alkaline Phosphatase: 70 U/L (ref 38–126)
Anion gap: 15 (ref 5–15)
BUN: 5 mg/dL — ABNORMAL LOW (ref 6–20)
CO2: 21 mmol/L — ABNORMAL LOW (ref 22–32)
Calcium: 9.2 mg/dL (ref 8.9–10.3)
Chloride: 100 mmol/L (ref 98–111)
Creatinine, Ser: 0.53 mg/dL (ref 0.44–1.00)
GFR, Estimated: >60 mL/min
Glucose, Bld: 88 mg/dL (ref 70–99)
Potassium: 3.8 mmol/L (ref 3.5–5.1)
Sodium: 136 mmol/L (ref 135–145)
Total Bilirubin: 0.5 mg/dL (ref 0.0–1.2)
Total Protein: 8.4 g/dL — ABNORMAL HIGH (ref 6.5–8.1)

## 2024-08-07 LAB — CBC WITH DIFFERENTIAL/PLATELET
Abs Immature Granulocytes: 0.02 K/uL (ref 0.00–0.07)
Basophils Absolute: 0.1 K/uL (ref 0.0–0.1)
Basophils Relative: 1 %
Eosinophils Absolute: 0.2 K/uL (ref 0.0–0.5)
Eosinophils Relative: 2 %
HCT: 37.1 % (ref 36.0–46.0)
Hemoglobin: 12.6 g/dL (ref 12.0–15.0)
Immature Granulocytes: 0 %
Lymphocytes Relative: 31 %
Lymphs Abs: 2.1 K/uL (ref 0.7–4.0)
MCH: 27.7 pg (ref 26.0–34.0)
MCHC: 34 g/dL (ref 30.0–36.0)
MCV: 81.5 fL (ref 80.0–100.0)
Monocytes Absolute: 0.7 K/uL (ref 0.1–1.0)
Monocytes Relative: 10 %
Neutro Abs: 3.9 K/uL (ref 1.7–7.7)
Neutrophils Relative %: 56 %
Platelets: 195 K/uL (ref 150–400)
RBC: 4.55 MIL/uL (ref 3.87–5.11)
RDW: 16.3 % — ABNORMAL HIGH (ref 11.5–15.5)
WBC: 6.9 K/uL (ref 4.0–10.5)
nRBC: 0 % (ref 0.0–0.2)

## 2024-08-07 LAB — POCT URINE DRUG SCREEN - MANUAL ENTRY (I-SCREEN)
POC Amphetamine UR: POSITIVE — AB
POC Buprenorphine (BUP): NOT DETECTED
POC Cocaine UR: NOT DETECTED
POC Marijuana UR: NOT DETECTED
POC Methadone UR: NOT DETECTED
POC Methamphetamine UR: POSITIVE — AB
POC Morphine: NOT DETECTED
POC Oxazepam (BZO): POSITIVE — AB
POC Oxycodone UR: NOT DETECTED
POC Secobarbital (BAR): NOT DETECTED

## 2024-08-07 LAB — ETHANOL: Alcohol, Ethyl (B): 134 mg/dL — ABNORMAL HIGH

## 2024-08-07 LAB — POC URINE PREG, ED: Preg Test, Ur: NEGATIVE

## 2024-08-07 LAB — TSH: TSH: 0.546 u[IU]/mL (ref 0.350–4.500)

## 2024-08-07 MED ORDER — HYDROXYZINE HCL 25 MG PO TABS
25.0000 mg | ORAL_TABLET | Freq: Four times a day (QID) | ORAL | Status: DC | PRN
  Administered 2024-08-07: 25 mg via ORAL
  Filled 2024-08-07: qty 1

## 2024-08-07 MED ORDER — LOPERAMIDE HCL 2 MG PO CAPS: 2.0000 mg | ORAL_CAPSULE | ORAL | Status: DC | PRN

## 2024-08-07 MED ORDER — OXYCODONE HCL 5 MG PO TABS
10.0000 mg | ORAL_TABLET | Freq: Four times a day (QID) | ORAL | Status: DC | PRN
  Administered 2024-08-07 – 2024-08-11 (×11): 10 mg via ORAL
  Filled 2024-08-07 (×11): qty 2

## 2024-08-07 MED ORDER — HALOPERIDOL LACTATE 5 MG/ML IJ SOLN: 5.0000 mg | Freq: Three times a day (TID) | INTRAMUSCULAR | Status: DC | PRN

## 2024-08-07 MED ORDER — NICOTINE 14 MG/24HR TD PT24
14.0000 mg | MEDICATED_PATCH | Freq: Every day | TRANSDERMAL | Status: DC
  Administered 2024-08-08 – 2024-08-11 (×4): 14 mg via TRANSDERMAL
  Filled 2024-08-07 (×4): qty 1

## 2024-08-07 MED ORDER — PROPRANOLOL HCL 10 MG PO TABS
10.0000 mg | ORAL_TABLET | Freq: Three times a day (TID) | ORAL | Status: DC | PRN
  Administered 2024-08-07: 10 mg via ORAL
  Filled 2024-08-07: qty 1

## 2024-08-07 MED ORDER — DIPHENHYDRAMINE HCL 25 MG PO CAPS: 50.0000 mg | ORAL_CAPSULE | Freq: Three times a day (TID) | ORAL | Status: DC | PRN

## 2024-08-07 MED ORDER — ONDANSETRON 4 MG PO TBDP: 4.0000 mg | ORAL_TABLET | Freq: Four times a day (QID) | ORAL | Status: AC | PRN

## 2024-08-07 MED ORDER — MAGNESIUM HYDROXIDE 400 MG/5ML PO SUSP
30.0000 mL | Freq: Every day | ORAL | Status: DC | PRN
  Administered 2024-08-10: 30 mL via ORAL
  Filled 2024-08-07: qty 30

## 2024-08-07 MED ORDER — ACETAMINOPHEN 325 MG PO TABS: 650.0000 mg | ORAL_TABLET | Freq: Four times a day (QID) | ORAL | Status: DC | PRN

## 2024-08-07 MED ORDER — LORAZEPAM 2 MG/ML IJ SOLN: 2.0000 mg | Freq: Three times a day (TID) | INTRAMUSCULAR | Status: DC | PRN

## 2024-08-07 MED ORDER — DIPHENHYDRAMINE HCL 50 MG/ML IJ SOLN: 50.0000 mg | Freq: Three times a day (TID) | INTRAMUSCULAR | Status: DC | PRN

## 2024-08-07 MED ORDER — DIPHENHYDRAMINE HCL 50 MG PO CAPS
50.0000 mg | ORAL_CAPSULE | Freq: Three times a day (TID) | ORAL | Status: DC | PRN
  Administered 2024-08-07: 50 mg via ORAL
  Filled 2024-08-07: qty 1

## 2024-08-07 MED ORDER — HALOPERIDOL 5 MG PO TABS
5.0000 mg | ORAL_TABLET | Freq: Three times a day (TID) | ORAL | Status: DC | PRN
  Administered 2024-08-07: 5 mg via ORAL
  Filled 2024-08-07: qty 1

## 2024-08-07 MED ORDER — THIAMINE MONONITRATE 100 MG PO TABS: 100.0000 mg | ORAL_TABLET | Freq: Every day | ORAL | Status: DC

## 2024-08-07 MED ORDER — ALUM & MAG HYDROXIDE-SIMETH 200-200-20 MG/5ML PO SUSP
30.0000 mL | ORAL | Status: DC | PRN
  Administered 2024-08-08: 30 mL via ORAL
  Filled 2024-08-07: qty 30

## 2024-08-07 MED ORDER — ONDANSETRON 4 MG PO TBDP: 4.0000 mg | ORAL_TABLET | Freq: Four times a day (QID) | ORAL | Status: DC | PRN

## 2024-08-07 MED ORDER — TRAZODONE HCL 50 MG PO TABS
50.0000 mg | ORAL_TABLET | Freq: Every evening | ORAL | Status: DC | PRN
  Administered 2024-08-07 – 2024-08-10 (×4): 50 mg via ORAL
  Filled 2024-08-07 (×4): qty 1

## 2024-08-07 MED ORDER — MAGNESIUM HYDROXIDE 400 MG/5ML PO SUSP: 30.0000 mL | Freq: Every day | ORAL | Status: DC | PRN

## 2024-08-07 MED ORDER — NICOTINE 14 MG/24HR TD PT24
14.0000 mg | MEDICATED_PATCH | Freq: Every day | TRANSDERMAL | Status: DC
  Administered 2024-08-07: 14 mg via TRANSDERMAL
  Filled 2024-08-07: qty 1

## 2024-08-07 MED ORDER — LOPERAMIDE HCL 2 MG PO CAPS: 2.0000 mg | ORAL_CAPSULE | ORAL | Status: AC | PRN

## 2024-08-07 MED ORDER — LORAZEPAM 1 MG PO TABS: 1.0000 mg | ORAL_TABLET | Freq: Four times a day (QID) | ORAL | Status: DC | PRN

## 2024-08-07 MED ORDER — VITAMIN B-1 100 MG PO TABS
100.0000 mg | ORAL_TABLET | Freq: Every day | ORAL | Status: DC
  Administered 2024-08-08 – 2024-08-11 (×4): 100 mg via ORAL
  Filled 2024-08-07 (×4): qty 1

## 2024-08-07 MED ORDER — ADULT MULTIVITAMIN W/MINERALS CH
1.0000 | ORAL_TABLET | Freq: Every day | ORAL | Status: DC
  Administered 2024-08-08 – 2024-08-11 (×4): 1 via ORAL
  Filled 2024-08-07 (×4): qty 1

## 2024-08-07 MED ORDER — ADULT MULTIVITAMIN W/MINERALS CH
1.0000 | ORAL_TABLET | Freq: Every day | ORAL | Status: DC
  Administered 2024-08-07: 1 via ORAL
  Filled 2024-08-07: qty 1

## 2024-08-07 MED ORDER — ALUM & MAG HYDROXIDE-SIMETH 200-200-20 MG/5ML PO SUSP: 30.0000 mL | ORAL | Status: DC | PRN

## 2024-08-07 MED ORDER — HALOPERIDOL LACTATE 5 MG/ML IJ SOLN: 10.0000 mg | Freq: Three times a day (TID) | INTRAMUSCULAR | Status: DC | PRN

## 2024-08-07 MED ORDER — HYDROXYZINE HCL 25 MG PO TABS
25.0000 mg | ORAL_TABLET | Freq: Three times a day (TID) | ORAL | Status: DC | PRN
  Administered 2024-08-07 – 2024-08-09 (×5): 25 mg via ORAL
  Filled 2024-08-07 (×5): qty 1

## 2024-08-07 MED ORDER — LORAZEPAM 1 MG PO TABS: 1.0000 mg | ORAL_TABLET | Freq: Four times a day (QID) | ORAL | Status: AC | PRN

## 2024-08-07 MED ORDER — THIAMINE HCL 100 MG/ML IJ SOLN: 100.0000 mg | Freq: Once | INTRAMUSCULAR | Status: DC

## 2024-08-07 MED ORDER — PANTOPRAZOLE SODIUM 40 MG PO TBEC: 40.0000 mg | DELAYED_RELEASE_TABLET | Freq: Every day | ORAL | Status: DC

## 2024-08-07 MED ORDER — HALOPERIDOL 5 MG PO TABS: 5.0000 mg | ORAL_TABLET | Freq: Three times a day (TID) | ORAL | Status: DC | PRN

## 2024-08-07 NOTE — Progress Notes (Signed)
 Pt was accepted to [facility] TODAY 08/07/2024  [phone]. Bed assignment: 304  Pt meets inpatient criteria per: Ashley Gravely MD  Attending Physician will be Donnelly MD  Report can be called to: [phone]  Pt can arrive after: RN WILL UPDATE  Care Team Notified: Cherylynn Ernst RN, Ashley Gravely MD  Tunisia Ryder Man LCSW  08/07/2024 1:36 PM
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
## 2024-08-07 NOTE — ED Notes (Signed)
 RN spoke with patient A&Ox4, patient is paranoid and anxious. Denies intent to harm self/others when asked. Denies A/VH or any physical complaints when asked. Active listening, support and encouragement provided. Routine safety checks conducted according to facility protocol. Encouraged patient to notify staff if thoughts of harm toward self or others arise. Patient verbalize understanding and agreement.Beverage and PRN meds given.

## 2024-08-07 NOTE — ED Provider Notes (Signed)
 [facility] Urgent Care Continuous Assessment Admission H&P  Date: 08/07/24 Patient Name: [name] MRN: [id number] Chief Complaint: suicidal ideation   Diagnoses:  Final diagnoses:  Suicidal ideation  Alcohol abuse    HPI: [name], 49 y/o female with a history of bipolar disorder, substance abuse, suicidal ideation.  Presented to [facility] via [facility].  Patient reports she is suicidal and had a plan to hurt herself when asked what was the plan she stated to take a bunch of pills.  Patient currently lives with her boyfriend, it is unclear if she is homeless.  Patient reports she does not work.  Currently sees a doctor for med management.  Currently not seeing a place.  Did also report that tonight she had a 40 ounce beer.  Patient denies smoking.  To face evaluation of patient, patient is alert and oriented x 4, speech is clear, maintaining minimal to no eye contact.  Does not appear to be in any immediate distress, patient can be very agitated when asked questions and does have an nonchalant behavior.  Patient endorsed suicidal ideation stating she was going to take pills.  Patient denies HI, AVH or paranoia.  Reports using a 40 ounce alcoholic beverage tonight is not sure how frequent patient drinks as patient is not forthcoming with information.  Patient denies smoking.  However patient does have a substance abuse history.  Patient does not seem to be influenced by internal stimuli at this present moment.  However given patient history and current presentation.  Writer discussed patient the need for admission.  Recommend inpatient admission bed becomes available  Total Time spent with patient: 30 minutes  Musculoskeletal  Strength & Muscle Tone: within normal limits Gait & Station: normal Patient leans: N/A  Psychiatric Specialty Exam  Presentation General Appearance:  Casual  Eye Contact: Fair  Speech: Clear and Coherent  Speech Volume: Normal  Handedness: Right   Mood and  Affect  Mood: Anxious  Affect: Blunt   Thought Process  Thought Processes: Coherent  Descriptions of Associations:Intact  Orientation:Full (Time, Place and Person)  Thought Content:WDL  Diagnosis of Schizophrenia or Schizoaffective disorder in past: No data recorded  Hallucinations:Hallucinations: None  Ideas of Reference:None  Suicidal Thoughts:Suicidal Thoughts: Yes, Active SI Active Intent and/or Plan: With Intent; With Plan  Homicidal Thoughts:Homicidal Thoughts: No   Sensorium  Memory: Immediate Fair  Judgment: Intact  Insight: Poor   Executive Functions  Concentration: Poor  Attention Span: Fair  Recall: [name] of Knowledge: Fair  Language: Fair   Psychomotor Activity  Psychomotor Activity: Psychomotor Activity: Normal   Assets  Assets: Desire for Improvement; Resilience; Vocational/Educational   Sleep  Sleep: Sleep: Fair Number of Hours of Sleep: 7   Nutritional Assessment (For OBS and FBC admissions only) Has the patient had a weight loss or gain of 10 pounds or more in the last 3 months?: No Has the patient had a decrease in food intake/or appetite?: No Does the patient have dental problems?: No Does the patient have eating habits or behaviors that may be indicators of an eating disorder including binging or inducing vomiting?: No Has the patient recently lost weight without trying?: 0 Has the patient been eating poorly because of a decreased appetite?: 0 Malnutrition Screening Tool Score: 0    Physical Exam HENT:     Head: Normocephalic.     Nose: Nose normal.  Eyes:     Pupils: Pupils are equal, round, and reactive to light.  Cardiovascular:  Rate and Rhythm: Tachycardia present.  Pulmonary:     Effort: Pulmonary effort is normal.  Musculoskeletal:        General: Normal range of motion.     Cervical back: Normal range of motion.  Neurological:     General: No focal deficit present.     Mental Status:  She is alert.  Psychiatric:        Mood and Affect: Mood normal.        Behavior: Behavior normal.    Review of Systems  Constitutional: Negative.   HENT: Negative.    Eyes: Negative.   Respiratory: Negative.    Cardiovascular: Negative.   Gastrointestinal: Negative.   Genitourinary: Negative.   Musculoskeletal: Negative.   Skin: Negative.   Neurological: Negative.   Psychiatric/Behavioral:  Positive for suicidal ideas. The patient is nervous/anxious.     Blood pressure (!) 149/94, pulse (!) 105, resp. rate 19, SpO2 97%. There is no height or weight on file to calculate BMI.  Past Psychiatric History: Bipolar disorder, substance abuse, suicidal ideation  Is the patient at risk to self? Yes  Has the patient been a risk to self in the past 6 months? No .    Has the patient been a risk to self within the distant past? Yes   Is the patient a risk to others? No   Has the patient been a risk to others in the past 6 months? No   Has the patient been a risk to others within the distant past? No   Past Medical History: See chart  Family History: Unknown  Social History: Cocaine, other substance abuse, alcohol  Last Labs:  Admission on 03/23/2024, Discharged on 03/24/2024  Component Date Value Ref Range Status   Lipase 03/23/2024 50  11 - 51 U/L Final   Performed at [facility], [address], [place], [place] [address]   Sodium 03/23/2024 138  135 - 145 mmol/L Final   Potassium 03/23/2024 4.2  3.5 - 5.1 mmol/L Final   Chloride 03/23/2024 105  98 - 111 mmol/L Final   CO2 03/23/2024 19 (L)  22 - 32 mmol/L Final   Glucose, Bld 03/23/2024 87  70 - 99 mg/dL Final   Glucose reference range applies only to samples taken after fasting for at least 8 hours.   BUN 03/23/2024 5 (L)  6 - 20 mg/dL Final   Creatinine, Ser 03/23/2024 0.49  0.44 - 1.00 mg/dL Final   Calcium  03/23/2024 8.6 (L)  8.9 - 10.3 mg/dL Final   Total Protein 90/94/7974 8.5 (H)  6.5 - 8.1 g/dL Final   Albumin  90/94/7974 3.7  3.5 - 5.0 g/dL Final   AST 90/94/7974 97 (H)  15 - 41 U/L Final   ALT 03/23/2024 82 (H)  0 - 44 U/L Final   Alkaline Phosphatase 03/23/2024 53  38 - 126 U/L Final   Total Bilirubin 03/23/2024 0.5  0.0 - 1.2 mg/dL Final   GFR, Estimated 03/23/2024 >60  >60 mL/min Final   Comment: (NOTE) Calculated using the CKD-EPI Creatinine Equation (2021)    Anion gap 03/23/2024 14  5 - 15 Final   Performed at [facility], [address], [place], [place] [address]   WBC 03/23/2024 6.6  4.0 - 10.5 K/uL Final   RBC 03/23/2024 4.66  3.87 - 5.11 MIL/uL Final   Hemoglobin 03/23/2024 13.6  12.0 - 15.0 g/dL Final   HCT 90/94/7974 41.3  36.0 - 46.0 % Final  MCV 03/23/2024 88.6  80.0 - 100.0 fL Final   MCH 03/23/2024 29.2  26.0 - 34.0 pg Final   MCHC 03/23/2024 32.9  30.0 - 36.0 g/dL Final   RDW 90/94/7974 15.0  11.5 - 15.5 % Final   Platelets 03/23/2024 219  150 - 400 K/uL Final   nRBC 03/23/2024 0.0  0.0 - 0.2 % Final   Performed at [facility], [address] [place], [place] [address]   Color, Urine 03/23/2024 YELLOW  YELLOW Final   APPearance 03/23/2024 CLEAR  CLEAR Final   Specific Gravity, Urine 03/23/2024 1.005  1.005 - 1.030 Final   pH 03/23/2024 5.0  5.0 - 8.0 Final   Glucose, UA 03/23/2024 NEGATIVE  NEGATIVE mg/dL Final   Hgb urine dipstick 03/23/2024 SMALL (A)  NEGATIVE Final   Bilirubin Urine 03/23/2024 NEGATIVE  NEGATIVE Final   Ketones, ur 03/23/2024 NEGATIVE  NEGATIVE mg/dL Final   Protein, ur 90/94/7974 NEGATIVE  NEGATIVE mg/dL Final   Nitrite 90/94/7974 NEGATIVE  NEGATIVE Final   Leukocytes,Ua 03/23/2024 SMALL (A)  NEGATIVE Final   RBC / HPF 03/23/2024 0-5  0 - 5 RBC/hpf Final   WBC, UA 03/23/2024 6-10  0 - 5 WBC/hpf Final   Bacteria, UA 03/23/2024 RARE (A)  NONE SEEN Final   Squamous Epithelial / HPF 03/23/2024 0-5  0 - 5 /HPF Final   Mucus 03/23/2024 PRESENT   Final   Performed at [facility], [address], [place], [place] [address]    Preg, Serum 03/23/2024 NEGATIVE  NEGATIVE Final   Comment:        THE SENSITIVITY OF THIS METHODOLOGY IS >10 mIU/mL. Performed at [facility], [address], [place], [place] [address]     Allergies: Aripiprazole, Risperidone and paliperidone, Tramadol, and Lamictal  [lamotrigine ]  Medications:  PTA Medications  Medication Sig   albuterol  (PROVENTIL  HFA;VENTOLIN  HFA) 108 (90 Base) MCG/ACT inhaler Inhale 2 puffs into the lungs every 6 (six) hours as needed for wheezing or shortness of breath.   oxyCODONE -acetaminophen  (PERCOCET) 10-325 MG tablet Take 1 tablet by mouth every 6 (six) hours as needed for pain.   traZODone  (DESYREL ) 50 MG tablet Take 1 tablet (50 mg total) by mouth at bedtime as needed for sleep.   nicotine  (NICODERM CQ  - DOSED IN MG/24 HOURS) 21 mg/24hr patch Place 1 patch (21 mg total) onto the skin daily as needed (nicotine  dependence).   nicotine  polacrilex (NICORETTE ) 2 MG gum Take 1 each (2 mg total) by mouth every 4 (four) hours as needed for smoking cessation.   ibuprofen  (ADVIL ) 200 MG tablet Take 1 tablet (200 mg total) by mouth every 6 (six) hours as needed for mild pain (alternate with tylenol ).   hydrOXYzine  (ATARAX ) 25 MG tablet Take 1 tablet (25 mg total) by mouth 3 (three) times daily as needed for anxiety.   neomycin -bacitracin -polymyxin (NEOSPORIN) OINT Apply 1 Application topically 2 (two) times daily.   sertraline  (ZOLOFT ) 100 MG tablet Take 1 tablet (100 mg total) by mouth daily.   omeprazole  (PRILOSEC) 40 MG capsule Take 1 capsule (40 mg total) by mouth daily.   methylPREDNISolone  (MEDROL  DOSEPAK) 4 MG TBPK tablet As directed   methocarbamol  (ROBAXIN ) 500 MG tablet Take 1 tablet (500 mg total) by mouth every 8 (eight) hours as needed for muscle spasms.      Medical Decision Making  Inpatient unit    Recommendations  Based on my evaluation the patient does not appear to have an  emergency medical condition.  Gaither Pouch, NP 08/07/24  1:56  AM

## 2024-08-07 NOTE — Progress Notes (Signed)
(  Sleep Hours) -6.25  (Any PRNs that were needed, meds refused, or side effects to meds)- hydroxyzine  25mg , Trazodone  50mg   (Any disturbances and when (visitation, over night)-none  (Concerns raised by the patient)- Patient concerned because missed today's dose of omeprazole ; states can't miss anymore doses  (SI/HI/AVH)-admits passive SI/no plan. Denies HI/hallucinations
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
## 2024-08-07 NOTE — Group Note (Signed)
 Date:  08/07/2024 Time:  3:48 PM  Group Topic/Focus: Write a note to your past or present self Emotional Education:   The focus of this group is to discuss what feelings/emotions are, and how they are experienced.    Participation Level:  Did Not Attend   [name] 08/07/2024, 3:48 PM
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
## 2024-08-07 NOTE — Plan of Care (Signed)
   Problem: Education: Goal: Knowledge of [place] Education information/materials will improve Outcome: Progressing Goal: Verbalization of understanding the information provided will improve Outcome: Progressing

## 2024-08-07 NOTE — Group Note (Signed)
 Date:  08/07/2024 Time:  4:24 PM  Group Topic/Focus:[employer]  SmartGoals Group:   The focus of this group is to help patients establish daily goals to achieve during treatment and discuss how the patient can incorporate goal setting into their daily lives to aide in recovery.    Participation Level:  Did Not Attend   [name] 08/07/2024, 4:24 PM

## 2024-08-07 NOTE — ED Notes (Addendum)
 Patient resting with eyes closed. Even rise and fall of chest with unlabored breathing. No s/s of current distress.
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
## 2024-08-07 NOTE — BHH Group Notes (Signed)
 BHH Group Notes:  (Nursing/MHT/Case Management/Adjunct)  Date:  08/07/2024  Time:  11:53 PM  Type of Therapy:  Wrap up group  Participation Level:  Active  Participation Quality:  Appropriate  Affect:  Appropriate  Cognitive:  Appropriate  Insight:  Appropriate  Engagement in Group:  Engaged  Modes of Intervention:  Education  Summary of Progress/Problems:  Goal to get here. Rated day 6/10.  [name] 08/07/2024, 11:53 PM

## 2024-08-07 NOTE — ED Notes (Signed)
 Patient noted to be anxious with BP elevated and slight tremors. NP notified and medication ordered and given to patient. Patient denies SI,HI, and A/V/H with no plan or intent. Patient ate breakfast and remains cooperative in unit.

## 2024-08-07 NOTE — ED Notes (Signed)
 Patient is transferring to [facility] at this time via safe transport. Voluntary consent, EMTALA, and other transfer paperwork sent with patient and provided to transport.  Denies SI,HI, and A/V/H at this time. Calm and cooperative at time of transferring. All valuables/belongings sent with patient. Patient in no current distress.
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
## 2024-08-07 NOTE — Plan of Care (Signed)
   Problem: Education: Goal: Knowledge of [place] [facility] Education information/materials will improve Outcome: Progressing Goal: Emotional status will improve Outcome: Progressing Goal: Mental status will improve Outcome: Progressing

## 2024-08-08 ENCOUNTER — Encounter (HOSPITAL_COMMUNITY): Payer: Self-pay

## 2024-08-08 LAB — URINALYSIS, W/ REFLEX TO CULTURE (INFECTION SUSPECTED)
Bilirubin Urine: NEGATIVE
Glucose, UA: NEGATIVE mg/dL
Hgb urine dipstick: NEGATIVE
Ketones, ur: NEGATIVE mg/dL
Nitrite: NEGATIVE
Protein, ur: NEGATIVE mg/dL
Specific Gravity, Urine: 1.015 (ref 1.005–1.030)
WBC, UA: >50 WBC/hpf (ref 0–5)
pH: 6 (ref 5.0–8.0)

## 2024-08-08 MED ORDER — HALOPERIDOL 2 MG PO TABS
1.0000 mg | ORAL_TABLET | Freq: Two times a day (BID) | ORAL | Status: DC
  Administered 2024-08-08 – 2024-08-11 (×6): 1 mg via ORAL
  Filled 2024-08-08 (×6): qty 1

## 2024-08-08 MED ORDER — LISINOPRIL 5 MG PO TABS
5.0000 mg | ORAL_TABLET | Freq: Every day | ORAL | Status: DC
  Administered 2024-08-08 – 2024-08-11 (×4): 5 mg via ORAL
  Filled 2024-08-08 (×4): qty 1

## 2024-08-08 MED ORDER — PANTOPRAZOLE SODIUM 40 MG PO TBEC
40.0000 mg | DELAYED_RELEASE_TABLET | Freq: Every day | ORAL | Status: DC
  Administered 2024-08-08 – 2024-08-11 (×4): 40 mg via ORAL
  Filled 2024-08-08 (×4): qty 1

## 2024-08-08 MED ORDER — BENZTROPINE MESYLATE 0.5 MG PO TABS
0.5000 mg | ORAL_TABLET | Freq: Two times a day (BID) | ORAL | Status: DC
  Administered 2024-08-08 – 2024-08-11 (×6): 0.5 mg via ORAL
  Filled 2024-08-08 (×6): qty 1

## 2024-08-08 MED ORDER — SERTRALINE HCL 50 MG PO TABS
50.0000 mg | ORAL_TABLET | Freq: Every day | ORAL | Status: DC
  Administered 2024-08-08 – 2024-08-11 (×4): 50 mg via ORAL
  Filled 2024-08-08 (×5): qty 1

## 2024-08-08 NOTE — Plan of Care (Signed)
   Problem: Education: Goal: Knowledge of [place] [facility] Education information/materials will improve Outcome: Progressing Goal: Emotional status will improve Outcome: Progressing Goal: Mental status will improve Outcome: Progressing
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
## 2024-08-08 NOTE — Group Note (Signed)
 Date:  08/08/2024 Time:  3:54 PM  Group Topic/Focus:   Overcoming Stress:   The focus of this group is to introduce and practice Progressive Muscle Relaxation, a technique that helps reduce physical tension and alleviate stress and anger.    Participation Level:  Minimal  Participation Quality:  Appropriate  Affect:  Appropriate  Cognitive:  Appropriate  Insight: Appropriate  Engagement in Group:  Developing/Improving  Modes of Intervention:  Activity  Additional Comments:    [name] Cameo Schmiesing 08/08/2024, 3:54 PM

## 2024-08-08 NOTE — [facility] Group Notes (Signed)
 [facility] Group Notes:  (Nursing/MHT/Case Management/Adjunct)  Date:  08/08/2024  Time:  8:17 PM  Type of Therapy:  NA Group  Participation Level:  Active  Participation Quality:  Appropriate  Affect:  Appropriate  Cognitive:  Appropriate  Insight:  Appropriate  Engagement in Group:  Engaged  Modes of Intervention:  Education  Summary of Progress/Problems: Attended NA Group.  Grayce LITTIE Essex 08/08/2024, 8:17 PM
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
## 2024-08-08 NOTE — Progress Notes (Signed)
 Spirituality group facilitated by Elia Rockie Sofia, B[facility].  Group Description: Group focused on topic of hope. Patients participated in facilitated discussion around topic, connecting with one another around experiences and definitions for hope. Group members engaged with visual explorer photos, reflecting on what hope looks like for them today. Group engaged in discussion around how their definitions of hope are present today in hospital.  Modalities: Psycho-social ed, Adlerian, Narrative, MI  Patient Progress: [name] attended group and actively engaged and participated in group conversation and activities.
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
## 2024-08-08 NOTE — Plan of Care (Signed)
 Pt was out in the milieu during the day acting appropriately. Went to [employer] and ate adequately. Attending group activity and participated. Denies SI/HI/SH/AVH. Did endorse paranoia. Will continue to monitor.

## 2024-08-08 NOTE — Group Note (Signed)
 Date:  08/08/2024 Time:  1:43 PM  Group Topic/Focus:   Spiritual Wellness (Chaplain)   Pt did attend spiritual wellness group    Zyan Coby D Trena Dunavan 08/08/2024, 1:43 PM

## 2024-08-08 NOTE — Group Note (Signed)
 Date:  08/08/2024 Time:  1:25 PM  Group Topic/Focus:   Pharmacy Group  Pt did attend pharmacy group   [name] [name] 08/08/2024, 1:25 PM

## 2024-08-08 NOTE — Group Note (Signed)
 Date:  08/08/2024 Time:  6:35 PM  Group Topic/Focus:  Goals Group:   The focus of this group is to help patients establish daily goals to achieve during treatment and discuss how the patient can incorporate goal setting into their daily lives to aide in recovery. Orientation:   The focus of this group is to educate the patient on the purpose and policies of crisis stabilization and provide a format to answer questions about their admission.  The group details unit policies and expectations of patients while admitted.    Participation Level:  Did Not Attend   [name] 08/08/2024, 6:35 PM

## 2024-08-08 NOTE — Progress Notes (Signed)
(  Sleep Hours) -8  (Any PRNs that were needed, meds refused, or side effects to meds)-Oxy IR 10mg ; hydroxyzine  25mg ; Trazodone  50mg   (Any disturbances and when (visitation, over night)-none  (Concerns raised by the patient)-none  (SI/HI/AVH)-denies all

## 2024-08-09 MED ORDER — HYDROXYZINE HCL 50 MG PO TABS
50.0000 mg | ORAL_TABLET | Freq: Four times a day (QID) | ORAL | Status: DC | PRN
  Administered 2024-08-09 – 2024-08-10 (×4): 50 mg via ORAL
  Filled 2024-08-09 (×5): qty 1

## 2024-08-09 NOTE — Group Note (Signed)
 Recreation Therapy Group Note   Group Topic:Other  Group Date: 08/09/2024 Start Time: 1305 End Time: 1345 Facilitators: Antoinett Dorman-McCall, LRT,CTRS Location: [address] Dayroom   Activity Description/Intervention: Therapeutic Drumming. Patients with peers and staff were given the opportunity to engage in a leader facilitated [facility] Group Empowerment Drumming Circle with staff from the [employer], in partnership with [employer]. Teaching laboratory technician and trained [place] [employer], Norleen Mon leading with LRT observing and documenting intervention and pt response. This evidenced-based practice targets 7 areas of health and wellbeing in the human experience including: stress-reduction, exercise, self-expression, camaraderie/support, nurturing, spirituality, and music-making (leisure).   Goal Area(s) Addresses:  Patient will engage in pro-social way in music group.  Patient will follow directions of drum leader on the first prompt. Patient will demonstrate no behavioral issues during group.  Patient will identify if a reduction in stress level occurs as a result of participation in therapeutic drum circle.    Education: Leisure exposure, Pharmacologist, Musical expression, Discharge Planning   Affect/Mood: N/A   Participation Level: Did not attend     Clinical Observations/Individualized Feedback:      Plan: Continue to engage patient in RT group sessions 2-3x/week.   Gesselle Fitzsimons-McCall, LRT,CTRS 08/09/2024 3:22 PM

## 2024-08-09 NOTE — Plan of Care (Signed)
 Pt was out in the milieu during the day acting appropriately. Went to [employer] and ate adequately. Attending group activity and participated. Denies SI/HI/SH/paranoia/AVH. Will continue to monitor.

## 2024-08-09 NOTE — Plan of Care (Signed)
   Problem: Education: Goal: Knowledge of [place] [facility] Education information/materials will improve Outcome: Progressing Goal: Emotional status will improve Outcome: Progressing Goal: Mental status will improve Outcome: Progressing

## 2024-08-09 NOTE — Progress Notes (Signed)
(  Sleep Hours) -8  (Any PRNs that were needed, meds refused, or side effects to meds)-hydroxyzine  50mg ; Trazodone  50mg ; Oxy IR 10mg   (Any disturbances and when (visitation, over night)-none  (Concerns raised by the patient)- none  (SI/HI/AVH)-denies all

## 2024-08-09 NOTE — Group Note (Signed)
 Date:  08/09/2024 Time:  2:21 PM  Group Topic/Focus:  Patients participated in a psychoeducational and process-oriented group focused on grief. Group members were encouraged to identify personal experiences of loss, discuss emotional responses related to grief, and explore how grief has impacted their daily functioning. Patients practiced expressing thoughts and feelings in a supportive group setting and identified healthy coping strategies and support systems related to the grieving process. Participation included listening to peers, sharing as able, and engaging in guided discussion.    Participation Level:  Active   Rodger Giangregorio 08/09/2024, 2:21 PM

## 2024-08-09 NOTE — Group Note (Signed)
 LCSW Group Therapy Note   Group Date: 08/09/2024 Start Time: 1100 End Time: 1200   Participation:  patient was present.  She listened and was respectful but didn't participate in the discussion.  Type of Therapy:  Group Therapy  Title:  Healing Hearts:  A Safe Space for Grief  Objective:   The objective of this class, Healing Hearts:  A Safe Space for Grief, is to create a compassionate environment where participants can process their grief, explore different stages of grief, and discover ways to honor their loved ones through personal rituals.  3 Goals: Provide a safe and supportive space where participants feel comfortable sharing their feelings and experiences of grief without judgment. Educate participants about the stages of grief and emphasize that there is no right way to grieve or a fixed timeline for healing. Introduce the concept of rituals as a means to process grief, allowing individuals to honor their loved ones in a personal and meaningful way.  Summary:  In Healing Hearts: A Safe Space for Grief, we explored the unique and personal journey of grief, emphasizing that everyone experiences it differently.  We discussed the five stages of grief (denial, anger, bargaining, depression, and acceptance), with the understanding that grief is not linear.  Rituals were introduced as a way to help cope with loss, offering comfort and connection through meaningful actions such as lighting candles or taking memory walks. Participants were encouraged to express their emotions, focus on self-care, and reflect on moments of gratitude for their loved ones, recognizing that healing is a process and there is no timeline for grief.  Therapeutic Modalities: Elements of CBT: Challenge thoughts, reframe beliefs, self-compassion Elements of DBT: Mindfulness, distress tolerance, emotion regulation Supportive Therapy:  Provide validation, foster a safe and supportive group environment, normalize  grief   [name], LCSWA 08/09/2024  12:20 PM
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
## 2024-08-09 NOTE — Group Note (Signed)
 Date:  08/09/2024 Time:  10:36 AM  Group Topic/Focus:  Making Healthy Choices:   The focus of this group is to help patients identify negative/unhealthy choices they were using prior to admission and identify positive/healthier coping strategies to replace them upon discharge. Wellness Toolbox:   The focus of this group is to discuss various aspects of wellness, balancing those aspects and exploring ways to increase the ability to experience wellness.  Patients will create a wellness toolbox for use upon discharge. Nutrition: How to make healthier choices.     Participation Level:  Active   [name] 08/09/2024, 10:36 AM

## 2024-08-09 NOTE — Group Note (Signed)
 Date:  08/09/2024 Time:  2:37 PM  Group Topic/Focus:  Emotional Education:   The focus of this group is to discuss what feelings/emotions are, and how they are experienced. Wellness Toolbox:   The focus of this group is to discuss various aspects of wellness, balancing those aspects and exploring ways to increase the ability to experience wellness.  Patients will create a wellness toolbox for use upon discharge.    Participation Level:  Active  Sulamita Lafountain 08/09/2024, 2:37 PM

## 2024-08-09 NOTE — [facility] Suicide Risk Assessment (Signed)
 [facility] INPATIENT:  Family/Significant Other Suicide Prevention Education  Suicide Prevention Education:  Education Completed; Adriana Caul (s/o) [phone],   has been identified by the patient as the family member/significant other with whom the patient will be residing, and identified as the person(s) who will aid the patient in the event of a mental health crisis (suicidal ideations/suicide attempt).  With written consent from the patient, the family member/significant other has been provided the following suicide prevention education, prior to the and/or following the discharge of the patient.  The suicide prevention education provided includes the following: Suicide risk factors Suicide prevention and interventions National Suicide Hotline telephone number [facility] assessment telephone number [facility] Emergency Assistance 911 [facility] and/or Residential Mobile Crisis Unit telephone number  Request made of family/significant other to: Remove weapons (e.g., guns, rifles, knives), all items previously/currently identified as safety concern.   Remove drugs/medications (over-the-counter, prescriptions, illicit drugs), all items previously/currently identified as a safety concern.  The family member/significant other verbalizes understanding of the suicide prevention education information provided.  The family member/significant other agrees to remove the items of safety concern listed above.  Roselyn GORMAN Lento 08/09/2024, 11:17 AM
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
## 2024-08-09 NOTE — Group Note (Signed)
 Occupational Therapy Group Note  Group Topic: Sleep Hygiene  Group Date: 08/09/2024 Start Time: 1500 End Time: 1530 Facilitators: Dot Dallas MATSU, OT   Group Description: Group encouraged increased participation and engagement through topic focused on sleep hygiene. Patients reflected on the quality of sleep they typically receive and identified areas that need improvement. Group was given background information on sleep and sleep hygiene, including common sleep disorders. Group members also received information on how to improve ones sleep and introduced a sleep diary as a tool that can be utilized to track sleep quality over a length of time. Group session ended with patients identifying one or more strategies they could utilize or implement into their sleep routine in order to improve overall sleep quality.        Therapeutic Goal(s):  Identify one or more strategies to improve overall sleep hygiene  Identify one or more areas of sleep that are negatively impacted (sleep too much, too little, etc)     Participation Level: Engaged   Participation Quality: Independent   Behavior: Appropriate   Speech/Thought Process: Relevant   Affect/Mood: Appropriate   Insight: Fair   Judgement: Fair      Modes of Intervention: Education  Patient Response to Interventions:  Attentive   Plan: Continue to engage patient in OT groups 2 - 3x/week.  08/09/2024  [place] MATSU Dot, OT  Nyles Mitton, OT

## 2024-08-09 NOTE — Group Note (Signed)
 Date:  08/09/2024 Time:  9:12 AM  Group Topic/Focus:  Goals Group:   The focus of this group is to help patients establish daily goals to achieve during treatment and discuss how the patient can incorporate goal setting into their daily lives to aide in recovery. Orientation:   The focus of this group is to educate the patient on the purpose and policies of crisis stabilization and provide a format to answer questions about their admission.  The group details unit policies and expectations of patients while admitted.    Participation Level:  Active  Participation Quality:  Appropriate  Affect:  Appropriate  Cognitive:  Appropriate  Insight: Appropriate  Engagement in Group:  Engaged  Modes of Intervention:  Activity  Additional Comments:  pt attended group   [name] 08/09/2024, 9:12 AM

## 2024-08-09 NOTE — [facility] Group Notes (Signed)
 [facility] Group Notes:  (Nursing/MHT/Case Management/Adjunct)  Date:  08/09/2024  Time:  10:41 PM  Type of Therapy:  Wrap up group  Participation Level:  Active  Participation Quality:  Appropriate  Affect:  Appropriate  Cognitive:  Appropriate  Insight:  Appropriate  Engagement in Group:  Engaged  Modes of Intervention:  Education  Summary of Progress/Problems: Goal to attend groups, Met. Rated day 8/10.  [name] 08/09/2024, 10:41 PM

## 2024-08-10 NOTE — Group Note (Signed)
 Date:  08/10/2024 Time:  5:07 PM  Group Topic/Focus:  Emotional Hygiene Video Group - Description Patients attended a psychoeducational group focused on emotional hygiene. The group involved viewing a mental health-related video addressing emotional awareness, coping strategies, and the importance of maintaining emotional well-being. Patients were encouraged to reflect on the content and its relevance to daily functioning and recovery. The group provided education in a structured and supportive environment.    Participation Level:  Active  Participation Quality:  Appropriate  Affect:  Appropriate  Cognitive:  Appropriate  Insight: Appropriate  Engagement in Group:  Engaged  Modes of Intervention:  Activity  Additional Comments:  Pt attended and participated in group.   Andres Bantz 08/10/2024, 5:07 PM

## 2024-08-10 NOTE — Group Note (Signed)
 Date:  08/10/2024 Time:  8:48 PM  Group Topic/Focus:  Wrap-Up Group:   The focus of this group is to help patients review their daily goal of treatment and discuss progress on daily workbooks.    Participation Level:  Did Not Attend  Participation Quality:  none  Affect:  n/a  Cognitive:  n/a  Insight: None  Engagement in Group:  None  Modes of Intervention:  none  Additional Comments:   Pt did not attend AA meeting  [name] A Normalee Sistare 08/10/2024, 8:48 PM

## 2024-08-10 NOTE — Plan of Care (Signed)
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
## 2024-08-10 NOTE — Group Note (Signed)
 Date:  08/10/2024 Time:  12:58 PM  Group Topic/Focus:  Group Description: The Recreation Therapist facilitated a Social Wellness group focused on increasing awareness of healthy social interactions, communication, boundaries, and shared experiences. Patients participated in an interactive activity that encouraged self-reflection, peer engagement, and recognition of common social challenges. The group promoted connection, respect, and appropriate social behaviors in a structured and supportive environment.   Participation Level:  Active  Participation Quality:  Appropriate  Affect:  Appropriate  Cognitive:  Appropriate  Insight: Appropriate  Engagement in Group:  Engaged  Modes of Intervention:  Activity  Additional Comments:  Pt attended and participated.  [name] 08/10/2024, 12:58 PM

## 2024-08-10 NOTE — Group Note (Signed)
 Date:  08/10/2024 Time:  4:21 PM  Group Topic/Focus:  Coping With Mental Health Crisis:   The purpose of this group is to help patients identify strategies for coping with mental health crisis.  Group discusses possible causes of crisis and ways to manage them effectively.    Participation Level:  Active  Participation Quality:  Appropriate  Affect:  Appropriate  Cognitive:  Appropriate  Insight: Appropriate  Engagement in Group:  Engaged  Modes of Intervention:  Activity  Additional Comments:    [name] 08/10/2024, 4:21 PM

## 2024-08-10 NOTE — Plan of Care (Signed)
   Problem: Education: Goal: Knowledge of [place] Education information/materials will improve Outcome: Progressing Goal: Emotional status will improve Outcome: Progressing Goal: Mental status will improve Outcome: Progressing Goal: Verbalization of understanding the information provided will improve Outcome: Progressing

## 2024-08-10 NOTE — Group Note (Signed)
 Date:  08/10/2024 Time:  10:30 AM  Group Topic/Focus:  The Recreation Therapist facilitated a therapeutic activity group focused on promoting social interaction, emotional regulation, and positive leisure skills. Patients were encouraged to participate at their own comfort level while practicing appropriate communication, cooperation, and coping strategies. The activity provided an opportunity for stress reduction, engagement, and expression in a structured and supportive environment.  Participation Level:  Active  [name] 08/10/2024, 10:30 AM

## 2024-08-10 NOTE — Group Note (Signed)
 Recreation Therapy Group Note   Group Topic:General Recreation  Group Date: 08/10/2024 Start Time: 0930 End Time: 1010 Facilitators: Amberleigh Gerken-McCall, LRT,CTRS Location: [address] Day[address]   Group Topic/Focus: General Recreation   Goal Area(s) Addresses:  Patient will use appropriate interactions in play with peers.   Patient will appropriately follow the rules of the activity.  Behavioral Response: Engaged   Intervention: Competitive Play  Activity: Grab the Mic. LRT flips over a card with a word on it and the patient had to think of a lyric with that word in it. Each card has a number in the corner that represents the points for that word. The person who could think of lyric, races to the mic, picks it up and sings the lyrics. The person with the most points wins the game.     Affect/Mood: Appropriate   Participation Level: Engaged   Participation Quality: Independent   Behavior: Appropriate   Speech/Thought Process: Focused   Insight: Good   Judgement: Good   Modes of Intervention: Competitive Play   Patient Response to Interventions:  Engaged   Education Outcome:  In group clarification offered    Clinical Observations/Individualized Feedback: Pt came in late to group. Pt listened for the a minute to get an understanding of the activity. Once pt learned the rules, she was engaged, open and attentive during activity.     Plan: Continue to engage patient in RT group sessions 2-3x/week.   Kennedie Pardoe-McCall, LRT,CTRS 08/10/2024 12:25 PM
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
## 2024-08-10 NOTE — Group Note (Signed)
 Date:  08/10/2024 Time:  9:15 AM  Group Topic/Focus:  Goals Group:   The focus of this group is to help patients establish daily goals to achieve during treatment and discuss how the patient can incorporate goal setting into their daily lives to aide in recovery. Orientation:   The focus of this group is to educate the patient on the purpose and policies of crisis stabilization and provide a format to answer questions about their admission.  The group details unit policies and expectations of patients while admitted.    Participation Level:  Did Not Attend   Marolyn Urschel 08/10/2024, 9:15 AM

## 2024-08-10 NOTE — Progress Notes (Signed)
" °   08/10/24 1000  Psych Admission Type (Psych Patients Only)  Admission Status Voluntary  Psychosocial Assessment  Patient Complaints Anxiety  Eye Contact Fair  Facial Expression Animated;Anxious  Affect Anxious  Speech Soft  Interaction Assertive  Motor Activity Slow  Appearance/Hygiene Unremarkable  Behavior Characteristics Cooperative  Mood Anxious;Pleasant  Thought Process  Coherency WDL  Content WDL  Delusions None reported or observed  Perception WDL  Hallucination None reported or observed  Judgment Impaired  Confusion None  Danger to Self  Current suicidal ideation? Denies  Description of Suicide Plan No Plan  Agreement Not to Harm Self Yes  Description of Agreement Verbal  Danger to Others  Danger to Others None reported or observed    "

## 2024-08-11 ENCOUNTER — Other Ambulatory Visit (HOSPITAL_COMMUNITY): Payer: Self-pay | Admitting: Student

## 2024-08-11 DIAGNOSIS — F1390 Sedative, hypnotic, or anxiolytic use, unspecified, uncomplicated: Secondary | ICD-10-CM

## 2024-08-11 DIAGNOSIS — F411 Generalized anxiety disorder: Secondary | ICD-10-CM

## 2024-08-11 DIAGNOSIS — F4321 Adjustment disorder with depressed mood: Secondary | ICD-10-CM

## 2024-08-11 DIAGNOSIS — F319 Bipolar disorder, unspecified: Secondary | ICD-10-CM

## 2024-08-11 DIAGNOSIS — I10 Essential (primary) hypertension: Secondary | ICD-10-CM

## 2024-08-11 DIAGNOSIS — F1120 Opioid dependence, uncomplicated: Secondary | ICD-10-CM

## 2024-08-11 DIAGNOSIS — F1510 Other stimulant abuse, uncomplicated: Secondary | ICD-10-CM

## 2024-08-11 DIAGNOSIS — G8929 Other chronic pain: Secondary | ICD-10-CM

## 2024-08-11 DIAGNOSIS — F4001 Agoraphobia with panic disorder: Secondary | ICD-10-CM

## 2024-08-11 DIAGNOSIS — F4320 Adjustment disorder, unspecified: Secondary | ICD-10-CM

## 2024-08-11 DIAGNOSIS — F1320 Sedative, hypnotic or anxiolytic dependence, uncomplicated: Secondary | ICD-10-CM

## 2024-08-11 DIAGNOSIS — F332 Major depressive disorder, recurrent severe without psychotic features: Principal | ICD-10-CM

## 2024-08-11 MED ORDER — LISINOPRIL 5 MG PO TABS: 5.0000 mg | ORAL_TABLET | Freq: Every day | ORAL | 0 refills | Status: DC

## 2024-08-11 MED ORDER — NICOTINE 14 MG/24HR TD PT24: 14.0000 mg | MEDICATED_PATCH | Freq: Every day | TRANSDERMAL | 0 refills | Status: AC

## 2024-08-11 MED ORDER — SERTRALINE HCL 50 MG PO TABS: 50.0000 mg | ORAL_TABLET | Freq: Every day | ORAL | 0 refills | Status: AC

## 2024-08-11 MED ORDER — TRAZODONE HCL 50 MG PO TABS: 50.0000 mg | ORAL_TABLET | Freq: Every evening | ORAL | 0 refills | Status: AC | PRN

## 2024-08-11 MED ORDER — HALOPERIDOL 1 MG PO TABS: 1.0000 mg | ORAL_TABLET | Freq: Two times a day (BID) | ORAL | 0 refills | Status: AC

## 2024-08-11 NOTE — Progress Notes (Signed)
 Discharge Note:  patient discharged home with transportation.  Patient denied SI and HI.  Denied A/V hallucinations.  Patient stated she appreciated all assistance received from [facility] staff.  All required discharge information given patient.

## 2024-08-11 NOTE — Plan of Care (Signed)
 Nurse discussed anxiety, depression and coping skills with patient.
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
## 2024-08-11 NOTE — Progress Notes (Addendum)
" °  [facility] Adult Case Management Discharge Plan :  Will you be returning to the same living situation after discharge:  Yes,  pt will return home  At discharge, do you have transportation home?: Yes,  CSW will assist with transportation  Do you have the ability to pay for your medications: Yes,  [employer] TAILORED PLAN / [employer] TAILORED PLAN  Release of information consent forms completed and in the chart;  Patient's signature needed at discharge.  Patient to Follow up at:  Follow-up Information     [facility]. Go on 09/06/2024.   Specialty: Behavioral Health Why: You have an appointment for therapy services on  09/06/24 at 10:00 am.  This appt will be Virtual. Contact information: [address] [place]  [address] [phone]        [facility] Follow up.   Specialty: Professional Counselor Why: You may also go to this provider to register for services.  At this time, you will be scheduled for a clinical assessment, in order to obtain a therapy appointment.   You may also go Monday thru Friday, from 9 am to 1 pm to register for services. Contact information: [address] [place]  [address] [place] [address] [phone]         [facility] Follow up on 08/21/2024.   Why: You have an appointment for psychiatric medication management services on 08/21/24 at 9:15 am, in person ([address], [place], [place]). (Contact: please use ext. 1,2,3)  You also have an appointment with your pain management provider on 09/04/24 at 12:00 pm (at the [facility] location) , in person. Contact information: [address] [place] [address] [phone]                 Next level of care provider has access to [facility] Link:no  Safety Planning and Suicide Prevention discussed: Yes,  Adriana Caul (s/o) [phone]     Has patient been referred to the Quitline?: Patient refused referral for  treatment  Patient has been referred for addiction treatment: No known substance use disorder. Pt denies substance use to CSW but hx is reported in medical record  Lum JONETTA Croft, LCSWA 08/11/2024, 9:30 AM "

## 2024-08-11 NOTE — Hospital Course (Addendum)
 Reason for Admission:  [name] is a 49 year old female with a past psychiatric history of bipolar disorder, major depressive disorder, sedative dependence, panic disorder with agoraphobia who presented to [facility] 08/07/24 for suicidal ideation with a plan to overdose on pills and admitted to [facility] for further psychiatric stabilization.   Hospital Course  During the patient's hospitalization, patient had extensive initial psychiatric evaluation, and follow-up psychiatric evaluations every day.  Psychiatric diagnoses provided upon initial assessment:  Adjustment Disorder Sedative Use Disorder Chronic Pain Syndrome GAD   Patient's psychiatric medications were adjusted on admission:  -Start haldol  1 mg bid -start cogentin  1 mg bid for EPS -start sertraline  50 mg daily for depression -hydroxyzine  25 mg tid prn for anxiety -decreased trazodone  to 50 mg at bedtime prn for insomnia  During the hospitalization, other adjustments were made to the patient's psychiatric medication regimen:  -increased hydroxyzine  to 50 mg q6h prn for anxiety  Patient's care was discussed during the interdisciplinary team meeting every day during the hospitalization.  The patient denies having side effects to prescribed psychiatric medication.  Gradually, patient started adjusting to milieu. The patient was evaluated each day by a clinical provider to ascertain response to treatment. Improvement was noted by the patient's report of decreasing symptoms, improved sleep and appetite, affect, medication tolerance, behavior, and participation in unit programming.  Patient was asked each day to complete a self inventory noting mood, mental status, pain, new symptoms, anxiety and concerns.    Symptoms were reported as significantly decreased or resolved completely by discharge.   On day of discharge, the patient reports that their mood is stable. The patient denied having suicidal thoughts for more than 48 hours prior  to discharge.  Patient denies having homicidal thoughts.  Patient denies having auditory hallucinations.  Patient denies any visual hallucinations or other symptoms of psychosis. The patient was motivated to continue taking medication with a goal of continued improvement in mental health.   The patient reports their target psychiatric symptoms of depression responded well to the psychiatric medications, and the patient reports overall benefit other psychiatric hospitalization. Supportive psychotherapy was provided to the patient. The patient also participated in regular group therapy while hospitalized. Coping skills, problem solving as well as relaxation therapies were also part of the unit programming.  Labs were reviewed with the patient, and abnormal results were discussed with the patient.  The patient is able to verbalize their individual safety plan to this provider.  # It is recommended to the patient to continue psychiatric medications as prescribed, after discharge from the hospital.    # It is recommended to the patient to follow up with your outpatient psychiatric provider and PCP.  # It was discussed with the patient, the impact of alcohol, drugs, tobacco have been there overall psychiatric and medical wellbeing, and total abstinence from substance use was recommended the patient.ed.  # Prescriptions provided or sent directly to preferred pharmacy at discharge. Patient agreeable to plan. Given opportunity to ask questions. Appears to feel comfortable with discharge.    # In the event of worsening symptoms, the patient is instructed to call the crisis hotline, 911 and or go to the nearest ED for appropriate evaluation and treatment of symptoms. To follow-up with primary care provider for other medical issues, concerns and or health care needs  # Patient was discharged home with a plan to follow up as noted below.

## 2024-08-11 NOTE — Progress Notes (Signed)
(  Sleep Hours) -7.5 as of 0530 (Any PRNs that were needed, meds refused, or side effects to meds)- prn hydroxyzine , oxycodone , and trazodone  @ 2115 (Any disturbances and when (visitation, over night)-none (Concerns raised by the patient)- none (SI/HI/AVH)- denies all

## 2024-08-11 NOTE — Progress Notes (Signed)
 D:  Patient's self inventory sheet, patient sleeps good, sleep medication helpful.  Good appetite, normal energy level, good concentration.  Rated depression 3, hopeless 4, anxiety 8.  Denied withdrawals.  Denied SI.  Physical problems, pain back, worst pain #7 in past 24 hours.  Pain medication is helpful.  Goal is discharge.  Plans to finish paper work.  Does have discharge plans. A:  Medications administered per MD orders.  Emotional support and encouragement given patient. R:  Denied SI and HI, contracts for safety.  Denied A/V hallucinations.  Safety maintained with 15 minute checks.

## 2024-08-11 NOTE — Group Note (Signed)
 Date:  08/11/2024 Time:  9:14 AM  Group Topic/Focus: Goals Group  Patients participated in an research scientist (life sciences) by sharing their favorite foods and their feelings about the weekend snow. Patients then completed worksheets focused on developing SMART goals and were encouraged to share recovery-related goals with the group. The group promoted positive social interaction, engagement, and provided a supportive environment for safe self-expression and vulnerability.   Participation Level:  Active  Participation Quality:  Appropriate, Attentive, and Sharing  Affect:  Appropriate  Cognitive:  Alert and Appropriate  Insight: Good and Improving  Engagement in Group:  Engaged and Improving  Modes of Intervention:  Discussion, Exploration, Socialization, and Support  Additional Comments:  Pt attended and actively participated in goals group  Kristi HERO [facility] 08/11/2024, 9:14 AM

## 2024-08-11 NOTE — [facility] Suicide Risk Assessment (Addendum)
 [facility] Discharge Suicide Risk Assessment   Principal Problem: Major depressive disorder, recurrent episode, severe (HCC) Discharge Diagnoses: Principal Problem:   Major depressive disorder, recurrent episode, severe (HCC) Active Problems:   Generalized anxiety disorder   GERD (gastroesophageal reflux disease)   Opioid dependence (HCC)   Bipolar I disorder, most recent episode depressed (HCC)   Adjustment disorder with depressed mood   Sedative dependence (HCC)   Panic disorder with agoraphobia   Methamphetamine abuse (HCC)   Reason for Admission:  [name] is a 49 year old female with a past psychiatric history of bipolar disorder, major depressive disorder, sedative dependence, panic disorder with agoraphobia who presented to [facility] 08/07/24 for suicidal ideation with a plan to overdose on pills and admitted to [facility] for further psychiatric stabilization.   Hospital Course  During the patient's hospitalization, patient had extensive initial psychiatric evaluation, and follow-up psychiatric evaluations every day.  Psychiatric diagnoses provided upon initial assessment:  Adjustment Disorder Sedative Use Disorder Chronic Pain Syndrome GAD   Patient's psychiatric medications were adjusted on admission:  -Start haldol  1 mg bid -start cogentin  1 mg bid for EPS -start sertraline  50 mg daily for depression -hydroxyzine  25 mg tid prn for anxiety -decreased trazodone  to 50 mg at bedtime prn for insomnia  During the hospitalization, other adjustments were made to the patient's psychiatric medication regimen:  -increased hydroxyzine  to 50 mg q6h prn for anxiety  Patient's care was discussed during the interdisciplinary team meeting every day during the hospitalization.  The patient denies having side effects to prescribed psychiatric medication.  Gradually, patient started adjusting to milieu. The patient was evaluated each day by a clinical provider to ascertain response to  treatment. Improvement was noted by the patient's report of decreasing symptoms, improved sleep and appetite, affect, medication tolerance, behavior, and participation in unit programming.  Patient was asked each day to complete a self inventory noting mood, mental status, pain, new symptoms, anxiety and concerns.    Symptoms were reported as significantly decreased or resolved completely by discharge.   On day of discharge, the patient reports that their mood is stable. The patient denied having suicidal thoughts for more than 48 hours prior to discharge.  Patient denies having homicidal thoughts.  Patient denies having auditory hallucinations.  Patient denies any visual hallucinations or other symptoms of psychosis. The patient was motivated to continue taking medication with a goal of continued improvement in mental health.   The patient reports their target psychiatric symptoms of depression responded well to the psychiatric medications, and the patient reports overall benefit other psychiatric hospitalization. Supportive psychotherapy was provided to the patient. The patient also participated in regular group therapy while hospitalized. Coping skills, problem solving as well as relaxation therapies were also part of the unit programming.  Labs were reviewed with the patient, and abnormal results were discussed with the patient.  The patient is able to verbalize their individual safety plan to this provider.  # It is recommended to the patient to continue psychiatric medications as prescribed, after discharge from the hospital.    # It is recommended to the patient to follow up with your outpatient psychiatric provider and PCP.  # It was discussed with the patient, the impact of alcohol, drugs, tobacco have been there overall psychiatric and medical wellbeing, and total abstinence from substance use was recommended the patient.ed.  # Prescriptions provided or sent directly to preferred pharmacy  at discharge. Patient agreeable to plan. Given opportunity to ask questions. Appears to feel comfortable  with discharge.    # In the event of worsening symptoms, the patient is instructed to call the crisis hotline, 911 and or go to the nearest ED for appropriate evaluation and treatment of symptoms. To follow-up with primary care provider for other medical issues, concerns and or health care needs  # Patient was discharged home with a plan to follow up as noted below.   Total Time spent with patient: 1 hour  Musculoskeletal: Strength & Muscle Tone: within normal limits Gait & Station: normal Patient leans: N/A  Psychiatric Specialty Exam  Presentation  General Appearance: Appropriate for Environment; Casual; Fairly Groomed   Eye Contact:Good   Speech:Clear and Coherent   Speech Volume:Normal     Mood and Affect  Mood:-- (Improving.)   Affect:Congruent    Thought Process  Thought Processes:Coherent; Goal Directed; Linear   Descriptions of Associations:Intact   Orientation:Full (Time, Place and Person)   Thought Content:Logical   Hallucinations:No data recorded Ideas of Reference:None   Suicidal Thoughts:No data recorded Homicidal Thoughts:No data recorded  Sensorium  Memory:Immediate Good; Recent Good; Remote Good   Judgment:Fair   Insight:Fair    Executive Functions  Concentration:Good   Attention Span:Good   Recall:Good   Fund of Knowledge:Fair   Language:Good    Psychomotor Activity  Psychomotor Activity:No data recorded  Assets  Assets:Communication Skills; Desire for Improvement; Housing; Resilience; Physical Health    Sleep  Sleep:No data recorded  Blood pressure 138/76, pulse 70, temperature 98.4 F (36.9 C), temperature source Oral, resp. rate 20, height 5' 3 (1.6 m), weight 71.2 kg, last menstrual period 06/27/2024, SpO2 98%. Body mass index is 27.81 kg/m.  Mental Status Per Nursing Assessment::   On  Admission:  Suicidal ideation indicated by patient, Self-harm thoughts, Self-harm behaviors  Demographic Factors:  Caucasian and Gay, lesbian, or bisexual orientation  Loss Factors: NA  Historical Factors: Impulsivity  Risk Reduction Factors:   Positive social support, Positive therapeutic relationship, and Positive coping skills or problem solving skills  Continued Clinical Symptoms:  Severe Anxiety and/or Agitation More than one psychiatric diagnosis Previous Psychiatric Diagnoses and Treatments  Cognitive Features That Contribute To Risk:  None    Suicide Risk:  Minimal: No identifiable suicidal ideation.  Patients presenting with no risk factors but with morbid ruminations; may be classified as minimal risk based on the severity of the depressive symptoms   Follow-up Information     [facility]. Go on 09/06/2024.   Specialty: Behavioral Health Why: You have an appointment for therapy services on  09/06/24 at 10:00 am.  This appt will be Virtual. Contact information: [address] [place]  [address] [phone]        [facility] Follow up.   Specialty: Professional Counselor Why: You may also go to this provider to register for services.  At this time, you will be scheduled for a clinical assessment, in order to obtain a therapy appointment.   You may also go Monday thru Friday, from 9 am to 1 pm to register for services. Contact information: [address] [place]  [address] [place] [address] [phone]         [facility] Follow up on 08/21/2024.   Why: You have an appointment for psychiatric medication management services on 08/21/24 at 9:15 am, in person ([address], [place], [place]). (Contact: please use ext. [phone])  You also have an appointment with your pain management provider on 09/04/24 at 12:00 pm (at the  [place] location) , in person. Contact information: [address] [place] [address] [phone]                 Follow-up recommendations:   Activity:  as tolerated Diet:  heart healthy   Comments:  Prescriptions were given at discharge.  Patient is agreeable with the discharge plan.  Patient was given an opportunity to ask questions.  Patient appears to feel comfortable with discharge and denies any current suicidal or homicidal thoughts.    Patient is instructed prior to discharge to: Take all medications as prescribed by mental healthcare provider. Report any adverse effects and or reactions from the medicines to outpatient provider promptly. In the event of worsening symptoms, patient is instructed to call the crisis hotline, 911 and or go to the nearest ED for appropriate evaluation and treatment of symptoms. Patient is to follow-up with primary care provider for other medical issues, concerns and or health care needs.    Prentice Espy, MD 08/11/2024, 9:19 AM

## 2024-08-11 NOTE — Group Note (Signed)
 Date:  08/11/2024 Time:  9:57 AM  Group Topic/Focus: Social Wellness  Patients participated in a charades activity designed to promote creativity, critical thinking, and peer interaction. The activity encouraged communication and engagement among group members. During the final 10 minutes of group, patients socialized with one another, further supporting socialization and interpersonal skills.    Participation Level:  Minimal  Participation Quality:  Appropriate and Attentive  Affect:  Appropriate  Cognitive:  Alert and Appropriate  Insight: Good and Improving  Engagement in Group:  Engaged and Improving  Modes of Intervention:  Activity, Exploration, Role-play, and Socialization  Additional Comments:  Pt attended group during the last 15 min. Pt declined to act out charades but took part in guessing  Kristi CHRISTELLA Plaza 08/11/2024, 9:57 AM

## 2024-08-20 ENCOUNTER — Ambulatory Visit (HOSPITAL_COMMUNITY): Payer: MEDICAID | Admitting: Psychiatry

## 2024-09-06 ENCOUNTER — Ambulatory Visit (HOSPITAL_COMMUNITY): Payer: MEDICAID
